# Patient Record
Sex: Male | Born: 1935
Health system: Southern US, Community
[De-identification: ages and names within clinical notes are randomized; demographics above are authoritative.]

## PROBLEM LIST (undated history)

## (undated) DIAGNOSIS — S065X9A Traumatic subdural hemorrhage with loss of consciousness of unspecified duration, initial encounter: Secondary | ICD-10-CM

## (undated) DIAGNOSIS — W19XXXA Unspecified fall, initial encounter: Secondary | ICD-10-CM

## (undated) DIAGNOSIS — K921 Melena: Secondary | ICD-10-CM

## (undated) DIAGNOSIS — C801 Malignant (primary) neoplasm, unspecified: Secondary | ICD-10-CM

## (undated) DIAGNOSIS — E039 Hypothyroidism, unspecified: Secondary | ICD-10-CM

## (undated) DIAGNOSIS — Z8546 Personal history of malignant neoplasm of prostate: Secondary | ICD-10-CM

## (undated) DIAGNOSIS — E785 Hyperlipidemia, unspecified: Secondary | ICD-10-CM

## (undated) DIAGNOSIS — H9190 Unspecified hearing loss, unspecified ear: Secondary | ICD-10-CM

## (undated) DIAGNOSIS — I10 Essential (primary) hypertension: Secondary | ICD-10-CM

## (undated) DIAGNOSIS — Z87442 Personal history of urinary calculi: Secondary | ICD-10-CM

## (undated) DIAGNOSIS — I2510 Atherosclerotic heart disease of native coronary artery without angina pectoris: Secondary | ICD-10-CM

## (undated) DIAGNOSIS — Z85828 Personal history of other malignant neoplasm of skin: Secondary | ICD-10-CM

## (undated) DIAGNOSIS — Z8551 Personal history of malignant neoplasm of bladder: Secondary | ICD-10-CM

## (undated) DIAGNOSIS — R0600 Dyspnea, unspecified: Secondary | ICD-10-CM

## (undated) DIAGNOSIS — D649 Anemia, unspecified: Secondary | ICD-10-CM

## (undated) DIAGNOSIS — K250 Acute gastric ulcer with hemorrhage: Secondary | ICD-10-CM

## (undated) HISTORY — DX: Anemia, unspecified: D64.9

## (undated) HISTORY — PX: MOHS SURGERY: SHX181

## (undated) HISTORY — DX: Personal history of malignant neoplasm of prostate: Z85.46

## (undated) HISTORY — DX: Hyperlipidemia, unspecified: E78.5

## (undated) HISTORY — PX: PROSTATE SURGERY: SHX751

## (undated) HISTORY — DX: Acute gastric ulcer with hemorrhage: K25.0

## (undated) HISTORY — DX: Personal history of other malignant neoplasm of skin: Z85.828

## (undated) HISTORY — DX: Essential (primary) hypertension: I10

## (undated) HISTORY — DX: Personal history of malignant neoplasm of bladder: Z85.51

## (undated) HISTORY — PX: HERNIA REPAIR: SHX51

## (undated) HISTORY — PX: CATARACT EXTRACTION: SUR2

## (undated) HISTORY — DX: Atherosclerotic heart disease of native coronary artery without angina pectoris: I25.10

## (undated) HISTORY — DX: Hypothyroidism, unspecified: E03.9

## (undated) HISTORY — DX: Melena: K92.1

## (undated) HISTORY — PX: CORONARY ARTERY BYPASS GRAFT: SHX141

---
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
## 2000-05-27 ENCOUNTER — Encounter (INDEPENDENT_AMBULATORY_CARE_PROVIDER_SITE_OTHER): Payer: Self-pay | Admitting: Specialist

## 2000-05-27 ENCOUNTER — Ambulatory Visit (HOSPITAL_BASED_OUTPATIENT_CLINIC_OR_DEPARTMENT_OTHER): Admission: RE | Admit: 2000-05-27 | Discharge: 2000-05-27 | Payer: Self-pay | Admitting: *Deleted

## 2000-10-27 ENCOUNTER — Ambulatory Visit (HOSPITAL_BASED_OUTPATIENT_CLINIC_OR_DEPARTMENT_OTHER): Admission: RE | Admit: 2000-10-27 | Discharge: 2000-10-27 | Payer: Self-pay | Admitting: *Deleted

## 2000-10-27 ENCOUNTER — Encounter (INDEPENDENT_AMBULATORY_CARE_PROVIDER_SITE_OTHER): Payer: Self-pay | Admitting: Specialist

## 2001-02-11 ENCOUNTER — Ambulatory Visit (HOSPITAL_BASED_OUTPATIENT_CLINIC_OR_DEPARTMENT_OTHER): Admission: RE | Admit: 2001-02-11 | Discharge: 2001-02-11 | Payer: Self-pay | Admitting: *Deleted

## 2001-02-11 ENCOUNTER — Encounter (INDEPENDENT_AMBULATORY_CARE_PROVIDER_SITE_OTHER): Payer: Self-pay | Admitting: Specialist

## 2004-09-30 ENCOUNTER — Ambulatory Visit: Payer: Self-pay | Admitting: Internal Medicine

## 2005-04-30 ENCOUNTER — Ambulatory Visit: Payer: Self-pay | Admitting: Cardiology

## 2005-06-24 ENCOUNTER — Ambulatory Visit: Payer: Self-pay | Admitting: Internal Medicine

## 2005-10-01 ENCOUNTER — Ambulatory Visit: Payer: Self-pay | Admitting: Internal Medicine

## 2005-10-06 ENCOUNTER — Ambulatory Visit: Payer: Self-pay | Admitting: Internal Medicine

## 2006-03-30 ENCOUNTER — Ambulatory Visit: Payer: Self-pay | Admitting: Internal Medicine

## 2006-04-15 ENCOUNTER — Ambulatory Visit: Payer: Self-pay | Admitting: Internal Medicine

## 2006-05-11 ENCOUNTER — Ambulatory Visit: Payer: Self-pay | Admitting: Cardiology

## 2006-05-20 ENCOUNTER — Ambulatory Visit: Payer: Self-pay

## 2006-06-09 ENCOUNTER — Ambulatory Visit: Payer: Self-pay | Admitting: Internal Medicine

## 2006-10-06 ENCOUNTER — Ambulatory Visit: Payer: Self-pay | Admitting: Internal Medicine

## 2006-10-06 LAB — CONVERTED CEMR LAB
ALT: 24 units/L (ref 0–40)
AST: 33 units/L (ref 0–37)
Albumin: 3.7 g/dL (ref 3.5–5.2)
Alkaline Phosphatase: 71 units/L (ref 39–117)
BUN: 15 mg/dL (ref 6–23)
Basophils Relative: 0.5 % (ref 0.0–1.0)
Chloride: 107 meq/L (ref 96–112)
Cholesterol: 121 mg/dL (ref 0–200)
Creatinine, Ser: 0.9 mg/dL (ref 0.4–1.5)
Eosinophils Absolute: 0.1 10*3/uL (ref 0.0–0.6)
Glucose, Bld: 106 mg/dL — ABNORMAL HIGH (ref 70–99)
HDL: 46 mg/dL (ref >39.0)
LDL Cholesterol: 58 mg/dL (ref 0–99)
MCHC: 34.3 g/dL (ref 30.0–36.0)
Platelets: 131 10*3/uL — ABNORMAL LOW (ref 150–400)
Potassium: 4.6 meq/L (ref 3.5–5.1)
RDW: 13 % (ref 11.5–14.6)
TSH: 6.46 microintl units/mL — ABNORMAL HIGH (ref 0.35–5.50)
Triglycerides: 87 mg/dL (ref 0–149)
VLDL: 17 mg/dL (ref 0–40)
WBC: 5.7 10*3/uL (ref 4.5–10.5)

## 2006-10-12 ENCOUNTER — Ambulatory Visit: Payer: Self-pay | Admitting: Internal Medicine

## 2007-01-26 ENCOUNTER — Encounter: Payer: Self-pay | Admitting: Internal Medicine

## 2007-01-26 DIAGNOSIS — E785 Hyperlipidemia, unspecified: Secondary | ICD-10-CM | POA: Insufficient documentation

## 2007-01-26 DIAGNOSIS — Z85828 Personal history of other malignant neoplasm of skin: Secondary | ICD-10-CM

## 2007-01-26 DIAGNOSIS — Z8546 Personal history of malignant neoplasm of prostate: Secondary | ICD-10-CM

## 2007-01-26 DIAGNOSIS — I2510 Atherosclerotic heart disease of native coronary artery without angina pectoris: Secondary | ICD-10-CM

## 2007-01-26 DIAGNOSIS — I10 Essential (primary) hypertension: Secondary | ICD-10-CM

## 2007-01-26 DIAGNOSIS — Z951 Presence of aortocoronary bypass graft: Secondary | ICD-10-CM | POA: Insufficient documentation

## 2007-01-26 HISTORY — DX: Personal history of malignant neoplasm of prostate: Z85.46

## 2007-01-26 HISTORY — DX: Essential (primary) hypertension: I10

## 2007-01-26 HISTORY — DX: Personal history of other malignant neoplasm of skin: Z85.828

## 2007-01-26 HISTORY — DX: Hyperlipidemia, unspecified: E78.5

## 2007-01-26 HISTORY — DX: Atherosclerotic heart disease of native coronary artery without angina pectoris: I25.10

## 2007-05-24 ENCOUNTER — Ambulatory Visit: Payer: Self-pay | Admitting: Cardiology

## 2007-05-27 ENCOUNTER — Ambulatory Visit: Payer: Self-pay | Admitting: Internal Medicine

## 2007-10-12 ENCOUNTER — Ambulatory Visit: Payer: Self-pay | Admitting: Internal Medicine

## 2007-10-12 DIAGNOSIS — N390 Urinary tract infection, site not specified: Secondary | ICD-10-CM | POA: Insufficient documentation

## 2007-10-12 LAB — CONVERTED CEMR LAB
ALT: 22 units/L (ref 0–53)
Alkaline Phosphatase: 68 units/L (ref 39–117)
BUN: 16 mg/dL (ref 6–23)
Basophils Absolute: 0 10*3/uL (ref 0.0–0.1)
Bilirubin Urine: NEGATIVE
CO2: 31 meq/L (ref 19–32)
Chloride: 109 meq/L (ref 96–112)
Cholesterol: 132 mg/dL (ref 0–200)
Eosinophils Relative: 1.3 % (ref 0.0–5.0)
GFR calc Af Amer: 92 mL/min
Glucose, Bld: 92 mg/dL (ref 70–99)
Glucose, Urine, Semiquant: NEGATIVE
Lymphocytes Relative: 16 % (ref 12.0–46.0)
Monocytes Relative: 9.4 % (ref 3.0–11.0)
Neutro Abs: 4.8 10*3/uL (ref 1.4–7.7)
Nitrite: NEGATIVE
PSA: 5.77 ng/mL — ABNORMAL HIGH (ref 0.10–4.00)
Platelets: 162 10*3/uL (ref 150–400)
RDW: 13.1 % (ref 11.5–14.6)
Sodium: 144 meq/L (ref 135–145)
Triglycerides: 84 mg/dL (ref 0–149)
VLDL: 17 mg/dL (ref 0–40)
WBC Urine, dipstick: NEGATIVE
WBC: 6.6 10*3/uL (ref 4.5–10.5)
pH: 5.5

## 2007-10-18 ENCOUNTER — Ambulatory Visit: Payer: Self-pay | Admitting: Internal Medicine

## 2007-10-18 DIAGNOSIS — E039 Hypothyroidism, unspecified: Secondary | ICD-10-CM

## 2007-10-18 HISTORY — DX: Hypothyroidism, unspecified: E03.9

## 2007-12-09 ENCOUNTER — Encounter: Payer: Self-pay | Admitting: Internal Medicine

## 2007-12-16 ENCOUNTER — Encounter (HOSPITAL_COMMUNITY): Admission: RE | Admit: 2007-12-16 | Discharge: 2008-03-15 | Payer: Self-pay | Admitting: Urology

## 2007-12-29 ENCOUNTER — Encounter: Payer: Self-pay | Admitting: Internal Medicine

## 2008-01-17 ENCOUNTER — Ambulatory Visit: Payer: Self-pay | Admitting: Internal Medicine

## 2008-01-20 ENCOUNTER — Telehealth: Payer: Self-pay | Admitting: Internal Medicine

## 2008-05-25 ENCOUNTER — Ambulatory Visit: Payer: Self-pay | Admitting: Internal Medicine

## 2008-05-29 ENCOUNTER — Ambulatory Visit: Payer: Self-pay | Admitting: Cardiology

## 2008-07-03 ENCOUNTER — Ambulatory Visit (HOSPITAL_COMMUNITY): Admission: RE | Admit: 2008-07-03 | Discharge: 2008-07-03 | Payer: Self-pay | Admitting: Urology

## 2008-07-10 ENCOUNTER — Encounter: Payer: Self-pay | Admitting: Internal Medicine

## 2008-08-28 ENCOUNTER — Encounter: Payer: Self-pay | Admitting: Internal Medicine

## 2008-09-24 ENCOUNTER — Encounter: Payer: Self-pay | Admitting: Internal Medicine

## 2008-10-18 ENCOUNTER — Ambulatory Visit: Payer: Self-pay | Admitting: Internal Medicine

## 2008-10-18 LAB — CONVERTED CEMR LAB
Alkaline Phosphatase: 89 units/L (ref 39–117)
Basophils Absolute: 0 10*3/uL (ref 0.0–0.1)
Basophils Relative: 0.2 % (ref 0.0–3.0)
Calcium: 9.3 mg/dL (ref 8.4–10.5)
Chloride: 109 meq/L (ref 96–112)
Cholesterol: 123 mg/dL (ref 0–200)
Eosinophils Absolute: 0.1 10*3/uL (ref 0.0–0.7)
Glucose, Bld: 101 mg/dL — ABNORMAL HIGH (ref 70–99)
Glucose, Urine, Semiquant: NEGATIVE
HCT: 45.3 % (ref 39.0–52.0)
MCHC: 33.3 g/dL (ref 30.0–36.0)
MCV: 92.9 fL (ref 78.0–100.0)
Monocytes Absolute: 0.7 10*3/uL (ref 0.1–1.0)
Neutrophils Relative %: 68.6 % (ref 43.0–77.0)
Nitrite: NEGATIVE
PSA: 7.7 ng/mL — ABNORMAL HIGH (ref 0.10–4.00)
RBC: 4.88 M/uL (ref 4.22–5.81)
RDW: 13.2 % (ref 11.5–14.6)
Total Protein: 6.2 g/dL (ref 6.0–8.3)
VLDL: 15 mg/dL (ref 0–40)
pH: 5

## 2008-10-23 ENCOUNTER — Ambulatory Visit: Payer: Self-pay | Admitting: Internal Medicine

## 2008-11-13 ENCOUNTER — Encounter: Payer: Self-pay | Admitting: Internal Medicine

## 2008-11-19 ENCOUNTER — Encounter: Payer: Self-pay | Admitting: Internal Medicine

## 2008-12-19 ENCOUNTER — Encounter: Payer: Self-pay | Admitting: Internal Medicine

## 2009-05-21 ENCOUNTER — Ambulatory Visit: Payer: Self-pay | Admitting: Internal Medicine

## 2009-06-06 ENCOUNTER — Ambulatory Visit: Payer: Self-pay | Admitting: Cardiology

## 2009-06-10 ENCOUNTER — Telehealth (INDEPENDENT_AMBULATORY_CARE_PROVIDER_SITE_OTHER): Payer: Self-pay

## 2009-06-11 ENCOUNTER — Encounter (HOSPITAL_COMMUNITY): Admission: RE | Admit: 2009-06-11 | Discharge: 2009-08-23 | Payer: Self-pay | Admitting: Internal Medicine

## 2009-06-11 ENCOUNTER — Ambulatory Visit: Payer: Self-pay | Admitting: Internal Medicine

## 2009-06-11 ENCOUNTER — Ambulatory Visit: Payer: Self-pay

## 2009-08-28 ENCOUNTER — Ambulatory Visit: Payer: Self-pay | Admitting: Internal Medicine

## 2009-08-28 ENCOUNTER — Inpatient Hospital Stay (HOSPITAL_COMMUNITY): Admission: EM | Admit: 2009-08-28 | Discharge: 2009-08-30 | Payer: Self-pay | Admitting: Emergency Medicine

## 2009-08-28 ENCOUNTER — Ambulatory Visit: Payer: Self-pay | Admitting: Gastroenterology

## 2009-08-28 DIAGNOSIS — K921 Melena: Secondary | ICD-10-CM | POA: Insufficient documentation

## 2009-08-29 ENCOUNTER — Encounter (INDEPENDENT_AMBULATORY_CARE_PROVIDER_SITE_OTHER): Payer: Self-pay | Admitting: Internal Medicine

## 2009-08-29 ENCOUNTER — Encounter: Payer: Self-pay | Admitting: Internal Medicine

## 2009-09-03 ENCOUNTER — Encounter: Payer: Self-pay | Admitting: Internal Medicine

## 2009-09-10 ENCOUNTER — Ambulatory Visit: Payer: Self-pay | Admitting: Internal Medicine

## 2009-09-10 DIAGNOSIS — K250 Acute gastric ulcer with hemorrhage: Secondary | ICD-10-CM

## 2009-09-10 HISTORY — DX: Acute gastric ulcer with hemorrhage: K25.0

## 2009-09-10 LAB — CONVERTED CEMR LAB
Basophils Relative: 0.2 % (ref 0.0–3.0)
HCT: 36.2 % — ABNORMAL LOW (ref 39.0–52.0)
Hemoglobin: 11.6 g/dL — ABNORMAL LOW (ref 13.0–17.0)
MCHC: 32.2 g/dL (ref 30.0–36.0)
MCV: 96.9 fL (ref 78.0–100.0)
Monocytes Relative: 8.3 % (ref 3.0–12.0)
Neutro Abs: 5.2 10*3/uL (ref 1.4–7.7)
WBC: 7 10*3/uL (ref 4.5–10.5)

## 2009-10-08 ENCOUNTER — Ambulatory Visit: Payer: Self-pay | Admitting: Internal Medicine

## 2009-10-08 DIAGNOSIS — D649 Anemia, unspecified: Secondary | ICD-10-CM | POA: Insufficient documentation

## 2009-10-08 HISTORY — DX: Anemia, unspecified: D64.9

## 2009-10-08 LAB — CONVERTED CEMR LAB
Basophils Absolute: 0 10*3/uL (ref 0.0–0.1)
Eosinophils Absolute: 0 10*3/uL (ref 0.0–0.7)
Hemoglobin: 13.3 g/dL (ref 13.0–17.0)
Lymphocytes Relative: 18.4 % (ref 12.0–46.0)
MCV: 95.7 fL (ref 78.0–100.0)
Monocytes Absolute: 0.6 10*3/uL (ref 0.1–1.0)
Monocytes Relative: 8.9 % (ref 3.0–12.0)
Neutro Abs: 4.8 10*3/uL (ref 1.4–7.7)
Neutrophils Relative %: 71.4 % (ref 43.0–77.0)
RDW: 13 % (ref 11.5–14.6)
WBC: 6.6 10*3/uL (ref 4.5–10.5)

## 2009-10-22 ENCOUNTER — Ambulatory Visit: Payer: Self-pay | Admitting: Internal Medicine

## 2009-10-22 LAB — CONVERTED CEMR LAB
ALT: 19 units/L (ref 0–53)
AST: 27 units/L (ref 0–37)
Albumin: 3.9 g/dL (ref 3.5–5.2)
Alkaline Phosphatase: 75 units/L (ref 39–117)
Bilirubin Urine: NEGATIVE
Calcium: 9.5 mg/dL (ref 8.4–10.5)
Creatinine, Ser: 1 mg/dL (ref 0.4–1.5)
GFR calc non Af Amer: 75.82 mL/min (ref >60)
Glucose, Bld: 95 mg/dL (ref 70–99)
Hemoglobin: 13.7 g/dL (ref 13.0–17.0)
Neutro Abs: 3.9 10*3/uL (ref 1.4–7.7)
Neutrophils Relative %: 68 % (ref 43.0–77.0)
RDW: 13 % (ref 11.5–14.6)
Sodium: 145 meq/L (ref 135–145)
Specific Gravity, Urine: 1.025
Triglycerides: 101 mg/dL (ref 0.0–149.0)
VLDL: 20.2 mg/dL (ref 0.0–40.0)
WBC Urine, dipstick: NEGATIVE
WBC: 5.7 10*3/uL (ref 4.5–10.5)
pH: 5

## 2009-10-29 ENCOUNTER — Ambulatory Visit: Payer: Self-pay | Admitting: Internal Medicine

## 2010-04-10 ENCOUNTER — Ambulatory Visit: Payer: Self-pay | Admitting: Internal Medicine

## 2010-05-27 ENCOUNTER — Encounter: Payer: Self-pay | Admitting: Internal Medicine

## 2010-06-10 ENCOUNTER — Encounter: Payer: Self-pay | Admitting: Cardiology

## 2010-06-12 ENCOUNTER — Ambulatory Visit: Payer: Self-pay | Admitting: Cardiology

## 2010-06-12 ENCOUNTER — Encounter: Payer: Self-pay | Admitting: Cardiology

## 2010-07-08 ENCOUNTER — Ambulatory Visit: Payer: Self-pay | Admitting: Internal Medicine

## 2010-09-23 NOTE — Procedures (Signed)
Summary: Upper Endoscopy  Patient: [name] Note: All result statuses are Final unless otherwise noted.  Tests: (1) Upper Endoscopy (EGD)   EGD Upper Endoscopy       DONE     [facility]     [address]     [place], [place]  [address]           ENDOSCOPY PROCEDURE REPORT           PATIENT:  Raymond Swanson, Raymond Swanson  MR#:  [id number]     BIRTHDATE:  07-12-1925, 84 yrs. old  GENDER:  male           ENDOSCOPIST:  Iva Boop, MD, [employer]     Referred by:           Hospitalist           PROCEDURE DATE:  08/29/2009     PROCEDURE:  EGD with biopsy     ASA CLASS:  Class III     INDICATIONS:  melena/GI bleed Hgb 9           MEDICATIONS:   Fentanyl 25 mcg IV, Versed 2 mg IV     TOPICAL ANESTHETIC:  Cetacaine Spray           DESCRIPTION OF PROCEDURE:   After the risks benefits and     alternatives of the procedure were thoroughly explained, informed     consent was obtained.  The EG-[id number] ([id number]) endoscope was     introduced through the mouth and advanced to the second portion of     the duodenum, without limitations.  The instrument was slowly     withdrawn as the mucosa was fully examined.     <<PROCEDUREIMAGES>>           An ulcer was found in the antrum. It was clean based. It was 1 cm     in size. On the incisura. Multiple biopsies were obtained and sent     to pathology.  Multiple erosions were found in the antrum.     Otherwise the examination was normal.    Retroflexed views revealed     no abnormalities.    The scope was then withdrawn from the patient     and the procedure completed.           COMPLICATIONS:  None           ENDOSCOPIC IMPRESSION:     1) 1 cm ulcer in the antrum - biopsied     2) Erosions, multiple in the antrum     3) Otherwise normal examination           Overall picture consistent with ulcer from ASA (taking 325 mg     daily)           RECOMMENDATIONS:     1) await biopsy results     2) hold aspirin for at least 2( defer to  PCP for 2 vs. 4 weeks)     then restart at 81 mg daily     3) PPI daily forever would prescribe 40 mg omeprazole daily at     dc as most likely to be covered by insurance     4) ferrous sulfate 325 mg bid     5) PCP follow-up Amador Cunas) 1-2 weeks after discharge (dc     tomorrow unless other problems), will need Hgb follwed until     normal     6) I will follow-up  biopsies and coordinate any further GI care,     if needed           REPEAT EXAM:  as needed - not definitely indicated, will review     biopsies           Iva Boop, MD, Clementeen Graham           CC:  Claudette Head, MD     Gordy Savers, MD     The Patient           n.     eSIGNED:   Iva Boop at 08/29/2009 04:22 PM           Rudi Rummage, [id number]  Note: An exclamation mark (!) indicates a result that was not dispersed into the flowsheet. Document Creation Date: 08/29/2009 4:22 PM _______________________________________________________________________  (1) Order result status: Final Collection or observation date-time: 08/29/2009 16:05 Requested date-time:  Receipt date-time:  Reported date-time:  Referring Physician:   Ordering Physician: Stan Head [id number]) Specimen Source:  Source: Launa Grill Order Number: [id number] Lab site:

## 2010-09-23 NOTE — Assessment & Plan Note (Signed)
Summary: CPX/CJR   Vital Signs:  Patient profile:   77 year old male Height:      66.5 inches Weight:      147 pounds BMI:     23.46 Temp:     97.4 degrees F oral BP sitting:   118 / 70  (right arm) Cuff size:   regular  Vitals Entered By: Duard Brady LPN (October 29, 1608 9:30 AM) CC: cpx - doing well - has cardiologist Is Patient Diabetic? No   Primary Care Provider:  Gordy Savers  MD  CC:  cpx - doing well - has cardiologist.  History of Present Illness: 77 year old patient who is seen today for follow-up.  he was hospitalized two months ago for upper GI bleeding secondary to a gastric ulcer.  Since his discharge he has done well.  Recent lab follow-up revealed resolution of his anemia.  His aspirin  therapy has been held for the past two months.  he is followed by cardiology and has had a fairly recent Cardiolite stress test.  He has hypertension, dyslipidemia, and hypothyroidism.  He has a history of prostate cancer, status post prostatectomy in 1990 and an increasing PSA.  He is scheduled for urology follow-up in one month  Preventive Screening-Counseling & Management  Alcohol-Tobacco     Smoking Status: never  Allergies (verified): No Known Drug Allergies  Past History:  Past Medical History: Reviewed history from 09/10/2009 and no changes required. Prostate cancer, hx of- history of   increasing PSAs Skin cancer, hx of Mohs L ear Coronary artery disease status post CABG (LIMA to    the LAD, SVG to the right coronary artery, and SVG to obtuse marginal in    1991) Hyperlipidemia Hypertension Hypothyroidism History of diplopia 2009 history of gastric ulcer, secondary to aspirin, January 2011  Past Surgical History: Coronary artery bypass graft 91 Inguinal herniorrhaphy 94 Prostatectomy 1990 MOHS  left ear 8/07, left cheek [facility] March 2010 Cataract extraction Sigmoidoscopy 1999 Cardiolite  stress test September 2007, October 2010 upper  panendoscopy January 2011  Family History: Reviewed history from 10/18/2007 and no changes required. father died age 44, pancreatic cancer mother died age 16  One brother died age 42 lung cancer 4 sisters  Social History: Reviewed history from 10/23/2008 and no changes required. Retired Married remote tobacco useSmoking Status:  never  Review of Systems  The patient denies anorexia, fever, weight loss, weight gain, vision loss, decreased hearing, hoarseness, chest pain, syncope, dyspnea on exertion, peripheral edema, prolonged cough, headaches, hemoptysis, abdominal pain, melena, hematochezia, severe indigestion/heartburn, hematuria, incontinence, genital sores, muscle weakness, suspicious skin lesions, transient blindness, difficulty walking, depression, unusual weight change, abnormal bleeding, enlarged lymph nodes, angioedema, breast masses, and testicular masses.    Physical Exam  General:  thin alert, no distress.  Blood pressure 126/76 Head:  Normocephalic and atraumatic without obvious abnormalities. No apparent alopecia or balding. Eyes:  No corneal or conjunctival inflammation noted. EOMI. Perrla. Funduscopic exam benign, without hemorrhages, exudates or papilledema. Vision grossly normal. Ears:  hearing aids in place bilaterally Nose:  External nasal examination shows no deformity or inflammation. Nasal mucosa are pink and moist without lesions or exudates. Mouth:  Oral mucosa and oropharynx without lesions or exudates.  Teeth in good repair. Neck:  No deformities, masses, or tenderness noted. slightly diminished left carotid upstroke Chest Wall:  No deformities, masses, tenderness or gynecomastia noted. sternotomy scar Breasts:  No masses or gynecomastia noted Lungs:  Normal respiratory effort, chest expands  symmetrically. Lungs are clear to auscultation, no crackles or wheezes. Heart:  Normal rate and regular rhythm. S1 and S2 normal without gallop, murmur, click, rub or  other extra sounds. Abdomen:  Bowel sounds positive,abdomen soft and non-tender without masses, organomegaly or hernias noted. lower midline surgical scar Genitalia:  Testes bilaterally descended without nodularity, tenderness or masses. No scrotal masses or lesions. No penis lesions or urethral discharge. Msk:  No deformity or scoliosis noted of thoracic or lumbar spine.   Pulses:  pedal pulses intact Extremities:  No clubbing, cyanosis, edema, or deformity noted with normal full range of motion of all joints.   Neurologic:  alert & oriented X3, cranial nerves II-XII intact, strength normal in all extremities, sensation intact to pinprick, gait normal, and DTRs symmetrical and normal.  alert & oriented X3, cranial nerves II-XII intact, strength normal in all extremities, sensation intact to pinprick, gait normal, and DTRs symmetrical and normal.   Skin:  Intact without suspicious lesions or rashes Cervical Nodes:  No lymphadenopathy noted Axillary Nodes:  No palpable lymphadenopathy Inguinal Nodes:  No significant adenopathy Psych:  Cognition and judgment appear intact. Alert and cooperative with normal attention span and concentration. No apparent delusions, illusions, hallucinations   Impression & Recommendations:  Problem # 1:  HYPOTHYROIDISM (ICD-244.9)  His updated medication list for this problem includes:    Levothroid 50 Mcg Tabs (Levothyroxine sodium) ..... One daily  His updated medication list for this problem includes:    Levothroid 50 Mcg Tabs (Levothyroxine sodium) ..... One daily  Problem # 2:  HYPERTENSION (ICD-401.9)  Problem # 3:  CORONARY ARTERY DISEASE (ICD-414.00)  The following medications were removed from the medication list:    Bayer Aspirin 325 Mg Tabs (Aspirin) .Marland Kitchen... Take 1 tablet by mouth once a day His updated medication list for this problem includes:    Nitrostat 0.4 Mg Subl (Nitroglycerin) .Marland Kitchen... 1 for chest pain prn    Aspir-low 81 Mg Tbec (Aspirin)  ..... One daily    The following medications were removed from the medication list:    Bayer Aspirin 325 Mg Tabs (Aspirin) .Marland Kitchen... Take 1 tablet by mouth once a day His updated medication list for this problem includes:    Nitrostat 0.4 Mg Subl (Nitroglycerin) .Marland Kitchen... 1 for chest pain prn    Aspir-low 81 Mg Tbec (Aspirin) ..... One daily  Orders: Prescription Created Electronically [id number])  Problem # 4:  PROSTATE CANCER, HX OF (ICD-V10.46) followed urology next month  Complete Medication List: 1)  Vytorin 10-20 Mg Tabs (Ezetimibe-simvastatin) .... Take 1 tablet by mouth once a day 2)  Levothroid 50 Mcg Tabs (Levothyroxine sodium) .... One daily 3)  Nitrostat 0.4 Mg Subl (Nitroglycerin) .Marland Kitchen.. 1 for chest pain prn 4)  Preservision  .... Daily 5)  Multivitamins Tabs (Multiple vitamin) .... Daily 6)  Calcium 500 Mg Tabs (Calcium carbonate) .... Daily 7)  Tylenol Pm Extra Strength 500-25 Mg Tabs (Diphenhydramine-apap (sleep)) .... As needed 8)  Prilosec 40 Mg Cpdr (Omeprazole) .Marland Kitchen.. 1 once daily 9)  Ferrous Sulfate 325 (65 Fe) Mg Tabs (Ferrous sulfate) .Marland Kitchen.. 1 two times a day 10)  Lunesta 2 Mg Tabs (Eszopiclone) .... One at bedtime as needed for sleep 11)  Aspir-low 81 Mg Tbec (Aspirin) .... One daily  Patient Instructions: 1)  Please schedule a follow-up appointment in 6 months. 2)  Limit your Sodium (Salt). 3)  It is important that you exercise regularly at least 20 minutes 5 times a week. If you develop chest pain,  have severe difficulty breathing, or feel very tired , stop exercising immediately and seek medical attention. Prescriptions: LEVOTHROID 50 MCG  TABS (LEVOTHYROXINE SODIUM) one daily  #90 x 6   Entered and Authorized by:   Gordy Savers  MD   Signed by:   Gordy Savers  MD on 10/29/2009   Method used:   Electronically to        [facility]  [address] [id number]* (retail)       [address]       [place], [place]  [address]       Ph:  [phone] or [phone]       Fax: [phone]   RxID:   [id number] VYTORIN 10-20 MG TABS (EZETIMIBE-SIMVASTATIN) Take 1 tablet by mouth once a day  #90 x 6   Entered and Authorized by:   Gordy Savers  MD   Signed by:   Gordy Savers  MD on 10/29/2009   Method used:   Electronically to        [facility]  [address] [id number]* (retail)       [address]       [place], [place]  [address]       Ph: [phone] or [phone]       Fax: [phone]   RxID:   [id number]

## 2010-09-23 NOTE — Assessment & Plan Note (Signed)
Summary: 6 month rov/njr PT RSC/NJR   Vital Signs:  Patient profile:   77 year old male Weight:      147 pounds Temp:     97.7 degrees F oral BP sitting:   160 / 62  (right arm) Cuff size:   regular  Vitals Entered By: Kathrynn Speed CMA (April 10, 2010 10:30 AM) CC: 6 month rov, Insurance Company wants to change his meds, lower back ache in morning then goes away,src   Primary Care Provider:  Gordy Savers  MD  CC:  6 month rov, Insurance Company wants to change his meds, lower back ache in morning then goes away, and src.  History of Present Illness: an 77 year old patient who is seen today for follow-up.  He has a history of coronary artery disease, prostate cancer, and dyslipidemia.  He is followed closely by urology.  He denies any cardiac symptoms.  His last cholesterol was 125.  His last PSA here was 10.  He is scheduled to see urology later this month.  Current Medications (verified): 1)  Vytorin 10-20 Mg Tabs (Ezetimibe-Simvastatin) .... Take 1 Tablet By Mouth Once A Day 2)  Levothroid 50 Mcg  Tabs (Levothyroxine Sodium) .... One Daily 3)  Nitrostat 0.4 Mg  Subl (Nitroglycerin) .Marland Kitchen.. 1 For Chest Pain Prn 4)  Preservision .... Daily 5)  Multivitamins   Tabs (Multiple Vitamin) .... Daily 6)  Calcium 500 Mg Tabs (Calcium Carbonate) .... Daily 7)  Tylenol Pm Extra Strength 500-25 Mg Tabs (Diphenhydramine-Apap (Sleep)) .... As Needed 8)  Prilosec 40 Mg Cpdr (Omeprazole) .Marland Kitchen.. 1 Once Daily 9)  Ferrous Sulfate 325 (65 Fe) Mg Tabs (Ferrous Sulfate) .Marland Kitchen.. 1 Two Times A Day 10)  Lunesta 2 Mg Tabs (Eszopiclone) .... One At Bedtime As Needed For Sleep 11)  Aspir-Low 81 Mg Tbec (Aspirin) .... One Daily  Allergies (verified): No Known Drug Allergies  Past History:  Past Medical History: Reviewed history from 09/10/2009 and no changes required. Prostate cancer, hx of- history of   increasing PSAs Skin cancer, hx of Mohs L ear Coronary artery disease status post CABG (LIMA to     the LAD, SVG to the right coronary artery, and SVG to obtuse marginal in    1991) Hyperlipidemia Hypertension Hypothyroidism History of diplopia 2009 history of gastric ulcer, secondary to aspirin, January 2011  Past Surgical History: Reviewed history from 10/29/2009 and no changes required. Coronary artery bypass graft 91 Inguinal herniorrhaphy 94 Prostatectomy 1990 MOHS  left ear 8/07, left cheek [facility] March 2010 Cataract extraction Sigmoidoscopy 1999 Cardiolite  stress test September 2007, October 2010 upper panendoscopy January 2011  Review of Systems  The patient denies anorexia, fever, weight loss, weight gain, vision loss, decreased hearing, hoarseness, chest pain, syncope, dyspnea on exertion, peripheral edema, prolonged cough, headaches, hemoptysis, abdominal pain, melena, hematochezia, severe indigestion/heartburn, hematuria, incontinence, genital sores, muscle weakness, suspicious skin lesions, transient blindness, difficulty walking, depression, unusual weight change, abnormal bleeding, enlarged lymph nodes, angioedema, breast masses, and testicular masses.    Physical Exam  General:  Well-developed,well-nourished,in no acute distress; alert,appropriate and cooperative throughout examination; 150/70 Head:  Normocephalic and atraumatic without obvious abnormalities. No apparent alopecia or balding. Eyes:  No corneal or conjunctival inflammation noted. EOMI. Perrla. Funduscopic exam benign, without hemorrhages, exudates or papilledema. Vision grossly normal. Ears:  External ear exam shows no significant lesions or deformities.  Otoscopic examination reveals clear canals, tympanic membranes are intact bilaterally without bulging, retraction, inflammation or discharge. Hearing is grossly normal bilaterally.  Mouth:  Oral mucosa and oropharynx without lesions or exudates.  Teeth in good repair. Neck:  No deformities, masses, or tenderness noted. Lungs:  Normal respiratory  effort, chest expands symmetrically. Lungs are clear to auscultation, no crackles or wheezes. Heart:  Normal rate and regular rhythm. S1 and S2 normal without gallop, murmur, click, rub or other extra sounds. Abdomen:  Bowel sounds positive,abdomen soft and non-tender without masses, organomegaly or hernias noted. Msk:  No deformity or scoliosis noted of thoracic or lumbar spine.   Pulses:  R and L carotid,radial,femoral,dorsalis pedis and posterior tibial pulses are full and equal bilaterally Skin:  Intact without suspicious lesions or rashes Cervical Nodes:  No lymphadenopathy noted   Impression & Recommendations:  Problem # 1:  HYPERTENSION (ICD-401.9)  Problem # 2:  HYPERLIPIDEMIA (ICD-272.4)  The following medications were removed from the medication list:    Vytorin 10-20 Mg Tabs (Ezetimibe-simvastatin) .Marland Kitchen... Take 1 tablet by mouth once a day His updated medication list for this problem includes:    Simvastatin 20 Mg Tabs (Simvastatin) ..... One daily  The following medications were removed from the medication list:    Vytorin 10-20 Mg Tabs (Ezetimibe-simvastatin) .Marland Kitchen... Take 1 tablet by mouth once a day His updated medication list for this problem includes:    Simvastatin 20 Mg Tabs (Simvastatin) ..... One daily  Problem # 3:  CORONARY ARTERY DISEASE (ICD-414.00)  His updated medication list for this problem includes:    Nitrostat 0.4 Mg Subl (Nitroglycerin) .Marland Kitchen... 1 for chest pain prn    Aspir-low 81 Mg Tbec (Aspirin) ..... One daily  His updated medication list for this problem includes:    Nitrostat 0.4 Mg Subl (Nitroglycerin) .Marland Kitchen... 1 for chest pain prn    Aspir-low 81 Mg Tbec (Aspirin) ..... One daily  Problem # 4:  PROSTATE CANCER, HX OF (ICD-V10.46) urology follow-up.  This month  Complete Medication List: 1)  Levothroid 50 Mcg Tabs (Levothyroxine sodium) .... One daily 2)  Nitrostat 0.4 Mg Subl (Nitroglycerin) .Marland Kitchen.. 1 for chest pain prn 3)  Preservision  ....  Daily 4)  Multivitamins Tabs (Multiple vitamin) .... Daily 5)  Calcium 500 Mg Tabs (Calcium carbonate) .... Daily 6)  Tylenol Pm Extra Strength 500-25 Mg Tabs (Diphenhydramine-apap (sleep)) .... As needed 7)  Prilosec 40 Mg Cpdr (Omeprazole) .Marland Kitchen.. 1 once daily 8)  Ferrous Sulfate 325 (65 Fe) Mg Tabs (Ferrous sulfate) .Marland Kitchen.. 1 two times a day 9)  Lunesta 2 Mg Tabs (Eszopiclone) .... One at bedtime as needed for sleep 10)  Aspir-low 81 Mg Tbec (Aspirin) .... One daily 11)  Simvastatin 20 Mg Tabs (Simvastatin) .... One daily  Patient Instructions: 1)  Please schedule a follow-up appointment in 3 months. 2)  Limit your Sodium (Salt) to less than 2 grams a day(slightly less than 1/2 a teaspoon) to prevent fluid retention, swelling, or worsening of symptoms. 3)  urology follow-up as scheduled Prescriptions: SIMVASTATIN 20 MG TABS (SIMVASTATIN) one daily  #90 x 4   Entered and Authorized by:   Gordy Savers  MD   Signed by:   Gordy Savers  MD on 04/10/2010   Method used:   Electronically to        [facility]  [address] [id number]* (retail)       [address]       [place], [place]  [address]       Ph: [phone] or [phone]  Fax: [phone]   RxID:   [id number] LEVOTHROID 50 MCG  TABS (LEVOTHYROXINE SODIUM) one daily  #90 x 6   Entered and Authorized by:   Gordy Savers  MD   Signed by:   Gordy Savers  MD on 04/10/2010   Method used:   Electronically to        [facility]  [address] [id number]* (retail)       [address]       [place], [place]  [address]       Ph: [phone] or [phone]       Fax: [phone]   RxID:   [id number]

## 2010-09-23 NOTE — Assessment & Plan Note (Signed)
Summary: 3 month rov/njr   Vital Signs:  Patient profile:   77 year old male Weight:      147 pounds Temp:     97.8 degrees F oral BP sitting:   100 / 70  (right arm) Cuff size:   regular  Vitals Entered By: Duard Brady LPN (July 08, 2010 11:39 AM) CC: 3 mos rov - doing well Is Patient Diabetic? No   Primary Care Provider:  Gordy Savers  MD  CC:  3 mos rov - doing well.  History of Present Illness: 77 -year-old.  He is followed by cardiology with coronary artery disease.  This has been stable.  He denies any exertional chest pain.  Denies any dyspnea on exertion or any symptoms of heart failure.  He has treated hypertension and dyslipidemia.  Remains on simvastatin 20 mg daily, which he continues to tolerate well.  His weight has been stable at 147.  He has treated hypothyroidism  Allergies (verified): No Known Drug Allergies  Past History:  Past Medical History: Reviewed history from 09/10/2009 and no changes required. Prostate cancer, hx of- history of   increasing PSAs Skin cancer, hx of Mohs L ear Coronary artery disease status post CABG (LIMA to    the LAD, SVG to the right coronary artery, and SVG to obtuse marginal in    1991) Hyperlipidemia Hypertension Hypothyroidism History of diplopia 2009 history of gastric ulcer, secondary to aspirin, January 2011  Past Surgical History: Reviewed history from 06/12/2010 and no changes required. Coronary artery bypass graft 91 Inguinal herniorrhaphy 94 Prostatectomy 1990 MOHS  left ear 8/07, left cheek [facility] March 2010 Cataract extraction Sigmoidoscopy 1999 Cardiolite  stress test September 2007, October 2010 Upper panendoscopy/GI bleed January 2011  Review of Systems  The patient denies anorexia, fever, weight loss, weight gain, vision loss, decreased hearing, hoarseness, chest pain, syncope, dyspnea on exertion, peripheral edema, prolonged cough, headaches, hemoptysis, abdominal pain, melena,  hematochezia, severe indigestion/heartburn, hematuria, incontinence, genital sores, muscle weakness, suspicious skin lesions, transient blindness, difficulty walking, depression, unusual weight change, abnormal bleeding, enlarged lymph nodes, angioedema, breast masses, and testicular masses.    Physical Exam  General:  Well-developed,well-nourished,in no acute distress; alert,appropriate and cooperative throughout examination;   110/70 Head:  Normocephalic and atraumatic without obvious abnormalities. No apparent alopecia or balding. Eyes:  No corneal or conjunctival inflammation noted. EOMI. Perrla. Funduscopic exam benign, without hemorrhages, exudates or papilledema. Vision grossly normal. Mouth:  Oral mucosa and oropharynx without lesions or exudates.  Teeth in good repair. Neck:  No deformities, masses, or tenderness noted. Lungs:  Normal respiratory effort, chest expands symmetrically. Lungs are clear to auscultation, no crackles or wheezes. Heart:  frequent ectopics Abdomen:  Bowel sounds positive,abdomen soft and non-tender without masses, organomegaly or hernias noted. Msk:  No deformity or scoliosis noted of thoracic or lumbar spine.   Extremities:  No clubbing, cyanosis, edema, or deformity noted with normal full range of motion of all joints.   Skin:  Intact without suspicious lesions or rashes Cervical Nodes:  No lymphadenopathy noted   Impression & Recommendations:  Problem # 1:  HYPERTENSION (ICD-401.9)  Problem # 2:  HYPERLIPIDEMIA (ICD-272.4)  The following medications were removed from the medication list:    Vytorin 10-20 Mg Tabs (Ezetimibe-simvastatin) His updated medication list for this problem includes:    Simvastatin 20 Mg Tabs (Simvastatin) ..... One daily  Problem # 3:  CORONARY ARTERY DISEASE (ICD-414.00)  His updated medication list for this problem includes:  Nitrostat 0.4 Mg Subl (Nitroglycerin) .Marland Kitchen... 1 for chest pain prn    Aspir-low 81 Mg Tbec  (Aspirin) ..... One daily  Complete Medication List: 1)  Levothroid 50 Mcg Tabs (Levothyroxine sodium) .... One daily 2)  Nitrostat 0.4 Mg Subl (Nitroglycerin) .Marland Kitchen.. 1 for chest pain prn 3)  Preservision  .... Daily 4)  Multivitamins Tabs (Multiple vitamin) .... Daily 5)  Calcium 500 Mg Tabs (Calcium carbonate) .... Daily 6)  Tylenol Pm Extra Strength 500-25 Mg Tabs (Diphenhydramine-apap (sleep)) .... As needed 7)  Lunesta 2 Mg Tabs (Eszopiclone) .... One at bedtime as needed for sleep 8)  Aspir-low 81 Mg Tbec (Aspirin) .... One daily 9)  Simvastatin 20 Mg Tabs (Simvastatin) .... One daily  Patient Instructions: 1)  Please schedule a follow-up appointment in 6 months for CPX  2)  Advised not to eat any food or drink any liquids after 10 PM the night before your procedure. 3)  Limit your Sodium (Salt). 4)  It is important that you exercise regularly at least 20 minutes 5 times a week. If you develop chest pain, have severe difficulty breathing, or feel very tired , stop exercising immediately and seek medical attention. Prescriptions: SIMVASTATIN 20 MG TABS (SIMVASTATIN) one daily  #90 x 6   Entered and Authorized by:   Gordy Savers  MD   Signed by:   Gordy Savers  MD on 07/08/2010   Method used:   Print then Give to Patient   RxID:   [id number] LUNESTA 2 MG TABS (ESZOPICLONE) one at bedtime as needed for sleep  #30 x 4   Entered and Authorized by:   Gordy Savers  MD   Signed by:   Gordy Savers  MD on 07/08/2010   Method used:   Print then Give to Patient   RxID:   [id number] LEVOTHROID 50 MCG  TABS (LEVOTHYROXINE SODIUM) one daily  #90 x 6   Entered and Authorized by:   Gordy Savers  MD   Signed by:   Gordy Savers  MD on 07/08/2010   Method used:   Print then Give to Patient   RxID:   [id number]    Orders Added: 1)  Est. Patient Level IV [01027]   Immunization History:  Influenza Immunization History:    Influenza:   Historical (06/24/2010)  Pneumovax Immunization History:    Pneumovax:  Pneumovax (10/23/2008)   Immunization History:  Influenza Immunization History:    Influenza:  Historical (06/24/2010) done at [facility]

## 2010-09-23 NOTE — Assessment & Plan Note (Signed)
Summary: 1 yr F/U  Medications Added VYTORIN 10-20 MG TABS (EZETIMIBE-SIMVASTATIN)       Allergies Added: NKDA  Visit Type:  Follow-up Primary Provider:  Gordy Savers  MD  CC:  CAD.  History of Present Illness: The patient presents for one year followup. Since I last saw him he has been doing well. He continues to be active. He slowing down slightly with age but still does is walking and exercising in his home. He had a stress test last October which demonstrated a preserved ejection fraction with a probable old inferior infarct. He has had no new chest pressure, neck or arm discomfort. He does not notice any palpitations though he continues to have PVCs on EKGs and physical. He has had no presyncope or syncope. He denies any PND or orthopnea. He did have GI bleeding and was hospitalized briefly in January. His dose of aspirin was reduced. Of note his insurance company wants to stop Vytorin. It has been suggested that he start simvastatin 20 mg. He plans to do this when he runs out of his Vytorin.  Current Medications (verified): 1)  Levothroid 50 Mcg  Tabs (Levothyroxine Sodium) .... One Daily 2)  Nitrostat 0.4 Mg  Subl (Nitroglycerin) .Marland Kitchen.. 1 For Chest Pain Prn 3)  Preservision .... Daily 4)  Multivitamins   Tabs (Multiple Vitamin) .... Daily 5)  Calcium 500 Mg Tabs (Calcium Carbonate) .... Daily 6)  Tylenol Pm Extra Strength 500-25 Mg Tabs (Diphenhydramine-Apap (Sleep)) .... As Needed 7)  Lunesta 2 Mg Tabs (Eszopiclone) .... One At Bedtime As Needed For Sleep 8)  Aspir-Low 81 Mg Tbec (Aspirin) .... One Daily 9)  Vytorin 10-20 Mg Tabs (Ezetimibe-Simvastatin)  Allergies (verified): No Known Drug Allergies  Past History:  Past Medical History: Reviewed history from 09/10/2009 and no changes required. Prostate cancer, hx of- history of   increasing PSAs Skin cancer, hx of Mohs L ear Coronary artery disease status post CABG (LIMA to    the LAD, SVG to the right coronary  artery, and SVG to obtuse marginal in    1991) Hyperlipidemia Hypertension Hypothyroidism History of diplopia 2009 history of gastric ulcer, secondary to aspirin, January 2011  Past Surgical History: Coronary artery bypass graft 91 Inguinal herniorrhaphy 94 Prostatectomy 1990 MOHS  left ear 8/07, left cheek [facility] March 2010 Cataract extraction Sigmoidoscopy 1999 Cardiolite  stress test September 2007, October 2010 Upper panendoscopy/GI bleed January 2011  Review of Systems       As stated in the HPI and negative for all other systems.   Vital Signs:  Patient profile:   77 year old male Height:      66.5 inches Weight:      147 pounds BMI:     23.46 Pulse rate:   61 / minute Resp:     16 per minute BP sitting:   142 / 82  (right arm)  Vitals Entered By: Marrion Coy, CNA (June 12, 2010 9:46 AM)  Physical Exam  General:  Well developed, well nourished, in no acute distress. Head:  normocephalic and atraumatic Neck:  Neck supple, no JVD. No masses, thyromegaly or abnormal cervical nodes. Chest Wall:  Well-healed sternotomy scar Lungs:  Clear bilaterally to auscultation and percussion. Abdomen:  Bowel sounds positive,abdomen soft and non-tender without masses, organomegaly or hernias noted. Msk:  abnormal muscle wasting for age Extremities:  No clubbing or cyanosis. Neurologic:  Alert and oriented x 3. Skin:  Intact without lesions or rashes. Cervical Nodes:  no significant  adenopathy Psych:  Normal affect.   Detailed Cardiovascular Exam  Neck    Carotids: Carotids full and equal bilaterally without bruits.      Neck Veins: Normal, no JVD.    Heart    Inspection: no deformities or lifts noted.      Palpation: normal PMI with no thrills palpable.      Auscultation: regular rate and rhythm, S1, S2 without murmurs, rubs, gallops, or clicks.    Vascular    Abdominal Aorta: no palpable masses, pulsations, or audible bruits.      Femoral Pulses: normal femoral  pulses bilaterally.      Pedal Pulses: normal pedal pulses bilaterally.      Radial Pulses: normal radial pulses bilaterally.      Peripheral Circulation: no clubbing, cyanosis, or edema noted with normal capillary refill.     Impression & Recommendations:  Problem # 1:  CORONARY ARTERY DISEASE (ICD-414.00) He has no new symptoms. He is participating in risk reduction. His stress test was low risk last year. No change in therapy is indicated. Orders: EKG w/ Interpretation (93000)  Problem # 2:  HYPERTENSION (ICD-401.9) His blood pressure is usually well controlled. I will make no change in his regimen.  Problem # 3:  HYPERLIPIDEMIA (ICD-272.4) He will stop Vytorin when he runs out and start simvastatin 20 mg daily. He is instructed to get a lipid profile and liver enzymes 8 weeks after changing meds. The goal should be an LDL less than 100 and HDL greater than 40.  Patient Instructions: 1)  Your physician recommends that you schedule a follow-up appointment in: 12 months  2)  Your physician recommends that you continue on your current medications as directed. Please refer to the Current Medication list given to you today. 3)  Change to Simvastatin 20 mg a day once you have finished your Vytorin.  You will need fasting lipid and liver profiles 8 weeks after starting Simvastatin

## 2010-09-23 NOTE — Assessment & Plan Note (Signed)
Summary: 1 month rov/njr   Vital Signs:  Patient profile:   77 year old male Weight:      148 pounds Temp:     97.8 degrees F oral BP sitting:   120 / 70  (right arm) Cuff size:   regular  Vitals Entered By: Duard Brady LPN (October 08, 2009 1:03 PM) CC: rov - c/o dizziness and chills  , request for Lunesta   Primary Care Provider:  Gordy Savers  MD  CC:  rov - c/o dizziness and chills   and request for Lunesta.  History of Present Illness:  77 year old patient this recent history of upper GI bleeding and secondary anemia.  Over the weekend.  He had a brief episode of dizziness also associated  with some chills that has not recurred.  There has been no recurrent melena.  He has discontinued iron therapy and aspirin therapy is still on hold.  He does have a history of coronary artery disease.  He remains on Prilosec  Allergies: No Known Drug Allergies  Past History:  Past Medical History: Reviewed history from 09/10/2009 and no changes required. Prostate cancer, hx of- history of   increasing PSAs Skin cancer, hx of Mohs L ear Coronary artery disease status post CABG (LIMA to    the LAD, SVG to the right coronary artery, and SVG to obtuse marginal in    1991) Hyperlipidemia Hypertension Hypothyroidism History of diplopia 2009 history of gastric ulcer, secondary to aspirin, January 2011  Review of Systems       The patient complains of decreased hearing.  The patient denies anorexia, fever, weight loss, weight gain, vision loss, hoarseness, chest pain, syncope, dyspnea on exertion, peripheral edema, prolonged cough, headaches, hemoptysis, abdominal pain, melena, hematochezia, severe indigestion/heartburn, hematuria, incontinence, genital sores, muscle weakness, suspicious skin lesions, transient blindness, difficulty walking, depression, unusual weight change, abnormal bleeding, enlarged lymph nodes, angioedema, breast masses, and testicular masses.     Physical Exam  General:  Well-developed,well-nourished,in no acute distress; alert,appropriate and cooperative throughout examination Head:  Normocephalic and atraumatic without obvious abnormalities. No apparent alopecia or balding. Eyes:  No corneal or conjunctival inflammation noted. EOMI. Perrla. Funduscopic exam benign, without hemorrhages, exudates or papilledema. Vision grossly normal. Mouth:  Oral mucosa and oropharynx without lesions or exudates.  Teeth in good repair. Neck:  No deformities, masses, or tenderness noted. Lungs:  Normal respiratory effort, chest expands symmetrically. Lungs are clear to auscultation, no crackles or wheezes. Heart:  Normal rate and regular rhythm. S1 and S2 normal without gallop, murmur, click, rub or other extra sounds. Abdomen:  Bowel sounds positive,abdomen soft and non-tender without masses, organomegaly or hernias noted.   Impression & Recommendations:  Problem # 1:  ACUT GASTR ULCER W/HEMORR W/O MENTION OBST (ICD-531.00)  His updated medication list for this problem includes:    Prilosec 40 Mg Cpdr (Omeprazole) .Marland Kitchen... 1 once daily    His updated medication list for this problem includes:    Prilosec 40 Mg Cpdr (Omeprazole) .Marland Kitchen... 1 once daily  Orders: Venipuncture (62831) TLB-CBC Platelet - w/Differential (85025-CBCD)  Problem # 2:  ANEMIA (ICD-285.9)  His updated medication list for this problem includes:    Ferrous Sulfate 325 (65 Fe) Mg Tabs (Ferrous sulfate) .Marland Kitchen... 1 two times a day    His updated medication list for this problem includes:    Ferrous Sulfate 325 (65 Fe) Mg Tabs (Ferrous sulfate) .Marland Kitchen... 1 two times a day  Orders: Venipuncture (51761) TLB-CBC Platelet - w/Differential (  85025-CBCD)  Complete Medication List: 1)  Bayer Aspirin 325 Mg Tabs (Aspirin) .... Take 1 tablet by mouth once a day 2)  Vytorin 10-20 Mg Tabs (Ezetimibe-simvastatin) .... Take 1 tablet by mouth once a day 3)  Levothroid 50 Mcg Tabs  (Levothyroxine sodium) .... One daily 4)  Nitrostat 0.4 Mg Subl (Nitroglycerin) .Marland Kitchen.. 1 for chest pain prn 5)  Preservision  .... Daily 6)  Multivitamins Tabs (Multiple vitamin) .... Daily 7)  Calcium 500 Mg Tabs (Calcium carbonate) .... Daily 8)  Tylenol Pm Extra Strength 500-25 Mg Tabs (Diphenhydramine-apap (sleep)) .... As needed 9)  Prilosec 40 Mg Cpdr (Omeprazole) .Marland Kitchen.. 1 once daily 10)  Ferrous Sulfate 325 (65 Fe) Mg Tabs (Ferrous sulfate) .Marland Kitchen.. 1 two times a day 11)  Lunesta 2 Mg Tabs (Eszopiclone) .... One at bedtime as needed for sleep  Patient Instructions: 1)  return as scheduled for routine follow-up Prescriptions: LUNESTA 2 MG TABS (ESZOPICLONE) one at bedtime as needed for sleep  #30 x 4   Entered and Authorized by:   Gordy Savers  MD   Signed by:   Gordy Savers  MD on 10/08/2009   Method used:   Print then Give to Patient   RxID:   [id number]

## 2010-09-23 NOTE — Letter (Signed)
Summary: Patient [facility] Biopsy Results  [facility]  [address] [place], [place] [address]   Phone: [phone]  Fax: [phone]        September 03, 2009 MRN: [id number]    Raymond Swanson [address] [place], [place]  [address]    Dear Raymond Swanson,  I am pleased to inform you that the biopsies taken during your recent endoscopic examination did not show any evidence of cancer upon pathologic examination.  Additional information/recommendations:  No further action is needed at this time.  Please follow-up with      your primary care physician for your other healthcare needs.  Continue with the treatment plan as outlined on the day of your      exam.  Please call us if you are having persistent problems or have questions about your condition that have not been fully answered at this time.  Sincerely,  Iva Boop MD, [facility]  This letter has been electronically signed by your physician.  Appended Document: Patient Notice-Endo Biopsy Results letter mailed to patient's home

## 2010-09-23 NOTE — Miscellaneous (Signed)
  Clinical Lists Changes  Observations: Added new observation of NUCLEAR NOS: Exercise Capacity: Fair exercise capacity. BP Response: Normal blood pressure response. Clinical Symptoms: No chest pain ECG Impression: Insignificant upsloping ST segment depression. Overall Impression: Low risk stress nuclear study. Overall Impression Comments: Mild fixed defect in inferior wall consistent with previous infarct. No ischemia. (06/11/2009 15:09)      Nuclear Study  Procedure date:  06/11/2009  Findings:      Exercise Capacity: Fair exercise capacity. BP Response: Normal blood pressure response. Clinical Symptoms: No chest pain ECG Impression: Insignificant upsloping ST segment depression. Overall Impression: Low risk stress nuclear study. Overall Impression Comments: Mild fixed defect in inferior wall consistent with previous infarct. No ischemia.

## 2010-09-23 NOTE — Letter (Signed)
Summary: [facility]  [facility]   Imported By: Maryln Gottron 06/03/2010 10:19:38  _____________________________________________________________________  External Attachment:    Type:   Image     Comment:   External Document

## 2010-09-23 NOTE — Assessment & Plan Note (Signed)
Summary: post hos fup/njr   Vital Signs:  Patient profile:   77 year old male Weight:      148 pounds Pulse rate:   64 / minute Pulse rhythm:   regular BP sitting:   118 / 58  (left arm) Cuff size:   regular  Vitals Entered By: Raechel Ache, RN (September 10, 2009 1:17 PM) CC: Hospital f/u, feels better.   Primary Care Provider:  Gordy Savers  MD  CC:  Hospital f/u and feels better.Marland Kitchen  History of Present Illness: 77 year old patient who is seen today phone that hospital discharge.  He was admitted with acute upper GI bleeding secondary to a gastric antral ulcer.  Since his discharge 11 days ago.  He has done well.  He remains slightly weak, but has had no further dizziness or any other orthostatic symptoms.  His hematocrit dropped to a level of approximately 28%, but it did not require blood transfusions.  He was discharged on iron , omeprazole and  with discontinuation of aspirin therapy hospital records reviewed.  Allergies: No Known Drug Allergies  Past History:  Past Medical History: Prostate cancer, hx of- history of   increasing PSAs Skin cancer, hx of Mohs L ear Coronary artery disease status post CABG (LIMA to    the LAD, SVG to the right coronary artery, and SVG to obtuse marginal in    1991) Hyperlipidemia Hypertension Hypothyroidism History of diplopia 2009 history of gastric ulcer, secondary to aspirin, January 2011  Past Surgical History: Coronary artery bypass graft 91 Inguinal herniorrhaphy 94 Prostatectomy 1990 MOHS  left ear 8/07 Cataract extraction Sigmoidoscopy 1999 Cardiolite  stress test September 2007, October 2010 upper panendoscopy January 2011  Review of Systems  The patient denies anorexia, fever, weight loss, weight gain, vision loss, decreased hearing, hoarseness, chest pain, syncope, dyspnea on exertion, peripheral edema, prolonged cough, headaches, hemoptysis, abdominal pain, melena, hematochezia, severe indigestion/heartburn,  hematuria, incontinence, genital sores, muscle weakness, suspicious skin lesions, transient blindness, difficulty walking, depression, unusual weight change, abnormal bleeding, enlarged lymph nodes, angioedema, breast masses, and testicular masses.    Physical Exam  General:  Well-developed,well-nourished,in no acute distress; alert,appropriate and cooperative throughout examination; 120/60 Head:  Normocephalic and atraumatic without obvious abnormalities. No apparent alopecia or balding. Eyes:  No corneal or conjunctival inflammation noted. EOMI. Perrla. Funduscopic exam benign, without hemorrhages, exudates or papilledema. Vision grossly normal. Mouth:  Oral mucosa and oropharynx without lesions or exudates.  Teeth in good repair. Neck:  No deformities, masses, or tenderness noted. Lungs:  Normal respiratory effort, chest expands symmetrically. Lungs are clear to auscultation, no crackles or wheezes. Heart:  Normal rate and regular rhythm. S1 and S2 normal without gallop, murmur, click, rub or other extra sounds. Abdomen:  Bowel sounds positive,abdomen soft and non-tender without masses, organomegaly or hernias noted. Msk:  No deformity or scoliosis noted of thoracic or lumbar spine.   Extremities:  No clubbing, cyanosis, edema, or deformity noted with normal full range of motion of all joints.   Skin:  Intact without suspicious lesions or rashes   Impression & Recommendations:  Problem # 1:  MELENA (ICD-578.1)  Problem # 2:  HYPERTENSION (ICD-401.9)  Problem # 3:  CORONARY ARTERY DISEASE (ICD-414.00)  His updated medication list for this problem includes:    Bayer Aspirin 325 Mg Tabs (Aspirin) .Marland Kitchen... Take 1 tablet by mouth once a day    Nitrostat 0.4 Mg Subl (Nitroglycerin) .Marland Kitchen... 1 for chest pain prn  His updated medication list  for this problem includes:    Bayer Aspirin 325 Mg Tabs (Aspirin) .Marland Kitchen... Take 1 tablet by mouth once a day    Nitrostat 0.4 Mg Subl (Nitroglycerin) .Marland Kitchen... 1  for chest pain prn  Complete Medication List: 1)  Bayer Aspirin 325 Mg Tabs (Aspirin) .... Take 1 tablet by mouth once a day 2)  Vytorin 10-20 Mg Tabs (Ezetimibe-simvastatin) .... Take 1 tablet by mouth once a day 3)  Levothroid 50 Mcg Tabs (Levothyroxine sodium) .... One daily 4)  Nitrostat 0.4 Mg Subl (Nitroglycerin) .Marland Kitchen.. 1 for chest pain prn 5)  Preservision  .... Daily 6)  Multivitamins Tabs (Multiple vitamin) .... Daily 7)  Calcium 500 Mg Tabs (Calcium carbonate) .... Daily 8)  Tylenol Pm Extra Strength 500-25 Mg Tabs (Diphenhydramine-apap (sleep)) .... As needed 9)  Prilosec 40 Mg Cpdr (Omeprazole) .Marland Kitchen.. 1 once daily 10)  Ferrous Sulfate 325 (65 Fe) Mg Tabs (Ferrous sulfate) .Marland Kitchen.. 1 two times a day  Other Orders: Venipuncture (16109) TLB-CBC Platelet - w/Differential (85025-CBCD)  Patient Instructions: 1)  Please schedule a follow-up appointment in 1 month.

## 2010-09-23 NOTE — Assessment & Plan Note (Signed)
Summary: DIZZINESS/BLACK STOOL/NJR   Vital Signs:  Patient profile:   77 year old male Weight:      147 pounds Temp:     97.4 degrees F oral Pulse rate:   96 / minute Pulse rhythm:   irregular BP sitting:   112 / 40  (left arm) Cuff size:   regular  Vitals Entered By: Raechel Ache, RN (August 28, 2009 12:33 PM) CC: C/o black stools x 2, clammy, nearly passing out and weak.   Primary Care Provider:  Gordy Savers  MD  CC:  C/o black stools x 2, clammy, and nearly passing out and weak.Marland Kitchen  History of Present Illness:  77 year old patient who was stable until yesterday morning when he had an episode of clamminess dizziness and near syncope.  He he stated that he was forced to lay flat on the floor to prevent a blackout spell.  This occurred after his early morning bowel movement, which was described as a melanotic stool.  Throughout  the day yesterday, he felt quite weak.  Last night he became so weak that he was forced to crawl back to bed after use of  his bathroom facilities.  This morning.  He had another melanotic stool associated with diaphoresis and profound weakness.  He complains of generalized fatigue and lack of energy. He has had remote colonoscopy and a screening sigmoidoscopy in 1999.  He states that he has had a recent negative nuclear medicine stress test.  He takes aspirin 325 mg daily, but no anti-inflammatory medication.  No history of peptic ulcer disease or GI bleeding.  Allergies: No Known Drug Allergies  Past History:  Past Medical History: Prostate cancer, hx of- history of   increasing PSAs Skin cancer, hx of Mohs L ear Coronary artery disease status post CABG (LIMA to    the LAD, SVG to the right coronary artery, and SVG to obtuse marginal in    1991) Hyperlipidemia Hypertension Hypothyroidism History of diplopia 2009  Past Surgical History: Coronary artery bypass graft 91 Inguinal herniorrhaphy 94 Prostatectomy 1990 MOHS  left ear  8/07 Cataract extraction Sigmoidoscopy 1999 Cardiolite  stress test September 2007, October 2010  Family History: Reviewed history from 10/18/2007 and no changes required. father died age 61, pancreatic cancer mother died age 25  One brother died age 53 lung cancer 4 sisters  Social History: Reviewed history from 10/23/2008 and no changes required. Retired Married remote tobacco use  Review of Systems       The patient complains of anorexia, weight loss, melena, muscle weakness, and difficulty walking.  The patient denies fever, weight gain, vision loss, decreased hearing, hoarseness, chest pain, syncope, dyspnea on exertion, peripheral edema, prolonged cough, headaches, hemoptysis, abdominal pain, hematochezia, severe indigestion/heartburn, hematuria, incontinence, genital sores, suspicious skin lesions, transient blindness, depression, unusual weight change, abnormal bleeding, enlarged lymph nodes, angioedema, breast masses, and testicular masses.    Physical Exam  General:  elderly alert, no distress; blood pressure 100/60, sitting resting pulse rate 100 Head:  Normocephalic and atraumatic without obvious abnormalities. No apparent alopecia or balding. Eyes:  mild arcus senilis Ears:  hearing aids in place.  Bilateral Nose:  External nasal examination shows no deformity or inflammation. Nasal mucosa are pink and moist without lesions or exudates. Mouth:  Oral mucosa and oropharynx without lesions or exudates.  Teeth in good repair. Neck:  No deformities, masses, or tenderness noted. Chest Wall:  No deformities, masses, tenderness or gynecomastia noted. status post sternotomy Breasts:  No masses or gynecomastia noted Lungs:  Normal respiratory effort, chest expands symmetrically. Lungs are clear to auscultation, no crackles or wheezes. Heart:  Normal rate and regular rhythm. S1 and S2 normal without gallop, murmur, click, rub or other extra sounds. Abdomen:  Bowel sounds  positive,abdomen soft and non-tender without masses, organomegaly or hernias noted. Rectal:  No external abnormalities noted. Normal sphincter tone. No rectal masses or tenderness. melanotic hematest positive stool Genitalia:  Testes bilaterally descended without nodularity, tenderness or masses. No scrotal masses or lesions. No penis lesions or urethral discharge. Msk:  No deformity or scoliosis noted of thoracic or lumbar spine.   Pulses:  bilateral femoral bruits noted pedal pulses intact Extremities:  No clubbing, cyanosis, edema, or deformity noted with normal full range of motion of all joints.   Neurologic:  alert & oriented X3, cranial nerves II-XII intact, strength normal in all extremities, and gait normal.   Skin:  Intact without suspicious lesions or rashes Cervical Nodes:  No lymphadenopathy noted Axillary Nodes:  No palpable lymphadenopathy Inguinal Nodes:  No significant adenopathy Psych:  Cognition and judgment appear intact. Alert and cooperative with normal attention span and concentration. No apparent delusions, illusions, hallucinations   Impression & Recommendations:  Problem # 1:  MELENA (ICD-578.1)  Problem # 2:  HYPOTHYROIDISM (ICD-244.9)  His updated medication list for this problem includes:    Levothroid 50 Mcg Tabs (Levothyroxine sodium) ..... One daily  Problem # 3:  HYPERTENSION (ICD-401.9)  Problem # 4:  CORONARY ARTERY DISEASE (ICD-414.00)  His updated medication list for this problem includes:    Bayer Aspirin 325 Mg Tabs (Aspirin) .Marland Kitchen... Take 1 tablet by mouth once a day    Nitrostat 0.4 Mg Subl (Nitroglycerin) .Marland Kitchen... 1 for chest pain prn  Problem # 5:  PROSTATE CANCER, HX OF (ICD-V10.46)  Complete Medication List: 1)  Bayer Aspirin 325 Mg Tabs (Aspirin) .... Take 1 tablet by mouth once a day 2)  Vytorin 10-20 Mg Tabs (Ezetimibe-simvastatin) .... Take 1 tablet by mouth once a day 3)  Levothroid 50 Mcg Tabs (Levothyroxine sodium) .... One daily 4)   Nitrostat 0.4 Mg Subl (Nitroglycerin) .Marland Kitchen.. 1 for chest pain prn 5)  Preservision  .... Daily 6)  Multivitamins Tabs (Multiple vitamin) .... Daily 7)  Calcium 500 Mg Tabs (Calcium carbonate) .... Daily 8)  Tylenol Pm Extra Strength 500-25 Mg Tabs (Diphenhydramine-apap (sleep)) .... As needed  Patient Instructions: 1)  will admit to the hospital for further evaluation and treatment of his GI bleeding.  Will hold all antihypertensive medications admit to a telemetry setting and support with IV fluids.  Will make n.p.o. and GI consultation will be obtained. 2)  Discussed with the hospitalist who will evaluated in the ED setting

## 2010-09-25 ENCOUNTER — Other Ambulatory Visit (HOSPITAL_COMMUNITY): Payer: Self-pay | Admitting: Urology

## 2010-09-25 DIAGNOSIS — C61 Malignant neoplasm of prostate: Secondary | ICD-10-CM

## 2010-10-07 ENCOUNTER — Ambulatory Visit (HOSPITAL_COMMUNITY)
Admission: RE | Admit: 2010-10-07 | Discharge: 2010-10-07 | Disposition: A | Discharge status: Home / Self Care | Payer: Medicare Other | Source: Ambulatory Visit | Attending: Urology | Admitting: Urology

## 2010-10-07 ENCOUNTER — Encounter (HOSPITAL_COMMUNITY): Payer: Self-pay

## 2010-10-07 ENCOUNTER — Encounter (HOSPITAL_COMMUNITY)
Admission: RE | Admit: 2010-10-07 | Discharge: 2010-10-07 | Disposition: A | Discharge status: Home / Self Care | Payer: Medicare Other | Source: Ambulatory Visit | Attending: Urology | Admitting: Urology

## 2010-10-07 DIAGNOSIS — R972 Elevated prostate specific antigen [PSA]: Secondary | ICD-10-CM | POA: Insufficient documentation

## 2010-10-07 DIAGNOSIS — C61 Malignant neoplasm of prostate: Secondary | ICD-10-CM | POA: Insufficient documentation

## 2010-10-07 HISTORY — DX: Malignant (primary) neoplasm, unspecified: C80.1

## 2010-10-07 MED ORDER — TECHNETIUM TC 99M MEDRONATE IV KIT
23.6000 | PACK | Freq: Once | INTRAVENOUS | Status: AC | PRN
  Administered 2010-10-07: 24 via INTRAVENOUS

## 2010-10-28 ENCOUNTER — Other Ambulatory Visit (INDEPENDENT_AMBULATORY_CARE_PROVIDER_SITE_OTHER): Payer: Medicare Other | Admitting: Internal Medicine

## 2010-10-28 DIAGNOSIS — Z0000 Encounter for general adult medical examination without abnormal findings: Secondary | ICD-10-CM

## 2010-10-28 DIAGNOSIS — Z125 Encounter for screening for malignant neoplasm of prostate: Secondary | ICD-10-CM

## 2010-10-28 DIAGNOSIS — E785 Hyperlipidemia, unspecified: Secondary | ICD-10-CM

## 2010-10-28 DIAGNOSIS — E039 Hypothyroidism, unspecified: Secondary | ICD-10-CM

## 2010-10-28 DIAGNOSIS — I10 Essential (primary) hypertension: Secondary | ICD-10-CM

## 2010-10-28 DIAGNOSIS — I2510 Atherosclerotic heart disease of native coronary artery without angina pectoris: Secondary | ICD-10-CM

## 2010-10-28 LAB — BASIC METABOLIC PANEL
GFR: 70.72 mL/min (ref >60.00)
Glucose, Bld: 99 mg/dL (ref 70–99)
Potassium: 3.9 mEq/L (ref 3.5–5.1)

## 2010-10-28 LAB — HEPATIC FUNCTION PANEL
ALT: 18 U/L (ref 0–53)
Alkaline Phosphatase: 70 U/L (ref 39–117)
Total Bilirubin: 0.8 mg/dL (ref 0.3–1.2)

## 2010-10-28 LAB — POCT URINALYSIS DIPSTICK
Nitrite, UA: NEGATIVE
Protein, UA: NEGATIVE
Spec Grav, UA: 1.02
pH, UA: 6

## 2010-10-28 LAB — TSH: TSH: 3.57 u[IU]/mL (ref 0.35–5.50)

## 2010-10-28 LAB — CBC WITH DIFFERENTIAL/PLATELET
Eosinophils Relative: 1.8 % (ref 0.0–5.0)
Hemoglobin: 14.8 g/dL (ref 13.0–17.0)
Monocytes Absolute: 0.6 10*3/uL (ref 0.1–1.0)
Monocytes Relative: 9.4 % (ref 3.0–12.0)
RBC: 4.69 Mil/uL (ref 4.22–5.81)
RDW: 14.6 % (ref 11.5–14.6)

## 2010-10-28 LAB — LIPID PANEL
HDL: 43.4 mg/dL (ref >39.00)
Total CHOL/HDL Ratio: 3
Triglycerides: 98 mg/dL (ref 0.0–149.0)
VLDL: 19.6 mg/dL (ref 0.0–40.0)

## 2010-10-28 LAB — PSA: PSA: 16.42 ng/mL — ABNORMAL HIGH (ref 0.10–4.00)

## 2010-10-29 ENCOUNTER — Encounter: Payer: Self-pay | Admitting: Family Medicine

## 2010-10-29 ENCOUNTER — Ambulatory Visit (INDEPENDENT_AMBULATORY_CARE_PROVIDER_SITE_OTHER): Payer: Medicare Other | Admitting: Family Medicine

## 2010-10-29 VITALS — BP 130/60 | Temp 97.7°F | Ht 67.0 in | Wt 147.0 lb

## 2010-10-29 DIAGNOSIS — R42 Dizziness and giddiness: Secondary | ICD-10-CM

## 2010-10-29 DIAGNOSIS — I2510 Atherosclerotic heart disease of native coronary artery without angina pectoris: Secondary | ICD-10-CM

## 2010-10-29 NOTE — Patient Instructions (Signed)
Drink more fluids. Follow up promptly for any recurrent dizziness or any new symptoms.

## 2010-10-29 NOTE — Progress Notes (Signed)
  Subjective:    Patient ID: Raymond Swanson, male    DOB: 06-29-1925, 77 y.o.   MRN: [id number]  HPI  Patient seen as an acute work in with dizziness this morning. Onset after getting out of bed. Sensation of lightheadedness. No syncope. No vertigo. Past medical history of gastric ulcer also history of CAD. Just had labs yesterday for upcoming physical hemoglobin 14.8. He denies any nausea, vomiting , diarrhea, melena, chest pain, or dizziness. No focal weakness. Does not take any blood pressure medications. Generally does not drink a lot of water according to wife.    no dizziness at this time. Symptoms are transient and lasts about 15 minutes. Had one leg cramp right calf last night but none today. States quite active with yard work.   Review of Systems  Constitutional: Negative for fever, chills, activity change, appetite change and unexpected weight change.  HENT: Negative for facial swelling.   Eyes: Negative for photophobia and visual disturbance.  Respiratory: Negative for chest tightness and shortness of breath.   Cardiovascular: Negative for chest pain and palpitations.  Gastrointestinal: Negative for abdominal pain.  Musculoskeletal: Negative for gait problem.  Skin: Negative for rash.  Neurological: Negative for syncope, facial asymmetry, speech difficulty, weakness and headaches.  Hematological: Negative for adenopathy.  Psychiatric/Behavioral: Negative for confusion.       Objective:   Physical Exam  alert operative bed we gentleman in no distress blood pressure standing  130/60 and sitting 140/68  Oropharynx is moist and clear Neck supple no mass Chest good auscultation Heart regular rhythm and rate Extremities no edema   abdomen soft nontender Neuro cranial nerves II through XII are normal. No focal strength deficits. Gait normal. No ataxia.       Assessment & Plan:   #1 dizziness. Possible mild orthostatic change. Nonfocal exam this time. Drink more fluids. No  evidence for anemia. No evidence for arrhythmia. #2 history of CAD  #3 past history of gastric ulcer

## 2010-11-03 ENCOUNTER — Encounter: Payer: Self-pay | Admitting: Internal Medicine

## 2010-11-04 ENCOUNTER — Encounter: Payer: Self-pay | Admitting: Internal Medicine

## 2010-11-04 ENCOUNTER — Ambulatory Visit (INDEPENDENT_AMBULATORY_CARE_PROVIDER_SITE_OTHER): Payer: Medicare Other | Admitting: Internal Medicine

## 2010-11-04 DIAGNOSIS — E039 Hypothyroidism, unspecified: Secondary | ICD-10-CM

## 2010-11-04 DIAGNOSIS — I10 Essential (primary) hypertension: Secondary | ICD-10-CM

## 2010-11-04 DIAGNOSIS — I2510 Atherosclerotic heart disease of native coronary artery without angina pectoris: Secondary | ICD-10-CM

## 2010-11-04 DIAGNOSIS — E785 Hyperlipidemia, unspecified: Secondary | ICD-10-CM

## 2010-11-04 DIAGNOSIS — Z0000 Encounter for general adult medical examination without abnormal findings: Secondary | ICD-10-CM

## 2010-11-04 MED ORDER — LEVOTHYROXINE SODIUM 50 MCG PO TABS: 50.0000 ug | ORAL_TABLET | Freq: Every day | ORAL | Status: DC

## 2010-11-04 MED ORDER — NITROGLYCERIN 0.4 MG SL SUBL: 0.4000 mg | SUBLINGUAL_TABLET | SUBLINGUAL | Status: DC | PRN

## 2010-11-04 MED ORDER — SIMVASTATIN 20 MG PO TABS: 20.0000 mg | ORAL_TABLET | Freq: Every day | ORAL | Status: DC

## 2010-11-04 MED ORDER — ESZOPICLONE 2 MG PO TABS: 2.0000 mg | ORAL_TABLET | Freq: Every evening | ORAL | Status: DC | PRN

## 2010-11-04 MED ORDER — FLUOROURACIL 5 % EX CREA: TOPICAL_CREAM | Freq: Two times a day (BID) | CUTANEOUS | Status: DC

## 2010-11-04 NOTE — Patient Instructions (Signed)
It is important that you exercise regularly, at least 20 minutes 3 to 4 times per week.  If you develop chest pain or shortness of breath seek  medical attention.  Limit your sodium (Salt) intake   followup with cardiology and urology  Return in 6 months for follow-up  Take a calcium supplement, plus [phone] units of vitamin D

## 2010-11-04 NOTE — Progress Notes (Signed)
Subjective:    Patient ID: Raymond Swanson, male    DOB: 1925/08/01, 77 y.o.   MRN: [id number]  HPI   77 year old patient who is seen today for an annual exam. He was seen recently due to dizziness which has not reoccurred. He was fearful that this might have represented anemia of related to recurrent bleeding gastric ulcer. He has seen Dr. Patsi Sears recently  For followup of  prostate cancer. He is followed annually by cardiology for his coronary artery disease.  1. Risk factors, based on past  M,S,F history- patient has known coronary artery disease he has treated hypertension and dyslipidemia 2.  Physical activities: fairly active without exercise limitations. Denies any exertional chest pain or shortness of breath  3.  Depression/mood: no history of depression or mood disorder  4.  Hearing: uses bilateral hearing aids  5.  ADL's: independent in all aspects of daily living  6.  Fall risk: low  7.  Home safety: no problems identified  8.  Height weight, and visual acuity; height and weight stable no change in visual acuity  9.  Counseling: followup urology and cardiology. We'll continue a heart healthy diet. Calcium and vitamin D supplements recommended  10. Lab orders based on risk factors: laboratory studies have been reviewed  11. Referral -   followup cardiology and urology as scheduled 12. Care plan: heart healthy diet regular exercise all encouraged he will continue on 81 mg of aspirin daily  13. Cognitive assessment:  Alert and oriented with normal affect no cognitive dysfunction        Review of Systems  Constitutional: Negative for fever, chills, activity change, appetite change and fatigue.  HENT: Positive for hearing loss. Negative for ear pain, congestion, rhinorrhea, sneezing, mouth sores, trouble swallowing, neck pain, neck stiffness, dental problem, voice change, sinus pressure and tinnitus.   Eyes: Negative for photophobia, pain, redness and visual  disturbance.  Respiratory: Negative for apnea, cough, choking, chest tightness, shortness of breath and wheezing.   Cardiovascular: Negative for chest pain, palpitations and leg swelling.  Gastrointestinal: Negative for nausea, vomiting, abdominal pain, diarrhea, constipation, blood in stool, abdominal distention, anal bleeding and rectal pain.  Genitourinary: Negative for dysuria, urgency, frequency, hematuria, flank pain, decreased urine volume, discharge, penile swelling, scrotal swelling, difficulty urinating, genital sores and testicular pain.  Musculoskeletal: Negative for myalgias, back pain, joint swelling, arthralgias and gait problem.  Skin: Negative for color change, rash and wound.  Neurological: Negative for dizziness, tremors, seizures, syncope, facial asymmetry, speech difficulty, weakness, light-headedness, numbness and headaches.  Hematological: Negative for adenopathy. Does not bruise/bleed easily.  Psychiatric/Behavioral: Negative for suicidal ideas, hallucinations, behavioral problems, confusion, sleep disturbance, self-injury, dysphoric mood, decreased concentration and agitation. The patient is not nervous/anxious.        Objective:   Physical Exam  Constitutional: He appears well-developed and well-nourished.  HENT:  Head: Normocephalic and atraumatic.  Right Ear: External ear normal.  Left Ear: External ear normal.  Nose: Nose normal.  Mouth/Throat: Oropharynx is clear and moist.        Hearing aids and placed bilaterally. Modest amount of wax in the right canal  Eyes: Conjunctivae and EOM are normal. Pupils are equal, round, and reactive to light. No scleral icterus.  Neck: Normal range of motion. Neck supple. No JVD present. No thyromegaly present.  Cardiovascular: Regular rhythm, normal heart sounds and intact distal pulses.  Exam reveals no gallop and no friction rub.   No murmur heard.  Bilateral femoral bruits   sternotomy scar  Pulmonary/Chest: Effort  normal and breath sounds normal. He exhibits no tenderness.  Abdominal: Soft. Bowel sounds are normal. He exhibits no distension and no mass. There is no tenderness.  Genitourinary: Prostate normal and penis normal.  Musculoskeletal: Normal range of motion. He exhibits no edema and no tenderness.  Lymphadenopathy:    He has no cervical adenopathy.  Neurological: He is alert. He has normal reflexes. No cranial nerve deficit. Coordination normal.  Skin: Skin is warm and dry. No rash noted.        Multiple actinic keratoses over the upper back and arms multiple solar keratoses. Evidence of scar involving this vessel left upper back from prior skin cancer resection  Psychiatric: He has a normal mood and affect. His behavior is normal.          Assessment & Plan:   annual health assessment  Coronary artery disease stable  Dyslipidemia stable lipid profile well controlled  Hypertension stable  Prostate cancer   He will follow up with cardiology and urology as planned. Return here in 6 months or as needed.

## 2010-11-04 NOTE — Progress Notes (Signed)
  Subjective:    Patient ID: Raymond Swanson, male    DOB: 12-Jul-1925, 77 y.o.   MRN: [id number]  HPI      Review of Systems     Objective:   Physical Exam        Assessment & Plan:

## 2010-11-09 LAB — CBC
HCT: 28.4 % — ABNORMAL LOW (ref 39.0–52.0)
HCT: 28.5 % — ABNORMAL LOW (ref 39.0–52.0)
HCT: 29.3 % — ABNORMAL LOW (ref 39.0–52.0)
HCT: 29.6 % — ABNORMAL LOW (ref 39.0–52.0)
HCT: 33.5 % — ABNORMAL LOW (ref 39.0–52.0)
HCT: 34.3 % — ABNORMAL LOW (ref 39.0–52.0)
Hemoglobin: 10.2 g/dL — ABNORMAL LOW (ref 13.0–17.0)
Hemoglobin: 10.2 g/dL — ABNORMAL LOW (ref 13.0–17.0)
Hemoglobin: 12 g/dL — ABNORMAL LOW (ref 13.0–17.0)
Hemoglobin: 9.6 g/dL — ABNORMAL LOW (ref 13.0–17.0)
Hemoglobin: 9.7 g/dL — ABNORMAL LOW (ref 13.0–17.0)
MCHC: 34 g/dL (ref 30.0–36.0)
MCHC: 34 g/dL (ref 30.0–36.0)
MCHC: 34.1 g/dL (ref 30.0–36.0)
MCHC: 34.2 g/dL (ref 30.0–36.0)
MCHC: 34.5 g/dL (ref 30.0–36.0)
MCHC: 34.7 g/dL (ref 30.0–36.0)
MCHC: 34.9 g/dL (ref 30.0–36.0)
MCHC: 35 g/dL (ref 30.0–36.0)
MCV: 92.5 fL (ref 78.0–100.0)
MCV: 93.7 fL (ref 78.0–100.0)
MCV: 93.8 fL (ref 78.0–100.0)
MCV: 93.9 fL (ref 78.0–100.0)
MCV: 93.9 fL (ref 78.0–100.0)
MCV: 94 fL (ref 78.0–100.0)
MCV: 94.1 fL (ref 78.0–100.0)
Platelets: 136 10*3/uL — ABNORMAL LOW (ref 150–400)
Platelets: 138 10*3/uL — ABNORMAL LOW (ref 150–400)
Platelets: 141 10*3/uL — ABNORMAL LOW (ref 150–400)
Platelets: 150 10*3/uL (ref 150–400)
Platelets: 153 10*3/uL (ref 150–400)
Platelets: 156 10*3/uL (ref 150–400)
Platelets: 180 10*3/uL (ref 150–400)
RBC: 3.02 MIL/uL — ABNORMAL LOW (ref 4.22–5.81)
RBC: 3.02 MIL/uL — ABNORMAL LOW (ref 4.22–5.81)
RBC: 3.03 MIL/uL — ABNORMAL LOW (ref 4.22–5.81)
RBC: 3.12 MIL/uL — ABNORMAL LOW (ref 4.22–5.81)
RBC: 3.16 MIL/uL — ABNORMAL LOW (ref 4.22–5.81)
RBC: 3.7 MIL/uL — ABNORMAL LOW (ref 4.22–5.81)
RDW: 13.8 % (ref 11.5–15.5)
RDW: 13.9 % (ref 11.5–15.5)
RDW: 14.2 % (ref 11.5–15.5)
RDW: 14.3 % (ref 11.5–15.5)
RDW: 14.3 % (ref 11.5–15.5)
RDW: 14.5 % (ref 11.5–15.5)
RDW: 14.8 % (ref 11.5–15.5)
WBC: 10.2 10*3/uL (ref 4.0–10.5)
WBC: 10.4 10*3/uL (ref 4.0–10.5)
WBC: 6.8 10*3/uL (ref 4.0–10.5)
WBC: 7.4 10*3/uL (ref 4.0–10.5)
WBC: 7.7 10*3/uL (ref 4.0–10.5)
WBC: 7.7 10*3/uL (ref 4.0–10.5)
WBC: 8.2 10*3/uL (ref 4.0–10.5)

## 2010-11-09 LAB — DIFFERENTIAL
Basophils Absolute: 0 10*3/uL (ref 0.0–0.1)
Basophils Relative: 0 % (ref 0–1)
Monocytes Relative: 4 % (ref 3–12)
Neutro Abs: 9.2 10*3/uL — ABNORMAL HIGH (ref 1.7–7.7)
Neutrophils Relative %: 88 % — ABNORMAL HIGH (ref 43–77)

## 2010-11-09 LAB — PROTIME-INR
INR: 1.05 (ref 0.00–1.49)
Prothrombin Time: 13.6 seconds (ref 11.6–15.2)

## 2010-11-09 LAB — URINALYSIS, ROUTINE W REFLEX MICROSCOPIC
Glucose, UA: NEGATIVE mg/dL
Hgb urine dipstick: NEGATIVE
Protein, ur: NEGATIVE mg/dL
pH: 5 (ref 5.0–8.0)

## 2010-11-09 LAB — ABO/RH: ABO/RH(D): O POS

## 2010-11-09 LAB — COMPREHENSIVE METABOLIC PANEL
Alkaline Phosphatase: 63 U/L (ref 39–117)
BUN: 44 mg/dL — ABNORMAL HIGH (ref 6–23)
GFR calc non Af Amer: >60 mL/min (ref >60)
Glucose, Bld: 191 mg/dL — ABNORMAL HIGH (ref 70–99)
Potassium: 3.9 mEq/L (ref 3.5–5.1)
Total Protein: 6.3 g/dL (ref 6.0–8.3)

## 2010-11-09 LAB — POCT CARDIAC MARKERS
CKMB, poc: 2.5 ng/mL (ref 1.0–8.0)
Myoglobin, poc: 129 ng/mL (ref 12–200)
Troponin i, poc: <0.05 ng/mL (ref 0.00–0.09)

## 2010-11-09 LAB — APTT: aPTT: 26 seconds (ref 24–37)

## 2010-11-09 LAB — TSH: TSH: 2.218 u[IU]/mL (ref 0.350–4.500)

## 2010-11-09 LAB — BASIC METABOLIC PANEL WITH GFR
BUN: 16 mg/dL (ref 6–23)
CO2: 26 meq/L (ref 19–32)
Calcium: 8.5 mg/dL (ref 8.4–10.5)
Chloride: 112 meq/L (ref 96–112)
Creatinine, Ser: 0.76 mg/dL (ref 0.4–1.5)
GFR calc non Af Amer: >60 mL/min
Glucose, Bld: 90 mg/dL (ref 70–99)
Potassium: 3.8 meq/L (ref 3.5–5.1)
Sodium: 141 meq/L (ref 135–145)

## 2010-11-09 LAB — PHOSPHORUS: Phosphorus: 2.7 mg/dL (ref 2.3–4.6)

## 2010-11-09 LAB — CROSSMATCH: ABO/RH(D): O POS

## 2011-01-06 NOTE — Assessment & Plan Note (Signed)
[facility]                            CARDIOLOGY OFFICE NOTE   NAME:Raymond Swanson, Raymond Swanson                       MRN:          [id number]  DATE:05/29/2008                            DOB:          [birth year]    PRIMARY CARE PHYSICIAN:  Gordy Savers, MD   REASON FOR PRESENTATION:  Evaluate the patient with coronary artery  disease.   HISTORY OF PRESENT ILLNESS:  The patient is now 77 years old.  He has  done well since I last saw him.  He returns for yearly followup.  He is  actually not having any new symptoms.  Last year, he was more fatigued  and battling shingles.  This year, he does have an elevated PSA and has  been for scans to evaluate this.  He says he has something in his  head.  He has not been having any new cardiovascular symptoms.  He  denies any shortness of breath, PND, or orthopnea.  He has had no  palpitation, presyncope, or syncope.  He denies any chest discomfort.   PAST MEDICAL HISTORY:  Coronary artery disease status post CABG (LIMA to  the LAD, SVG to the right coronary artery, and SVG to obtuse marginal in  1991), dyslipidemia, prostate cancer status post radical prostatectomy,  right inguinal hernia repair, skin cancer resected, and herpes zoster.   ALLERGIES:  None.   MEDICATIONS:  1. Aspirin 325 mg daily.  2. Ocuvite.  3. Multivitamin.  4. Calcium.  5. Fresh tears.  6. Vytorin 10/20 daily.  7. Levoxyl 50 mcg daily.   REVIEW OF SYSTEMS:  As stated in the HPI and negative for other systems.   PHYSICAL EXAMINATION:  GENERAL:  The patient is pleasant and in no  distress.  VITAL SIGNS:  Blood pressure 140/70, heart rate 81 and regular, weight  148 pounds, and body mass index is 22.  HEENT:  Eyes are unremarkable.  Pupils are equal, round, and reactive to  light, fundi not visualized, oral mucosa unremarkable.  NECK:  No jugular venous distention at 45 degrees, carotid upstroke  brisk and symmetrical, no bruits, no  thyromegaly.  LYMPHATICS:  No cervical, axillary, or inguinal adenopathy.  LUNGS:  Clear to auscultation bilaterally.  BACK:  No costovertebral angle tenderness.  CHEST:  Well-healed sternotomy scar.  HEART:  PMI not displaced or sustained, S1 and S2 within normal limits,  no S3, no S4, no clicks, no rubs, no murmurs.  ABDOMEN:  Flat, positive  bowel sounds.  Normal in frequency and pitch, no bruits, no rebound, no  guarding, no midline pulsatile mass.  No organomegaly.  SKIN:  No rashes, no nodules.  EXTREMITIES:  Pulses are 2+, no edema.   EKG; sinus rhythm, rate 82, axis within normal limits, intervals within  normal limits, premature ventricular contraction, no acute ST-wave  changes.   ASSESSMENT AND PLAN:  1. Coronary artery disease.  The patient is having no new symptoms.      No further cardiovascular testing is suggested.  It has only been 2      years since his  last stress test, so I will probably wait one more      year before repeating this.  He does now have 75 year old bypass      grafts.  2. Dyslipidemia.  I reviewed his lipid profile.  It is excellent.  He      remain on the meds as listed.  3. Hypertension.  Blood pressures at the upper limits are normal      today.  Reviewing previous readings, it is fine.  I will not make      any changes.  4. Followup.  I will see back in 1 year or sooner if needed.     Rollene Rotunda, MD, [facility]  Electronically Signed    JH/MedQ  DD: 05/29/2008  DT: 05/30/2008  Job #: [id number]   cc:   Gordy Savers, MD

## 2011-01-06 NOTE — Assessment & Plan Note (Signed)
[facility]                            CARDIOLOGY OFFICE NOTE   NAME:Raymond Swanson, Raymond Swanson                       MRN:          [id number]  DATE:05/24/2007                            DOB:          1924-10-28    PRIMARY CARE PHYSICIAN:  Gordy Savers. M.D.   REASON FOR PRESENTATION:  Evaluate patient with coronary disease.   HISTORY OF PRESENT ILLNESS:  The patient is now 77 years old.  He  returns for yearly followup.  He is doing well since I last saw him.  He  continues to have progressive fatigue.  Particularly he has been  fatigued since battling a severe case of shingles last year.  He has had  no new cardiac complaints, however.  He is not describing any chest  discomfort, neck or arm discomfort.  He has not been having any  palpitations, pre-syncope or syncope.  He denies any PND or orthopnea.  He still walks daily and exercises on exercise machines.   PAST MEDICAL/SURGICAL HISTORY:  1. Coronary artery disease, status post coronary artery bypass graft      surgery with the LIMA to the LAD, SVG to the right coronary artery      and SVG to the obtuse marginal in 1991.  2. Dyslipidemia.  3. Prostate cancer, status post radical prostatectomy.  4. Right inguinal hernia repair.  5. Skin cancer resected.  6. Herpes zoster.   ALLERGIES:  No known drug allergies.   MEDICAL DECISION MAKING:  1. Aspirin 325 mg d  2. Vytorin 10/20 mg daily.  3. Ocuvite.  4. Multivitamin.  5. Calcium.  6. Fresh Tears.   REVIEW OF SYSTEMS:  As stated in the HPI and otherwise negative for  other systems.   PHYSICAL EXAMINATION:  GENERAL:  The patient is in no distress.  VITAL SIGNS:  Blood pressure 130/74, heart rate 70 and regular, weight  149 pounds, body mass index 22.  HEENT:  Eyes unremarkable.  Pupils equal, round, reactive to light.  Fundi not visualized.  Oral mucosa unremarkable.  NECK:  No jugular venous distention at 45 degrees.  Carotid upstroke  brisk and symmetrical.  No bruits, no thyromegaly.  LYMPHATICS:  No cervical, axillary or inguinal adenopathy.  LUNGS:  Clear to auscultation bilaterally.  BACK:  No costovertebral angle tenderness.  CHEST:  Unremarkable.  HEART:  PMI not displaced or sustained.  S1 and S2 within normal limits.  No S3, no S4, no clicks, rubs or murmurs.  ABDOMEN:  Positive bowel sounds.  Normal frequency and pitch.  No  bruits, no rebound, no guarding, no midline pulsatile mass, no  organomegaly.  SKIN:  No rashes, no nodules.  EXTREMITIES:  With 2+ pulses.  No edema.   Electrocardiogram:  Sinus rhythm, rate 70, axis within normal limits,  intervals within normal limits.  No acute ST-T wave changes.   ASSESSMENT/PLAN:  1. Coronary artery disease:  The patient is having no new symptoms.      He had a stress perfusion study which was negative for any high      risk findings.  There was possible mild inferior infarction with      mild peri-infarction ischemia.  His ejection fraction was well-      preserved.  This was last year.  He has had no new symptoms.  Based      on this, he will continue with secondary risk reduction.  2. Dyslipidemia:  He is having this followed by Dr. Gordy Savers.  The goal will be an LDL less than 100 and an HDL in      the 40s.  3. Hypertension:  The blood pressure is well-controlled.  He will      obtain the medications as listed.   FOLLOWUP:  I will see the patient back in one year or sooner if needed.     Rollene Rotunda, MD, [facility]  Electronically Signed    JH/MedQ  DD: 05/24/2007  DT: 05/25/2007  Job #: [id number]   cc:   Gordy Savers, MD

## 2011-01-09 NOTE — Assessment & Plan Note (Signed)
[place] [facility]                            [place] OFFICE NOTE   NAME:Raymond Swanson, Raymond Swanson                       MRN:          [id number]  DATE:10/12/2006                            DOB:          Jan 16, 1925    An 77 year old gentleman seen today for a wellness exam, he has history  of coronary artery disease status post CABG in 1991. He has had a recent  stress Myoview with negative results. In 1990 he underwent prostatectomy  by Dr. Logan Bores.  He had a hernia repair in 1994. Last year he required  Mohs surgery for a basal cell cancer involving the left ear and also  bilateral cataract extraction surgery. He is doing well, does complain  of some increasing fatigue but is recovering from some shingles.   FAMILY HISTORY:  Reviewed, basically unchanged.   Exam revealed an elderly gentleman in no acute distress, blood pressure  was low normal, weight stable at 144.  HEAD AND NECK: Revealed normal fundi.  Ear, nose, and throat clear.  NECK: No bruits.  CHEST: Clear.  CARDIOVASCULAR EXAM: Normal heart sounds, no murmurs.  ABDOMEN: Surgical scars noted. No organomegaly.  EXTERNAL GENITALIA: Unremarkable.  RECTAL EXAM: Revealed heme-negative stool, prostate surgically absent.  EXTREMITIES: Revealed full peripheral pulses.  NEURO: Negative.   IMPRESSION:  Coronary artery disease, dyslipidemia, hypertension, remote  prostate cancer.   DISPOSITION:  Medical regimen unchanged. Laboratory studies were  reviewed. Prescriptions dispensed.  Will recheck in 1 year.     Gordy Savers, MD  Electronically Signed    PFK/MedQ  DD: 10/12/2006  DT: 10/12/2006  Job #: [id number]

## 2011-01-09 NOTE — Op Note (Signed)
[place]. [facility]  Patient:    Raymond Swanson, Raymond Swanson                       MRN: [id number] Proc. Date: 05/27/00 Adm. Date:  [id number] Attending:  Melody Comas.                           Operative Report  PREOPERATIVE DIAGNOSES 1. Basal cell carcinoma of right forehead, 14 mm. 2. Basal cell carcinoma of left shoulder, 18 mm.  POSTOPERATIVE DIAGNOSES 1. Basal cell carcinoma of right forehead, 14 mm. 2. Basal cell carcinoma of left shoulder, 18 mm.  PROCEDURE PERFORMED 1. Excisional biopsy of 14-mm basal cell carcinoma of the right forehead. 2. Excisional biopsy of 18-mm basal cell carcinoma of the left shoulder.  SURGEON:  Janet Berlin. Dan Humphreys, M.D.  FIRST ASSISTANT:  None.  ANESTHESIA:  Local.  INDICATIONS:  Raymond Swanson is a 77 year old man that has undergone biopsy of three lesions by Dr. Theodora Blow. Danella Deis.  On examination today, prior to performing his procedures, I was unable to clearly see the involved area on the left ear.  The area that looks a little scaly to me is different than an area that he points out where the biopsy was taken, which looks very normal. Accordingly, we will proceed with excisional biopsies of lesions of the forehead and left shoulder which are clearly demarcated.  DESCRIPTION OF OPERATION:  The patient was brought back to the minor surgery operating room in the [facility].  He was placed on the table in a supine position with the head turned to the left.  I sterilely prepped and draped the right forehead.  I designed an elliptical-shaped excision with grossly clear tissue margins.  After injecting 1% Xylocaine with epinephrine for local anesthetic effect, the lesion was excised full-thickness.  I tagged the right lateral end of the specimen with a suture to orient it for the pathologist and passed the specimen off the operative field.  The resulting defect was closed in layers.  I used interrupted,  buried dermal sutures of 4-0 Vicryl, followed by a running monofilament of 5-0 nylon suture for final epidermal leveling.  Attention was then directed to the point of the left shoulder.  This site was similarly prepped and draped as previously described.  Again, I designed an elliptical-shaped excision in the long axis of the upper extremity and shoulder.  Local anesthetic was infiltrated as before.  This lesion was also excised full-thickness.  The left lateral end of the specimen was tagged with a suture, again for pathologic orientation; this specimen was also passed off the operative field.  I closed this defect in layers as well and in a fashion identical to that previously described.  Both wound sites were sterilely dressed.  The patient was given wound care instructions and will return to my office next week for wound check, suture removal and review of his final pathology report. DD:  05/27/00 TD:  05/28/00 Job: [id number]

## 2011-01-09 NOTE — Op Note (Signed)
[place]  Patient:    Raymond Swanson, Raymond Swanson                       MRN: [id number] Proc. Date: 02/11/01 Adm. Date:  [id number] Attending:  Melody Comas.                           Operative Report  PREOPERATIVE DIAGNOSES: 1. 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 2. Skin lesion, left posterior shoulder, suspicious for squamous cell    carcinoma, 1.5 cm. 3. 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 4. Skin lesion of right lower helical rim, 1 cm, suspicious for Bowens    disease.  POSTOPERATIVE DIAGNOSES:  Pending final pathology: 1. 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 2. Skin lesion, left posterior shoulder, suspicious for squamous cell    carcinoma, 1.5 cm. 3. 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 4. Skin lesion of right lower helical rim, 1 cm, suspicious for Bowens    disease.  PROCEDURES: 1. Excisional biopsy of 1.5 cm squamous cell carcinoma of the left posterior    shoulder. 2. Excisional biopsy of 1.5 cm skin lesion of the left posterior shoulder. 3. Excisional biopsy of 1 cm area of Bowens disease, right helical rim. 4. Excisional biopsy of 1 cm skin lesion, lower right helical rim.  SURGEON:  Janet Berlin. Dan Humphreys, M.D.  ANESTHESIA:  Local.  INDICATIONS:  Mr. Raymond Swanson is a 77 year old gentleman who has had two skin lesions recently biopsied by [facility]. Danella Deis, M.D.  The one of the back was interpreted as showing squamous cell carcinoma and of the upper helical rim of the right ear showed Bowens disease.  He has a separate lesion in the immediate vicinity of the ones that have already been biopsied, whose appearance suggests precisely the same process.  He will undergo excisional biopsies of these areas as well.  DESCRIPTION OF PROCEDURE:  The patient was brought to the minor surgery operating room in the [facility] and placed on the table in a  supine position with the head turned to the left.  The right ear was sterilely prepped and draped.  I used a sterile surgical marking pen to outline the 1 cm area of Bowens disease along the upper portion of the right helical rim as well as the second area, located more inferiorly.  After infiltrating with local anesthetic, each of these areas was excised full-thickness, the specimens marked as to the point of origin, and forwarded off the operative field for permanent sections.  The resulting defects were closed with running sutures of 5-0 nylon.  Attention was then directed to the left posterior shoulder.  In order to facilitate exposure, the patient was placed in the right lateral decubitus position.  I sterilely prepped and draped the left posterior shoulder.  A sterile surgical marking pen was used to elliptically outline the excisional biopsies with grossly clear tissue margins.  Each of these lesions was approximately 1.5 cm in maximal diameter.  After infiltrating local anesthetic, each of the areas was excised full-thickness, marked as to point of origin, and forwarded for permanent sections.  The resulting defect was closed in layers.  Interrupted, buried dermal sutures of Vicryl were placed, followed by a running 5-0 or 4-0 nylon suture for final epidermal leveling. Suture of 5-0 was used in  the more lateral of the two lesions and 4-0 in the more medial of the two lesions.  Sterile dressings were applied and immobilized into position with tape.  The patient was given wound care instructions, and we will see him in the office late next week for wound check, suture removal, and review of his final pathology report. DD:  02/11/01 TD:  02/12/01 Job: [id number]

## 2011-01-09 NOTE — Op Note (Signed)
[place]. [facility]  Patient:    Raymond Swanson, Raymond Swanson                       MRN: [id number] Proc. Date: 10/27/00 Adm. Date:  [id number] Attending:  Melody Comas.                           Operative Report  PREOPERATIVE DIAGNOSIS:  Bowens disease of the left antihelical rim (two sites).  POSTOPERATIVE DIAGNOSIS:  Bowens disease of the left antihelical rim (two sites).  OPERATION PERFORMED: 1. Excisional biopsy performed of Bowens disease of left antihelical rim    (two confluent sites). 2. Reconstruction of open left ear defect with full thickness skin graft    harvested from the left preauricular area.  SURGEON:  Janet Berlin. Dan Humphreys, M.D.  ASSISTANT:  None.  ANESTHESIA:  Local.  INDICATIONS FOR PROCEDURE:  Mr. Maxson Oddo is a 77 year old gentleman with biopsy proven Bowens disease of the left antihelical rim.  He saw Dr. Danella Deis earlier who definitively marked the spot for me.  She also marked a spot anterior to the area for which he had been referred which she felt was very suspicious for Bowens disease.  As these areas are located side by side, my plan is to excise these in one specimen.  DESCRIPTION OF PROCEDURE:  The patient was brought to the operating room and placed on the table in the supine position with the head turned to the right. The left ear and preauricular area were sterilely prepped and draped.  I infiltrated the site of Bowens disease as well as the skin graft donor site in the immediate preauricular area with 1% Xylocaine containing epinephrine. The specimen was then excised with grossly clear tissue margins.  The size of the specimen was approximately 1.7 cm.  I oriented the specimen by placing a suture at its anteriormost point for review by the pathologist.  Attention was then directed to the left preauricular area immediately in front of the tragus.  I excised an elliptically shaped piece of skin in a full thickness fashion.  The  resulting defect was closed with both simple, interrupted as well as running 5-0 Monofilament sutures.  The full thickness skin graft was then defatted.  The graft was then sutured into position with interrupted 5-0 nylon sutures.  I cut the graft to precisely fit the recipient defects.  This sewn in under very mild tension so as not to require a tie-over bolster dressing.  The skin graft site was then dressed with a light coating of bacitracin ointment.  The preauricular sutured wound was left open.  The patient was given wound care instructions and will see me in the office in one week for a wound check, suture removal and review of his final pathology report. DD:  10/27/00 TD:  10/27/00 Job: [id number]

## 2011-01-09 NOTE — Assessment & Plan Note (Signed)
[facility]                              CARDIOLOGY OFFICE NOTE   NAME:PRUEAamari, Strawderman                       MRN:          [id number]  DATE:05/11/2006                            DOB:          1924-09-29    PRIMARY:  Gordy Savers, M.D.   REASON FOR PRESENTATION:  Evaluate patient with coronary disease.   HISTORY OF PRESENT ILLNESS:  The patient is now 77 years old.  He has a  history of coronary disease status post CABG.  He presents with slowly  progressive dyspnea on exertion.  He has noticed this over the past year.  He gets dyspneic doing moderate exertion though he is still able to be  active in his yard.  He does walk a mile, though it is now a little bit more  difficult for him and he does it more slowly.  He gets short of breath  particularly at the beginning but feels a little bit better as he goes on.  He has had more fatigue.  He denies any chest discomfort, neck discomfort,  arm discomfort, activity-induced nausea or vomiting, excessive diaphoresis.  He has not noticed any palpitations, PND, or orthopnea.   PAST MEDICAL HISTORY:  1. Coronary artery disease (status post CABG with a LIMA to the LAD, SVG      to right coronary artery and SVG to obtuse marginal in 1991).  2. Hyperlipidemia.  3. Prostate cancer status post radical prostatectomy.  4. Right inguinal hernia repair.  5. Skin cancer resected.  6. Dyslipidemia.   ALLERGIES:  None.   CURRENT MEDICATIONS:  1. Aspirin 325 mg daily.  2. Vytorin 20/10 daily.  3. Ocuvite.  4. Multivitamin.  5. Calcium.  6. Fresh Tears.   REVIEW OF SYSTEMS:  As stated in the HPI and otherwise negative for other  systems.   PHYSICAL EXAMINATION:  GENERAL:  The patient is in no distress.  VITAL SIGNS:  Blood pressure 140/72, heart rate 67 and regular, weight 146  pounds, body mass index 22.  HEENT:  Eyelids unremarkable.  Pupils equal, round, and reactive to light.  Fundi not  visualized.  Oral mucosa unremarkable.  NECK:  No jugular venous distention at 45 degrees.  Carotid upstroke brisk  and symmetric.  No bruits, no thyromegaly.  LYMPHATICS:  No cervical, axillary or inguinal adenopathy.  LUNGS:  Clear to auscultation bilaterally.  BACK:  No costovertebral angle tenderness.  CHEST:  Well-healed sternotomy scar.  HEART:  PMI not displaced or sustained.  S1 and S2 within normal limits.  No  S3, no S4, no murmurs.  ABDOMEN:  Flat, positive bowel sounds normal in frequency and pitch.  No  bruits, rebound, guarding.  No midline pulsatile mass, hepatomegaly,  splenomegaly.  SKIN:  No rashes, no nodules.  EXTREMITIES:  Show 2+ pulses throughout.  No edema, cyanosis, clubbing.  NEUROLOGIC:  Oriented to person, place and time.  Cranial nerves II-XII  grossly intact.  Motor grossly intact.   EKG:  Sinus rhythm, rate 67, axis within normal limits, intervals within  normal limits, no acute ST-T  wave changes.   ASSESSMENT AND PLAN:  1. Dyspnea.  The patient has dyspnea, known coronary disease, and bypass      grafts that are now 75 years old.  Given all of this, he has a high      pretest probability for recurrent obstructive coronary disease or graft      failure.  I am going to go ahead and get a stress perfusion study to      further evaluate this.  He will continue with risk reduction.  2. Dyslipidemia.  An excellent lipid profile and he has this followed by      Dr. Amador Cunas.  He will continue on the medications as listed.  3. Hypertension.  Blood pressure is borderline but has been well      controlled otherwise outside of this appointment.  He will continue      medications as listed.  4. Followup.  I will see him back in 1 year if his stress perfusion study      is all right.  Will see him sooner if there are any further problems or      any abnormality on his stress test.                               Rollene Rotunda, MD, [facility]    JH/MedQ  DD:   05/11/2006  DT:  05/12/2006  Job #:  [id number]   cc:   Gordy Savers, MD

## 2011-05-05 ENCOUNTER — Encounter: Payer: Self-pay | Admitting: Internal Medicine

## 2011-05-05 ENCOUNTER — Ambulatory Visit (INDEPENDENT_AMBULATORY_CARE_PROVIDER_SITE_OTHER): Payer: Medicare Other | Admitting: Internal Medicine

## 2011-05-05 VITALS — BP 130/80 | Temp 97.7°F | Wt 150.0 lb

## 2011-05-05 DIAGNOSIS — Z8546 Personal history of malignant neoplasm of prostate: Secondary | ICD-10-CM

## 2011-05-05 DIAGNOSIS — E039 Hypothyroidism, unspecified: Secondary | ICD-10-CM

## 2011-05-05 DIAGNOSIS — E785 Hyperlipidemia, unspecified: Secondary | ICD-10-CM

## 2011-05-05 DIAGNOSIS — I10 Essential (primary) hypertension: Secondary | ICD-10-CM

## 2011-05-05 DIAGNOSIS — Z23 Encounter for immunization: Secondary | ICD-10-CM

## 2011-05-05 DIAGNOSIS — I2510 Atherosclerotic heart disease of native coronary artery without angina pectoris: Secondary | ICD-10-CM

## 2011-05-05 DIAGNOSIS — Z0000 Encounter for general adult medical examination without abnormal findings: Secondary | ICD-10-CM

## 2011-05-05 NOTE — Progress Notes (Signed)
  Subjective:    Patient ID: Raymond Swanson, male    DOB: 1925/01/25, 77 y.o.   MRN: [id number]  HPI  77 year old patient who is in today for followup he is followed closely by urology due to prostate cancer. He states his PSA recently has increased to 16. He feels well. A recent bone scan unchanged and normal. He has a history of hypertension and coronary artery disease. He denies any exertional chest pain He has hypothyroidism as well as dyslipidemia. He remains on simvastatin 20 mg daily which he tolerates well. There is remote history of gastric ulcer disease and mild. Denies any change in his bowel habits    Review of Systems  Constitutional: Negative for fever, chills, appetite change and fatigue.  HENT: Negative for hearing loss, ear pain, congestion, sore throat, trouble swallowing, neck stiffness, dental problem, voice change and tinnitus.   Eyes: Negative for pain, discharge and visual disturbance.  Respiratory: Negative for cough, chest tightness, wheezing and stridor.   Cardiovascular: Negative for chest pain, palpitations and leg swelling.  Gastrointestinal: Negative for nausea, vomiting, abdominal pain, diarrhea, constipation, blood in stool and abdominal distention.  Genitourinary: Negative for urgency, hematuria, flank pain, discharge, difficulty urinating and genital sores.  Musculoskeletal: Negative for myalgias, back pain, joint swelling, arthralgias and gait problem.  Skin: Negative for rash.  Neurological: Negative for dizziness, syncope, speech difficulty, weakness, numbness and headaches.  Hematological: Negative for adenopathy. Does not bruise/bleed easily.  Psychiatric/Behavioral: Negative for behavioral problems and dysphoric mood. The patient is not nervous/anxious.        Objective:   Physical Exam  Constitutional: He is oriented to person, place, and time. He appears well-developed.  HENT:  Head: Normocephalic.  Right Ear: External ear normal.  Left Ear:  External ear normal.  Eyes: Conjunctivae and EOM are normal.  Neck: Normal range of motion.  Cardiovascular: Normal rate and normal heart sounds.   Pulmonary/Chest: Breath sounds normal.  Abdominal: Bowel sounds are normal.  Musculoskeletal: Normal range of motion. He exhibits no edema and no tenderness.  Neurological: He is alert and oriented to person, place, and time.  Psychiatric: He has a normal mood and affect. His behavior is normal.          Assessment & Plan:   Hypertension. Well controlled Coronary artery disease stable Prostate cancer. Followup urology Dyslipidemia Hypothyroidism  Medical regimen unchanged. Medicines refilled we'll see in 6 months for his annual exam

## 2011-05-05 NOTE — Patient Instructions (Signed)
It is important that you exercise regularly, at least 20 minutes 3 to 4 times per week.  If you develop chest pain or shortness of breath seek  medical attention.  Limit your sodium (Salt) intake  Return in 6 months for follow-up  

## 2011-06-16 ENCOUNTER — Encounter: Payer: Self-pay | Admitting: Cardiology

## 2011-06-16 ENCOUNTER — Ambulatory Visit (INDEPENDENT_AMBULATORY_CARE_PROVIDER_SITE_OTHER): Payer: Medicare Other | Admitting: Cardiology

## 2011-06-16 DIAGNOSIS — I2510 Atherosclerotic heart disease of native coronary artery without angina pectoris: Secondary | ICD-10-CM

## 2011-06-16 DIAGNOSIS — E785 Hyperlipidemia, unspecified: Secondary | ICD-10-CM

## 2011-06-16 DIAGNOSIS — I10 Essential (primary) hypertension: Secondary | ICD-10-CM

## 2011-06-16 NOTE — Assessment & Plan Note (Signed)
The patient has no new sypmtoms.  No further cardiovascular testing is indicated.  We will continue with aggressive risk reduction and meds as listed. His last stress test was in 2010.  I will consider follow up stress testing next year as his grafts are greater than 75 years old.

## 2011-06-16 NOTE — Assessment & Plan Note (Signed)
His BP at home is 125 systolic.  No change in therapy is indicated.

## 2011-06-16 NOTE — Patient Instructions (Signed)
The current medical regimen is effective;  continue present plan and medications.  Follow up in 1 year with Dr Hochrein.  You will receive a letter in the mail 2 months before you are due.  Please call us when you receive this letter to schedule your follow up appointment.  

## 2011-06-16 NOTE — Assessment & Plan Note (Signed)
His LDL was 74 and HDL was 43.4.  He will remain on the meds as listed.

## 2011-06-16 NOTE — Progress Notes (Signed)
HPI The patient presents for follow up of CAD.  Since I last saw him he has done well.  The patient denies any new symptoms such as chest discomfort, neck or arm discomfort. There has been no new shortness of breath, PND or orthopnea. There have been no reported palpitations, presyncope or syncope.  He does have some fatigue and weakness but this is not new.  He still walks daily.  No Known Allergies  Current Outpatient Prescriptions  Medication Sig Dispense Refill  . aspirin 81 MG tablet Take 81 mg by mouth daily.        . calcium gluconate 500 MG tablet Take 500 mg by mouth daily.        . cholecalciferol (VITAMIN D) 1000 UNITS tablet Take 1,000 Units by mouth daily.        . diphenhydramine-acetaminophen (TYLENOL PM) 25-500 MG TABS Take 1 tablet by mouth at bedtime as needed.        . eszopiclone (LUNESTA) 2 MG TABS Take 1 tablet (2 mg total) by mouth at bedtime as needed. Take immediately before bedtime  30 tablet  2  . fluorouracil (EFUDEX) 5 % cream Apply topically 2 (two) times daily.  40 g  0  . levothyroxine (SYNTHROID, LEVOTHROID) 50 MCG tablet Take 1 tablet (50 mcg total) by mouth daily.  90 tablet  6  . Multiple Vitamin (MULTIVITAMIN) tablet Take 1 tablet by mouth daily.        . Multiple Vitamins-Minerals (PRESERVISION/LUTEIN PO) Take by mouth. Daily         . nitroGLYCERIN (NITROSTAT) 0.4 MG SL tablet Place 1 tablet (0.4 mg total) under the tongue every 5 (five) minutes as needed.  90 tablet  6  . simvastatin (ZOCOR) 20 MG tablet Take 1 tablet (20 mg total) by mouth daily.  90 tablet  6    Past Medical History  Diagnosis Date  . Cancer   . ACUT GASTR ULCER W/HEMORR W/O MENTION OBST 09/10/2009  . ANEMIA 10/08/2009  . CORONARY ARTERY DISEASE 01/26/2007  . HYPERLIPIDEMIA 01/26/2007  . HYPERTENSION 01/26/2007  . HYPOTHYROIDISM 10/18/2007  . MELENA 08/28/2009  . PROSTATE CANCER, HX OF 01/26/2007  . SKIN CANCER, HX OF 01/26/2007    Past Surgical History  Procedure Date  . Coronary  artery bypass graft   . Hernia repair     ingunial  . Prostate surgery     prostatectomy  . Mohs surgery   . Cataract extraction     ROS:  As stated in the HPI and negative for all other systems.  PHYSICAL EXAM BP 157/79  Pulse 72  Resp 18  Ht 5\' 8"  (1.727 m)  Wt 148 lb 1.9 oz (67.187 kg)  BMI 22.52 kg/m2 GENERAL:  Well appearing HEENT:  Pupils equal round and reactive, fundi not visualized, oral mucosa unremarkable NECK:  No jugular venous distention, waveform within normal limits, carotid upstroke brisk and symmetric, no bruits, no thyromegaly LYMPHATICS:  No cervical, inguinal adenopathy LUNGS:  Clear to auscultation bilaterally BACK:  No CVA tenderness CHEST:  Well healed sternotomy scar. HEART:  PMI not displaced or sustained,S1 and S2 within normal limits, no S3, no S4, no clicks, no rubs, no murmurs ABD:  Flat, positive bowel sounds normal in frequency in pitch, no bruits, no rebound, no guarding, no midline pulsatile mass, no hepatomegaly, no splenomegaly EXT:  2 plus pulses throughout, no edema, no cyanosis no clubbing SKIN:  No rashes no nodules NEURO:  Cranial nerves II through  XII grossly intact, motor grossly intact throughout PSYCH:  Cognitively intact, oriented to person place and time  EKG:  Sinus rhythm, rate 65, axis within normal limits, intervals within normal limits, no acute ST-T wave changes. 06/16/2011   ASSESSMENT AND PLAN

## 2011-11-03 ENCOUNTER — Other Ambulatory Visit (HOSPITAL_COMMUNITY): Payer: Self-pay | Admitting: Urology

## 2011-11-03 ENCOUNTER — Other Ambulatory Visit (INDEPENDENT_AMBULATORY_CARE_PROVIDER_SITE_OTHER): Payer: Medicare Other

## 2011-11-03 DIAGNOSIS — I10 Essential (primary) hypertension: Secondary | ICD-10-CM

## 2011-11-03 DIAGNOSIS — Z8546 Personal history of malignant neoplasm of prostate: Secondary | ICD-10-CM

## 2011-11-03 DIAGNOSIS — Z0000 Encounter for general adult medical examination without abnormal findings: Secondary | ICD-10-CM

## 2011-11-03 DIAGNOSIS — C61 Malignant neoplasm of prostate: Secondary | ICD-10-CM

## 2011-11-03 DIAGNOSIS — I2510 Atherosclerotic heart disease of native coronary artery without angina pectoris: Secondary | ICD-10-CM

## 2011-11-03 LAB — HEPATIC FUNCTION PANEL
AST: 26 U/L (ref 0–37)
Albumin: 3.8 g/dL (ref 3.5–5.2)
Alkaline Phosphatase: 69 U/L (ref 39–117)
Bilirubin, Direct: 0.2 mg/dL (ref 0.0–0.3)
Total Protein: 5.8 g/dL — ABNORMAL LOW (ref 6.0–8.3)

## 2011-11-03 LAB — POCT URINALYSIS DIPSTICK
Blood, UA: NEGATIVE
Glucose, UA: NEGATIVE
Leukocytes, UA: NEGATIVE
Nitrite, UA: NEGATIVE
Urobilinogen, UA: 0.2
pH, UA: 6.5

## 2011-11-03 LAB — BASIC METABOLIC PANEL
CO2: 31 mEq/L (ref 19–32)
Calcium: 9.4 mg/dL (ref 8.4–10.5)
Creatinine, Ser: 1.1 mg/dL (ref 0.4–1.5)
GFR: 65.53 mL/min (ref >60.00)
Sodium: 140 mEq/L (ref 135–145)

## 2011-11-03 LAB — LIPID PANEL
HDL: 51.9 mg/dL (ref >39.00)
LDL Cholesterol: 85 mg/dL (ref 0–99)
Total CHOL/HDL Ratio: 3
Triglycerides: 85 mg/dL (ref 0.0–149.0)

## 2011-11-03 LAB — CBC WITH DIFFERENTIAL/PLATELET
Basophils Relative: 0.4 % (ref 0.0–3.0)
Eosinophils Absolute: 0.1 10*3/uL (ref 0.0–0.7)
Hemoglobin: 15 g/dL (ref 13.0–17.0)
Lymphocytes Relative: 22.2 % (ref 12.0–46.0)
MCHC: 33.3 g/dL (ref 30.0–36.0)
Monocytes Relative: 11.7 % (ref 3.0–12.0)
Neutro Abs: 3.4 10*3/uL (ref 1.4–7.7)
Neutrophils Relative %: 63.3 % (ref 43.0–77.0)
RBC: 4.8 Mil/uL (ref 4.22–5.81)
WBC: 5.4 10*3/uL (ref 4.5–10.5)

## 2011-11-10 ENCOUNTER — Encounter: Payer: Self-pay | Admitting: Internal Medicine

## 2011-11-10 ENCOUNTER — Ambulatory Visit (INDEPENDENT_AMBULATORY_CARE_PROVIDER_SITE_OTHER): Payer: Medicare Other | Admitting: Internal Medicine

## 2011-11-10 VITALS — BP 120/70 | Temp 98.0°F | Ht 67.5 in | Wt 150.0 lb

## 2011-11-10 DIAGNOSIS — I10 Essential (primary) hypertension: Secondary | ICD-10-CM

## 2011-11-10 DIAGNOSIS — Z0000 Encounter for general adult medical examination without abnormal findings: Secondary | ICD-10-CM

## 2011-11-10 DIAGNOSIS — Z8546 Personal history of malignant neoplasm of prostate: Secondary | ICD-10-CM

## 2011-11-10 DIAGNOSIS — I2510 Atherosclerotic heart disease of native coronary artery without angina pectoris: Secondary | ICD-10-CM

## 2011-11-10 DIAGNOSIS — E039 Hypothyroidism, unspecified: Secondary | ICD-10-CM

## 2011-11-10 MED ORDER — FLUOROURACIL 5 % EX CREA: TOPICAL_CREAM | Freq: Two times a day (BID) | CUTANEOUS | Status: AC

## 2011-11-10 MED ORDER — SIMVASTATIN 20 MG PO TABS: 20.0000 mg | ORAL_TABLET | Freq: Every day | ORAL | Status: DC

## 2011-11-10 MED ORDER — NITROGLYCERIN 0.4 MG SL SUBL: 0.4000 mg | SUBLINGUAL_TABLET | SUBLINGUAL | Status: DC | PRN

## 2011-11-10 MED ORDER — LEVOTHYROXINE SODIUM 50 MCG PO TABS: 50.0000 ug | ORAL_TABLET | Freq: Every day | ORAL | Status: DC

## 2011-11-10 NOTE — Patient Instructions (Signed)
It is important that you exercise regularly, at least 20 minutes 3 to 4 times per week.  If you develop chest pain or shortness of breath seek  medical attention.  Followup urology and cardiology as scheduled  Limit your sodium (Salt) intake

## 2011-11-10 NOTE — Progress Notes (Signed)
Subjective:    Patient ID: Raymond Swanson, male    DOB: Jul 23, 1925, 78 y.o.   MRN: [id number]  HPI  78 year old patient who is seen today for a preventive health examination. He has a history of coronary artery disease status post CABG in 1991. He is followed by annually by cardiology. He continues to do well and walks 1 mile most days. He denies any exertional chest pain he does describe periods of some weakness and poor exercise capacity. In general he does quite well. He is being considered for a followup stress test in the fall. He is status post prostatectomy and is followed by urology with a history of increasing PSAs. He is scheduled for urology followup soon and a bone scan is planned. He denies any bone pain  Alcohol-Tobacco  Smoking Status: never   Allergies (verified):  No Known Drug Allergies   Past History:  Past Medical History:  Reviewed history from 09/10/2009 and no changes required.  Prostate cancer, hx of- history of increasing PSAs  Skin cancer, hx of Mohs L ear  Coronary artery disease status post CABG (LIMA to  the LAD, SVG to the right coronary artery, and SVG to obtuse marginal in  1991)  Hyperlipidemia  Hypertension  Hypothyroidism  History of diplopia 2009  history of gastric ulcer, secondary to aspirin, January 2011   Past Surgical History:  Coronary artery bypass graft 91  Inguinal herniorrhaphy 94  Prostatectomy 1990  MOHS left ear 8/07, left cheek [facility] March 2010  Cataract extraction  Sigmoidoscopy 1999  Cardiolite stress test September 2007, October 2010  upper panendoscopy January 2011   Family History:  Reviewed history from 10/18/2007 and no changes required.  father died age 50, pancreatic cancer  mother died age 82  One brother died age 82 lung cancer  4 sisters   Social History:  Reviewed history from 10/23/2008 and no changes required.  Retired  Married  remote tobacco useSmoking Status: never   1. Risk factors, based on past   M,S,F history-  patient has known coronary artery disease. Cardiovascular risk factors include hypertension dyslipidemia remote tobacco use  2.  Physical activities: He remains quite active for 78 years of age. He does walk 1 most days of the week. He performs calisthenics and also does some balance-type exercises.  3.  Depression/mood: No history of depression or mood disorder  4.  Hearing: Wears hearing aids bilaterally 5.  ADL's: Independent in all aspects of daily living  6.  Fall risk: Mild to moderate do to  age  39.  Home safety: No problems identified  8.  Height weight, and visual acuity; height and weight stable no change in visual acuity. Wears glasses  9.  Counseling: Heart healthy diet regular exercise encouraged  10. Lab orders based on risk factors: Laboratory profile reviewed unremarkable except for a PSA of 20  11. Referral : Followup urology and cardiology  12. Care plan: Consider follow stress test in the fall. Bone scan scheduled for the near future  13. Cognitive assessment: Alert and oriented normal affect. No cognitive dysfunction  Past Medical History  Diagnosis Date  . Cancer   . ACUT GASTR ULCER W/HEMORR W/O MENTION OBST 09/10/2009  . ANEMIA 10/08/2009  . CORONARY ARTERY DISEASE 01/26/2007  . HYPERLIPIDEMIA 01/26/2007  . HYPERTENSION 01/26/2007  . HYPOTHYROIDISM 10/18/2007  . MELENA 08/28/2009  . PROSTATE CANCER, HX OF 01/26/2007  . SKIN CANCER, HX OF 01/26/2007    History   Social History  .  Marital Status: Married    Spouse Name: N/A    Number of Children: N/A  . Years of Education: N/A   Occupational History  . Not on file.   Social History Main Topics  . Smoking status: Former Smoker -- 1.0 packs/day for 20 years    Types: Cigarettes    Quit date: 10/28/1948  . Smokeless tobacco: Never Used  . Alcohol Use: Yes  . Drug Use: No  . Sexually Active: Not on file   Other Topics Concern  . Not on file   Social History Narrative  . No narrative on  file    Past Surgical History  Procedure Date  . Coronary artery bypass graft   . Hernia repair     ingunial  . Prostate surgery     prostatectomy  . Mohs surgery   . Cataract extraction     Family History  Problem Relation Age of Onset  . Lung cancer Brother 82  . GI problems Sister     No Known Allergies  Current Outpatient Prescriptions on File Prior to Visit  Medication Sig Dispense Refill  . aspirin 81 MG tablet Take 81 mg by mouth daily.        . calcium gluconate 500 MG tablet Take 500 mg by mouth daily.        . cholecalciferol (VITAMIN D) 1000 UNITS tablet Take 1,000 Units by mouth daily.        . diphenhydramine-acetaminophen (TYLENOL PM) 25-500 MG TABS Take 1 tablet by mouth at bedtime as needed.        . eszopiclone (LUNESTA) 2 MG TABS Take 1 tablet (2 mg total) by mouth at bedtime as needed. Take immediately before bedtime  30 tablet  2  . levothyroxine (SYNTHROID, LEVOTHROID) 50 MCG tablet Take 1 tablet (50 mcg total) by mouth daily.  90 tablet  6  . Multiple Vitamin (MULTIVITAMIN) tablet Take 1 tablet by mouth daily.        . Multiple Vitamins-Minerals (PRESERVISION/LUTEIN PO) Take by mouth. Daily         . nitroGLYCERIN (NITROSTAT) 0.4 MG SL tablet Place 1 tablet (0.4 mg total) under the tongue every 5 (five) minutes as needed.  90 tablet  6  . simvastatin (ZOCOR) 20 MG tablet Take 1 tablet (20 mg total) by mouth daily.  90 tablet  6    BP 120/70  Temp(Src) 98 F (36.7 C) (Oral)  Ht 5' 7.5" (1.715 m)  Wt 150 lb (68.04 kg)  BMI 23.15 kg/m2  SpO2 95%        Review of Systems  Constitutional: Negative for fever, chills, activity change, appetite change and fatigue.  HENT: Negative for hearing loss, ear pain, congestion, rhinorrhea, sneezing, mouth sores, trouble swallowing, neck pain, neck stiffness, dental problem, voice change, sinus pressure and tinnitus.   Eyes: Negative for photophobia, pain, redness and visual disturbance.  Respiratory:  Negative for apnea, cough, choking, chest tightness, shortness of breath and wheezing.   Cardiovascular: Negative for chest pain, palpitations and leg swelling.  Gastrointestinal: Negative for nausea, vomiting, abdominal pain, diarrhea, constipation, blood in stool, abdominal distention, anal bleeding and rectal pain.  Genitourinary: Negative for dysuria, urgency, frequency, hematuria, flank pain, decreased urine volume, discharge, penile swelling, scrotal swelling, difficulty urinating, genital sores and testicular pain.  Musculoskeletal: Negative for myalgias, back pain, joint swelling, arthralgias and gait problem.  Skin: Negative for color change, rash and wound.  Neurological: Positive for weakness. Negative for dizziness, tremors, seizures,  syncope, facial asymmetry, speech difficulty, light-headedness, numbness and headaches.  Hematological: Negative for adenopathy. Does not bruise/bleed easily.  Psychiatric/Behavioral: Negative for suicidal ideas, hallucinations, behavioral problems, confusion, sleep disturbance, self-injury, dysphoric mood, decreased concentration and agitation. The patient is not nervous/anxious.        Objective:   Physical Exam  Constitutional: He appears well-developed and well-nourished.  HENT:  Head: Normocephalic and atraumatic.  Right Ear: External ear normal.  Left Ear: External ear normal.  Nose: Nose normal.  Mouth/Throat: Oropharynx is clear and moist.       Hearing aids bilaterally  Eyes: Conjunctivae and EOM are normal. Pupils are equal, round, and reactive to light. No scleral icterus.  Neck: Normal range of motion. Neck supple. No JVD present. No thyromegaly present.       Decreased left carotid upstroke but no bruit   Cardiovascular: Normal rate, normal heart sounds and intact distal pulses.  Exam reveals no gallop and no friction rub.   No murmur heard.      Frequent ectopics  Pulmonary/Chest: Effort normal and breath sounds normal. He exhibits  no tenderness.  Abdominal: Soft. Bowel sounds are normal. He exhibits no distension and no mass. There is no tenderness.  Genitourinary: Penis normal.  Musculoskeletal: Normal range of motion. He exhibits no edema and no tenderness.  Lymphadenopathy:    He has no cervical adenopathy.  Neurological: He is alert. He has normal reflexes. No cranial nerve deficit. Coordination normal.  Skin: Skin is warm and dry. No rash noted.       Onychomycotic nail changes of the toes  Psychiatric: He has a normal mood and affect. His behavior is normal.          Assessment & Plan:   Preventive health examination Hypothyroidism. TSH normal Coronary artery disease status post CABG Hypertension well controlled Dyslipidemia. Continue statin therapy   Cardiology and urology followup. Recheck here one year. Flu vaccine in the fall

## 2011-11-11 ENCOUNTER — Telehealth: Payer: Self-pay

## 2011-11-11 NOTE — Telephone Encounter (Signed)
Fax from [facility] [address] informing levoxyl on backorder from manufacturer - please change rx to synthroid or levothyrioxine Last seen 11/10/11 - CPX  Please advise

## 2011-11-12 NOTE — Telephone Encounter (Signed)
This was done 3/19

## 2011-11-12 NOTE — Telephone Encounter (Signed)
Ok  same dose    #90  RF 4

## 2011-11-18 ENCOUNTER — Ambulatory Visit (HOSPITAL_COMMUNITY)
Admission: RE | Admit: 2011-11-18 | Discharge: 2011-11-18 | Disposition: A | Discharge status: Home / Self Care | Payer: Medicare Other | Source: Ambulatory Visit | Attending: Urology | Admitting: Urology

## 2011-11-18 ENCOUNTER — Encounter (HOSPITAL_COMMUNITY): Payer: Self-pay

## 2011-11-18 ENCOUNTER — Encounter (HOSPITAL_COMMUNITY)
Admission: RE | Admit: 2011-11-18 | Discharge: 2011-11-18 | Disposition: A | Discharge status: Home / Self Care | Payer: Medicare Other | Source: Ambulatory Visit | Attending: Urology | Admitting: Urology

## 2011-11-18 DIAGNOSIS — C61 Malignant neoplasm of prostate: Secondary | ICD-10-CM

## 2011-11-18 MED ORDER — TECHNETIUM TC 99M MEDRONATE IV KIT
25.0000 | PACK | Freq: Once | INTRAVENOUS | Status: AC | PRN
  Administered 2011-11-18: 25 via INTRAVENOUS

## 2012-01-11 ENCOUNTER — Other Ambulatory Visit: Payer: Self-pay | Admitting: Internal Medicine

## 2012-05-13 ENCOUNTER — Ambulatory Visit (INDEPENDENT_AMBULATORY_CARE_PROVIDER_SITE_OTHER): Payer: Medicare Other

## 2012-05-13 DIAGNOSIS — Z23 Encounter for immunization: Secondary | ICD-10-CM

## 2012-06-13 ENCOUNTER — Ambulatory Visit: Payer: Medicare Other | Admitting: Cardiology

## 2012-06-16 ENCOUNTER — Ambulatory Visit (INDEPENDENT_AMBULATORY_CARE_PROVIDER_SITE_OTHER): Payer: Medicare Other | Admitting: Cardiology

## 2012-06-16 ENCOUNTER — Encounter: Payer: Self-pay | Admitting: Cardiology

## 2012-06-16 VITALS — BP 158/89 | HR 65 | Ht 69.0 in | Wt 147.4 lb

## 2012-06-16 DIAGNOSIS — I2510 Atherosclerotic heart disease of native coronary artery without angina pectoris: Secondary | ICD-10-CM

## 2012-06-16 DIAGNOSIS — E785 Hyperlipidemia, unspecified: Secondary | ICD-10-CM

## 2012-06-16 DIAGNOSIS — I10 Essential (primary) hypertension: Secondary | ICD-10-CM

## 2012-06-16 NOTE — Progress Notes (Signed)
HPI The patient presents for follow up of CAD.  Since I last saw him he has done well.  The patient denies any new symptoms such as chest discomfort, neck or arm discomfort. There has been no new shortness of breath, PND or orthopnea. There have been no reported palpitations, presyncope or syncope.  He does have some fatigue and weakness but this is not new.  He still walks daily.  He exercises everyday walking a mile and doing one half hour of aerobic activity. No Known Allergies  Current Outpatient Prescriptions  Medication Sig Dispense Refill  . aspirin 81 MG tablet Take 81 mg by mouth daily.        . calcium gluconate 500 MG tablet Take 500 mg by mouth daily.        . diphenhydramine-acetaminophen (TYLENOL PM) 25-500 MG TABS Take 1 tablet by mouth at bedtime as needed.        . eszopiclone (LUNESTA) 2 MG TABS Take 1 tablet (2 mg total) by mouth at bedtime as needed. Take immediately before bedtime  30 tablet  2  . fluorouracil (EFUDEX) 5 % cream Apply topically 2 (two) times daily.  40 g  0  . LEVOXYL 50 MCG tablet take 1 tablet by mouth once daily  90 tablet  3  . Multiple Vitamin (MULTIVITAMIN) tablet Take 1 tablet by mouth daily.        . Multiple Vitamins-Minerals (PRESERVISION/LUTEIN PO) Take by mouth. Daily         . nitroGLYCERIN (NITROSTAT) 0.4 MG SL tablet Place 1 tablet (0.4 mg total) under the tongue every 5 (five) minutes as needed.  90 tablet  6  . Omega-3 Fatty Acids (OMEGA-3 FISH OIL PO) Take by mouth daily.      . simvastatin (ZOCOR) 20 MG tablet Take 1 tablet (20 mg total) by mouth daily.  90 tablet  6  . cholecalciferol (VITAMIN D) 1000 UNITS tablet Take 1,000 Units by mouth daily.          Past Medical History  Diagnosis Date  . Cancer   . ACUT GASTR ULCER W/HEMORR W/O MENTION OBST 09/10/2009  . ANEMIA 10/08/2009  . CORONARY ARTERY DISEASE 01/26/2007  . HYPERLIPIDEMIA 01/26/2007  . HYPERTENSION 01/26/2007  . HYPOTHYROIDISM 10/18/2007  . MELENA 08/28/2009  . PROSTATE  CANCER, HX OF 01/26/2007  . SKIN CANCER, HX OF 01/26/2007    Past Surgical History  Procedure Date  . Coronary artery bypass graft     1991  . Hernia repair     ingunial  . Prostate surgery     prostatectomy  . Mohs surgery   . Cataract extraction     ROS:  Hard of hearing.  Otherwise as here in stated in the HPI and negative for all other systems.  PHYSICAL EXAM BP 158/89  Pulse 65  Ht 5\' 9"  (1.753 m)  Wt 147 lb 6.4 oz (66.86 kg)  BMI 21.77 kg/m2 GENERAL:  Well appearing HEENT:  Pupils equal round and reactive, fundi not visualized, oral mucosa unremarkable NECK:  No jugular venous distention, waveform within normal limits, carotid upstroke brisk and symmetric, no bruits, no thyromegaly LUNGS:  Clear to auscultation bilaterally BACK:  No CVA tenderness CHEST:  Well healed sternotomy scar. HEART:  PMI not displaced or sustained,S1 and S2 within normal limits, no S3, no S4, no clicks, no rubs, no murmurs ABD:  Flat, positive bowel sounds normal in frequency in pitch, no bruits, no rebound, no guarding, no midline  pulsatile mass, no hepatomegaly, no splenomegaly EXT:  2 plus pulses throughout, no edema, no cyanosis no clubbing   EKG:  Sinus rhythm, rate 65, axis within normal limits, intervals within normal limits, no acute ST-T wave changes. 06/16/2012   ASSESSMENT AND PLAN  CORONARY ARTERY DISEASE -  He has had no new symptoms. We discussed a reasonable interval for screening stress tests particularly given the fact that he has never had symptoms in his bypass grafts were placed in 1991. We have agreed to probable screening stress perfusion imaging next year but sooner if he has any symptoms.  HYPERTENSION -  His blood pressure is slightly elevated today but I reviewed previous readings and is usually well controlled.  HYPERLIPIDEMIA -  His LDL is 85 with an HDL of 51.9 in March. He will continue the meds as listed.

## 2012-06-16 NOTE — Patient Instructions (Addendum)
We will plan to do a routine stress test next year (2014).  Continue current medications.  Your physician wants you to follow-up in: 1 year with Dr. Antoine Poche.  You will receive a reminder letter in the mail two months in advance. If you don't receive a letter, please call our office to schedule the follow-up appointment.

## 2012-10-13 ENCOUNTER — Encounter (INDEPENDENT_AMBULATORY_CARE_PROVIDER_SITE_OTHER): Payer: Medicare Other | Admitting: Ophthalmology

## 2012-10-13 DIAGNOSIS — H43819 Vitreous degeneration, unspecified eye: Secondary | ICD-10-CM

## 2012-10-13 DIAGNOSIS — H35039 Hypertensive retinopathy, unspecified eye: Secondary | ICD-10-CM

## 2012-10-13 DIAGNOSIS — H26499 Other secondary cataract, unspecified eye: Secondary | ICD-10-CM

## 2012-10-13 DIAGNOSIS — I10 Essential (primary) hypertension: Secondary | ICD-10-CM

## 2012-10-13 DIAGNOSIS — H3530 Unspecified macular degeneration: Secondary | ICD-10-CM

## 2012-10-13 DIAGNOSIS — H353290 Exudative age-related macular degeneration, unspecified eye, stage unspecified: Secondary | ICD-10-CM

## 2012-10-14 ENCOUNTER — Encounter (INDEPENDENT_AMBULATORY_CARE_PROVIDER_SITE_OTHER): Payer: Medicare Other | Admitting: Ophthalmology

## 2012-11-08 ENCOUNTER — Other Ambulatory Visit: Payer: Medicare Other

## 2012-11-09 ENCOUNTER — Other Ambulatory Visit (INDEPENDENT_AMBULATORY_CARE_PROVIDER_SITE_OTHER): Payer: Medicare Other

## 2012-11-09 DIAGNOSIS — E785 Hyperlipidemia, unspecified: Secondary | ICD-10-CM

## 2012-11-09 DIAGNOSIS — I10 Essential (primary) hypertension: Secondary | ICD-10-CM

## 2012-11-09 DIAGNOSIS — E039 Hypothyroidism, unspecified: Secondary | ICD-10-CM

## 2012-11-09 DIAGNOSIS — Z0000 Encounter for general adult medical examination without abnormal findings: Secondary | ICD-10-CM

## 2012-11-09 DIAGNOSIS — Z8546 Personal history of malignant neoplasm of prostate: Secondary | ICD-10-CM

## 2012-11-09 DIAGNOSIS — I2510 Atherosclerotic heart disease of native coronary artery without angina pectoris: Secondary | ICD-10-CM

## 2012-11-09 LAB — HEPATIC FUNCTION PANEL
Albumin: 3.6 g/dL (ref 3.5–5.2)
Total Protein: 6 g/dL (ref 6.0–8.3)

## 2012-11-09 LAB — TSH: TSH: 3.17 u[IU]/mL (ref 0.35–5.50)

## 2012-11-09 LAB — POCT URINALYSIS DIPSTICK
Glucose, UA: NEGATIVE
Nitrite, UA: NEGATIVE
Urobilinogen, UA: 0.2

## 2012-11-09 LAB — CBC WITH DIFFERENTIAL/PLATELET
Basophils Relative: 0.5 % (ref 0.0–3.0)
Eosinophils Relative: 2.3 % (ref 0.0–5.0)
Lymphocytes Relative: 16 % (ref 12.0–46.0)
Monocytes Relative: 11.3 % (ref 3.0–12.0)
Neutrophils Relative %: 69.9 % (ref 43.0–77.0)
Platelets: 190 10*3/uL (ref 150.0–400.0)
RBC: 4.7 Mil/uL (ref 4.22–5.81)
WBC: 5.1 10*3/uL (ref 4.5–10.5)

## 2012-11-09 LAB — LIPID PANEL
Cholesterol: 145 mg/dL (ref 0–200)
HDL: 46.8 mg/dL (ref >39.00)
Triglycerides: 92 mg/dL (ref 0.0–149.0)
VLDL: 18.4 mg/dL (ref 0.0–40.0)

## 2012-11-09 LAB — BASIC METABOLIC PANEL
Calcium: 9.1 mg/dL (ref 8.4–10.5)
GFR: 68.14 mL/min (ref >60.00)
Potassium: 4.2 mEq/L (ref 3.5–5.1)
Sodium: 140 mEq/L (ref 135–145)

## 2012-11-09 LAB — PSA: PSA: 22.65 ng/mL — ABNORMAL HIGH (ref 0.10–4.00)

## 2012-11-10 ENCOUNTER — Encounter (INDEPENDENT_AMBULATORY_CARE_PROVIDER_SITE_OTHER): Payer: Medicare Other | Admitting: Ophthalmology

## 2012-11-10 DIAGNOSIS — H353290 Exudative age-related macular degeneration, unspecified eye, stage unspecified: Secondary | ICD-10-CM

## 2012-11-10 DIAGNOSIS — H35039 Hypertensive retinopathy, unspecified eye: Secondary | ICD-10-CM

## 2012-11-10 DIAGNOSIS — H3530 Unspecified macular degeneration: Secondary | ICD-10-CM

## 2012-11-10 DIAGNOSIS — I10 Essential (primary) hypertension: Secondary | ICD-10-CM

## 2012-11-15 ENCOUNTER — Ambulatory Visit (INDEPENDENT_AMBULATORY_CARE_PROVIDER_SITE_OTHER): Payer: Medicare Other | Admitting: Internal Medicine

## 2012-11-15 ENCOUNTER — Encounter: Payer: Self-pay | Admitting: Internal Medicine

## 2012-11-15 VITALS — BP 140/70 | HR 68 | Temp 97.8°F | Resp 18 | Ht 66.5 in | Wt 148.0 lb

## 2012-11-15 DIAGNOSIS — I2510 Atherosclerotic heart disease of native coronary artery without angina pectoris: Secondary | ICD-10-CM

## 2012-11-15 DIAGNOSIS — I10 Essential (primary) hypertension: Secondary | ICD-10-CM

## 2012-11-15 DIAGNOSIS — D649 Anemia, unspecified: Secondary | ICD-10-CM

## 2012-11-15 DIAGNOSIS — E785 Hyperlipidemia, unspecified: Secondary | ICD-10-CM

## 2012-11-15 DIAGNOSIS — Z8546 Personal history of malignant neoplasm of prostate: Secondary | ICD-10-CM

## 2012-11-15 DIAGNOSIS — Z0000 Encounter for general adult medical examination without abnormal findings: Secondary | ICD-10-CM

## 2012-11-15 DIAGNOSIS — E039 Hypothyroidism, unspecified: Secondary | ICD-10-CM

## 2012-11-15 MED ORDER — LEVOTHYROXINE SODIUM 50 MCG PO TABS: 50.0000 ug | ORAL_TABLET | Freq: Every day | ORAL | Status: DC

## 2012-11-15 MED ORDER — SIMVASTATIN 20 MG PO TABS: 20.0000 mg | ORAL_TABLET | Freq: Every day | ORAL | Status: DC

## 2012-11-15 MED ORDER — ESZOPICLONE 2 MG PO TABS: 2.0000 mg | ORAL_TABLET | Freq: Every evening | ORAL | Status: DC | PRN

## 2012-11-15 MED ORDER — NITROGLYCERIN 0.4 MG SL SUBL: 0.4000 mg | SUBLINGUAL_TABLET | SUBLINGUAL | Status: DC | PRN

## 2012-11-15 MED ORDER — FLUOROURACIL 5 % EX CREA: TOPICAL_CREAM | Freq: Two times a day (BID) | CUTANEOUS | Status: DC

## 2012-11-15 NOTE — Patient Instructions (Signed)
It is important that you exercise regularly, at least 20 minutes 3 to 4 times per week.  If you develop chest pain or shortness of breath seek  medical attention.  Limit your sodium (Salt) intake  Flu vaccine in the fall  Return in one year for follow-up

## 2012-11-15 NOTE — Progress Notes (Signed)
Patient ID: Raymond Swanson, male   DOB: 04-06-1925, 79 y.o.   MRN: [id number]  Subjective:    Patient ID: Raymond Swanson, male    DOB: [birth year], 79 y.o.   MRN: [id number]  HPI  79 year-old patient who is seen today for a preventive health examination. He has a history of coronary artery disease status post CABG in 1991. He is followed by annually by cardiology. He continues to do well and walks 1 mile most days. He denies any exertional chest pain;  he does describe periods of some weakness and poor exercise capacity. In general he does quite well. He is being considered for a followup stress test in the fall. He is status post prostatectomy and is followed by urology with a history of increasing PSAs. He is scheduled for urology followup soon and a bone scan is planned. He denies any bone pain More recently he has been followed by ophthalmology do to macular degeneration. He has had a nice response with treatment involving his left  Alcohol-Tobacco  Smoking Status: never   Allergies (verified):  No Known Drug Allergies   Past History:  Past Medical History:  Reviewed history from 09/10/2009 and no changes required.  Prostate cancer, hx of- history of increasing PSAs  Skin cancer, hx of Mohs L ear  Coronary artery disease status post CABG (LIMA to  the LAD, SVG to the right coronary artery, and SVG to obtuse marginal in  1991)  Hyperlipidemia  Hypertension  Hypothyroidism  History of diplopia 2009  history of gastric ulcer, secondary to aspirin, January 2011  Macular degeneration  Past Surgical History:  Coronary artery bypass graft 91  Inguinal herniorrhaphy 94  Prostatectomy 1990  MOHS left ear 8/07, left cheek [facility] March 2010  Cataract extraction  Sigmoidoscopy 1999  Cardiolite stress test September 2007, October 2010  upper panendoscopy January 2011   Family History:  Reviewed history from 10/18/2007 and no changes required.  father died age 55, pancreatic cancer  mother  died age 21  One brother died age 48 lung cancer  4 sisters   Social History:  Reviewed history from 10/23/2008 and no changes required.  Retired  Married  remote tobacco useSmoking Status: never   1. Risk factors, based on past  M,S,F history-  patient has known coronary artery disease. Cardiovascular risk factors include hypertension dyslipidemia remote tobacco use  2.  Physical activities: He remains quite active for 79 years of age. He does walk 1 mile most days of the week. He performs calisthenics and also does some balance-type exercises.  3.  Depression/mood: No history of depression or mood disorder  4.  Hearing: Wears hearing aids bilaterally 5.  ADL's: Independent in all aspects of daily living  6.  Fall risk: Mild to moderate do to  age  79.  Home safety: No problems identified  8.  Height weight, and visual acuity; height and weight stable no change in visual acuity. Wears glasses  9.  Counseling: Heart healthy diet regular exercise encouraged  10. Lab orders based on risk factors: Laboratory profile reviewed unremarkable except for a PSA of 20  11. Referral : Followup urology and cardiology  12. Care plan: Consider follow stress test in the fall. Bone scan scheduled for the near future  13. Cognitive assessment: Alert and oriented normal affect. No cognitive dysfunction  Past Medical History  Diagnosis Date  . Cancer   . ACUT GASTR ULCER W/HEMORR W/O MENTION OBST 09/10/2009  . ANEMIA  10/08/2009  . CORONARY ARTERY DISEASE 01/26/2007  . HYPERLIPIDEMIA 01/26/2007  . HYPERTENSION 01/26/2007  . HYPOTHYROIDISM 10/18/2007  . MELENA 08/28/2009  . PROSTATE CANCER, HX OF 01/26/2007  . SKIN CANCER, HX OF 01/26/2007    History   Social History  . Marital Status: Married    Spouse Name: N/A    Number of Children: N/A  . Years of Education: N/A   Occupational History  . Not on file.   Social History Main Topics  . Smoking status: Former Smoker -- 1.00 packs/day for 20  years    Types: Cigarettes    Quit date: 10/28/1948  . Smokeless tobacco: Never Used  . Alcohol Use: Yes  . Drug Use: No  . Sexually Active: Not on file   Other Topics Concern  . Not on file   Social History Narrative  . No narrative on file    Past Surgical History  Procedure Laterality Date  . Coronary artery bypass graft      1991  . Hernia repair      ingunial  . Prostate surgery      prostatectomy  . Mohs surgery    . Cataract extraction      Family History  Problem Relation Age of Onset  . Lung cancer Brother 106  . GI problems Sister     No Known Allergies  Current Outpatient Prescriptions on File Prior to Visit  Medication Sig Dispense Refill  . aspirin 81 MG tablet Take 81 mg by mouth daily.        . calcium gluconate 500 MG tablet Take 500 mg by mouth daily.        . cholecalciferol (VITAMIN D) 1000 UNITS tablet Take 1,000 Units by mouth daily.        . diphenhydramine-acetaminophen (TYLENOL PM) 25-500 MG TABS Take 1 tablet by mouth at bedtime as needed.        . Multiple Vitamin (MULTIVITAMIN) tablet Take 1 tablet by mouth daily.        . Multiple Vitamins-Minerals (PRESERVISION/LUTEIN PO) Take by mouth. Daily         . Omega-3 Fatty Acids (OMEGA-3 FISH OIL PO) Take by mouth daily.       No current facility-administered medications on file prior to visit.    BP 140/70  Pulse 68  Temp(Src) 97.8 F (36.6 C) (Oral)  Resp 18  Ht 5' 6.5" (1.689 m)  Wt 148 lb (67.132 kg)  BMI 23.53 kg/m2  SpO2 95%        Review of Systems  Constitutional: Negative for fever, chills, activity change, appetite change and fatigue.  HENT: Negative for hearing loss, ear pain, congestion, rhinorrhea, sneezing, mouth sores, trouble swallowing, neck pain, neck stiffness, dental problem, voice change, sinus pressure and tinnitus.   Eyes: Negative for photophobia, pain, redness and visual disturbance.  Respiratory: Negative for apnea, cough, choking, chest tightness,  shortness of breath and wheezing.   Cardiovascular: Negative for chest pain, palpitations and leg swelling.  Gastrointestinal: Negative for nausea, vomiting, abdominal pain, diarrhea, constipation, blood in stool, abdominal distention, anal bleeding and rectal pain.  Genitourinary: Negative for dysuria, urgency, frequency, hematuria, flank pain, decreased urine volume, discharge, penile swelling, scrotal swelling, difficulty urinating, genital sores and testicular pain.  Musculoskeletal: Negative for myalgias, back pain, joint swelling, arthralgias and gait problem.  Skin: Negative for color change, rash and wound.  Neurological: Positive for weakness. Negative for dizziness, tremors, seizures, syncope, facial asymmetry, speech difficulty, light-headedness, numbness and  headaches.  Hematological: Negative for adenopathy. Does not bruise/bleed easily.  Psychiatric/Behavioral: Negative for suicidal ideas, hallucinations, behavioral problems, confusion, sleep disturbance, self-injury, dysphoric mood, decreased concentration and agitation. The patient is not nervous/anxious.        Objective:   Physical Exam  Constitutional: He appears well-developed and well-nourished.  HENT:  Head: Normocephalic and atraumatic.  Right Ear: External ear normal.  Left Ear: External ear normal.  Nose: Nose normal.  Mouth/Throat: Oropharynx is clear and moist.  Hearing aids bilaterally  Eyes: Conjunctivae and EOM are normal. Pupils are equal, round, and reactive to light. No scleral icterus.  Neck: Normal range of motion. Neck supple. No JVD present. No thyromegaly present.  Decreased left carotid upstroke but no bruit   Cardiovascular: Normal rate, normal heart sounds and intact distal pulses.  Exam reveals no gallop and no friction rub.   No murmur heard. Frequent ectopics  Pulmonary/Chest: Effort normal and breath sounds normal. He exhibits no tenderness.  Abdominal: Soft. Bowel sounds are normal. He  exhibits no distension and no mass. There is no tenderness.  Genitourinary: Penis normal.  Musculoskeletal: Normal range of motion. He exhibits no edema and no tenderness.  Lymphadenopathy:    He has no cervical adenopathy.  Neurological: He is alert. He has normal reflexes. No cranial nerve deficit. Coordination normal.  Skin: Skin is warm and dry. No rash noted.  Onychomycotic nail changes of the toes  Psychiatric: He has a normal mood and affect. His behavior is normal.          Assessment & Plan:   Preventive health examination Hypothyroidism. TSH normal Coronary artery disease status post CABG Hypertension well controlled Dyslipidemia. Continue statin therapy Prostate cancer. Followup urology   Cardiology and urology followup. Recheck here one year. Flu vaccine in the fall

## 2012-12-22 ENCOUNTER — Encounter (INDEPENDENT_AMBULATORY_CARE_PROVIDER_SITE_OTHER): Payer: Medicare Other | Admitting: Ophthalmology

## 2012-12-22 DIAGNOSIS — I10 Essential (primary) hypertension: Secondary | ICD-10-CM

## 2012-12-22 DIAGNOSIS — H353290 Exudative age-related macular degeneration, unspecified eye, stage unspecified: Secondary | ICD-10-CM

## 2012-12-22 DIAGNOSIS — H26499 Other secondary cataract, unspecified eye: Secondary | ICD-10-CM

## 2012-12-22 DIAGNOSIS — H3530 Unspecified macular degeneration: Secondary | ICD-10-CM

## 2012-12-22 DIAGNOSIS — H43819 Vitreous degeneration, unspecified eye: Secondary | ICD-10-CM

## 2012-12-22 DIAGNOSIS — H35039 Hypertensive retinopathy, unspecified eye: Secondary | ICD-10-CM

## 2013-02-01 ENCOUNTER — Encounter (INDEPENDENT_AMBULATORY_CARE_PROVIDER_SITE_OTHER): Payer: Medicare Other | Admitting: Ophthalmology

## 2013-02-01 DIAGNOSIS — I10 Essential (primary) hypertension: Secondary | ICD-10-CM

## 2013-02-01 DIAGNOSIS — H35039 Hypertensive retinopathy, unspecified eye: Secondary | ICD-10-CM

## 2013-02-01 DIAGNOSIS — H43819 Vitreous degeneration, unspecified eye: Secondary | ICD-10-CM

## 2013-02-01 DIAGNOSIS — H3530 Unspecified macular degeneration: Secondary | ICD-10-CM

## 2013-02-01 DIAGNOSIS — H353290 Exudative age-related macular degeneration, unspecified eye, stage unspecified: Secondary | ICD-10-CM

## 2013-03-15 ENCOUNTER — Encounter (INDEPENDENT_AMBULATORY_CARE_PROVIDER_SITE_OTHER): Payer: Medicare Other | Admitting: Ophthalmology

## 2013-03-15 DIAGNOSIS — H35039 Hypertensive retinopathy, unspecified eye: Secondary | ICD-10-CM

## 2013-03-15 DIAGNOSIS — H353290 Exudative age-related macular degeneration, unspecified eye, stage unspecified: Secondary | ICD-10-CM

## 2013-03-15 DIAGNOSIS — H3530 Unspecified macular degeneration: Secondary | ICD-10-CM

## 2013-03-15 DIAGNOSIS — I10 Essential (primary) hypertension: Secondary | ICD-10-CM

## 2013-04-26 ENCOUNTER — Encounter (INDEPENDENT_AMBULATORY_CARE_PROVIDER_SITE_OTHER): Payer: Medicare Other | Admitting: Ophthalmology

## 2013-04-26 DIAGNOSIS — I10 Essential (primary) hypertension: Secondary | ICD-10-CM

## 2013-04-26 DIAGNOSIS — H43819 Vitreous degeneration, unspecified eye: Secondary | ICD-10-CM

## 2013-04-26 DIAGNOSIS — H35039 Hypertensive retinopathy, unspecified eye: Secondary | ICD-10-CM

## 2013-04-26 DIAGNOSIS — H3530 Unspecified macular degeneration: Secondary | ICD-10-CM

## 2013-04-26 DIAGNOSIS — H353290 Exudative age-related macular degeneration, unspecified eye, stage unspecified: Secondary | ICD-10-CM

## 2013-05-09 ENCOUNTER — Ambulatory Visit (INDEPENDENT_AMBULATORY_CARE_PROVIDER_SITE_OTHER): Payer: Medicare Other

## 2013-05-09 DIAGNOSIS — Z23 Encounter for immunization: Secondary | ICD-10-CM

## 2013-06-21 ENCOUNTER — Encounter (INDEPENDENT_AMBULATORY_CARE_PROVIDER_SITE_OTHER): Payer: Medicare Other | Admitting: Ophthalmology

## 2013-06-21 DIAGNOSIS — H353290 Exudative age-related macular degeneration, unspecified eye, stage unspecified: Secondary | ICD-10-CM

## 2013-06-21 DIAGNOSIS — H35039 Hypertensive retinopathy, unspecified eye: Secondary | ICD-10-CM

## 2013-06-21 DIAGNOSIS — H43819 Vitreous degeneration, unspecified eye: Secondary | ICD-10-CM

## 2013-06-21 DIAGNOSIS — I10 Essential (primary) hypertension: Secondary | ICD-10-CM

## 2013-06-21 DIAGNOSIS — H3530 Unspecified macular degeneration: Secondary | ICD-10-CM

## 2013-06-27 ENCOUNTER — Encounter: Payer: Self-pay | Admitting: Cardiology

## 2013-06-27 ENCOUNTER — Ambulatory Visit (INDEPENDENT_AMBULATORY_CARE_PROVIDER_SITE_OTHER): Payer: Medicare Other | Admitting: Cardiology

## 2013-06-27 VITALS — BP 154/75 | HR 65 | Ht 68.0 in | Wt 145.0 lb

## 2013-06-27 DIAGNOSIS — I2510 Atherosclerotic heart disease of native coronary artery without angina pectoris: Secondary | ICD-10-CM

## 2013-06-27 NOTE — Progress Notes (Signed)
HPI The patient presents for follow up of CAD.  Since I last saw him he has done well.  The patient denies any new symptoms such as chest discomfort, neck or arm discomfort. There has been no new shortness of breath, PND or orthopnea. There have been no reported palpitations, presyncope or syncope.  H He still walks daily.  He exercises everyday walking a mile and doing one half hour of aerobic activity.  He also is active working in his yard.  No Known Allergies  Current Outpatient Prescriptions  Medication Sig Dispense Refill  . aspirin 81 MG tablet Take 81 mg by mouth daily.        . calcium gluconate 500 MG tablet Take 500 mg by mouth daily.        . cholecalciferol (VITAMIN D) 1000 UNITS tablet Take 1,000 Units by mouth daily.        . diphenhydramine-acetaminophen (TYLENOL PM) 25-500 MG TABS Take 1 tablet by mouth at bedtime as needed.        . eszopiclone (LUNESTA) 2 MG TABS Take 1 tablet (2 mg total) by mouth at bedtime as needed. Take immediately before bedtime  30 tablet  2  . fluorouracil (EFUDEX) 5 % cream Apply topically 2 (two) times daily.  40 g  2  . levothyroxine (LEVOXYL) 50 MCG tablet Take 1 tablet (50 mcg total) by mouth daily.  90 tablet  3  . Multiple Vitamin (MULTIVITAMIN) tablet Take 1 tablet by mouth daily.        . Multiple Vitamins-Minerals (PRESERVISION/LUTEIN PO) Take by mouth. Daily         . nitroGLYCERIN (NITROSTAT) 0.4 MG SL tablet Place 1 tablet (0.4 mg total) under the tongue every 5 (five) minutes as needed.  90 tablet  3  . Omega-3 Fatty Acids (OMEGA-3 FISH OIL PO) Take by mouth daily.      . simvastatin (ZOCOR) 20 MG tablet Take 1 tablet (20 mg total) by mouth daily.  90 tablet  3   No current facility-administered medications for this visit.    Past Medical History  Diagnosis Date  . Cancer   . ACUT GASTR ULCER W/HEMORR W/O MENTION OBST 09/10/2009  . ANEMIA 10/08/2009  . CORONARY ARTERY DISEASE 01/26/2007  . HYPERLIPIDEMIA 01/26/2007  . HYPERTENSION  01/26/2007  . HYPOTHYROIDISM 10/18/2007  . MELENA 08/28/2009  . PROSTATE CANCER, HX OF 01/26/2007  . SKIN CANCER, HX OF 01/26/2007    Past Surgical History  Procedure Laterality Date  . Coronary artery bypass graft      1991  . Hernia repair      ingunial  . Prostate surgery      prostatectomy  . Mohs surgery    . Cataract extraction      ROS:  Hard of hearing.  Otherwise as here in stated in the HPI and negative for all other systems.  PHYSICAL EXAM BP 154/75  Pulse 65  Ht 5\' 8"  (1.727 m)  Wt 145 lb (65.772 kg)  BMI 22.05 kg/m2 GENERAL:  Well appearing HEENT:  Pupils equal round and reactive, fundi not visualized, oral mucosa unremarkable NECK:  No jugular venous distention, waveform within normal limits, carotid upstroke brisk and symmetric, no bruits, no thyromegaly LUNGS:  Clear to auscultation bilaterally BACK:  No CVA tenderness CHEST:  Well healed sternotomy scar. HEART:  PMI not displaced or sustained,S1 and S2 within normal limits, no S3, no S4, no clicks, no rubs, no murmurs ABD:  Flat, positive bowel sounds  normal in frequency in pitch, no bruits, no rebound, no guarding, no midline pulsatile mass, no hepatomegaly, no splenomegaly EXT:  2 plus pulses throughout, no edema, no cyanosis no clubbing   EKG:  Sinus rhythm, rate 65, axis within normal limits, intervals within normal limits, no acute ST-T wave changes. 06/27/2013   ASSESSMENT AND PLAN  CORONARY ARTERY DISEASE -  He has had no new symptoms. However, since he never had chest pain prior to his surgery and I have talked about screening exercise treadmill testing. I will bring him back for this.  HYPERTENSION -  His blood pressure is slightly elevated today but I reviewed previous readings and is usually well controlled. He has been controlled recently at other doctor's offices and at home or in  HYPERLIPIDEMIA -  His LDL is 80 with an HDL of 46.8 earlier this year. He will continue the meds as listed.

## 2013-06-27 NOTE — Patient Instructions (Signed)
The current medical regimen is effective;  continue present plan and medications.  Your physician has requested that you have an exercise tolerance test. For further information please visit [email or web address]. Please also follow instruction sheet, as given.   

## 2013-08-03 ENCOUNTER — Encounter: Payer: Self-pay | Admitting: Family

## 2013-08-03 ENCOUNTER — Ambulatory Visit (INDEPENDENT_AMBULATORY_CARE_PROVIDER_SITE_OTHER): Payer: Medicare Other | Admitting: Family

## 2013-08-03 VITALS — BP 138/78 | HR 88 | Wt 148.0 lb

## 2013-08-03 DIAGNOSIS — M479 Spondylosis, unspecified: Secondary | ICD-10-CM

## 2013-08-03 DIAGNOSIS — M47815 Spondylosis without myelopathy or radiculopathy, thoracolumbar region: Secondary | ICD-10-CM

## 2013-08-03 DIAGNOSIS — M545 Low back pain, unspecified: Secondary | ICD-10-CM

## 2013-08-03 LAB — POCT URINALYSIS DIPSTICK
Bilirubin, UA: NEGATIVE
Leukocytes, UA: NEGATIVE
Nitrite, UA: NEGATIVE
Urobilinogen, UA: 0.2
pH, UA: 5

## 2013-08-03 MED ORDER — METHYLPREDNISOLONE 4 MG PO KIT: PACK | ORAL | Status: AC

## 2013-08-03 NOTE — Patient Instructions (Signed)
Back Exercises Back exercises help treat and prevent back injuries. The goal of back exercises is to increase the strength of your abdominal and back muscles and the flexibility of your back. These exercises should be started when you no longer have back pain. Back exercises include:  Pelvic Tilt. Lie on your back with your knees bent. Tilt your pelvis until the lower part of your back is against the floor. Hold this position 5 to 10 sec and repeat 5 to 10 times.  Knee to Chest. Pull first 1 knee up against your chest and hold for 20 to 30 seconds, repeat this with the other knee, and then both knees. This may be done with the other leg straight or bent, whichever feels better.  Sit-Ups or Curl-Ups. Bend your knees 90 degrees. Start with tilting your pelvis, and do a partial, slow sit-up, lifting your trunk only 30 to 45 degrees off the floor. Take at least 2 to 3 seconds for each sit-up. Do not do sit-ups with your knees out straight. If partial sit-ups are difficult, simply do the above but with only tightening your abdominal muscles and holding it as directed.  Hip-Lift. Lie on your back with your knees flexed 90 degrees. Push down with your feet and shoulders as you raise your hips a couple inches off the floor; hold for 10 seconds, repeat 5 to 10 times.  Back arches. Lie on your stomach, propping yourself up on bent elbows. Slowly press on your hands, causing an arch in your low back. Repeat 3 to 5 times. Any initial stiffness and discomfort should lessen with repetition over time.  Shoulder-Lifts. Lie face down with arms beside your body. Keep hips and torso pressed to floor as you slowly lift your head and shoulders off the floor. Do not overdo your exercises, especially in the beginning. Exercises may cause you some mild back discomfort which lasts for a few minutes; however, if the pain is more severe, or lasts for more than 15 minutes, do not continue exercises until you see your caregiver.  Improvement with exercise therapy for back problems is slow.  See your caregivers for assistance with developing a proper back exercise program. Document Released: 09/17/2004 Document Revised: 11/02/2011 Document Reviewed: 06/11/2011 [employer] Patient Information 2014 [employer].  

## 2013-08-03 NOTE — Progress Notes (Signed)
Subjective:    Patient ID: Raymond Swanson, male    DOB: [birth year], 79 y.o.   MRN: [id number]  HPI 79 year old white male, nonsmoker patient of Dr. Kirtland Bouchard. with a history of prostate malignancy and osteoarthritis is in today with complaints of low back pain x10 days. Patient reports his symptoms began after picking up a cinder block. He rates the pain 8/10, typically worse at night and with movement. He's been taking Tylenol that helps his pain. Patient has not had up-to-date x-ray but reports a bone scan. Pain does not radiate.   Review of Systems  Constitutional: Negative.   HENT: Negative.   Respiratory: Negative.   Cardiovascular: Negative.   Gastrointestinal: Negative.   Musculoskeletal: Positive for back pain. Negative for neck pain and neck stiffness.  Skin: Negative.   Allergic/Immunologic: Negative.   Hematological: Negative.   Psychiatric/Behavioral: Negative.    Past Medical History  Diagnosis Date  . Cancer   . ACUT GASTR ULCER W/HEMORR W/O MENTION OBST 09/10/2009  . ANEMIA 10/08/2009  . CORONARY ARTERY DISEASE 01/26/2007  . HYPERLIPIDEMIA 01/26/2007  . HYPERTENSION 01/26/2007  . HYPOTHYROIDISM 10/18/2007  . MELENA 08/28/2009  . PROSTATE CANCER, HX OF 01/26/2007  . SKIN CANCER, HX OF 01/26/2007    History   Social History  . Marital Status: Married    Spouse Name: N/A    Number of Children: N/A  . Years of Education: N/A   Occupational History  . Not on file.   Social History Main Topics  . Smoking status: Former Smoker -- 1.00 packs/day for 20 years    Types: Cigarettes    Quit date: 10/28/1948  . Smokeless tobacco: Never Used  . Alcohol Use: Yes  . Drug Use: No  . Sexual Activity: Not on file   Other Topics Concern  . Not on file   Social History Narrative  . No narrative on file    Past Surgical History  Procedure Laterality Date  . Coronary artery bypass graft      1991  . Hernia repair      ingunial  . Prostate surgery      prostatectomy  . Mohs  surgery    . Cataract extraction      Family History  Problem Relation Age of Onset  . Lung cancer Brother 43  . GI problems Sister     No Known Allergies  Current Outpatient Prescriptions on File Prior to Visit  Medication Sig Dispense Refill  . aspirin 81 MG tablet Take 81 mg by mouth daily.        . calcium gluconate 500 MG tablet Take 500 mg by mouth daily.        . cholecalciferol (VITAMIN D) 1000 UNITS tablet Take 1,000 Units by mouth daily.        . diphenhydramine-acetaminophen (TYLENOL PM) 25-500 MG TABS Take 1 tablet by mouth at bedtime as needed.        . eszopiclone (LUNESTA) 2 MG TABS Take 1 tablet (2 mg total) by mouth at bedtime as needed. Take immediately before bedtime  30 tablet  2  . fluorouracil (EFUDEX) 5 % cream Apply topically 2 (two) times daily.  40 g  2  . levothyroxine (LEVOXYL) 50 MCG tablet Take 1 tablet (50 mcg total) by mouth daily.  90 tablet  3  . Multiple Vitamin (MULTIVITAMIN) tablet Take 1 tablet by mouth daily.        . Multiple Vitamins-Minerals (PRESERVISION/LUTEIN PO) Take by mouth. Daily         .  nitroGLYCERIN (NITROSTAT) 0.4 MG SL tablet Place 1 tablet (0.4 mg total) under the tongue every 5 (five) minutes as needed.  90 tablet  3  . Omega-3 Fatty Acids (OMEGA-3 FISH OIL PO) Take by mouth daily.      . simvastatin (ZOCOR) 20 MG tablet Take 1 tablet (20 mg total) by mouth daily.  90 tablet  3   No current facility-administered medications on file prior to visit.    BP 138/78  Pulse 88  Wt 148 lb (67.132 kg)chart    Objective:   Physical Exam  Constitutional: He is oriented to person, place, and time. He appears well-developed and well-nourished.  Neck: Normal range of motion. Neck supple.  Cardiovascular: Normal rate, regular rhythm and normal heart sounds.   Pulmonary/Chest: Effort normal and breath sounds normal.  Musculoskeletal: He exhibits tenderness.  Tenderness to palpation of the lumbar spine. Pain with rotation at the hip  and with flexion at the hip. Negative straight leg raise. No swelling. Pedal pulses 2 out of 2.  Neurological: He is alert and oriented to person, place, and time.  Skin: Skin is warm and dry.  Psychiatric: He has a normal mood and affect.          Assessment & Plan:  Assessment: 1. Low back pain 2. History of prostate cancer 3. Osteoarthritis Lumbar spine  Plan: Medrol Dosepak as directed. If symptoms persist will consider a scan of the lower back. Patient advised to let us know if pain persists. Recheck as scheduled and as needed sooner.

## 2013-08-08 ENCOUNTER — Encounter: Payer: Medicare Other | Admitting: Nurse Practitioner

## 2013-08-30 ENCOUNTER — Encounter (INDEPENDENT_AMBULATORY_CARE_PROVIDER_SITE_OTHER): Payer: Medicare Other | Admitting: Ophthalmology

## 2013-08-30 DIAGNOSIS — H43819 Vitreous degeneration, unspecified eye: Secondary | ICD-10-CM

## 2013-08-30 DIAGNOSIS — H35039 Hypertensive retinopathy, unspecified eye: Secondary | ICD-10-CM

## 2013-08-30 DIAGNOSIS — H3530 Unspecified macular degeneration: Secondary | ICD-10-CM

## 2013-08-30 DIAGNOSIS — H353290 Exudative age-related macular degeneration, unspecified eye, stage unspecified: Secondary | ICD-10-CM

## 2013-08-30 DIAGNOSIS — I10 Essential (primary) hypertension: Secondary | ICD-10-CM

## 2013-08-31 ENCOUNTER — Ambulatory Visit (INDEPENDENT_AMBULATORY_CARE_PROVIDER_SITE_OTHER): Payer: Medicare Other | Admitting: Internal Medicine

## 2013-08-31 ENCOUNTER — Encounter: Payer: Self-pay | Admitting: Internal Medicine

## 2013-08-31 VITALS — BP 150/70 | HR 89 | Temp 97.6°F | Resp 20 | Wt 148.0 lb

## 2013-08-31 DIAGNOSIS — I10 Essential (primary) hypertension: Secondary | ICD-10-CM

## 2013-08-31 DIAGNOSIS — M549 Dorsalgia, unspecified: Secondary | ICD-10-CM

## 2013-08-31 MED ORDER — TRAMADOL HCL 50 MG PO TABS: 50.0000 mg | ORAL_TABLET | Freq: Three times a day (TID) | ORAL | Status: DC | PRN

## 2013-08-31 NOTE — Progress Notes (Signed)
Pre-visit discussion using our clinic review tool. No additional management support is needed unless otherwise documented below in the visit note.  

## 2013-08-31 NOTE — Patient Instructions (Signed)
Most patients with low back pain will improve with time over the next two to 6 weeks.  Keep active but avoid any activities that cause pain.  Apply moist heat to the low back area several times daily.Back Injury Prevention Back injuries can be extremely painful and difficult to heal. After having one back injury, you are much more likely to experience another later on. It is important to learn how to avoid injuring or re-injuring your back. The following tips can help you to prevent a back injury. PHYSICAL FITNESS  Exercise regularly and try to develop good tone in your abdominal muscles. Your abdominal muscles provide a lot of the support needed by your back.  Do aerobic exercises (walking, jogging, biking, swimming) regularly.  Do exercises that increase balance and strength (tai chi, yoga) regularly. This can decrease your risk of falling and injuring your back.  Stretch before and after exercising.  Maintain a healthy weight. The more you weigh, the more stress is placed on your back. For every pound of weight, 10 times that amount of pressure is placed on the back. DIET  Talk to your caregiver about how much calcium and vitamin D you need per day. These nutrients help to prevent weakening of the bones (osteoporosis). Osteoporosis can cause broken (fractured) bones that lead to back pain.  Include good sources of calcium in your diet, such as dairy products, green, leafy vegetables, and products with calcium added (fortified).  Include good sources of vitamin D in your diet, such as milk and foods that are fortified with vitamin D.  Consider taking a nutritional supplement or a multivitamin if needed.  Stop smoking if you smoke. POSTURE  Sit and stand up straight. Avoid leaning forward when you sit or hunching over when you stand.  Choose chairs with good low back (lumbar) support.  If you work at a desk, sit close to your work so you do not need to lean over. Keep your chin tucked  in. Keep your neck drawn back and elbows bent at a right angle. Your arms should look like the letter "L."  Sit high and close to the steering wheel when you drive. Add a lumbar support to your car seat if needed.  Avoid sitting or standing in one position for too long. Take breaks to get up, stretch, and walk around at least once every hour. Take breaks if you are driving for long periods of time.  Sleep on your side with your knees slightly bent, or sleep on your back with a pillow under your knees. Do not sleep on your stomach. LIFTING, TWISTING, AND REACHING  Avoid heavy lifting, especially repetitive lifting. If you must do heavy lifting:  Stretch before lifting.  Work slowly.  Rest between lifts.  Use carts and dollies to move objects when possible.  Make several small trips instead of carrying 1 heavy load.  Ask for help when you need it.  Ask for help when moving big, awkward objects.  Follow these steps when lifting:  Stand with your feet shoulder-width apart.  Get as close to the object as you can. Do not try to pick up heavy objects that are far from your body.  Use handles or lifting straps if they are available.  Bend at your knees. Squat down, but keep your heels off the floor.  Keep your shoulders pulled back, your chin tucked in, and your back straight.  Lift the object slowly, tightening the muscles in your legs, abdomen, and  buttocks. Keep the object as close to the center of your body as possible.  When you put a load down, use these same guidelines in reverse.  Do not:  Lift the object above your waist.  Twist at the waist while lifting or carrying a load. Move your feet if you need to turn, not your waist.  Bend over without bending at your knees.  Avoid reaching over your head, across a table, or for an object on a high surface. OTHER TIPS  Avoid wet floors and keep sidewalks clear of ice to prevent falls.  Do not sleep on a mattress that is  too soft or too hard.  Keep items that are used frequently within easy reach.  Put heavier objects on shelves at waist level and lighter objects on lower or higher shelves.  Find ways to decrease your stress, such as exercise, massage, or relaxation techniques. Stress can build up in your muscles. Tense muscles are more vulnerable to injury.  Seek treatment for depression or anxiety if needed. These conditions can increase your risk of developing back pain. SEEK MEDICAL CARE IF:  You injure your back.  You have questions about diet, exercise, or other ways to prevent back injuries. MAKE SURE YOU:  Understand these instructions.  Will watch your condition.  Will get help right away if you are not doing well or get worse. Document Released: 09/17/2004 Document Revised: 11/02/2011 Document Reviewed: 09/21/2011 [facility] Patient Information 2014 [place], [place]. Back Injury Prevention Back injuries can be extremely painful and difficult to heal. After having one back injury, you are much more likely to experience another later on. It is important to learn how to avoid injuring or re-injuring your back. The following tips can help you to prevent a back injury. PHYSICAL FITNESS  Exercise regularly and try to develop good tone in your abdominal muscles. Your abdominal muscles provide a lot of the support needed by your back.  Do aerobic exercises (walking, jogging, biking, swimming) regularly.  Do exercises that increase balance and strength (tai chi, yoga) regularly. This can decrease your risk of falling and injuring your back.  Stretch before and after exercising.  Maintain a healthy weight. The more you weigh, the more stress is placed on your back. For every pound of weight, 10 times that amount of pressure is placed on the back. DIET  Talk to your caregiver about how much calcium and vitamin D you need per day. These nutrients help to prevent weakening of the bones (osteoporosis).  Osteoporosis can cause broken (fractured) bones that lead to back pain.  Include good sources of calcium in your diet, such as dairy products, green, leafy vegetables, and products with calcium added (fortified).  Include good sources of vitamin D in your diet, such as milk and foods that are fortified with vitamin D.  Consider taking a nutritional supplement or a multivitamin if needed.  Stop smoking if you smoke. POSTURE  Sit and stand up straight. Avoid leaning forward when you sit or hunching over when you stand.  Choose chairs with good low back (lumbar) support.  If you work at a desk, sit close to your work so you do not need to lean over. Keep your chin tucked in. Keep your neck drawn back and elbows bent at a right angle. Your arms should look like the letter "L."  Sit high and close to the steering wheel when you drive. Add a lumbar support to your car seat if needed.  Avoid  sitting or standing in one position for too long. Take breaks to get up, stretch, and walk around at least once every hour. Take breaks if you are driving for long periods of time.  Sleep on your side with your knees slightly bent, or sleep on your back with a pillow under your knees. Do not sleep on your stomach. LIFTING, TWISTING, AND REACHING  Avoid heavy lifting, especially repetitive lifting. If you must do heavy lifting:  Stretch before lifting.  Work slowly.  Rest between lifts.  Use carts and dollies to move objects when possible.  Make several small trips instead of carrying 1 heavy load.  Ask for help when you need it.  Ask for help when moving big, awkward objects.  Follow these steps when lifting:  Stand with your feet shoulder-width apart.  Get as close to the object as you can. Do not try to pick up heavy objects that are far from your body.  Use handles or lifting straps if they are available.  Bend at your knees. Squat down, but keep your heels off the floor.  Keep your  shoulders pulled back, your chin tucked in, and your back straight.  Lift the object slowly, tightening the muscles in your legs, abdomen, and buttocks. Keep the object as close to the center of your body as possible.  When you put a load down, use these same guidelines in reverse.  Do not:  Lift the object above your waist.  Twist at the waist while lifting or carrying a load. Move your feet if you need to turn, not your waist.  Bend over without bending at your knees.  Avoid reaching over your head, across a table, or for an object on a high surface. OTHER TIPS  Avoid wet floors and keep sidewalks clear of ice to prevent falls.  Do not sleep on a mattress that is too soft or too hard.  Keep items that are used frequently within easy reach.  Put heavier objects on shelves at waist level and lighter objects on lower or higher shelves.  Find ways to decrease your stress, such as exercise, massage, or relaxation techniques. Stress can build up in your muscles. Tense muscles are more vulnerable to injury.  Seek treatment for depression or anxiety if needed. These conditions can increase your risk of developing back pain. SEEK MEDICAL CARE IF:  You injure your back.  You have questions about diet, exercise, or other ways to prevent back injuries. MAKE SURE YOU:  Understand these instructions.  Will watch your condition.  Will get help right away if you are not doing well or get worse. Document Released: 09/17/2004 Document Revised: 11/02/2011 Document Reviewed: 09/21/2011 [facility] Patient Information 2014 [place], [place].

## 2013-08-31 NOTE — Progress Notes (Signed)
Subjective:    Patient ID: Raymond Swanson, male    DOB: Nov 19, 1924, 80 y.o.   MRN: [id number]  HPI  80 year old patient who is seen today for followup of low back pain. He was seen earlier after lifting a heavy cinderblock with acute low back pain. He has been using rest warm compresses and has done quite well. He has very little pain through the night and remains fairly active. He continues to walk his dog daily without any difficulties. Pain is well-controlled with Tylenol only. He feels that he is getting better daily.  Past Medical History  Diagnosis Date  . Cancer   . ACUT GASTR ULCER W/HEMORR W/O MENTION OBST 09/10/2009  . ANEMIA 10/08/2009  . CORONARY ARTERY DISEASE 01/26/2007  . HYPERLIPIDEMIA 01/26/2007  . HYPERTENSION 01/26/2007  . HYPOTHYROIDISM 10/18/2007  . MELENA 08/28/2009  . PROSTATE CANCER, HX OF 01/26/2007  . SKIN CANCER, HX OF 01/26/2007    History   Social History  . Marital Status: Married    Spouse Name: N/A    Number of Children: N/A  . Years of Education: N/A   Occupational History  . Not on file.   Social History Main Topics  . Smoking status: Former Smoker -- 1.00 packs/day for 20 years    Types: Cigarettes    Quit date: 10/28/1948  . Smokeless tobacco: Never Used  . Alcohol Use: Yes  . Drug Use: No  . Sexual Activity: Not on file   Other Topics Concern  . Not on file   Social History Narrative  . No narrative on file    Past Surgical History  Procedure Laterality Date  . Coronary artery bypass graft      1991  . Hernia repair      ingunial  . Prostate surgery      prostatectomy  . Mohs surgery    . Cataract extraction      Family History  Problem Relation Age of Onset  . Lung cancer Brother 28  . GI problems Sister     No Known Allergies  Current Outpatient Prescriptions on File Prior to Visit  Medication Sig Dispense Refill  . aspirin 81 MG tablet Take 81 mg by mouth daily.        . calcium gluconate 500 MG tablet Take 500 mg by  mouth daily.        . cholecalciferol (VITAMIN D) 1000 UNITS tablet Take 1,000 Units by mouth daily.        . diphenhydramine-acetaminophen (TYLENOL PM) 25-500 MG TABS Take 1 tablet by mouth at bedtime as needed.        . eszopiclone (LUNESTA) 2 MG TABS Take 1 tablet (2 mg total) by mouth at bedtime as needed. Take immediately before bedtime  30 tablet  2  . fluorouracil (EFUDEX) 5 % cream Apply topically 2 (two) times daily.  40 g  2  . levothyroxine (LEVOXYL) 50 MCG tablet Take 1 tablet (50 mcg total) by mouth daily.  90 tablet  3  . Multiple Vitamin (MULTIVITAMIN) tablet Take 1 tablet by mouth daily.        . Multiple Vitamins-Minerals (PRESERVISION/LUTEIN PO) Take by mouth. Daily         . nitroGLYCERIN (NITROSTAT) 0.4 MG SL tablet Place 1 tablet (0.4 mg total) under the tongue every 5 (five) minutes as needed.  90 tablet  3  . Omega-3 Fatty Acids (OMEGA-3 FISH OIL PO) Take by mouth daily.      Marland Kitchen  simvastatin (ZOCOR) 20 MG tablet Take 1 tablet (20 mg total) by mouth daily.  90 tablet  3   No current facility-administered medications on file prior to visit.    BP 150/70  Pulse 89  Temp(Src) 97.6 F (36.4 C) (Oral)  Resp 20  Wt 148 lb (67.132 kg)  SpO2 97%       Review of Systems  Constitutional: Negative for fever, chills, appetite change and fatigue.  HENT: Negative for congestion, dental problem, ear pain, hearing loss, sore throat, tinnitus, trouble swallowing and voice change.   Eyes: Negative for pain, discharge and visual disturbance.  Respiratory: Negative for cough, chest tightness, wheezing and stridor.   Cardiovascular: Negative for chest pain, palpitations and leg swelling.  Gastrointestinal: Negative for nausea, vomiting, abdominal pain, diarrhea, constipation, blood in stool and abdominal distention.  Genitourinary: Negative for urgency, hematuria, flank pain, discharge, difficulty urinating and genital sores.  Musculoskeletal: Positive for back pain. Negative for  arthralgias, gait problem, joint swelling, myalgias and neck stiffness.  Skin: Negative for rash.  Neurological: Negative for dizziness, syncope, speech difficulty, weakness, numbness and headaches.  Hematological: Negative for adenopathy. Does not bruise/bleed easily.  Psychiatric/Behavioral: Negative for behavioral problems and dysphoric mood. The patient is not nervous/anxious.        Objective:   Physical Exam  Constitutional: He appears well-developed and well-nourished. No distress.  Musculoskeletal:  Negative straight leg test Normal hip flexion Normal ankle flexion and extension          Assessment & Plan:   Low back strain. Improving. We'll continue Tylenol moderate physical activity and heat therapy Return if not improved

## 2013-09-01 ENCOUNTER — Telehealth: Payer: Self-pay | Admitting: Internal Medicine

## 2013-09-01 NOTE — Telephone Encounter (Signed)
Relevant patient education assigned to patient using Emmi. ° °

## 2013-11-08 ENCOUNTER — Encounter (INDEPENDENT_AMBULATORY_CARE_PROVIDER_SITE_OTHER): Payer: Medicare Other | Admitting: Ophthalmology

## 2013-11-08 DIAGNOSIS — I10 Essential (primary) hypertension: Secondary | ICD-10-CM

## 2013-11-08 DIAGNOSIS — H43819 Vitreous degeneration, unspecified eye: Secondary | ICD-10-CM

## 2013-11-08 DIAGNOSIS — H353290 Exudative age-related macular degeneration, unspecified eye, stage unspecified: Secondary | ICD-10-CM

## 2013-11-08 DIAGNOSIS — H35039 Hypertensive retinopathy, unspecified eye: Secondary | ICD-10-CM

## 2013-11-08 DIAGNOSIS — H3530 Unspecified macular degeneration: Secondary | ICD-10-CM

## 2013-11-09 ENCOUNTER — Ambulatory Visit (INDEPENDENT_AMBULATORY_CARE_PROVIDER_SITE_OTHER): Payer: Medicare Other | Admitting: Cardiology

## 2013-11-09 DIAGNOSIS — I2510 Atherosclerotic heart disease of native coronary artery without angina pectoris: Secondary | ICD-10-CM

## 2013-11-09 NOTE — Progress Notes (Signed)
Exercise Treadmill Test  Pre-Exercise Testing Evaluation Rhythm: normal sinus  Rate: 73 bpm     Test  Exercise Tolerance Test Ordering MD: Marijo File, MD  Interpreting MD: Marijo File, MD  Unique Test No: 1  Treadmill:  1  Indication for ETT: known ASHD  Contraindication to ETT: No   Stress Modality: exercise - treadmill  Cardiac Imaging Performed: non   Protocol: standard Bruce - maximal  Max BP:  189/95  Max MPHR (bpm):  133 85% MPR (bpm):  113  MPHR obtained (bpm):  137 % MPHR obtained:  102  Reached 85% MPHR (min:sec):  1:05 Total Exercise Time (min-sec):  3:30  Workload in METS:  5.2 Borg Scale: 15  Reason ETT Terminated:  desired heart rate attained    ST Segment Analysis At Rest: normal ST segments - no evidence of significant ST depression With Exercise: no evidence of significant ST depression  Other Information Arrhythmia:  No Angina during ETT:  absent (0) Quality of ETT:  diagnostic  ETT Interpretation:  normal - no evidence of ischemia by ST analysis  Comments: The patient had an good exercise tolerance for his age.  There was no chest pain.  There was an appropriate level of dyspnea.  There were no arrhythmias other than one couplet, a normal heart rate response and an accelerated BP response.  There were no ischemic ST T wave changes.   He did not have a normal heart rate recovery.  Recommendations: Negative adequate ETT.  No further testing is indicated.  Based on the above I gave the patient a prescription for exercise.

## 2013-11-28 ENCOUNTER — Other Ambulatory Visit (INDEPENDENT_AMBULATORY_CARE_PROVIDER_SITE_OTHER): Payer: Medicare Other

## 2013-11-28 DIAGNOSIS — E039 Hypothyroidism, unspecified: Secondary | ICD-10-CM

## 2013-11-28 DIAGNOSIS — Z0000 Encounter for general adult medical examination without abnormal findings: Secondary | ICD-10-CM

## 2013-11-28 LAB — POCT URINALYSIS DIPSTICK
Bilirubin, UA: NEGATIVE
Blood, UA: NEGATIVE
Glucose, UA: NEGATIVE
Ketones, UA: NEGATIVE
Leukocytes, UA: NEGATIVE
NITRITE UA: NEGATIVE
SPEC GRAV UA: 1.02
UROBILINOGEN UA: 0.2
pH, UA: 7

## 2013-11-28 LAB — CBC WITH DIFFERENTIAL/PLATELET
BASOS ABS: 0 10*3/uL (ref 0.0–0.1)
BASOS PCT: 0.4 % (ref 0.0–3.0)
Eosinophils Absolute: 0.1 10*3/uL (ref 0.0–0.7)
Eosinophils Relative: 2.3 % (ref 0.0–5.0)
HEMATOCRIT: 44.5 % (ref 39.0–52.0)
HEMOGLOBIN: 14.9 g/dL (ref 13.0–17.0)
LYMPHS ABS: 1.3 10*3/uL (ref 0.7–4.0)
Lymphocytes Relative: 20.4 % (ref 12.0–46.0)
MCHC: 33.5 g/dL (ref 30.0–36.0)
MCV: 92.5 fl (ref 78.0–100.0)
MONOS PCT: 10.4 % (ref 3.0–12.0)
Monocytes Absolute: 0.6 10*3/uL (ref 0.1–1.0)
NEUTROS ABS: 4.2 10*3/uL (ref 1.4–7.7)
Neutrophils Relative %: 66.5 % (ref 43.0–77.0)
Platelets: 178 10*3/uL (ref 150.0–400.0)
RBC: 4.81 Mil/uL (ref 4.22–5.81)
RDW: 13.9 % (ref 11.5–14.6)
WBC: 6.3 10*3/uL (ref 4.5–10.5)

## 2013-11-28 LAB — PSA: PSA: 31.86 ng/mL — ABNORMAL HIGH (ref 0.10–4.00)

## 2013-11-28 LAB — HEPATIC FUNCTION PANEL
ALK PHOS: 73 U/L (ref 39–117)
ALT: 18 U/L (ref 0–53)
AST: 26 U/L (ref 0–37)
Albumin: 3.7 g/dL (ref 3.5–5.2)
BILIRUBIN TOTAL: 1 mg/dL (ref 0.3–1.2)
Bilirubin, Direct: 0.2 mg/dL (ref 0.0–0.3)
Total Protein: 6.1 g/dL (ref 6.0–8.3)

## 2013-11-28 LAB — BASIC METABOLIC PANEL
BUN: 20 mg/dL (ref 6–23)
CO2: 31 meq/L (ref 19–32)
Calcium: 9.5 mg/dL (ref 8.4–10.5)
Chloride: 108 mEq/L (ref 96–112)
Creatinine, Ser: 1 mg/dL (ref 0.4–1.5)
GFR: 73.39 mL/min (ref >60.00)
GLUCOSE: 95 mg/dL (ref 70–99)
POTASSIUM: 4.4 meq/L (ref 3.5–5.1)
SODIUM: 144 meq/L (ref 135–145)

## 2013-11-28 LAB — TSH: TSH: 3.57 u[IU]/mL (ref 0.35–5.50)

## 2013-11-28 LAB — LIPID PANEL
CHOLESTEROL: 156 mg/dL (ref 0–200)
HDL: 51.7 mg/dL (ref >39.00)
LDL CALC: 89 mg/dL (ref 0–99)
Total CHOL/HDL Ratio: 3
Triglycerides: 75 mg/dL (ref 0.0–149.0)
VLDL: 15 mg/dL (ref 0.0–40.0)

## 2013-12-05 ENCOUNTER — Ambulatory Visit (INDEPENDENT_AMBULATORY_CARE_PROVIDER_SITE_OTHER): Payer: Medicare Other | Admitting: Internal Medicine

## 2013-12-05 ENCOUNTER — Encounter: Payer: Self-pay | Admitting: Internal Medicine

## 2013-12-05 VITALS — BP 130/70 | HR 76 | Temp 97.7°F | Resp 20 | Ht 66.5 in | Wt 147.0 lb

## 2013-12-05 DIAGNOSIS — Z0000 Encounter for general adult medical examination without abnormal findings: Secondary | ICD-10-CM

## 2013-12-05 DIAGNOSIS — I10 Essential (primary) hypertension: Secondary | ICD-10-CM

## 2013-12-05 DIAGNOSIS — E039 Hypothyroidism, unspecified: Secondary | ICD-10-CM

## 2013-12-05 DIAGNOSIS — Z23 Encounter for immunization: Secondary | ICD-10-CM

## 2013-12-05 DIAGNOSIS — E785 Hyperlipidemia, unspecified: Secondary | ICD-10-CM

## 2013-12-05 DIAGNOSIS — I2510 Atherosclerotic heart disease of native coronary artery without angina pectoris: Secondary | ICD-10-CM

## 2013-12-05 MED ORDER — TRAMADOL HCL 50 MG PO TABS: 50.0000 mg | ORAL_TABLET | Freq: Three times a day (TID) | ORAL | Status: DC | PRN

## 2013-12-05 MED ORDER — ESZOPICLONE 2 MG PO TABS: 2.0000 mg | ORAL_TABLET | Freq: Every evening | ORAL | Status: DC | PRN

## 2013-12-05 NOTE — Progress Notes (Signed)
Patient ID: Raymond Swanson, male   DOB: April 15, 1925, 80 y.o.   MRN: [id number]  Subjective:    Patient ID: Raymond Swanson, male    DOB: 13-Oct-1924, 80 y.o.   MRN: [id number]  HPI 80 year-old patient who is seen today for a preventive health examination.  He has a history of coronary artery disease status post CABG in 1991. He is followed by annually by cardiology. He continues to do well and walks 1 mile most days. He denies any exertional chest pain;  he does describe periods of some weakness and poor exercise capacity. In general he does quite well.  He has had a recent stress test by cardiology (2015). He is status post prostatectomy and is followed by urology with a history of increasing PSAs. He is scheduled for urology followup soon and a bone scan is planned. He denies any bone pain More recently he has been followed by ophthalmology due  to macular degeneration.  His only complaint is fatigue.  He states this has been present for about one year.  He does sleep poorly, with frequent naps through the day.  Alcohol-Tobacco  Smoking Status: never   Allergies (verified):  No Known Drug Allergies   Past History:  Past Medical History:   Prostate cancer, hx of- history of increasing PSAs  Skin cancer, hx of Mohs L ear  Coronary artery disease status post CABG (LIMA to  the LAD, SVG to the right coronary artery, and SVG to obtuse marginal in  1991)  Hyperlipidemia  Hypertension  Hypothyroidism  History of diplopia 2009  history of gastric ulcer, secondary to aspirin, January 2011  Macular degeneration  Past Surgical History:  Coronary artery bypass graft 91  Inguinal herniorrhaphy 94  Prostatectomy 1990  MOHS left ear 8/07, left cheek [facility] March 2010  Cataract extraction  Sigmoidoscopy 1999  Cardiolite stress test September 2007, October 2010  2015 upper panendoscopy January 2011   Family History:   father died age 87, pancreatic cancer  mother died age 24  One brother died  age 70 lung cancer  4 sisters   Social History:   Retired  Married  remote tobacco use  MWE  1. Risk factors, based on past  M,S,F history-  patient has known coronary artery disease. Cardiovascular risk factors include hypertension dyslipidemia remote tobacco use  2.  Physical activities: He remains quite active for 80 years of age. He does walk 1 mile most days of the week. He performs calisthenics and also does some balance-type exercises.  3.  Depression/mood: No history of depression or mood disorder  4.  Hearing: Wears hearing aids bilaterally 5.  ADL's: Independent in all aspects of daily living  6.  Fall risk: Mild to moderate do to  age  10.  Home safety: No problems identified  8.  Height weight, and visual acuity; height and weight stable no change in visual acuity. Wears glasses  9.  Counseling: Heart healthy diet regular exercise encouraged  10. Lab orders based on risk factors: Laboratory profile reviewed unremarkable except for a PSA of 20  11. Referral : Followup urology and cardiology  12. Care plan: Consider follow stress test in the fall. Bone scan scheduled for the near future  13. Cognitive assessment: Alert and oriented normal affect. No cognitive dysfunction  Past Medical History  Diagnosis Date  . Cancer   . ACUT GASTR ULCER W/HEMORR W/O MENTION OBST 09/10/2009  . ANEMIA 10/08/2009  . CORONARY ARTERY DISEASE 01/26/2007  .  HYPERLIPIDEMIA 01/26/2007  . HYPERTENSION 01/26/2007  . HYPOTHYROIDISM 10/18/2007  . MELENA 08/28/2009  . PROSTATE CANCER, HX OF 01/26/2007  . SKIN CANCER, HX OF 01/26/2007    History   Social History  . Marital Status: Married    Spouse Name: N/A    Number of Children: N/A  . Years of Education: N/A   Occupational History  . Not on file.   Social History Main Topics  . Smoking status: Former Smoker -- 1.00 packs/day for 20 years    Types: Cigarettes    Quit date: 10/28/1948  . Smokeless tobacco: Never Used  . Alcohol Use:  Yes  . Drug Use: No  . Sexual Activity: Not on file   Other Topics Concern  . Not on file   Social History Narrative  . No narrative on file    Past Surgical History  Procedure Laterality Date  . Coronary artery bypass graft      1991  . Hernia repair      ingunial  . Prostate surgery      prostatectomy  . Mohs surgery    . Cataract extraction      Family History  Problem Relation Age of Onset  . Lung cancer Brother 55  . GI problems Sister     No Known Allergies  Current Outpatient Prescriptions on File Prior to Visit  Medication Sig Dispense Refill  . aspirin 81 MG tablet Take 81 mg by mouth daily.        . calcium gluconate 500 MG tablet Take 500 mg by mouth daily.        . cholecalciferol (VITAMIN D) 1000 UNITS tablet Take 1,000 Units by mouth daily.        . diphenhydramine-acetaminophen (TYLENOL PM) 25-500 MG TABS Take 1 tablet by mouth at bedtime as needed.        . fluorouracil (EFUDEX) 5 % cream Apply topically 2 (two) times daily.  40 g  2  . levothyroxine (LEVOXYL) 50 MCG tablet Take 1 tablet (50 mcg total) by mouth daily.  90 tablet  3  . Multiple Vitamin (MULTIVITAMIN) tablet Take 1 tablet by mouth daily.        . Multiple Vitamins-Minerals (PRESERVISION/LUTEIN PO) Take by mouth. Daily         . nitroGLYCERIN (NITROSTAT) 0.4 MG SL tablet Place 1 tablet (0.4 mg total) under the tongue every 5 (five) minutes as needed.  90 tablet  3  . Omega-3 Fatty Acids (OMEGA-3 FISH OIL PO) Take by mouth daily.      . simvastatin (ZOCOR) 20 MG tablet Take 1 tablet (20 mg total) by mouth daily.  90 tablet  3   No current facility-administered medications on file prior to visit.    BP 130/70  Pulse 76  Temp(Src) 97.7 F (36.5 C) (Oral)  Resp 20  Ht 5' 6.5" (1.689 m)  Wt 147 lb (66.679 kg)  BMI 23.37 kg/m2  SpO2 98%        Review of Systems  Constitutional: Negative for fever, chills, activity change, appetite change and fatigue.  HENT: Negative for  congestion, dental problem, ear pain, hearing loss, mouth sores, rhinorrhea, sinus pressure, sneezing, tinnitus, trouble swallowing and voice change.   Eyes: Negative for photophobia, pain, redness and visual disturbance.  Respiratory: Negative for apnea, cough, choking, chest tightness, shortness of breath and wheezing.   Cardiovascular: Negative for chest pain, palpitations and leg swelling.  Gastrointestinal: Negative for nausea, vomiting, abdominal pain, diarrhea, constipation,  blood in stool, abdominal distention, anal bleeding and rectal pain.  Genitourinary: Negative for dysuria, urgency, frequency, hematuria, flank pain, decreased urine volume, discharge, penile swelling, scrotal swelling, difficulty urinating, genital sores and testicular pain.  Musculoskeletal: Negative for arthralgias, back pain, gait problem, joint swelling, myalgias, neck pain and neck stiffness.  Skin: Negative for color change, rash and wound.  Neurological: Positive for weakness. Negative for dizziness, tremors, seizures, syncope, facial asymmetry, speech difficulty, light-headedness, numbness and headaches.  Hematological: Negative for adenopathy. Does not bruise/bleed easily.  Psychiatric/Behavioral: Negative for suicidal ideas, hallucinations, behavioral problems, confusion, sleep disturbance, self-injury, dysphoric mood, decreased concentration and agitation. The patient is not nervous/anxious.        Objective:   Physical Exam  Constitutional: He appears well-developed and well-nourished.  HENT:  Head: Normocephalic and atraumatic.  Right Ear: External ear normal.  Left Ear: External ear normal.  Nose: Nose normal.  Mouth/Throat: Oropharynx is clear and moist.  Hearing aids bilaterally  Eyes: Conjunctivae and EOM are normal. Pupils are equal, round, and reactive to light. No scleral icterus.  Neck: Normal range of motion. Neck supple. No JVD present. No thyromegaly present.  Decreased left carotid  upstroke but no bruit  Right supraclavicular bruit  Cardiovascular: Normal rate, normal heart sounds and intact distal pulses.  Exam reveals no gallop and no friction rub.   No murmur heard. Pedal pulses intact  Left femoral bruit  Pulmonary/Chest: Effort normal and breath sounds normal. He exhibits no tenderness.  Abdominal: Soft. Bowel sounds are normal. He exhibits no distension and no mass. There is no tenderness.  Genitourinary: Penis normal.  Musculoskeletal: Normal range of motion. He exhibits no edema and no tenderness.  Lymphadenopathy:    He has no cervical adenopathy.  Neurological: He is alert. He has normal reflexes. No cranial nerve deficit. Coordination normal.  Skin: Skin is warm and dry. No rash noted.  Onychomycotic nail changes of the toes  Psychiatric: He has a normal mood and affect. His behavior is normal.          Assessment & Plan:   Preventive health examination Hypothyroidism. TSH normal Coronary artery disease status post CABG Hypertension well controlled Dyslipidemia. Continue statin therapy Prostate cancer. Followup urology   Cardiology and urology followup. Recheck here one year. Flu vaccine in the fall Laboratory profile reviewed and unremarkable except for increasing PSA

## 2013-12-05 NOTE — Patient Instructions (Signed)
Limit your sodium (Salt) intake    It is important that you exercise regularly, at least 20 minutes 3 to 4 times per week.  If you develop chest pain or shortness of breath seek  medical attention.  Followup with urology and ophthalmology

## 2013-12-05 NOTE — Progress Notes (Signed)
Pre-visit discussion using our clinic review tool. No additional management support is needed unless otherwise documented below in the visit note.  

## 2013-12-28 ENCOUNTER — Other Ambulatory Visit: Payer: Self-pay | Admitting: Internal Medicine

## 2014-01-31 ENCOUNTER — Encounter (INDEPENDENT_AMBULATORY_CARE_PROVIDER_SITE_OTHER): Payer: Medicare Other | Admitting: Ophthalmology

## 2014-01-31 DIAGNOSIS — H353290 Exudative age-related macular degeneration, unspecified eye, stage unspecified: Secondary | ICD-10-CM

## 2014-01-31 DIAGNOSIS — I10 Essential (primary) hypertension: Secondary | ICD-10-CM

## 2014-01-31 DIAGNOSIS — H3530 Unspecified macular degeneration: Secondary | ICD-10-CM

## 2014-01-31 DIAGNOSIS — H35039 Hypertensive retinopathy, unspecified eye: Secondary | ICD-10-CM

## 2014-01-31 DIAGNOSIS — H43819 Vitreous degeneration, unspecified eye: Secondary | ICD-10-CM

## 2014-02-26 ENCOUNTER — Other Ambulatory Visit: Payer: Self-pay | Admitting: Internal Medicine

## 2014-03-09 ENCOUNTER — Other Ambulatory Visit (HOSPITAL_COMMUNITY): Payer: Self-pay | Admitting: Urology

## 2014-03-09 DIAGNOSIS — C61 Malignant neoplasm of prostate: Secondary | ICD-10-CM

## 2014-03-20 ENCOUNTER — Ambulatory Visit (HOSPITAL_COMMUNITY)
Admission: RE | Admit: 2014-03-20 | Discharge: 2014-03-20 | Disposition: A | Discharge status: Home / Self Care | Payer: Medicare Other | Source: Ambulatory Visit | Attending: Urology | Admitting: Urology

## 2014-03-20 ENCOUNTER — Encounter (HOSPITAL_COMMUNITY): Payer: Self-pay

## 2014-03-20 DIAGNOSIS — J438 Other emphysema: Secondary | ICD-10-CM | POA: Diagnosis not present

## 2014-03-20 DIAGNOSIS — M47817 Spondylosis without myelopathy or radiculopathy, lumbosacral region: Secondary | ICD-10-CM | POA: Insufficient documentation

## 2014-03-20 DIAGNOSIS — M899 Disorder of bone, unspecified: Secondary | ICD-10-CM | POA: Insufficient documentation

## 2014-03-20 DIAGNOSIS — I700 Atherosclerosis of aorta: Secondary | ICD-10-CM | POA: Insufficient documentation

## 2014-03-20 DIAGNOSIS — M949 Disorder of cartilage, unspecified: Secondary | ICD-10-CM

## 2014-03-20 DIAGNOSIS — C61 Malignant neoplasm of prostate: Secondary | ICD-10-CM | POA: Diagnosis present

## 2014-03-20 DIAGNOSIS — M8448XA Pathological fracture, other site, initial encounter for fracture: Secondary | ICD-10-CM | POA: Insufficient documentation

## 2014-03-20 DIAGNOSIS — Z9079 Acquired absence of other genital organ(s): Secondary | ICD-10-CM | POA: Insufficient documentation

## 2014-03-20 DIAGNOSIS — Z951 Presence of aortocoronary bypass graft: Secondary | ICD-10-CM | POA: Insufficient documentation

## 2014-03-20 MED ORDER — FLUDEOXYGLUCOSE F - 18 (FDG) INJECTION: 10.4000 | Freq: Once | INTRAVENOUS | Status: AC | PRN

## 2014-04-25 ENCOUNTER — Encounter (INDEPENDENT_AMBULATORY_CARE_PROVIDER_SITE_OTHER): Payer: Medicare Other | Admitting: Ophthalmology

## 2014-04-25 DIAGNOSIS — H35039 Hypertensive retinopathy, unspecified eye: Secondary | ICD-10-CM

## 2014-04-25 DIAGNOSIS — I10 Essential (primary) hypertension: Secondary | ICD-10-CM

## 2014-04-25 DIAGNOSIS — H3530 Unspecified macular degeneration: Secondary | ICD-10-CM

## 2014-04-25 DIAGNOSIS — H353290 Exudative age-related macular degeneration, unspecified eye, stage unspecified: Secondary | ICD-10-CM

## 2014-04-25 DIAGNOSIS — H43819 Vitreous degeneration, unspecified eye: Secondary | ICD-10-CM

## 2014-05-08 ENCOUNTER — Ambulatory Visit (INDEPENDENT_AMBULATORY_CARE_PROVIDER_SITE_OTHER): Payer: Medicare Other | Admitting: *Deleted

## 2014-05-08 DIAGNOSIS — Z23 Encounter for immunization: Secondary | ICD-10-CM

## 2014-07-18 ENCOUNTER — Encounter (INDEPENDENT_AMBULATORY_CARE_PROVIDER_SITE_OTHER): Payer: Medicare Other | Admitting: Ophthalmology

## 2014-07-18 DIAGNOSIS — H43813 Vitreous degeneration, bilateral: Secondary | ICD-10-CM

## 2014-07-18 DIAGNOSIS — H3532 Exudative age-related macular degeneration: Secondary | ICD-10-CM

## 2014-07-18 DIAGNOSIS — I10 Essential (primary) hypertension: Secondary | ICD-10-CM

## 2014-07-18 DIAGNOSIS — H3531 Nonexudative age-related macular degeneration: Secondary | ICD-10-CM

## 2014-07-18 DIAGNOSIS — H35033 Hypertensive retinopathy, bilateral: Secondary | ICD-10-CM

## 2014-08-24 DIAGNOSIS — S065XAA Traumatic subdural hemorrhage with loss of consciousness status unknown, initial encounter: Secondary | ICD-10-CM

## 2014-08-24 DIAGNOSIS — S065X9A Traumatic subdural hemorrhage with loss of consciousness of unspecified duration, initial encounter: Secondary | ICD-10-CM

## 2014-08-24 HISTORY — DX: Traumatic subdural hemorrhage with loss of consciousness of unspecified duration, initial encounter: S06.5X9A

## 2014-08-24 HISTORY — DX: Traumatic subdural hemorrhage with loss of consciousness status unknown, initial encounter: S06.5XAA

## 2014-10-24 ENCOUNTER — Encounter (INDEPENDENT_AMBULATORY_CARE_PROVIDER_SITE_OTHER): Payer: Medicare Other | Admitting: Ophthalmology

## 2014-10-24 DIAGNOSIS — H3532 Exudative age-related macular degeneration: Secondary | ICD-10-CM

## 2014-10-24 DIAGNOSIS — H3531 Nonexudative age-related macular degeneration: Secondary | ICD-10-CM | POA: Diagnosis not present

## 2014-10-24 DIAGNOSIS — H35033 Hypertensive retinopathy, bilateral: Secondary | ICD-10-CM

## 2014-10-24 DIAGNOSIS — I10 Essential (primary) hypertension: Secondary | ICD-10-CM

## 2014-10-24 DIAGNOSIS — H43813 Vitreous degeneration, bilateral: Secondary | ICD-10-CM | POA: Diagnosis not present

## 2014-11-19 ENCOUNTER — Encounter: Payer: Self-pay | Admitting: Cardiology

## 2014-11-19 ENCOUNTER — Ambulatory Visit (INDEPENDENT_AMBULATORY_CARE_PROVIDER_SITE_OTHER): Payer: Medicare Other | Admitting: Cardiology

## 2014-11-19 VITALS — BP 140/60 | HR 65 | Ht 68.0 in | Wt 143.6 lb

## 2014-11-19 DIAGNOSIS — I2510 Atherosclerotic heart disease of native coronary artery without angina pectoris: Secondary | ICD-10-CM | POA: Diagnosis not present

## 2014-11-19 NOTE — Patient Instructions (Signed)
Your physician wants you to follow-up in: [place] will receive a reminder letter in the mail two months in advance. If you don't receive a letter, please call our office to schedule the follow-up appointment.

## 2014-11-19 NOTE — Progress Notes (Signed)
HPI The patient presents for follow up of CAD.  Since I last saw him he has done well.  The patient denies any new symptoms such as chest discomfort, neck or arm discomfort. There has been no new shortness of breath, PND or orthopnea. There have been no reported palpitations, presyncope or syncope.   He still walks daily.  He walks his dog and does other activity.   No Known Allergies  Current Outpatient Prescriptions  Medication Sig Dispense Refill  . aspirin 81 MG tablet Take 81 mg by mouth daily.      . calcium gluconate 500 MG tablet Take 500 mg by mouth daily.      . cholecalciferol (VITAMIN D) 1000 UNITS tablet Take 1,000 Units by mouth daily.      . diphenhydramine-acetaminophen (TYLENOL PM) 25-500 MG TABS Take 1 tablet by mouth at bedtime as needed.      . eszopiclone (LUNESTA) 2 MG TABS tablet Take 1 tablet (2 mg total) by mouth at bedtime as needed. Take immediately before bedtime 30 tablet 2  . fluorouracil (EFUDEX) 5 % cream Apply topically 2 (two) times daily. 40 g 2  . levothyroxine (SYNTHROID, LEVOTHROID) 50 MCG tablet take 1 tablet by mouth once daily 90 tablet 3  . Melatonin 5 MG TABS Take 2.5 mg by mouth at bedtime.    . Multiple Vitamin (MULTIVITAMIN) tablet Take 1 tablet by mouth daily.      . Multiple Vitamins-Minerals (PRESERVISION/LUTEIN PO) Take by mouth. Daily       . nitroGLYCERIN (NITROSTAT) 0.4 MG SL tablet Place 1 tablet (0.4 mg total) under the tongue every 5 (five) minutes as needed. 90 tablet 3  . Omega-3 Fatty Acids (OMEGA-3 FISH OIL PO) Take by mouth daily.    . simvastatin (ZOCOR) 20 MG tablet take 1 tablet by mouth once daily 90 tablet 3  . traMADol (ULTRAM) 50 MG tablet Take 1 tablet (50 mg total) by mouth every 8 (eight) hours as needed. 30 tablet 5   No current facility-administered medications for this visit.    Past Medical History  Diagnosis Date  . Cancer   . ACUT GASTR ULCER W/HEMORR W/O MENTION OBST 09/10/2009  . ANEMIA 10/08/2009  .  CORONARY ARTERY DISEASE 01/26/2007  . HYPERLIPIDEMIA 01/26/2007  . HYPERTENSION 01/26/2007  . HYPOTHYROIDISM 10/18/2007  . MELENA 08/28/2009  . PROSTATE CANCER, HX OF 01/26/2007  . SKIN CANCER, HX OF 01/26/2007    Past Surgical History  Procedure Laterality Date  . Coronary artery bypass graft      1991  . Hernia repair      ingunial  . Prostate surgery      prostatectomy  . Mohs surgery    . Cataract extraction      ROS:  Hard of hearing.  Otherwise as here in stated in the HPI and negative for all other systems.  PHYSICAL EXAM BP 140/60 mmHg  Pulse 65  Ht 5\' 8"  (1.727 m)  Wt 143 lb 9.6 oz (65.137 kg)  BMI 21.84 kg/m2 GENERAL:  Well appearing HEENT:  Pupils equal round and reactive, fundi not visualized, oral mucosa unremarkable NECK:  No jugular venous distention, waveform within normal limits, carotid upstroke brisk and symmetric, no bruits, no thyromegaly LUNGS:  Clear to auscultation bilaterally BACK:  No CVA tenderness CHEST:  Well healed sternotomy scar. HEART:  PMI not displaced or sustained,S1 and S2 within normal limits, no S3, no S4, no clicks, no rubs, no murmurs ABD:  Flat, positive  bowel sounds normal in frequency in pitch, no bruits, no rebound, no guarding, no midline pulsatile mass, no hepatomegaly, no splenomegaly EXT:  2 plus pulses throughout, no edema, no cyanosis no clubbing   EKG:  Sinus rhythm, rate 65, axis within normal limits, intervals within normal limits, no acute ST-T wave changes.  PVCs. 11/19/2014   ASSESSMENT AND PLAN  CORONARY ARTERY DISEASE -  He has had no new symptoms. He had a negative POET (Plain Old Exercise Treadmill) last year.  No change in therapy is planned.   HYPERTENSION -  The blood pressure is at target. No change in medications is indicated. We will continue with therapeutic lifestyle changes (TLC).  HYPERLIPIDEMIA -  He is due to have lipids drawn in April.  I will defer to Nyoka Cowden, MD.  His lipids last year  were at goal.

## 2014-11-23 ENCOUNTER — Telehealth: Payer: Self-pay

## 2014-11-23 MED ORDER — NITROGLYCERIN 0.4 MG SL SUBL: 0.4000 mg | SUBLINGUAL_TABLET | SUBLINGUAL | Status: DC | PRN

## 2014-11-23 NOTE — Telephone Encounter (Signed)
Rx sent to pharmacy   

## 2014-11-23 NOTE — Telephone Encounter (Signed)
[facility] refill request for NITROSTAT 0.4MG 

## 2014-12-04 ENCOUNTER — Other Ambulatory Visit: Payer: Self-pay

## 2014-12-11 ENCOUNTER — Other Ambulatory Visit (INDEPENDENT_AMBULATORY_CARE_PROVIDER_SITE_OTHER): Payer: Medicare Other

## 2014-12-11 ENCOUNTER — Encounter: Payer: Self-pay | Admitting: Internal Medicine

## 2014-12-11 DIAGNOSIS — Z0000 Encounter for general adult medical examination without abnormal findings: Secondary | ICD-10-CM

## 2014-12-11 LAB — CBC WITH DIFFERENTIAL/PLATELET
Basophils Absolute: 0 10*3/uL (ref 0.0–0.1)
Basophils Relative: 0.4 % (ref 0.0–3.0)
EOS PCT: 1.1 % (ref 0.0–5.0)
Eosinophils Absolute: 0.1 10*3/uL (ref 0.0–0.7)
HEMATOCRIT: 45 % (ref 39.0–52.0)
Hemoglobin: 15 g/dL (ref 13.0–17.0)
LYMPHS ABS: 1 10*3/uL (ref 0.7–4.0)
Lymphocytes Relative: 16.2 % (ref 12.0–46.0)
MCHC: 33.4 g/dL (ref 30.0–36.0)
MCV: 92.2 fl (ref 78.0–100.0)
MONO ABS: 0.5 10*3/uL (ref 0.1–1.0)
Monocytes Relative: 8.6 % (ref 3.0–12.0)
Neutro Abs: 4.3 10*3/uL (ref 1.4–7.7)
Neutrophils Relative %: 73.7 % (ref 43.0–77.0)
Platelets: 187 10*3/uL (ref 150.0–400.0)
RBC: 4.89 Mil/uL (ref 4.22–5.81)
RDW: 14.6 % (ref 11.5–15.5)
WBC: 5.9 10*3/uL (ref 4.0–10.5)

## 2014-12-11 LAB — HEPATIC FUNCTION PANEL
ALBUMIN: 3.9 g/dL (ref 3.5–5.2)
ALT: 17 U/L (ref 0–53)
AST: 23 U/L (ref 0–37)
Alkaline Phosphatase: 78 U/L (ref 39–117)
Bilirubin, Direct: 0.2 mg/dL (ref 0.0–0.3)
TOTAL PROTEIN: 6.1 g/dL (ref 6.0–8.3)
Total Bilirubin: 1.1 mg/dL (ref 0.2–1.2)

## 2014-12-11 LAB — LIPID PANEL
CHOLESTEROL: 146 mg/dL (ref 0–200)
HDL: 54 mg/dL (ref >39.00)
LDL Cholesterol: 76 mg/dL (ref 0–99)
NonHDL: 92
Total CHOL/HDL Ratio: 3
Triglycerides: 79 mg/dL (ref 0.0–149.0)
VLDL: 15.8 mg/dL (ref 0.0–40.0)

## 2014-12-11 LAB — BASIC METABOLIC PANEL
BUN: 21 mg/dL (ref 6–23)
CHLORIDE: 107 meq/L (ref 96–112)
CO2: 31 meq/L (ref 19–32)
CREATININE: 1.02 mg/dL (ref 0.40–1.50)
Calcium: 9.4 mg/dL (ref 8.4–10.5)
GFR: 73.21 mL/min (ref >60.00)
Glucose, Bld: 108 mg/dL — ABNORMAL HIGH (ref 70–99)
Potassium: 4.2 mEq/L (ref 3.5–5.1)
SODIUM: 142 meq/L (ref 135–145)

## 2014-12-11 LAB — POCT URINALYSIS DIPSTICK
BILIRUBIN UA: NEGATIVE
Blood, UA: NEGATIVE
Glucose, UA: NEGATIVE
LEUKOCYTES UA: NEGATIVE
NITRITE UA: NEGATIVE
Protein, UA: NEGATIVE
Spec Grav, UA: 1.025
UROBILINOGEN UA: 0.2
pH, UA: 5

## 2014-12-11 LAB — TSH: TSH: 3.37 u[IU]/mL (ref 0.35–4.50)

## 2014-12-11 LAB — PSA: PSA: 43.55 ng/mL — ABNORMAL HIGH (ref 0.10–4.00)

## 2014-12-18 ENCOUNTER — Encounter: Payer: Self-pay | Admitting: Internal Medicine

## 2014-12-25 ENCOUNTER — Ambulatory Visit (INDEPENDENT_AMBULATORY_CARE_PROVIDER_SITE_OTHER): Payer: Medicare Other | Admitting: Internal Medicine

## 2014-12-25 ENCOUNTER — Encounter: Payer: Self-pay | Admitting: Internal Medicine

## 2014-12-25 VITALS — BP 110/70 | HR 70 | Temp 97.8°F | Resp 18 | Ht 66.5 in | Wt 144.0 lb

## 2014-12-25 DIAGNOSIS — E785 Hyperlipidemia, unspecified: Secondary | ICD-10-CM

## 2014-12-25 DIAGNOSIS — Z0000 Encounter for general adult medical examination without abnormal findings: Secondary | ICD-10-CM | POA: Diagnosis not present

## 2014-12-25 DIAGNOSIS — I2510 Atherosclerotic heart disease of native coronary artery without angina pectoris: Secondary | ICD-10-CM

## 2014-12-25 DIAGNOSIS — I10 Essential (primary) hypertension: Secondary | ICD-10-CM

## 2014-12-25 DIAGNOSIS — E034 Atrophy of thyroid (acquired): Secondary | ICD-10-CM

## 2014-12-25 NOTE — Patient Instructions (Signed)
Limit your sodium (Salt) intake    It is important that you exercise regularly, at least 20 minutes 3 to 4 times per week.  If you develop chest pain or shortness of breath seek  medical attention.  Return in one year for follow-up  Health Maintenance A healthy lifestyle and preventative care can promote health and wellness.  Maintain regular health, dental, and eye exams.  Eat a healthy diet. Foods like vegetables, fruits, whole grains, low-fat dairy products, and lean protein foods contain the nutrients you need and are low in calories. Decrease your intake of foods high in solid fats, added sugars, and salt. Get information about a proper diet from your health care provider, if necessary.  Regular physical exercise is one of the most important things you can do for your health. Most adults should get at least 150 minutes of moderate-intensity exercise (any activity that increases your heart rate and causes you to sweat) each week. In addition, most adults need muscle-strengthening exercises on 2 or more days a week.   Maintain a healthy weight. The body mass index (BMI) is a screening tool to identify possible weight problems. It provides an estimate of body fat based on height and weight. Your health care provider can find your BMI and can help you achieve or maintain a healthy weight. For males 20 years and older:  A BMI below 18.5 is considered underweight.  A BMI of 18.5 to 24.9 is normal.  A BMI of 25 to 29.9 is considered overweight.  A BMI of 30 and above is considered obese.  Maintain normal blood lipids and cholesterol by exercising and minimizing your intake of saturated fat. Eat a balanced diet with plenty of fruits and vegetables. Blood tests for lipids and cholesterol should begin at age 19 and be repeated every 5 years. If your lipid or cholesterol levels are high, you are over age 11, or you are at high risk for heart disease, you may need your cholesterol levels checked  more frequently.Ongoing high lipid and cholesterol levels should be treated with medicines if diet and exercise are not working.  If you smoke, find out from your health care provider how to quit. If you do not use tobacco, do not start.  Lung cancer screening is recommended for adults aged 56-80 years who are at high risk for developing lung cancer because of a history of smoking. A yearly low-dose CT scan of the lungs is recommended for people who have at least a 30-pack-year history of smoking and are current smokers or have quit within the past 15 years. A pack year of smoking is smoking an average of 1 pack of cigarettes a day for 1 year (for example, a 30-pack-year history of smoking could mean smoking 1 pack a day for 30 years or 2 packs a day for 15 years). Yearly screening should continue until the smoker has stopped smoking for at least 15 years. Yearly screening should be stopped for people who develop a health problem that would prevent them from having lung cancer treatment.  If you choose to drink alcohol, do not have more than 2 drinks per day. One drink is considered to be 12 oz (360 mL) of beer, 5 oz (150 mL) of wine, or 1.5 oz (45 mL) of liquor.  Avoid the use of street drugs. Do not share needles with anyone. Ask for help if you need support or instructions about stopping the use of drugs.  High blood pressure causes heart  disease and increases the risk of stroke. Blood pressure should be checked at least every 1-2 years. Ongoing high blood pressure should be treated with medicines if weight loss and exercise are not effective.  If you are 53-34 years old, ask your health care provider if you should take aspirin to prevent heart disease.  Diabetes screening involves taking a blood sample to check your fasting blood sugar level. This should be done once every 3 years after age 36 if you are at a normal weight and without risk factors for diabetes. Testing should be considered at a  younger age or be carried out more frequently if you are overweight and have at least 1 risk factor for diabetes.  Colorectal cancer can be detected and often prevented. Most routine colorectal cancer screening begins at the age of 92 and continues through age 46. However, your health care provider may recommend screening at an earlier age if you have risk factors for colon cancer. On a yearly basis, your health care provider may provide home test kits to check for hidden blood in the stool. A small camera at the end of a tube may be used to directly examine the colon (sigmoidoscopy or colonoscopy) to detect the earliest forms of colorectal cancer. Talk to your health care provider about this at age 64 when routine screening begins. A direct exam of the colon should be repeated every 5-10 years through age 66, unless early forms of precancerous polyps or small growths are found.  People who are at an increased risk for hepatitis B should be screened for this virus. You are considered at high risk for hepatitis B if:  You were born in a country where hepatitis B occurs often. Talk with your health care provider about which countries are considered high risk.  Your parents were born in a high-risk country and you have not received a shot to protect against hepatitis B (hepatitis B vaccine).  You have HIV or AIDS.  You use needles to inject street drugs.  You live with, or have sex with, someone who has hepatitis B.  You are a man who has sex with other men (MSM).  You get hemodialysis treatment.  You take certain medicines for conditions like cancer, organ transplantation, and autoimmune conditions.  Hepatitis C blood testing is recommended for all people born from [birth year] through 1965 and any individual with known risk factors for hepatitis C.  Healthy men should no longer receive prostate-specific antigen (PSA) blood tests as part of routine cancer screening. Talk to your health care provider  about prostate cancer screening.  Testicular cancer screening is not recommended for adolescents or adult males who have no symptoms. Screening includes self-exam, a health care provider exam, and other screening tests. Consult with your health care provider about any symptoms you have or any concerns you have about testicular cancer.  Practice safe sex. Use condoms and avoid high-risk sexual practices to reduce the spread of sexually transmitted infections (STIs).  You should be screened for STIs, including gonorrhea and chlamydia if:  You are sexually active and are younger than 24 years.  You are older than 24 years, and your health care provider tells you that you are at risk for this type of infection.  Your sexual activity has changed since you were last screened, and you are at an increased risk for chlamydia or gonorrhea. Ask your health care provider if you are at risk.  If you are at risk of being infected  with HIV, it is recommended that you take a prescription medicine daily to prevent HIV infection. This is called pre-exposure prophylaxis (PrEP). You are considered at risk if:  You are a man who has sex with other men (MSM).  You are a heterosexual man who is sexually active with multiple partners.  You take drugs by injection.  You are sexually active with a partner who has HIV.  Talk with your health care provider about whether you are at high risk of being infected with HIV. If you choose to begin PrEP, you should first be tested for HIV. You should then be tested every 3 months for as long as you are taking PrEP.  Use sunscreen. Apply sunscreen liberally and repeatedly throughout the day. You should seek shade when your shadow is shorter than you. Protect yourself by wearing long sleeves, pants, a wide-brimmed hat, and sunglasses year round whenever you are outdoors.  Tell your health care provider of new moles or changes in moles, especially if there is a change in shape  or color. Also, tell your health care provider if a mole is larger than the size of a pencil eraser.  A one-time screening for abdominal aortic aneurysm (AAA) and surgical repair of large AAAs by ultrasound is recommended for men aged 12-75 years who are current or former smokers.  Stay current with your vaccines (immunizations). Document Released: 02/06/2008 Document Revised: 08/15/2013 Document Reviewed: 01/05/2011 [facility] Patient Information 2015 [place], [place]. This information is not intended to replace advice given to you by your health care provider. Make sure you discuss any questions you have with your health care provider.

## 2014-12-25 NOTE — Progress Notes (Signed)
Patient ID: [name], male   DOB: 12-13-1924, 81 y.o.   MRN: [id number]  Subjective:    Patient ID: Raymond Swanson, male    DOB: Jan 21, 1925, 81 y.o.   MRN: [id number]  HPI 81  year-old patient who is seen today for a preventive health examination.  He has a history of coronary artery disease status post CABG in 1991. He is followed by annually by cardiology. He continues to do well and walks 1 mile most days. He denies any exertional chest pain;  he does describe periods of some weakness and poor exercise capacity. In general he does quite well.  He has had a recent stress test by cardiology (2015). He is status post prostatectomy and is followed by urology with a history of increasing PSAs. He is scheduled for urology followup soon and a bone scan is planned.  He did have a PET scan performed in July 2015 that was unremarkable for metastatic disease. He denies any bone pain More recently he has been followed by ophthalmology due  to macular degeneration.  His only complaint is fatigue.  He states this has been present for about one year.  He does sleep poorly, with frequent naps through the day.  He has been given a trial off statin therapy without improvement of his fatigue.  He has resumed medication   Alcohol-Tobacco  Smoking Status: never   Allergies (verified):  No Known Drug Allergies   Past History:  Past Medical History:   Prostate cancer, hx of- history of increasing PSAs  Skin cancer, hx of Mohs L ear  Coronary artery disease status post CABG (LIMA to  the LAD, SVG to the right coronary artery, and SVG to obtuse marginal in  1991)  Hyperlipidemia  Hypertension  Hypothyroidism  History of diplopia 2009  history of gastric ulcer, secondary to aspirin, January 2011  Macular degeneration  Past Surgical History:  Coronary artery bypass graft 91  Inguinal herniorrhaphy 94  Prostatectomy 1990  MOHS left ear 8/07, left cheek [facility] March 2010  Cataract extraction   Sigmoidoscopy 1999  Cardiolite stress test September 2007, October 2010  2015 upper panendoscopy January 2011   Family History:   father died age 83, pancreatic cancer  mother died age 51  One brother died age 17 lung cancer  4 sisters   Social History:   Retired  Married  remote tobacco use  MWE  1. Risk factors, based on past  M,S,F history-  patient has known coronary artery disease. Cardiovascular risk factors include hypertension dyslipidemia remote tobacco use  2.  Physical activities: He remains quite active for 81 years of age. He does walk 1 mile most days of the week. He performs calisthenics and also does some balance-type exercises.  3.  Depression/mood: No history of depression or mood disorder  4.  Hearing: Wears hearing aids bilaterally 5.  ADL's: Independent in all aspects of daily living  6.  Fall risk: Mild to moderate do to  age  74.  Home safety: No problems identified  8.  Height weight, and visual acuity; height and weight stable no change in visual acuity. Wears glasses  9.  Counseling: Heart healthy diet regular exercise encouraged  10. Lab orders based on risk factors: Laboratory profile reviewed unremarkable except for a PSA of 20  11. Referral : Followup urology and cardiology  12. Care plan: Consider follow stress test in the fall. Bone scan scheduled for the near future  13. Cognitive assessment:  Alert and oriented normal affect. No cognitive dysfunction  14.  Preventive services will include an annual health examination.  He'll be seen by cardiology annually and urology.  Twice a year.  He will be monitored for prostate cancer.  Disease progression with appropriate imaging studies.  He will be followed closely by ophthalmology  15.  Provider list includes cardiology, primary care urology podiatry and ophthalmology  Past Medical History  Diagnosis Date  . Cancer   . ACUT GASTR ULCER W/HEMORR W/O MENTION OBST 09/10/2009  . ANEMIA  10/08/2009  . CORONARY ARTERY DISEASE 01/26/2007  . HYPERLIPIDEMIA 01/26/2007  . HYPERTENSION 01/26/2007  . HYPOTHYROIDISM 10/18/2007  . MELENA 08/28/2009  . PROSTATE CANCER, HX OF 01/26/2007  . SKIN CANCER, HX OF 01/26/2007    History   Social History  . Marital Status: Married    Spouse Name: N/A  . Number of Children: N/A  . Years of Education: N/A   Occupational History  . Not on file.   Social History Main Topics  . Smoking status: Former Smoker -- 1.00 packs/day for 20 years    Types: Cigarettes    Quit date: 10/28/1948  . Smokeless tobacco: Never Used  . Alcohol Use: Yes  . Drug Use: No  . Sexual Activity: Not on file   Other Topics Concern  . Not on file   Social History Narrative    Past Surgical History  Procedure Laterality Date  . Coronary artery bypass graft      1991  . Hernia repair      ingunial  . Prostate surgery      prostatectomy  . Mohs surgery    . Cataract extraction      Family History  Problem Relation Age of Onset  . Lung cancer Brother 59  . GI problems Sister     No Known Allergies  Current Outpatient Prescriptions on File Prior to Visit  Medication Sig Dispense Refill  . aspirin 81 MG tablet Take 81 mg by mouth daily.      . calcium gluconate 500 MG tablet Take 500 mg by mouth daily.      . cholecalciferol (VITAMIN D) 1000 UNITS tablet Take 1,000 Units by mouth daily.      . diphenhydramine-acetaminophen (TYLENOL PM) 25-500 MG TABS Take 1 tablet by mouth at bedtime as needed.      . fluorouracil (EFUDEX) 5 % cream Apply topically 2 (two) times daily. 40 g 2  . levothyroxine (SYNTHROID, LEVOTHROID) 50 MCG tablet take 1 tablet by mouth once daily 90 tablet 3  . Melatonin 5 MG TABS Take 2.5 mg by mouth at bedtime.    . Multiple Vitamin (MULTIVITAMIN) tablet Take 1 tablet by mouth daily.      . Multiple Vitamins-Minerals (PRESERVISION/LUTEIN PO) Take by mouth. Daily       . nitroGLYCERIN (NITROSTAT) 0.4 MG SL tablet Place 1 tablet (0.4  mg total) under the tongue every 5 (five) minutes as needed. 90 tablet 3  . Omega-3 Fatty Acids (OMEGA-3 FISH OIL PO) Take by mouth daily.    . simvastatin (ZOCOR) 20 MG tablet take 1 tablet by mouth once daily 90 tablet 3   No current facility-administered medications on file prior to visit.    BP 110/70 mmHg  Pulse 70  Temp(Src) 97.8 F (36.6 C) (Oral)  Resp 18  Ht 5' 6.5" (1.689 m)  Wt 144 lb (65.318 kg)  BMI 22.90 kg/m2  SpO2 97%  Review of Systems  Constitutional: Negative for fever, chills, activity change, appetite change and fatigue.  HENT: Negative for congestion, dental problem, ear pain, hearing loss, mouth sores, rhinorrhea, sinus pressure, sneezing, tinnitus, trouble swallowing and voice change.   Eyes: Negative for photophobia, pain, redness and visual disturbance.  Respiratory: Negative for apnea, cough, choking, chest tightness, shortness of breath and wheezing.   Cardiovascular: Negative for chest pain, palpitations and leg swelling.  Gastrointestinal: Negative for nausea, vomiting, abdominal pain, diarrhea, constipation, blood in stool, abdominal distention, anal bleeding and rectal pain.  Genitourinary: Negative for dysuria, urgency, frequency, hematuria, flank pain, decreased urine volume, discharge, penile swelling, scrotal swelling, difficulty urinating, genital sores and testicular pain.  Musculoskeletal: Negative for myalgias, back pain, joint swelling, arthralgias, gait problem, neck pain and neck stiffness.  Skin: Negative for color change, rash and wound.  Neurological: Positive for weakness. Negative for dizziness, tremors, seizures, syncope, facial asymmetry, speech difficulty, light-headedness, numbness and headaches.  Hematological: Negative for adenopathy. Does not bruise/bleed easily.  Psychiatric/Behavioral: Negative for suicidal ideas, hallucinations, behavioral problems, confusion, sleep disturbance, self-injury, dysphoric mood, decreased  concentration and agitation. The patient is not nervous/anxious.        Objective:   Physical Exam  Constitutional: He appears well-developed and well-nourished.  HENT:  Head: Normocephalic and atraumatic.  Right Ear: External ear normal.  Left Ear: External ear normal.  Nose: Nose normal.  Mouth/Throat: Oropharynx is clear and moist.  Hearing aids bilaterally Nonocclusive cerumen in both canals  Eyes: Conjunctivae and EOM are normal. Pupils are equal, round, and reactive to light. No scleral icterus.  Neck: Normal range of motion. Neck supple. No JVD present. No thyromegaly present.  Decreased left carotid upstroke but no bruit  Right supraclavicular bruit  Cardiovascular: Normal rate, normal heart sounds and intact distal pulses.  Exam reveals no gallop and no friction rub.   No murmur heard. Dorsalis pedis pulses intact.  Posterior tibial pulses difficult to palpate Decreased left carotid upstroke  Left femoral bruit  Pulmonary/Chest: Effort normal and breath sounds normal. He exhibits no tenderness.  Status post sternotomy  Abdominal: Soft. Bowel sounds are normal. He exhibits no distension and no mass. There is no tenderness.  Lower midline surgical scar  Genitourinary: Penis normal.  Musculoskeletal: Normal range of motion. He exhibits no edema or tenderness.  Lymphadenopathy:    He has no cervical adenopathy.  Neurological: He is alert. He has normal reflexes. No cranial nerve deficit. Coordination normal.  Decreased vibratory sensation lower extremities  Skin: Skin is warm and dry. No rash noted.  Onychomycotic nail changes of the toes  Psychiatric: He has a normal mood and affect. His behavior is normal.          Assessment & Plan:   Preventive health examination Hypothyroidism. TSH normal Coronary artery disease status post CABG Hypertension well controlled Dyslipidemia. Continue statin therapy Prostate cancer. Followup urology   Cardiology and urology  followup. Recheck here one year. Flu vaccine in the fall Laboratory profile reviewed and unremarkable except for increasing PSA

## 2014-12-25 NOTE — Progress Notes (Signed)
Pre visit review using our clinic review tool, if applicable. No additional management support is needed unless otherwise documented below in the visit note. 

## 2014-12-30 ENCOUNTER — Other Ambulatory Visit: Payer: Self-pay | Admitting: Internal Medicine

## 2015-02-13 ENCOUNTER — Other Ambulatory Visit: Payer: Self-pay | Admitting: Internal Medicine

## 2015-02-13 ENCOUNTER — Encounter (INDEPENDENT_AMBULATORY_CARE_PROVIDER_SITE_OTHER): Payer: Medicare Other | Admitting: Ophthalmology

## 2015-02-13 DIAGNOSIS — H3532 Exudative age-related macular degeneration: Secondary | ICD-10-CM | POA: Diagnosis not present

## 2015-02-13 DIAGNOSIS — H3531 Nonexudative age-related macular degeneration: Secondary | ICD-10-CM

## 2015-02-13 DIAGNOSIS — I10 Essential (primary) hypertension: Secondary | ICD-10-CM | POA: Diagnosis not present

## 2015-02-13 DIAGNOSIS — H35033 Hypertensive retinopathy, bilateral: Secondary | ICD-10-CM | POA: Diagnosis not present

## 2015-02-13 DIAGNOSIS — H43813 Vitreous degeneration, bilateral: Secondary | ICD-10-CM

## 2015-05-14 ENCOUNTER — Ambulatory Visit (INDEPENDENT_AMBULATORY_CARE_PROVIDER_SITE_OTHER): Payer: Medicare Other | Admitting: *Deleted

## 2015-05-14 DIAGNOSIS — Z23 Encounter for immunization: Secondary | ICD-10-CM

## 2015-06-26 ENCOUNTER — Encounter (INDEPENDENT_AMBULATORY_CARE_PROVIDER_SITE_OTHER): Payer: Medicare Other | Admitting: Ophthalmology

## 2015-06-26 DIAGNOSIS — H35033 Hypertensive retinopathy, bilateral: Secondary | ICD-10-CM

## 2015-06-26 DIAGNOSIS — H353221 Exudative age-related macular degeneration, left eye, with active choroidal neovascularization: Secondary | ICD-10-CM | POA: Diagnosis not present

## 2015-06-26 DIAGNOSIS — I10 Essential (primary) hypertension: Secondary | ICD-10-CM | POA: Diagnosis not present

## 2015-06-26 DIAGNOSIS — H353112 Nonexudative age-related macular degeneration, right eye, intermediate dry stage: Secondary | ICD-10-CM

## 2015-06-26 DIAGNOSIS — H43813 Vitreous degeneration, bilateral: Secondary | ICD-10-CM | POA: Diagnosis not present

## 2015-09-05 ENCOUNTER — Encounter (INDEPENDENT_AMBULATORY_CARE_PROVIDER_SITE_OTHER): Payer: Medicare Other | Admitting: Ophthalmology

## 2015-09-05 DIAGNOSIS — H353112 Nonexudative age-related macular degeneration, right eye, intermediate dry stage: Secondary | ICD-10-CM | POA: Diagnosis not present

## 2015-09-05 DIAGNOSIS — H353221 Exudative age-related macular degeneration, left eye, with active choroidal neovascularization: Secondary | ICD-10-CM | POA: Diagnosis not present

## 2015-09-05 DIAGNOSIS — I10 Essential (primary) hypertension: Secondary | ICD-10-CM | POA: Diagnosis not present

## 2015-09-05 DIAGNOSIS — H35033 Hypertensive retinopathy, bilateral: Secondary | ICD-10-CM

## 2015-09-05 DIAGNOSIS — H43813 Vitreous degeneration, bilateral: Secondary | ICD-10-CM

## 2015-10-02 DIAGNOSIS — H26491 Other secondary cataract, right eye: Secondary | ICD-10-CM | POA: Diagnosis not present

## 2015-10-03 ENCOUNTER — Encounter: Payer: Self-pay | Admitting: Internal Medicine

## 2015-10-03 ENCOUNTER — Ambulatory Visit (INDEPENDENT_AMBULATORY_CARE_PROVIDER_SITE_OTHER): Payer: Medicare Other | Admitting: Internal Medicine

## 2015-10-03 VITALS — BP 128/70 | HR 90 | Temp 97.9°F | Resp 20 | Ht 66.5 in | Wt 142.0 lb

## 2015-10-03 DIAGNOSIS — B9789 Other viral agents as the cause of diseases classified elsewhere: Secondary | ICD-10-CM

## 2015-10-03 DIAGNOSIS — J069 Acute upper respiratory infection, unspecified: Secondary | ICD-10-CM

## 2015-10-03 DIAGNOSIS — I10 Essential (primary) hypertension: Secondary | ICD-10-CM | POA: Diagnosis not present

## 2015-10-03 NOTE — Progress Notes (Signed)
Pre visit review using our clinic review tool, if applicable. No additional management support is needed unless otherwise documented below in the visit note. 

## 2015-10-03 NOTE — Progress Notes (Signed)
Subjective:    Patient ID: Raymond Swanson, male    DOB: 1925-04-05, 82 y.o.   MRN: [id number]  HPI  82 year old patient who presents with a one-week history of nonproductive cough associated with rhinorrhea.  He has been using Delsym, which she states presses cough well, but it still persists.  No fever, sputum production, shortness of breath, chest pain. He does have essential hypertension as well as dyslipidemia.  He has CAD, which has been stable.  Past Medical History  Diagnosis Date  . Cancer ([place])   . ACUT GASTR ULCER W/HEMORR W/O MENTION OBST 09/10/2009  . ANEMIA 10/08/2009  . CORONARY ARTERY DISEASE 01/26/2007  . HYPERLIPIDEMIA 01/26/2007  . HYPERTENSION 01/26/2007  . HYPOTHYROIDISM 10/18/2007  . MELENA 08/28/2009  . PROSTATE CANCER, HX OF 01/26/2007  . SKIN CANCER, HX OF 01/26/2007    Social History   Social History  . Marital Status: Married    Spouse Name: N/A  . Number of Children: N/A  . Years of Education: N/A   Occupational History  . Not on file.   Social History Main Topics  . Smoking status: Former Smoker -- 1.00 packs/day for 20 years    Types: Cigarettes    Quit date: 10/28/1948  . Smokeless tobacco: Never Used  . Alcohol Use: Yes  . Drug Use: No  . Sexual Activity: Not on file   Other Topics Concern  . Not on file   Social History Narrative    Past Surgical History  Procedure Laterality Date  . Coronary artery bypass graft      1991  . Hernia repair      ingunial  . Prostate surgery      prostatectomy  . Mohs surgery    . Cataract extraction      Family History  Problem Relation Age of Onset  . Lung cancer Brother 38  . GI problems Sister     No Known Allergies  Current Outpatient Prescriptions on File Prior to Visit  Medication Sig Dispense Refill  . aspirin 81 MG tablet Take 81 mg by mouth daily.      . calcium gluconate 500 MG tablet Take 500 mg by mouth daily.      . cholecalciferol (VITAMIN D) 1000 UNITS tablet Take 1,000 Units by  mouth daily.      . diphenhydramine-acetaminophen (TYLENOL PM) 25-500 MG TABS Take 1 tablet by mouth at bedtime as needed.      . fluorouracil (EFUDEX) 5 % cream Apply topically 2 (two) times daily. 40 g 2  . levothyroxine (SYNTHROID, LEVOTHROID) 50 MCG tablet take 1 tablet by mouth once daily 90 tablet 3  . Melatonin 5 MG TABS Take 2.5 mg by mouth at bedtime.    . Multiple Vitamin (MULTIVITAMIN) tablet Take 1 tablet by mouth daily.      . Multiple Vitamins-Minerals (PRESERVISION/LUTEIN PO) Take by mouth. Daily       . nitroGLYCERIN (NITROSTAT) 0.4 MG SL tablet Place 1 tablet (0.4 mg total) under the tongue every 5 (five) minutes as needed. 90 tablet 3  . Omega-3 Fatty Acids (OMEGA-3 FISH OIL PO) Take by mouth daily.    . simvastatin (ZOCOR) 20 MG tablet take 1 tablet by mouth once daily 90 tablet 3   No current facility-administered medications on file prior to visit.    BP 128/70 mmHg  Pulse 90  Temp(Src) 97.9 F (36.6 C) (Oral)  Resp 20  Ht 5' 6.5" (1.689 m)  Wt 142 lb (  64.411 kg)  BMI 22.58 kg/m2  SpO2 95%     Review of Systems  Constitutional: Negative for fever, chills, appetite change and fatigue.  HENT: Positive for postnasal drip. Negative for congestion, dental problem, ear pain, hearing loss, sore throat, tinnitus, trouble swallowing and voice change.   Eyes: Negative for pain, discharge and visual disturbance.  Respiratory: Positive for cough. Negative for chest tightness, wheezing and stridor.   Cardiovascular: Negative for chest pain, palpitations and leg swelling.  Gastrointestinal: Negative for nausea, vomiting, abdominal pain, diarrhea, constipation, blood in stool and abdominal distention.  Genitourinary: Negative for urgency, hematuria, flank pain, discharge, difficulty urinating and genital sores.  Musculoskeletal: Negative for myalgias, back pain, joint swelling, arthralgias, gait problem and neck stiffness.  Skin: Negative for rash.  Neurological: Negative  for dizziness, syncope, speech difficulty, weakness, numbness and headaches.  Hematological: Negative for adenopathy. Does not bruise/bleed easily.  Psychiatric/Behavioral: Negative for behavioral problems and dysphoric mood. The patient is not nervous/anxious.        Objective:   Physical Exam  Constitutional: He is oriented to person, place, and time. He appears well-developed.  HENT:  Head: Normocephalic.  Right Ear: External ear normal.  Left Ear: External ear normal.  Eyes: Conjunctivae and EOM are normal.  Neck: Normal range of motion.  Cardiovascular: Normal rate and normal heart sounds.   Occasional ectopics  Pulmonary/Chest: Breath sounds normal. No respiratory distress. He has no wheezes. He has no rales.  Abdominal: Bowel sounds are normal.  Musculoskeletal: Normal range of motion. He exhibits no edema or tenderness.  Neurological: He is alert and oriented to person, place, and time.  Psychiatric: He has a normal mood and affect. His behavior is normal.          Assessment & Plan:   Viral URI with cough.  Will treat symptomatically Hypertension, well-controlled

## 2015-10-03 NOTE — Patient Instructions (Signed)
Acute bronchitis symptoms for less than 10 days are generally not helped by antibiotics.  Take over-the-counter expectorants and cough medications such as  Mucinex DM.  Call if there is no improvement in 5 to 7 days or if  you develop worsening cough, fever, or new symptoms, such as shortness of breath or chest pain.  HOME CARE INSTRUCTIONS  Drink plenty of water. Water helps thin the mucus so your sinuses can drain more easily.  Use a humidifier.  Inhale steam 3-4 times a day (for example, sit in the bathroom with the shower running).  Apply a warm, moist washcloth to your face 3-4 times a day, or as directed by your health care provider.  Use saline nasal sprays to help moisten and clean your sinuses.  Take medicines only as directed by your health care provider.

## 2015-11-05 DIAGNOSIS — C61 Malignant neoplasm of prostate: Secondary | ICD-10-CM | POA: Diagnosis not present

## 2015-11-15 DIAGNOSIS — Z0000 Encounter for general adult medical examination without abnormal findings: Secondary | ICD-10-CM | POA: Diagnosis not present

## 2015-11-15 DIAGNOSIS — R972 Elevated prostate specific antigen [PSA]: Secondary | ICD-10-CM | POA: Diagnosis not present

## 2015-11-15 DIAGNOSIS — M889 Osteitis deformans of unspecified bone: Secondary | ICD-10-CM | POA: Diagnosis not present

## 2015-11-15 DIAGNOSIS — M545 Low back pain: Secondary | ICD-10-CM | POA: Diagnosis not present

## 2015-11-15 DIAGNOSIS — C61 Malignant neoplasm of prostate: Secondary | ICD-10-CM | POA: Diagnosis not present

## 2015-11-18 NOTE — Progress Notes (Signed)
HPI The patient presents for follow up of CAD.  Since I last saw him he has done well.  The patient denies any new symptoms such as chest discomfort, neck or arm discomfort. There has been no new  PND or orthopnea. There have been no reported palpitations, presyncope or syncope.   He still walks daily.  He walks his dog and does other activity.   He is slightly more SOB than he used to be but this is mild   No Known Allergies  Current Outpatient Prescriptions  Medication Sig Dispense Refill  . aspirin 81 MG tablet Take 81 mg by mouth daily.      . calcium gluconate 500 MG tablet Take 500 mg by mouth daily.      . cholecalciferol (VITAMIN D) 1000 UNITS tablet Take 1,000 Units by mouth daily.      . diphenhydramine-acetaminophen (TYLENOL PM) 25-500 MG TABS Take 1 tablet by mouth at bedtime as needed.      . fluorouracil (EFUDEX) 5 % cream Apply topically 2 (two) times daily. 40 g 2  . levothyroxine (SYNTHROID, LEVOTHROID) 50 MCG tablet take 1 tablet by mouth once daily 90 tablet 3  . Melatonin 5 MG TABS Take 2.5 mg by mouth at bedtime.    . Multiple Vitamin (MULTIVITAMIN) tablet Take 1 tablet by mouth daily.      . Multiple Vitamins-Minerals (PRESERVISION/LUTEIN PO) Take by mouth. Daily       . nitroGLYCERIN (NITROSTAT) 0.4 MG SL tablet Place 1 tablet (0.4 mg total) under the tongue every 5 (five) minutes as needed. 90 tablet 3  . Omega-3 Fatty Acids (OMEGA-3 FISH OIL PO) Take by mouth daily.    . simvastatin (ZOCOR) 20 MG tablet take 1 tablet by mouth once daily 90 tablet 3   No current facility-administered medications for this visit.    Past Medical History  Diagnosis Date  . Cancer ([place])   . ACUT GASTR ULCER W/HEMORR W/O MENTION OBST 09/10/2009  . ANEMIA 10/08/2009  . CORONARY ARTERY DISEASE 01/26/2007  . HYPERLIPIDEMIA 01/26/2007  . HYPERTENSION 01/26/2007  . HYPOTHYROIDISM 10/18/2007  . MELENA 08/28/2009  . PROSTATE CANCER, HX OF 01/26/2007  . SKIN CANCER, HX OF 01/26/2007    Past  Surgical History  Procedure Laterality Date  . Coronary artery bypass graft      1991  . Hernia repair      ingunial  . Prostate surgery      prostatectomy  . Mohs surgery    . Cataract extraction      ROS:  Hard of hearing.  Otherwise as here in stated in the HPI and negative for all other systems.  PHYSICAL EXAM BP 132/64 mmHg  Pulse 82  Ht 5\' 8"  (1.727 m)  Wt 137 lb (62.143 kg)  BMI 20.84 kg/m2 GENERAL:  Well appearing HEENT:  Pupils equal round and reactive, fundi not visualized, oral mucosa unremarkable NECK:  No jugular venous distention, waveform within normal limits, carotid upstroke brisk and symmetric, no bruits, no thyromegaly LUNGS:  Clear to auscultation bilaterally BACK:  No CVA tenderness CHEST:  Well healed sternotomy scar. HEART:  PMI not displaced or sustained,S1 and S2 within normal limits, no S3, no S4, no clicks, no rubs, no murmurs ABD:  Flat, positive bowel sounds normal in frequency in pitch, no bruits, no rebound, no guarding, no midline pulsatile mass, no hepatomegaly, no splenomegaly EXT:  2 plus pulses throughout, no edema, no cyanosis no clubbing   EKG:  Sinus  rhythm, rate 82, axis within normal limits, intervals within normal limits, no acute ST-T wave changes.  PVCs. 11/19/2015   ASSESSMENT AND PLAN  CORONARY ARTERY DISEASE -  He has had no new symptoms. He had a negative POET ([place] Exercise Treadmill) in 2015.  No change in therapy is planned.   HYPERTENSION -  The blood pressure is at target. No change in medications is indicated. We will continue with therapeutic lifestyle changes (TLC).  HYPERLIPIDEMIA -  He is due to have lipids drawn in April.  I will defer to Nyoka Cowden, MD.  His lipids last year were at goal.

## 2015-11-19 ENCOUNTER — Ambulatory Visit (INDEPENDENT_AMBULATORY_CARE_PROVIDER_SITE_OTHER): Payer: Medicare Other | Admitting: Cardiology

## 2015-11-19 ENCOUNTER — Encounter: Payer: Self-pay | Admitting: Cardiology

## 2015-11-19 VITALS — BP 132/64 | HR 82 | Ht 68.0 in | Wt 137.0 lb

## 2015-11-19 DIAGNOSIS — I2510 Atherosclerotic heart disease of native coronary artery without angina pectoris: Secondary | ICD-10-CM

## 2015-11-19 NOTE — Patient Instructions (Signed)
Your physician wants you to follow-up in: 1 Year. You will receive a reminder letter in the mail two months in advance. If you don't receive a letter, please call our office to schedule the follow-up appointment.  

## 2015-11-28 ENCOUNTER — Encounter (INDEPENDENT_AMBULATORY_CARE_PROVIDER_SITE_OTHER): Payer: Medicare Other | Admitting: Ophthalmology

## 2015-11-28 DIAGNOSIS — I10 Essential (primary) hypertension: Secondary | ICD-10-CM | POA: Diagnosis not present

## 2015-11-28 DIAGNOSIS — H353114 Nonexudative age-related macular degeneration, right eye, advanced atrophic with subfoveal involvement: Secondary | ICD-10-CM | POA: Diagnosis not present

## 2015-11-28 DIAGNOSIS — H43813 Vitreous degeneration, bilateral: Secondary | ICD-10-CM | POA: Diagnosis not present

## 2015-11-28 DIAGNOSIS — H35033 Hypertensive retinopathy, bilateral: Secondary | ICD-10-CM | POA: Diagnosis not present

## 2015-11-28 DIAGNOSIS — H353121 Nonexudative age-related macular degeneration, left eye, early dry stage: Secondary | ICD-10-CM | POA: Diagnosis not present

## 2015-12-24 ENCOUNTER — Other Ambulatory Visit (INDEPENDENT_AMBULATORY_CARE_PROVIDER_SITE_OTHER): Payer: Medicare Other

## 2015-12-24 DIAGNOSIS — Z0000 Encounter for general adult medical examination without abnormal findings: Secondary | ICD-10-CM | POA: Diagnosis not present

## 2015-12-24 LAB — LIPID PANEL
Cholesterol: 143 mg/dL (ref 0–200)
HDL: 54.8 mg/dL (ref >39.00)
LDL Cholesterol: 71 mg/dL (ref 0–99)
NONHDL: 87.73
TRIGLYCERIDES: 85 mg/dL (ref 0.0–149.0)
Total CHOL/HDL Ratio: 3
VLDL: 17 mg/dL (ref 0.0–40.0)

## 2015-12-24 LAB — BASIC METABOLIC PANEL
BUN: 21 mg/dL (ref 6–23)
CHLORIDE: 106 meq/L (ref 96–112)
CO2: 31 mEq/L (ref 19–32)
Calcium: 9.4 mg/dL (ref 8.4–10.5)
Creatinine, Ser: 1.03 mg/dL (ref 0.40–1.50)
GFR: 72.22 mL/min (ref >60.00)
Glucose, Bld: 99 mg/dL (ref 70–99)
Potassium: 4.5 mEq/L (ref 3.5–5.1)
SODIUM: 142 meq/L (ref 135–145)

## 2015-12-24 LAB — HEPATIC FUNCTION PANEL
ALBUMIN: 3.7 g/dL (ref 3.5–5.2)
ALT: 15 U/L (ref 0–53)
AST: 21 U/L (ref 0–37)
Alkaline Phosphatase: 70 U/L (ref 39–117)
Bilirubin, Direct: 0.2 mg/dL (ref 0.0–0.3)
TOTAL PROTEIN: 5.9 g/dL — AB (ref 6.0–8.3)
Total Bilirubin: 1.1 mg/dL (ref 0.2–1.2)

## 2015-12-24 LAB — POC URINALSYSI DIPSTICK (AUTOMATED)
Bilirubin, UA: NEGATIVE
GLUCOSE UA: NEGATIVE
Ketones, UA: NEGATIVE
Leukocytes, UA: NEGATIVE
NITRITE UA: NEGATIVE
PROTEIN UA: NEGATIVE
Spec Grav, UA: 1.02
UROBILINOGEN UA: 0.2
pH, UA: 5.5

## 2015-12-24 LAB — CBC WITH DIFFERENTIAL/PLATELET
BASOS PCT: 0.6 % (ref 0.0–3.0)
Basophils Absolute: 0 10*3/uL (ref 0.0–0.1)
EOS PCT: 2.5 % (ref 0.0–5.0)
Eosinophils Absolute: 0.1 10*3/uL (ref 0.0–0.7)
HEMATOCRIT: 42.5 % (ref 39.0–52.0)
Hemoglobin: 14.1 g/dL (ref 13.0–17.0)
LYMPHS PCT: 17.2 % (ref 12.0–46.0)
Lymphs Abs: 1 10*3/uL (ref 0.7–4.0)
MCHC: 33.3 g/dL (ref 30.0–36.0)
MCV: 91.3 fl (ref 78.0–100.0)
MONOS PCT: 10.8 % (ref 3.0–12.0)
Monocytes Absolute: 0.6 10*3/uL (ref 0.1–1.0)
NEUTROS ABS: 3.9 10*3/uL (ref 1.4–7.7)
Neutrophils Relative %: 68.9 % (ref 43.0–77.0)
PLATELETS: 188 10*3/uL (ref 150.0–400.0)
RBC: 4.65 Mil/uL (ref 4.22–5.81)
RDW: 15.5 % (ref 11.5–15.5)
WBC: 5.6 10*3/uL (ref 4.0–10.5)

## 2015-12-24 LAB — TSH: TSH: 4.64 u[IU]/mL — ABNORMAL HIGH (ref 0.35–4.50)

## 2015-12-31 ENCOUNTER — Ambulatory Visit (INDEPENDENT_AMBULATORY_CARE_PROVIDER_SITE_OTHER): Payer: Medicare Other | Admitting: Internal Medicine

## 2015-12-31 ENCOUNTER — Encounter: Payer: Self-pay | Admitting: Internal Medicine

## 2015-12-31 VITALS — BP 110/60 | HR 79 | Temp 97.5°F | Resp 18 | Ht 68.0 in | Wt 140.0 lb

## 2015-12-31 DIAGNOSIS — E785 Hyperlipidemia, unspecified: Secondary | ICD-10-CM | POA: Diagnosis not present

## 2015-12-31 DIAGNOSIS — D649 Anemia, unspecified: Secondary | ICD-10-CM

## 2015-12-31 DIAGNOSIS — E039 Hypothyroidism, unspecified: Secondary | ICD-10-CM

## 2015-12-31 DIAGNOSIS — Z8546 Personal history of malignant neoplasm of prostate: Secondary | ICD-10-CM

## 2015-12-31 DIAGNOSIS — Z0000 Encounter for general adult medical examination without abnormal findings: Secondary | ICD-10-CM | POA: Diagnosis not present

## 2015-12-31 DIAGNOSIS — I2510 Atherosclerotic heart disease of native coronary artery without angina pectoris: Secondary | ICD-10-CM

## 2015-12-31 DIAGNOSIS — I10 Essential (primary) hypertension: Secondary | ICD-10-CM

## 2015-12-31 MED ORDER — NITROGLYCERIN 0.4 MG SL SUBL: 0.4000 mg | SUBLINGUAL_TABLET | SUBLINGUAL | Status: DC | PRN

## 2015-12-31 MED ORDER — LEVOTHYROXINE SODIUM 50 MCG PO TABS: 50.0000 ug | ORAL_TABLET | Freq: Every day | ORAL | Status: DC

## 2015-12-31 MED ORDER — SIMVASTATIN 20 MG PO TABS: 20.0000 mg | ORAL_TABLET | Freq: Every day | ORAL | Status: DC

## 2015-12-31 NOTE — Progress Notes (Signed)
Subjective:    Patient ID: Raymond Swanson, male    DOB: 02-05-1925, 82 y.o.   MRN: [id number]  HPI  Patient ID: Raymond Swanson, male   DOB: 1925-06-07, 82 y.o.   MRN: [id number]  Subjective:    Patient ID: Raymond Swanson, male    DOB: 01/09/1925, 82 y.o.   MRN: [id number]  HPI 82   year-old patient who is seen today for a preventive health examination.  He has a history of coronary artery disease status post CABG in 1991. He is followed by annually by cardiology. He continues to do well and walks most days but has decreased his duration from 1 mile to 1 half mile daily. He denies any exertional chest pain;  he does describe periods of some weakness and poor exercise capacity. In general he does quite well.  He has had a recent stress test by cardiology (2015). He is status post prostatectomy and is followed by urology twice annually with a history of increasing PSAs.  He did have a PET scan performed in July 2015 that was unremarkable for metastatic disease. He denies any bone pain More recently he has been followed by ophthalmology due  to macular degeneration. He is followed by general ophthalmology as well as by a retinal specialist frequently   Alcohol-Tobacco  Smoking Status: never   Allergies (verified):  No Known Drug Allergies   Past History:  Past Medical History:   Prostate cancer, hx of- history of increasing PSAs  Skin cancer, hx of Mohs L ear  Coronary artery disease status post CABG (LIMA to  the LAD, SVG to the right coronary artery, and SVG to obtuse marginal in  1991)  Hyperlipidemia  Hypertension  Hypothyroidism  History of diplopia 2009  history of gastric ulcer, secondary to aspirin, January 2011  Macular degeneration  Past Surgical History:  Coronary artery bypass graft 91  Inguinal herniorrhaphy 94  Prostatectomy 1990  MOHS left ear 8/07, left cheek [facility] March 2010  Cataract extraction  Sigmoidoscopy 1999  Cardiolite stress test September 2007,  October 2010  2015 upper panendoscopy January 2011   Family History:   father died age 50, pancreatic cancer  mother died age 72  One brother died age 22 lung cancer  4 sisters   Social History:   Retired  Married  remote tobacco use  MWE  1. Risk factors, based on past  M,S,F history-  patient has known coronary artery disease. Cardiovascular risk factors include hypertension dyslipidemia remote tobacco use  2.  Physical activities: He remains quite active for 82 years of age. He does walk 1/2  mile most days of the week. He performs calisthenics and also does some balance-type exercises.  3.  Depression/mood: No history of depression or mood disorder  4.  Hearing: Wears hearing aids bilaterally 5.  ADL's: Independent in all aspects of daily living  6.  Fall risk: Mild to moderate do to  age  82.  Home safety: No problems identified  8.  Height weight, and visual acuity; height and weight stable no change in visual acuity. Wears glasses  9.  Counseling: Heart healthy diet regular exercise encouraged  10. Lab orders based on risk factors: Laboratory profile reviewed unremarkable except for a PSA of 20  11. Referral : Followup urology and cardiology  12. Care plan: Consider follow stress test in the fall. Bone scan scheduled for the near future  13. Cognitive assessment: Alert and oriented normal affect. No cognitive  dysfunction  14.  Preventive services will include an annual health examination.  He'll be seen by cardiology annually and urology.  Twice a year.  He will be monitored for prostate cancer.  Disease progression with appropriate imaging studies.  He will be followed closely by ophthalmology  15.  Provider list includes cardiology, primary care urology podiatry and ophthalmology  Past Medical History  Diagnosis Date  . Cancer ([place])   . ACUT GASTR ULCER W/HEMORR W/O MENTION OBST 09/10/2009  . ANEMIA 10/08/2009  . CORONARY ARTERY DISEASE 01/26/2007  .  HYPERLIPIDEMIA 01/26/2007  . HYPERTENSION 01/26/2007  . HYPOTHYROIDISM 10/18/2007  . MELENA 08/28/2009  . PROSTATE CANCER, HX OF 01/26/2007  . SKIN CANCER, HX OF 01/26/2007    Social History   Social History  . Marital Status: Married    Spouse Name: N/A  . Number of Children: N/A  . Years of Education: N/A   Occupational History  . Not on file.   Social History Main Topics  . Smoking status: Former Smoker -- 1.00 packs/day for 20 years    Types: Cigarettes    Quit date: 10/28/1948  . Smokeless tobacco: Never Used  . Alcohol Use: Yes  . Drug Use: No  . Sexual Activity: Not on file   Other Topics Concern  . Not on file   Social History Narrative    Past Surgical History  Procedure Laterality Date  . Coronary artery bypass graft      1991  . Hernia repair      ingunial  . Prostate surgery      prostatectomy  . Mohs surgery    . Cataract extraction      Family History  Problem Relation Age of Onset  . Lung cancer Brother 3  . GI problems Sister     No Known Allergies  Current Outpatient Prescriptions on File Prior to Visit  Medication Sig Dispense Refill  . aspirin 81 MG tablet Take 81 mg by mouth daily.      . calcium gluconate 500 MG tablet Take 500 mg by mouth daily.      . cholecalciferol (VITAMIN D) 1000 UNITS tablet Take 1,000 Units by mouth daily.      . diphenhydramine-acetaminophen (TYLENOL PM) 25-500 MG TABS Take 1 tablet by mouth at bedtime as needed.      . fluorouracil (EFUDEX) 5 % cream Apply topically 2 (two) times daily. 40 g 2  . Melatonin 5 MG TABS Take 2.5 mg by mouth at bedtime.    . Multiple Vitamin (MULTIVITAMIN) tablet Take 1 tablet by mouth daily.      . Multiple Vitamins-Minerals (PRESERVISION/LUTEIN PO) Take by mouth. Daily       . Omega-3 Fatty Acids (OMEGA-3 FISH OIL PO) Take by mouth daily.     No current facility-administered medications on file prior to visit.    BP 110/60 mmHg  Pulse 79  Temp(Src) 97.5 F (36.4 C) (Oral)   Resp 18  Ht 5\' 8"  (1.727 m)  Wt 140 lb (63.504 kg)  BMI 21.29 kg/m2  SpO2 98%        Review of Systems  Constitutional: Negative for fever, chills, activity change, appetite change and fatigue.  HENT: Negative for congestion, dental problem, ear pain, hearing loss, mouth sores, rhinorrhea, sinus pressure, sneezing, tinnitus, trouble swallowing and voice change.   Eyes: Negative for photophobia, pain, redness and visual disturbance.  Respiratory: Negative for apnea, cough, choking, chest tightness, shortness of breath and wheezing.  Cardiovascular: Negative for chest pain, palpitations and leg swelling.  Gastrointestinal: Negative for nausea, vomiting, abdominal pain, diarrhea, constipation, blood in stool, abdominal distention, anal bleeding and rectal pain.  Genitourinary: Negative for dysuria, urgency, frequency, hematuria, flank pain, decreased urine volume, discharge, penile swelling, scrotal swelling, difficulty urinating, genital sores and testicular pain.  Musculoskeletal: Negative for myalgias, back pain, joint swelling, arthralgias, gait problem, neck pain and neck stiffness.  Skin: Negative for color change, rash and wound.  Neurological: Positive for weakness. Negative for dizziness, tremors, seizures, syncope, facial asymmetry, speech difficulty, light-headedness, numbness and headaches.  Hematological: Negative for adenopathy. Does not bruise/bleed easily.  Psychiatric/Behavioral: Negative for suicidal ideas, hallucinations, behavioral problems, confusion, sleep disturbance, self-injury, dysphoric mood, decreased concentration and agitation. The patient is not nervous/anxious.        Objective:   Physical Exam  Constitutional: He appears well-developed and well-nourished.  HENT:  Head: Normocephalic and atraumatic.  Right Ear: External ear normal.  Left Ear: External ear normal.  Nose: Nose normal.  Mouth/Throat: Oropharynx is clear and moist.  Hearing aids  bilaterally Nonocclusive cerumen in both canals  Eyes: Conjunctivae and EOM are normal. Pupils are equal, round, and reactive to light. No scleral icterus.  Neck: Normal range of motion. Neck supple. No JVD present. No thyromegaly present.  Decreased left carotid upstroke but no bruit  Right supraclavicular bruit  Cardiovascular: Normal rate, normal heart sounds and intact distal pulses.  Exam reveals no gallop and no friction rub.  Occasional irregularity No murmur heard. Dorsalis pedis pulses intact.  Posterior tibial pulses difficult to palpate Decreased left carotid upstroke  Bilateral femoral bruits Pulmonary/Chest: Effort normal and breath sounds normal. He exhibits no tenderness.  Status post sternotomy surgical scar right breast Abdominal: Soft. Bowel sounds are normal. He exhibits no distension and no mass. There is no tenderness.  Lower midline surgical scar  Genitourinary: Penis normal.  Musculoskeletal: Normal range of motion. He exhibits no edema or tenderness.  Lymphadenopathy:    He has no cervical adenopathy.  Neurological: He is alert. He has normal reflexes. No cranial nerve deficit. Coordination normal.  Decreased vibratory sensation lower extremities  Skin: Skin is warm and dry. No rash noted.  Onychomycotic nail changes of the toes  Psychiatric: He has a normal mood and affect. His behavior is normal.          Assessment & Plan:   Preventive health examination Hypothyroidism. TSH normal Coronary artery disease status post CABG Hypertension well controlled Dyslipidemia. Continue statin therapy Prostate cancer. Followup urology   Cardiology and urology followup. Recheck here one year. Flu vaccine in the fall Laboratory profile reviewed and unremarkable except for increasing PSA  Review of Systems As above    Objective:   Physical Exam  As above      Assessment & Plan:   Preventive health Coronary artery disease, stable Essential  hypertension, well-controlled Dyslipidemia.  Continue statin therapy History of anemia Prostate cancer.  Follow-up urology History of macular degeneration.  Follow-up ophthalmology  Return here one year or as needed

## 2015-12-31 NOTE — Progress Notes (Signed)
Pre visit review using our clinic review tool, if applicable. No additional management support is needed unless otherwise documented below in the visit note. 

## 2015-12-31 NOTE — Patient Instructions (Signed)
Limit your sodium (Salt) intake    It is important that you exercise regularly, at least 20 minutes 3 to 4 times per week.  If you develop chest pain or shortness of breath seek  medical attention.  Take a calcium supplement, plus [phone] units of vitamin D  Cardiology, ophthalmology and urology follow-up as scheduled  Return here in one year or as needed

## 2016-01-22 ENCOUNTER — Emergency Department (HOSPITAL_COMMUNITY): Payer: Medicare Other

## 2016-01-22 ENCOUNTER — Inpatient Hospital Stay (HOSPITAL_COMMUNITY)
Admission: EM | Admit: 2016-01-22 | Discharge: 2016-01-28 | DRG: 083 | Disposition: A | Discharge status: Rehabilitation Facility | Payer: Medicare Other | Attending: General Surgery | Admitting: General Surgery

## 2016-01-22 DIAGNOSIS — R402411 Glasgow coma scale score 13-15, in the field [EMT or ambulance]: Secondary | ICD-10-CM | POA: Diagnosis not present

## 2016-01-22 DIAGNOSIS — S2232XA Fracture of one rib, left side, initial encounter for closed fracture: Secondary | ICD-10-CM

## 2016-01-22 DIAGNOSIS — S2232XS Fracture of one rib, left side, sequela: Secondary | ICD-10-CM | POA: Diagnosis not present

## 2016-01-22 DIAGNOSIS — Z9079 Acquired absence of other genital organ(s): Secondary | ICD-10-CM

## 2016-01-22 DIAGNOSIS — I2510 Atherosclerotic heart disease of native coronary artery without angina pectoris: Secondary | ICD-10-CM | POA: Diagnosis present

## 2016-01-22 DIAGNOSIS — C61 Malignant neoplasm of prostate: Secondary | ICD-10-CM | POA: Diagnosis not present

## 2016-01-22 DIAGNOSIS — Z951 Presence of aortocoronary bypass graft: Secondary | ICD-10-CM | POA: Diagnosis not present

## 2016-01-22 DIAGNOSIS — K5900 Constipation, unspecified: Secondary | ICD-10-CM | POA: Diagnosis not present

## 2016-01-22 DIAGNOSIS — M4856XA Collapsed vertebra, not elsewhere classified, lumbar region, initial encounter for fracture: Secondary | ICD-10-CM | POA: Diagnosis not present

## 2016-01-22 DIAGNOSIS — D649 Anemia, unspecified: Secondary | ICD-10-CM | POA: Diagnosis not present

## 2016-01-22 DIAGNOSIS — M4854XA Collapsed vertebra, not elsewhere classified, thoracic region, initial encounter for fracture: Secondary | ICD-10-CM | POA: Diagnosis not present

## 2016-01-22 DIAGNOSIS — Z7982 Long term (current) use of aspirin: Secondary | ICD-10-CM

## 2016-01-22 DIAGNOSIS — S066X9A Traumatic subarachnoid hemorrhage with loss of consciousness of unspecified duration, initial encounter: Secondary | ICD-10-CM | POA: Diagnosis not present

## 2016-01-22 DIAGNOSIS — Z9849 Cataract extraction status, unspecified eye: Secondary | ICD-10-CM | POA: Diagnosis not present

## 2016-01-22 DIAGNOSIS — S32018S Other fracture of first lumbar vertebra, sequela: Secondary | ICD-10-CM | POA: Diagnosis not present

## 2016-01-22 DIAGNOSIS — S066X0A Traumatic subarachnoid hemorrhage without loss of consciousness, initial encounter: Secondary | ICD-10-CM | POA: Diagnosis not present

## 2016-01-22 DIAGNOSIS — Z79899 Other long term (current) drug therapy: Secondary | ICD-10-CM

## 2016-01-22 DIAGNOSIS — S065X0A Traumatic subdural hemorrhage without loss of consciousness, initial encounter: Secondary | ICD-10-CM | POA: Diagnosis not present

## 2016-01-22 DIAGNOSIS — S2249XA Multiple fractures of ribs, unspecified side, initial encounter for closed fracture: Secondary | ICD-10-CM

## 2016-01-22 DIAGNOSIS — S32009A Unspecified fracture of unspecified lumbar vertebra, initial encounter for closed fracture: Secondary | ICD-10-CM | POA: Diagnosis present

## 2016-01-22 DIAGNOSIS — E039 Hypothyroidism, unspecified: Secondary | ICD-10-CM | POA: Diagnosis present

## 2016-01-22 DIAGNOSIS — S2242XS Multiple fractures of ribs, left side, sequela: Secondary | ICD-10-CM | POA: Diagnosis not present

## 2016-01-22 DIAGNOSIS — S32018A Other fracture of first lumbar vertebra, initial encounter for closed fracture: Secondary | ICD-10-CM | POA: Diagnosis not present

## 2016-01-22 DIAGNOSIS — S22069D Unspecified fracture of T7-T8 vertebra, subsequent encounter for fracture with routine healing: Secondary | ICD-10-CM | POA: Diagnosis not present

## 2016-01-22 DIAGNOSIS — S199XXA Unspecified injury of neck, initial encounter: Secondary | ICD-10-CM | POA: Diagnosis not present

## 2016-01-22 DIAGNOSIS — S2242XA Multiple fractures of ribs, left side, initial encounter for closed fracture: Secondary | ICD-10-CM | POA: Diagnosis not present

## 2016-01-22 DIAGNOSIS — I609 Nontraumatic subarachnoid hemorrhage, unspecified: Secondary | ICD-10-CM | POA: Diagnosis present

## 2016-01-22 DIAGNOSIS — Z974 Presence of external hearing-aid: Secondary | ICD-10-CM | POA: Diagnosis not present

## 2016-01-22 DIAGNOSIS — E785 Hyperlipidemia, unspecified: Secondary | ICD-10-CM | POA: Diagnosis not present

## 2016-01-22 DIAGNOSIS — E860 Dehydration: Secondary | ICD-10-CM | POA: Diagnosis not present

## 2016-01-22 DIAGNOSIS — Z87891 Personal history of nicotine dependence: Secondary | ICD-10-CM

## 2016-01-22 DIAGNOSIS — Z85828 Personal history of other malignant neoplasm of skin: Secondary | ICD-10-CM

## 2016-01-22 DIAGNOSIS — L8990 Pressure ulcer of unspecified site, unspecified stage: Secondary | ICD-10-CM | POA: Diagnosis not present

## 2016-01-22 DIAGNOSIS — H9193 Unspecified hearing loss, bilateral: Secondary | ICD-10-CM | POA: Diagnosis present

## 2016-01-22 DIAGNOSIS — W11XXXA Fall on and from ladder, initial encounter: Secondary | ICD-10-CM | POA: Diagnosis present

## 2016-01-22 DIAGNOSIS — S32019A Unspecified fracture of first lumbar vertebra, initial encounter for closed fracture: Secondary | ICD-10-CM | POA: Diagnosis present

## 2016-01-22 DIAGNOSIS — S065X9A Traumatic subdural hemorrhage with loss of consciousness of unspecified duration, initial encounter: Secondary | ICD-10-CM | POA: Diagnosis present

## 2016-01-22 DIAGNOSIS — W19XXXA Unspecified fall, initial encounter: Secondary | ICD-10-CM | POA: Diagnosis present

## 2016-01-22 DIAGNOSIS — D72829 Elevated white blood cell count, unspecified: Secondary | ICD-10-CM | POA: Diagnosis not present

## 2016-01-22 DIAGNOSIS — S020XXD Fracture of vault of skull, subsequent encounter for fracture with routine healing: Secondary | ICD-10-CM | POA: Diagnosis not present

## 2016-01-22 DIAGNOSIS — S0219XA Other fracture of base of skull, initial encounter for closed fracture: Secondary | ICD-10-CM | POA: Diagnosis not present

## 2016-01-22 DIAGNOSIS — S32019S Unspecified fracture of first lumbar vertebra, sequela: Secondary | ICD-10-CM | POA: Diagnosis not present

## 2016-01-22 DIAGNOSIS — I10 Essential (primary) hypertension: Secondary | ICD-10-CM | POA: Diagnosis not present

## 2016-01-22 DIAGNOSIS — S065X9S Traumatic subdural hemorrhage with loss of consciousness of unspecified duration, sequela: Secondary | ICD-10-CM | POA: Diagnosis not present

## 2016-01-22 DIAGNOSIS — S065X1A Traumatic subdural hemorrhage with loss of consciousness of 30 minutes or less, initial encounter: Secondary | ICD-10-CM | POA: Diagnosis present

## 2016-01-22 DIAGNOSIS — Z8546 Personal history of malignant neoplasm of prostate: Secondary | ICD-10-CM | POA: Diagnosis not present

## 2016-01-22 DIAGNOSIS — S3991XA Unspecified injury of abdomen, initial encounter: Secondary | ICD-10-CM | POA: Diagnosis not present

## 2016-01-22 DIAGNOSIS — S065X1D Traumatic subdural hemorrhage with loss of consciousness of 30 minutes or less, subsequent encounter: Secondary | ICD-10-CM | POA: Diagnosis not present

## 2016-01-22 DIAGNOSIS — S065X1S Traumatic subdural hemorrhage with loss of consciousness of 30 minutes or less, sequela: Secondary | ICD-10-CM | POA: Diagnosis not present

## 2016-01-22 DIAGNOSIS — S0003XA Contusion of scalp, initial encounter: Secondary | ICD-10-CM | POA: Diagnosis not present

## 2016-01-22 DIAGNOSIS — S066X9D Traumatic subarachnoid hemorrhage with loss of consciousness of unspecified duration, subsequent encounter: Secondary | ICD-10-CM | POA: Diagnosis not present

## 2016-01-22 DIAGNOSIS — S22069A Unspecified fracture of T7-T8 vertebra, initial encounter for closed fracture: Secondary | ICD-10-CM | POA: Diagnosis present

## 2016-01-22 LAB — I-STAT TROPONIN, ED: TROPONIN I, POC: 0.01 ng/mL (ref 0.00–0.08)

## 2016-01-22 LAB — COMPREHENSIVE METABOLIC PANEL
ALBUMIN: 3.5 g/dL (ref 3.5–5.0)
ALT: 24 U/L (ref 17–63)
ANION GAP: 6 (ref 5–15)
AST: 31 U/L (ref 15–41)
Alkaline Phosphatase: 70 U/L (ref 38–126)
BILIRUBIN TOTAL: 0.6 mg/dL (ref 0.3–1.2)
BUN: 20 mg/dL (ref 6–20)
CALCIUM: 9.4 mg/dL (ref 8.9–10.3)
CO2: 28 mmol/L (ref 22–32)
Chloride: 107 mmol/L (ref 101–111)
Creatinine, Ser: 1.19 mg/dL (ref 0.61–1.24)
GFR, EST NON AFRICAN AMERICAN: 52 mL/min — AB (ref >60)
Glucose, Bld: 149 mg/dL — ABNORMAL HIGH (ref 65–99)
POTASSIUM: 4.3 mmol/L (ref 3.5–5.1)
Sodium: 141 mmol/L (ref 135–145)
TOTAL PROTEIN: 5.9 g/dL — AB (ref 6.5–8.1)

## 2016-01-22 LAB — CBC WITH DIFFERENTIAL/PLATELET
BASOS ABS: 0 10*3/uL (ref 0.0–0.1)
BASOS PCT: 0 %
Eosinophils Absolute: 0 10*3/uL (ref 0.0–0.7)
Eosinophils Relative: 0 %
HEMATOCRIT: 41.3 % (ref 39.0–52.0)
Hemoglobin: 13.1 g/dL (ref 13.0–17.0)
LYMPHS PCT: 4 %
Lymphs Abs: 0.6 10*3/uL — ABNORMAL LOW (ref 0.7–4.0)
MCH: 29.4 pg (ref 26.0–34.0)
MCHC: 31.7 g/dL (ref 30.0–36.0)
MCV: 92.8 fL (ref 78.0–100.0)
Monocytes Absolute: 1.2 10*3/uL — ABNORMAL HIGH (ref 0.1–1.0)
Monocytes Relative: 7 %
NEUTROS ABS: 14.9 10*3/uL — AB (ref 1.7–7.7)
Neutrophils Relative %: 89 %
PLATELETS: 153 10*3/uL (ref 150–400)
RBC: 4.45 MIL/uL (ref 4.22–5.81)
RDW: 14 % (ref 11.5–15.5)
WBC: 16.7 10*3/uL — AB (ref 4.0–10.5)

## 2016-01-22 LAB — TYPE AND SCREEN
ABO/RH(D): O POS
Antibody Screen: NEGATIVE

## 2016-01-22 LAB — I-STAT CHEM 8, ED
BUN: 24 mg/dL — ABNORMAL HIGH (ref 6–20)
CHLORIDE: 103 mmol/L (ref 101–111)
CREATININE: 1.1 mg/dL (ref 0.61–1.24)
Calcium, Ion: 1.18 mmol/L (ref 1.13–1.30)
GLUCOSE: 144 mg/dL — AB (ref 65–99)
HEMATOCRIT: 42 % (ref 39.0–52.0)
HEMOGLOBIN: 14.3 g/dL (ref 13.0–17.0)
POTASSIUM: 4.4 mmol/L (ref 3.5–5.1)
Sodium: 143 mmol/L (ref 135–145)
TCO2: 25 mmol/L (ref 0–100)

## 2016-01-22 LAB — ABO/RH: ABO/RH(D): O POS

## 2016-01-22 LAB — PROTIME-INR
INR: 1.07 (ref 0.00–1.49)
PROTHROMBIN TIME: 14.1 s (ref 11.6–15.2)

## 2016-01-22 MED ORDER — DOCUSATE SODIUM 100 MG PO CAPS
100.0000 mg | ORAL_CAPSULE | Freq: Two times a day (BID) | ORAL | Status: DC
  Administered 2016-01-23 – 2016-01-28 (×11): 100 mg via ORAL
  Filled 2016-01-22 (×11): qty 1

## 2016-01-22 MED ORDER — MORPHINE SULFATE (PF) 2 MG/ML IV SOLN
2.0000 mg | INTRAVENOUS | Status: DC | PRN
  Administered 2016-01-23: 2 mg via INTRAVENOUS
  Filled 2016-01-22: qty 1

## 2016-01-22 MED ORDER — NITROGLYCERIN 0.4 MG SL SUBL: 0.4000 mg | SUBLINGUAL_TABLET | SUBLINGUAL | Status: DC | PRN

## 2016-01-22 MED ORDER — ACETAMINOPHEN 325 MG PO TABS: 650.0000 mg | ORAL_TABLET | ORAL | Status: DC | PRN

## 2016-01-22 MED ORDER — LEVOTHYROXINE SODIUM 50 MCG PO TABS
50.0000 ug | ORAL_TABLET | Freq: Every day | ORAL | Status: DC
  Administered 2016-01-24 – 2016-01-28 (×5): 50 ug via ORAL
  Filled 2016-01-22 (×5): qty 1

## 2016-01-22 MED ORDER — IOPAMIDOL (ISOVUE-300) INJECTION 61%
INTRAVENOUS | Status: AC
  Administered 2016-01-22: 100 mL
  Filled 2016-01-22: qty 100

## 2016-01-22 MED ORDER — KCL IN DEXTROSE-NACL 20-5-0.45 MEQ/L-%-% IV SOLN
INTRAVENOUS | Status: DC
  Administered 2016-01-23 – 2016-01-25 (×3): via INTRAVENOUS
  Filled 2016-01-22 (×8): qty 1000

## 2016-01-22 MED ORDER — ONDANSETRON HCL 4 MG PO TABS: 4.0000 mg | ORAL_TABLET | Freq: Four times a day (QID) | ORAL | Status: DC | PRN

## 2016-01-22 MED ORDER — ONDANSETRON HCL 4 MG/2ML IJ SOLN: 4.0000 mg | Freq: Four times a day (QID) | INTRAMUSCULAR | Status: DC | PRN

## 2016-01-22 NOTE — ED Notes (Signed)
Surgeon at bedside.  

## 2016-01-22 NOTE — H&P (Signed)
History   Raymond Swanson is an 82 y.o. male.   Chief Complaint:  Chief Complaint  Patient presents with  . Head Injury    Head Injury Associated symptoms: hearing loss and loss of consciousness   Associated symptoms: no blurred vision, no double vision, no nausea, no neck pain and no tinnitus   Trauma Mechanism of injury: fall Injury location: head/neck and torso Injury location detail: head and L flank Incident location: home Time since incident: 6 hours Arrived directly from scene: no   Fall:      Fall occurred: from a ladder      Height of fall: 3 ft      Impact surface: dirt      Entrapped after fall: no      Suspicion of alcohol use: no      Suspicion of drug use: no  EMS/PTA data:      Ambulatory at scene: no      Responsiveness: alert      Loss of consciousness: yes      Amnesic to event: yes      Airway interventions: none      Breathing interventions: none      IV access: none      IO access: none      Cardiac interventions: none      Medications administered: none      Immobilization: C-collar      Airway condition since incident: stable      Breathing condition since incident: stable      Circulation condition since incident: stable      Mental status condition since incident: stable      Disability condition since incident: stable  Current symptoms:      Pain scale: 4/10      Pain quality: stabbing      Pain timing: constant      Associated symptoms:            Reports chest pain, hearing loss and loss of consciousness.            Denies abdominal pain, back pain, nausea and neck pain.   Relevant PMH:      Medical risk factors:            CABG.       Pharmacological risk factors:            Antiplatelet therapy.    Past Medical History  Diagnosis Date  . Cancer ([place])   . ACUT GASTR ULCER W/HEMORR W/O MENTION OBST 09/10/2009  . ANEMIA 10/08/2009  . CORONARY ARTERY DISEASE 01/26/2007  . HYPERLIPIDEMIA 01/26/2007  . HYPERTENSION 01/26/2007  .  HYPOTHYROIDISM 10/18/2007  . MELENA 08/28/2009  . PROSTATE CANCER, HX OF 01/26/2007  . SKIN CANCER, HX OF 01/26/2007    Past Surgical History  Procedure Laterality Date  . Coronary artery bypass graft      1991  . Hernia repair      ingunial  . Prostate surgery      prostatectomy  . Mohs surgery    . Cataract extraction      Family History  Problem Relation Age of Onset  . Lung cancer Brother 7  . GI problems Sister    Social History:  reports that he quit smoking about 67 years ago. His smoking use included Cigarettes. He has a 20 pack-year smoking history. He has never used smokeless tobacco. He reports that he drinks alcohol. He reports that he does not use illicit  drugs.  Allergies  No Known Allergies  Home Medications   (Not in a hospital admission)  Trauma Course   Results for orders placed or performed during the hospital encounter of 01/22/16 (from the past 48 hour(s))  Comprehensive metabolic panel     Status: Abnormal   Collection Time: 01/22/16  6:11 PM  Result Value Ref Range   Sodium 141 135 - 145 mmol/L   Potassium 4.3 3.5 - 5.1 mmol/L   Chloride 107 101 - 111 mmol/L   CO2 28 22 - 32 mmol/L   Glucose, Bld 149 (H) 65 - 99 mg/dL   BUN 20 6 - 20 mg/dL   Creatinine, Ser 1.19 0.61 - 1.24 mg/dL   Calcium 9.4 8.9 - 10.3 mg/dL   Total Protein 5.9 (L) 6.5 - 8.1 g/dL   Albumin 3.5 3.5 - 5.0 g/dL   AST 31 15 - 41 U/L   ALT 24 17 - 63 U/L   Alkaline Phosphatase 70 38 - 126 U/L   Total Bilirubin 0.6 0.3 - 1.2 mg/dL   GFR calc non Af Amer 52 (L) >60 mL/min   GFR calc Af Amer >60 >60 mL/min    Comment: (NOTE) The eGFR has been calculated using the CKD EPI equation. This calculation has not been validated in all clinical situations. eGFR's persistently <60 mL/min signify possible Chronic Kidney Disease.    Anion gap 6 5 - 15  CBC with Differential     Status: Abnormal   Collection Time: 01/22/16  6:11 PM  Result Value Ref Range   WBC 16.7 (H) 4.0 - 10.5 K/uL    RBC 4.45 4.22 - 5.81 MIL/uL   Hemoglobin 13.1 13.0 - 17.0 g/dL   HCT 41.3 39.0 - 52.0 %   MCV 92.8 78.0 - 100.0 fL   MCH 29.4 26.0 - 34.0 pg   MCHC 31.7 30.0 - 36.0 g/dL   RDW 14.0 11.5 - 15.5 %   Platelets 153 150 - 400 K/uL   Neutrophils Relative % 89 %   Neutro Abs 14.9 (H) 1.7 - 7.7 K/uL   Lymphocytes Relative 4 %   Lymphs Abs 0.6 (L) 0.7 - 4.0 K/uL   Monocytes Relative 7 %   Monocytes Absolute 1.2 (H) 0.1 - 1.0 K/uL   Eosinophils Relative 0 %   Eosinophils Absolute 0.0 0.0 - 0.7 K/uL   Basophils Relative 0 %   Basophils Absolute 0.0 0.0 - 0.1 K/uL  Protime-INR     Status: None   Collection Time: 01/22/16  6:11 PM  Result Value Ref Range   Prothrombin Time 14.1 11.6 - 15.2 seconds   INR 1.07 0.00 - 1.49  I-stat troponin, ED     Status: None   Collection Time: 01/22/16  6:22 PM  Result Value Ref Range   Troponin i, poc 0.01 0.00 - 0.08 ng/mL   Comment 3            Comment: Due to the release kinetics of cTnI, a negative result within the first hours of the onset of symptoms does not rule out myocardial infarction with certainty. If myocardial infarction is still suspected, repeat the test at appropriate intervals.   I-stat Chem 8, ED     Status: Abnormal   Collection Time: 01/22/16  6:24 PM  Result Value Ref Range   Sodium 143 135 - 145 mmol/L   Potassium 4.4 3.5 - 5.1 mmol/L   Chloride 103 101 - 111 mmol/L   BUN 24 (H) 6 -  20 mg/dL   Creatinine, Ser 1.10 0.61 - 1.24 mg/dL   Glucose, Bld 144 (H) 65 - 99 mg/dL   Calcium, Ion 1.18 1.13 - 1.30 mmol/L   TCO2 25 0 - 100 mmol/L   Hemoglobin 14.3 13.0 - 17.0 g/dL   HCT 42.0 39.0 - 52.0 %  Type and screen [place]     Status: None   Collection Time: 01/22/16  7:40 PM  Result Value Ref Range   ABO/RH(D) O POS    Antibody Screen NEG    Sample Expiration 01/25/2016    Ct Head Wo Contrast  01/22/2016  CLINICAL DATA:  Fall from ladder with head injury. Right forehead and posterior head laceration.  Prostate cancer. EXAM: CT HEAD WITHOUT CONTRAST CT CERVICAL SPINE WITHOUT CONTRAST TECHNIQUE: Multidetector CT imaging of the head and cervical spine was performed following the standard protocol without intravenous contrast. Multiplanar CT image reconstructions of the cervical spine were also generated. COMPARISON:  03/12/2014 PET-CT. FINDINGS: CT HEAD FINDINGS Moderate left parietal scalp contusion. Acute right frontotemporal convexity subdural hematoma measuring up to 4 mm in thickness along the right anterior falx. Acute subarachnoid hemorrhage throughout the right cerebral sulci, predominantly right inferior frontal and temporoparietal. Hemorrhagic cortical contusions/subarachnoid hemorrhage in the bilateral anterior frontal lobes. Probable small amount of acute subarachnoid hemorrhage in the interpeduncular cistern. No mass lesion. No midline shift. Basilar cisterns remain patent. Intracranial atherosclerosis. Nonspecific mild subcortical and periventricular white matter hypodensity, most in keeping with chronic small vessel ischemic change. Cerebral volume is age appropriate. No ventriculomegaly. The visualized paranasal sinuses are essentially clear. The mastoid air cells are unopacified. No evidence of calvarial fracture. CT CERVICAL SPINE FINDINGS No fracture is detected in the cervical spine. No prevertebral soft tissue swelling. There is mild straightening of the cervical spine, usually due to positioning and/or muscle spasm. Dens is well positioned between the lateral masses of C1. The lateral masses appear well-aligned. Moderate degenerative disc disease in the mid to lower cervical spine, most prominent at C5-6. Mild-to-moderate bilateral facet arthropathy. No significant cervical foraminal stenosis. No cervical spine subluxation. Visualized mastoid air cells appear clear. No evidence of intra-axial hemorrhage in the visualized brain. No gross cervical canal hematoma. Centrilobular emphysema at the  lung apices. No significant pulmonary nodules at the visualized lung apices. No cervical adenopathy or other significant neck soft tissue abnormality. IMPRESSION: 1. Moderate left parietal scalp contusion. 2. Acute right frontotemporal convexity subdural hematoma. 3. Acute subarachnoid hemorrhage throughout the right cerebral sulci. 4. Acute hemorrhagic cortical contusions/subarachnoid hemorrhage in the bilateral anterior frontal lobes. 5. No midline shift. Basilar cisterns remain patent. No ventriculomegaly. 6. Mild chronic small vessel ischemia. 7. No cervical spine fracture or subluxation. 8. Moderate cervical spine degenerative changes. Critical Value/emergent results were called by telephone at the time of interpretation on 01/22/2016 at 7:52 pm to Dr. Quintella Reichert , who verbally acknowledged these results. Electronically Signed   By: Ilona Sorrel M.D.   On: 01/22/2016 19:53   Ct Chest W Contrast  01/22/2016  CLINICAL DATA:  Fall with head hematoma. EXAM: CT CHEST, ABDOMEN, AND PELVIS WITH CONTRAST TECHNIQUE: Multidetector CT imaging of the chest, abdomen and pelvis was performed following the standard protocol during bolus administration of intravenous contrast. CONTRAST:  100 mL Isovue-300 COMPARISON:  03/20/2014 FINDINGS: CT CHEST FINDINGS Mediastinum/Lymph Nodes: The thoracic aorta is heavily calcified without aneurysm. Flow in the great vessels. No evidence for a mediastinal hematoma. Coronary artery calcifications. Small hiatal hernia. Lungs/Pleura: Trachea and  mainstem bronchi are patent. Motion artifact. Lucency of the upper lungs and cannot exclude emphysema. Small air-filled collections along the medial left upper lobe adjacent to the descending thoracic aorta. These are new from the prior CT. Question a tiny nodular structure in the left upper lobe on sequence 5, image 30 which is probably stable. There is some atelectasis at both lung bases. Stable punctate nodule along the left major fissure on  sequence 5, image 32. One or two small nodular densities in the superior segment of left lower lobe are new on sequence 5, image 31. Largest nodular density roughly measures 5 mm. Musculoskeletal: Fractures of left sixth, seventh, eighth, ninth and tenth ribs. No definite pneumothorax. Both shoulders are located. Visualized clavicles are intact. Prior median sternotomy. No large pneumothorax. There is a compression fracture involving the T8 vertebral body. There is an anterior wedge deformity of the T8 vertebral body with approximately 60% loss of height along the anterior aspect. CT ABDOMEN PELVIS FINDINGS Hepatobiliary: Motion artifact on this exam limits evaluation. Small low-density structures in the liver could represent small cysts. There is high-density material in the gallbladder suggestive for stones. No significant biliary dilatation. Portal venous system appears to be patent. Pancreas: No gross abnormality to the pancreas. Spleen: No gross abnormality to the spleen. No evidence for perisplenic fluid. Adrenals/Urinary Tract: Mild fullness of left adrenal gland is probably stable. No gross abnormality to the right adrenal gland. Exophytic left renal cyst. Normal appearance of the urinary bladder. Mild fullness of the renal collecting systems bilaterally. Stomach/Bowel: Small hiatal hernia. There is no significant dilatation of the bowel structures and no evidence for a bowel obstruction. There is mesenteric edema in the left lower abdomen on sequence 4, image 87. This is a difficult area to evaluate due to motion artifact throughout the study. There may also be a small amount of mesenteric edema in the right lower abdomen. Vascular/Lymphatic: Vascular structures are heavily calcified. The infrarenal abdominal aorta measures up to 2.6 cm. No suspicious abdominal or pelvic lymphadenopathy. Reproductive: No gross abnormality to the prostate. Other: No evidence for free air. There is edema or a small amount of  fluid in the left abdominal mesentery. Musculoskeletal: Again noted is heterogeneity of the in the left ilium related to Paget's disease and this is similar to the prior examination. There is a fracture of the left L4 transverse process. Fracture of the left eleventh rib. There is also a compression deformity involving the L1 vertebral body which could be old. Marked disc space loss at L5-S1. Bilateral pars defects at L5. Minimal anterolisthesis at L5-S1. IMPRESSION: Multiple left posterior rib fractures (6 through 11) without a pneumothorax. New small air-filled collections along the medial left upper lobe could represent traumatic pneumatoceles. Few new parenchymal nodular densities in left lower lobe are nonspecific but could be also related to recent trauma. Evaluation of the intra-abdominal structures is limited due to motion artifact but there is evidence for mesenteric edema and fluid, particularly in the left abdomen and left lower quadrant. Findings raise concern for a mesenteric injury. There is no evidence for free air. Fracture of the L4 left transverse process. Compression fractures involving T8 and L1. Suspect that the T8 fracture is acute but these are age-indeterminate. These results were called by telephone at the time of interpretation on 01/22/2016 at 8:01 pm to Dr. Quintella Reichert , who verbally acknowledged these results. Electronically Signed   By: Markus Daft M.D.   On: 01/22/2016 20:05   Ct  Cervical Spine Wo Contrast  01/22/2016  CLINICAL DATA:  Fall from ladder with head injury. Right forehead and posterior head laceration. Prostate cancer. EXAM: CT HEAD WITHOUT CONTRAST CT CERVICAL SPINE WITHOUT CONTRAST TECHNIQUE: Multidetector CT imaging of the head and cervical spine was performed following the standard protocol without intravenous contrast. Multiplanar CT image reconstructions of the cervical spine were also generated. COMPARISON:  03/12/2014 PET-CT. FINDINGS: CT HEAD FINDINGS Moderate  left parietal scalp contusion. Acute right frontotemporal convexity subdural hematoma measuring up to 4 mm in thickness along the right anterior falx. Acute subarachnoid hemorrhage throughout the right cerebral sulci, predominantly right inferior frontal and temporoparietal. Hemorrhagic cortical contusions/subarachnoid hemorrhage in the bilateral anterior frontal lobes. Probable small amount of acute subarachnoid hemorrhage in the interpeduncular cistern. No mass lesion. No midline shift. Basilar cisterns remain patent. Intracranial atherosclerosis. Nonspecific mild subcortical and periventricular white matter hypodensity, most in keeping with chronic small vessel ischemic change. Cerebral volume is age appropriate. No ventriculomegaly. The visualized paranasal sinuses are essentially clear. The mastoid air cells are unopacified. No evidence of calvarial fracture. CT CERVICAL SPINE FINDINGS No fracture is detected in the cervical spine. No prevertebral soft tissue swelling. There is mild straightening of the cervical spine, usually due to positioning and/or muscle spasm. Dens is well positioned between the lateral masses of C1. The lateral masses appear well-aligned. Moderate degenerative disc disease in the mid to lower cervical spine, most prominent at C5-6. Mild-to-moderate bilateral facet arthropathy. No significant cervical foraminal stenosis. No cervical spine subluxation. Visualized mastoid air cells appear clear. No evidence of intra-axial hemorrhage in the visualized brain. No gross cervical canal hematoma. Centrilobular emphysema at the lung apices. No significant pulmonary nodules at the visualized lung apices. No cervical adenopathy or other significant neck soft tissue abnormality. IMPRESSION: 1. Moderate left parietal scalp contusion. 2. Acute right frontotemporal convexity subdural hematoma. 3. Acute subarachnoid hemorrhage throughout the right cerebral sulci. 4. Acute hemorrhagic cortical  contusions/subarachnoid hemorrhage in the bilateral anterior frontal lobes. 5. No midline shift. Basilar cisterns remain patent. No ventriculomegaly. 6. Mild chronic small vessel ischemia. 7. No cervical spine fracture or subluxation. 8. Moderate cervical spine degenerative changes. Critical Value/emergent results were called by telephone at the time of interpretation on 01/22/2016 at 7:52 pm to Dr. Quintella Reichert , who verbally acknowledged these results. Electronically Signed   By: Ilona Sorrel M.D.   On: 01/22/2016 19:53   Ct Abdomen Pelvis W Contrast  01/22/2016  CLINICAL DATA:  Fall with head hematoma. EXAM: CT CHEST, ABDOMEN, AND PELVIS WITH CONTRAST TECHNIQUE: Multidetector CT imaging of the chest, abdomen and pelvis was performed following the standard protocol during bolus administration of intravenous contrast. CONTRAST:  100 mL Isovue-300 COMPARISON:  03/20/2014 FINDINGS: CT CHEST FINDINGS Mediastinum/Lymph Nodes: The thoracic aorta is heavily calcified without aneurysm. Flow in the great vessels. No evidence for a mediastinal hematoma. Coronary artery calcifications. Small hiatal hernia. Lungs/Pleura: Trachea and mainstem bronchi are patent. Motion artifact. Lucency of the upper lungs and cannot exclude emphysema. Small air-filled collections along the medial left upper lobe adjacent to the descending thoracic aorta. These are new from the prior CT. Question a tiny nodular structure in the left upper lobe on sequence 5, image 30 which is probably stable. There is some atelectasis at both lung bases. Stable punctate nodule along the left major fissure on sequence 5, image 32. One or two small nodular densities in the superior segment of left lower lobe are new on sequence 5, image 31. Largest nodular  density roughly measures 5 mm. Musculoskeletal: Fractures of left sixth, seventh, eighth, ninth and tenth ribs. No definite pneumothorax. Both shoulders are located. Visualized clavicles are intact. Prior  median sternotomy. No large pneumothorax. There is a compression fracture involving the T8 vertebral body. There is an anterior wedge deformity of the T8 vertebral body with approximately 60% loss of height along the anterior aspect. CT ABDOMEN PELVIS FINDINGS Hepatobiliary: Motion artifact on this exam limits evaluation. Small low-density structures in the liver could represent small cysts. There is high-density material in the gallbladder suggestive for stones. No significant biliary dilatation. Portal venous system appears to be patent. Pancreas: No gross abnormality to the pancreas. Spleen: No gross abnormality to the spleen. No evidence for perisplenic fluid. Adrenals/Urinary Tract: Mild fullness of left adrenal gland is probably stable. No gross abnormality to the right adrenal gland. Exophytic left renal cyst. Normal appearance of the urinary bladder. Mild fullness of the renal collecting systems bilaterally. Stomach/Bowel: Small hiatal hernia. There is no significant dilatation of the bowel structures and no evidence for a bowel obstruction. There is mesenteric edema in the left lower abdomen on sequence 4, image 87. This is a difficult area to evaluate due to motion artifact throughout the study. There may also be a small amount of mesenteric edema in the right lower abdomen. Vascular/Lymphatic: Vascular structures are heavily calcified. The infrarenal abdominal aorta measures up to 2.6 cm. No suspicious abdominal or pelvic lymphadenopathy. Reproductive: No gross abnormality to the prostate. Other: No evidence for free air. There is edema or a small amount of fluid in the left abdominal mesentery. Musculoskeletal: Again noted is heterogeneity of the in the left ilium related to Paget's disease and this is similar to the prior examination. There is a fracture of the left L4 transverse process. Fracture of the left eleventh rib. There is also a compression deformity involving the L1 vertebral body which could  be old. Marked disc space loss at L5-S1. Bilateral pars defects at L5. Minimal anterolisthesis at L5-S1. IMPRESSION: Multiple left posterior rib fractures (6 through 11) without a pneumothorax. New small air-filled collections along the medial left upper lobe could represent traumatic pneumatoceles. Few new parenchymal nodular densities in left lower lobe are nonspecific but could be also related to recent trauma. Evaluation of the intra-abdominal structures is limited due to motion artifact but there is evidence for mesenteric edema and fluid, particularly in the left abdomen and left lower quadrant. Findings raise concern for a mesenteric injury. There is no evidence for free air. Fracture of the L4 left transverse process. Compression fractures involving T8 and L1. Suspect that the T8 fracture is acute but these are age-indeterminate. These results were called by telephone at the time of interpretation on 01/22/2016 at 8:01 pm to Dr. Quintella Reichert , who verbally acknowledged these results. Electronically Signed   By: Markus Daft M.D.   On: 01/22/2016 20:05   Dg Chest Port 1 View  01/22/2016  CLINICAL DATA:  Fall from ladder with head injury. EXAM: PORTABLE CHEST 1 VIEW COMPARISON:  08/28/2009 chest radiograph FINDINGS: Sternotomy wires appear aligned and intact. Stable cardiomediastinal silhouette with normal heart size. No pneumothorax. No pleural effusion. Bibasilar atelectasis. No pulmonary edema. Minimally displaced posterior left seventh and eighth rib fractures, which appear acute. IMPRESSION: 1. Minimally displaced acute posterior left seventh and eighth rib fractures. No pneumothorax . 2. Bibasilar atelectasis. Electronically Signed   By: Ilona Sorrel M.D.   On: 01/22/2016 18:30    Review of Systems  Constitutional: Negative for fever and chills.  HENT: Positive for hearing loss. Negative for ear discharge, ear pain and tinnitus.   Eyes: Negative for blurred vision and double vision.   Respiratory: Negative for cough, hemoptysis and shortness of breath.   Cardiovascular: Positive for chest pain. Negative for palpitations and orthopnea.  Gastrointestinal: Negative for heartburn, nausea and abdominal pain.  Musculoskeletal: Positive for falls. Negative for back pain, joint pain and neck pain.  Skin: Negative for itching and rash.  Neurological: Positive for loss of consciousness. Negative for dizziness and tingling.  Psychiatric/Behavioral: Negative for depression and suicidal ideas.    Blood pressure 117/66, pulse 99, temperature 97.2 F (36.2 C), temperature source Oral, resp. rate 17, SpO2 95 %. Physical Exam  Vitals reviewed. Constitutional: He is oriented to person, place, and time. He appears well-developed and well-nourished.  HENT:  Head: Normocephalic.  Multiple abrasions over scalp  Eyes: Conjunctivae and EOM are normal. Pupils are equal, round, and reactive to light.  Neck:  In collar due to injury and amnesia  Cardiovascular: Normal rate and regular rhythm.   Respiratory: Effort normal and breath sounds normal.  Tender over left lower chest  GI: Soft. Bowel sounds are normal. He exhibits no distension. There is no tenderness.  Musculoskeletal: Normal range of motion.  Neurological: He is alert and oriented to person, place, and time.  Skin: Skin is warm and dry.  Psychiatric: He has a normal mood and affect. His behavior is normal.     Assessment/Plan 82 yo male with SDH and multiple rib fractures from fall while cleaning gutters. -admit to ICU -NSG on board -pain control and pulmonary toilet -serial neuro checks  Arta Bruce Kinsinger 01/22/2016, 9:55 PM   Procedures

## 2016-01-22 NOTE — Consult Note (Signed)
Reason for Consult: Closed head injury traumatic subarachnoid hemorrhage contusions Referring Physician: Emergency department  Raymond Swanson is an 82 y.o. male.  HPI: Patient is a 60-year-old gentleman who reportedly was cleaning some gutters and then muscle had a fall but the patient is completely amnestic of the event. Apparently was down for about 3 or 4 hours before he was found. He denies any significant headache only pain he has his left side denies any significant nausea and denies any neck pain.  Past Medical History  Diagnosis Date  . Cancer ([place])   . ACUT GASTR ULCER W/HEMORR W/O MENTION OBST 09/10/2009  . ANEMIA 10/08/2009  . CORONARY ARTERY DISEASE 01/26/2007  . HYPERLIPIDEMIA 01/26/2007  . HYPERTENSION 01/26/2007  . HYPOTHYROIDISM 10/18/2007  . MELENA 08/28/2009  . PROSTATE CANCER, HX OF 01/26/2007  . SKIN CANCER, HX OF 01/26/2007    Past Surgical History  Procedure Laterality Date  . Coronary artery bypass graft      1991  . Hernia repair      ingunial  . Prostate surgery      prostatectomy  . Mohs surgery    . Cataract extraction      Family History  Problem Relation Age of Onset  . Lung cancer Brother 28  . GI problems Sister     Social History:  reports that he quit smoking about 67 years ago. His smoking use included Cigarettes. He has a 20 pack-year smoking history. He has never used smokeless tobacco. He reports that he drinks alcohol. He reports that he does not use illicit drugs.  Allergies: No Known Allergies  Medications: I have reviewed the patient's current medications.  Results for orders placed or performed during the hospital encounter of 01/22/16 (from the past 48 hour(s))  Comprehensive metabolic panel     Status: Abnormal   Collection Time: 01/22/16  6:11 PM  Result Value Ref Range   Sodium 141 135 - 145 mmol/L   Potassium 4.3 3.5 - 5.1 mmol/L   Chloride 107 101 - 111 mmol/L   CO2 28 22 - 32 mmol/L   Glucose, Bld 149 (H) 65 - 99 mg/dL   BUN 20 6 -  20 mg/dL   Creatinine, Ser 1.19 0.61 - 1.24 mg/dL   Calcium 9.4 8.9 - 10.3 mg/dL   Total Protein 5.9 (L) 6.5 - 8.1 g/dL   Albumin 3.5 3.5 - 5.0 g/dL   AST 31 15 - 41 U/L   ALT 24 17 - 63 U/L   Alkaline Phosphatase 70 38 - 126 U/L   Total Bilirubin 0.6 0.3 - 1.2 mg/dL   GFR calc non Af Amer 52 (L) >60 mL/min   GFR calc Af Amer >60 >60 mL/min    Comment: (NOTE) The eGFR has been calculated using the CKD EPI equation. This calculation has not been validated in all clinical situations. eGFR's persistently <60 mL/min signify possible Chronic Kidney Disease.    Anion gap 6 5 - 15  CBC with Differential     Status: Abnormal   Collection Time: 01/22/16  6:11 PM  Result Value Ref Range   WBC 16.7 (H) 4.0 - 10.5 K/uL   RBC 4.45 4.22 - 5.81 MIL/uL   Hemoglobin 13.1 13.0 - 17.0 g/dL   HCT 41.3 39.0 - 52.0 %   MCV 92.8 78.0 - 100.0 fL   MCH 29.4 26.0 - 34.0 pg   MCHC 31.7 30.0 - 36.0 g/dL   RDW 14.0 11.5 - 15.5 %  Platelets 153 150 - 400 K/uL   Neutrophils Relative % 89 %   Neutro Abs 14.9 (H) 1.7 - 7.7 K/uL   Lymphocytes Relative 4 %   Lymphs Abs 0.6 (L) 0.7 - 4.0 K/uL   Monocytes Relative 7 %   Monocytes Absolute 1.2 (H) 0.1 - 1.0 K/uL   Eosinophils Relative 0 %   Eosinophils Absolute 0.0 0.0 - 0.7 K/uL   Basophils Relative 0 %   Basophils Absolute 0.0 0.0 - 0.1 K/uL  Protime-INR     Status: None   Collection Time: 01/22/16  6:11 PM  Result Value Ref Range   Prothrombin Time 14.1 11.6 - 15.2 seconds   INR 1.07 0.00 - 1.49  I-stat troponin, ED     Status: None   Collection Time: 01/22/16  6:22 PM  Result Value Ref Range   Troponin i, poc 0.01 0.00 - 0.08 ng/mL   Comment 3            Comment: Due to the release kinetics of cTnI, a negative result within the first hours of the onset of symptoms does not rule out myocardial infarction with certainty. If myocardial infarction is still suspected, repeat the test at appropriate intervals.   I-stat Chem 8, ED     Status:  Abnormal   Collection Time: 01/22/16  6:24 PM  Result Value Ref Range   Sodium 143 135 - 145 mmol/L   Potassium 4.4 3.5 - 5.1 mmol/L   Chloride 103 101 - 111 mmol/L   BUN 24 (H) 6 - 20 mg/dL   Creatinine, Ser 1.10 0.61 - 1.24 mg/dL   Glucose, Bld 144 (H) 65 - 99 mg/dL   Calcium, Ion 1.18 1.13 - 1.30 mmol/L   TCO2 25 0 - 100 mmol/L   Hemoglobin 14.3 13.0 - 17.0 g/dL   HCT 42.0 39.0 - 52.0 %  Type and screen [place]     Status: None (Preliminary result)   Collection Time: 01/22/16  7:07 PM  Result Value Ref Range   ABO/RH(D) O POS    Antibody Screen PENDING    Sample Expiration 01/25/2016     Ct Head Wo Contrast  01/22/2016  CLINICAL DATA:  Fall from ladder with head injury. Right forehead and posterior head laceration. Prostate cancer. EXAM: CT HEAD WITHOUT CONTRAST CT CERVICAL SPINE WITHOUT CONTRAST TECHNIQUE: Multidetector CT imaging of the head and cervical spine was performed following the standard protocol without intravenous contrast. Multiplanar CT image reconstructions of the cervical spine were also generated. COMPARISON:  03/12/2014 PET-CT. FINDINGS: CT HEAD FINDINGS Moderate left parietal scalp contusion. Acute right frontotemporal convexity subdural hematoma measuring up to 4 mm in thickness along the right anterior falx. Acute subarachnoid hemorrhage throughout the right cerebral sulci, predominantly right inferior frontal and temporoparietal. Hemorrhagic cortical contusions/subarachnoid hemorrhage in the bilateral anterior frontal lobes. Probable small amount of acute subarachnoid hemorrhage in the interpeduncular cistern. No mass lesion. No midline shift. Basilar cisterns remain patent. Intracranial atherosclerosis. Nonspecific mild subcortical and periventricular white matter hypodensity, most in keeping with chronic small vessel ischemic change. Cerebral volume is age appropriate. No ventriculomegaly. The visualized paranasal sinuses are essentially clear.  The mastoid air cells are unopacified. No evidence of calvarial fracture. CT CERVICAL SPINE FINDINGS No fracture is detected in the cervical spine. No prevertebral soft tissue swelling. There is mild straightening of the cervical spine, usually due to positioning and/or muscle spasm. Dens is well positioned between the lateral masses of C1. The lateral  masses appear well-aligned. Moderate degenerative disc disease in the mid to lower cervical spine, most prominent at C5-6. Mild-to-moderate bilateral facet arthropathy. No significant cervical foraminal stenosis. No cervical spine subluxation. Visualized mastoid air cells appear clear. No evidence of intra-axial hemorrhage in the visualized brain. No gross cervical canal hematoma. Centrilobular emphysema at the lung apices. No significant pulmonary nodules at the visualized lung apices. No cervical adenopathy or other significant neck soft tissue abnormality. IMPRESSION: 1. Moderate left parietal scalp contusion. 2. Acute right frontotemporal convexity subdural hematoma. 3. Acute subarachnoid hemorrhage throughout the right cerebral sulci. 4. Acute hemorrhagic cortical contusions/subarachnoid hemorrhage in the bilateral anterior frontal lobes. 5. No midline shift. Basilar cisterns remain patent. No ventriculomegaly. 6. Mild chronic small vessel ischemia. 7. No cervical spine fracture or subluxation. 8. Moderate cervical spine degenerative changes. Critical Value/emergent results were called by telephone at the time of interpretation on 01/22/2016 at 7:52 pm to Dr. Quintella Reichert , who verbally acknowledged these results. Electronically Signed   By: Ilona Sorrel M.D.   On: 01/22/2016 19:53   Ct Chest W Contrast  01/22/2016  CLINICAL DATA:  Fall with head hematoma. EXAM: CT CHEST, ABDOMEN, AND PELVIS WITH CONTRAST TECHNIQUE: Multidetector CT imaging of the chest, abdomen and pelvis was performed following the standard protocol during bolus administration of  intravenous contrast. CONTRAST:  100 mL Isovue-300 COMPARISON:  03/20/2014 FINDINGS: CT CHEST FINDINGS Mediastinum/Lymph Nodes: The thoracic aorta is heavily calcified without aneurysm. Flow in the great vessels. No evidence for a mediastinal hematoma. Coronary artery calcifications. Small hiatal hernia. Lungs/Pleura: Trachea and mainstem bronchi are patent. Motion artifact. Lucency of the upper lungs and cannot exclude emphysema. Small air-filled collections along the medial left upper lobe adjacent to the descending thoracic aorta. These are new from the prior CT. Question a tiny nodular structure in the left upper lobe on sequence 5, image 30 which is probably stable. There is some atelectasis at both lung bases. Stable punctate nodule along the left major fissure on sequence 5, image 32. One or two small nodular densities in the superior segment of left lower lobe are new on sequence 5, image 31. Largest nodular density roughly measures 5 mm. Musculoskeletal: Fractures of left sixth, seventh, eighth, ninth and tenth ribs. No definite pneumothorax. Both shoulders are located. Visualized clavicles are intact. Prior median sternotomy. No large pneumothorax. There is a compression fracture involving the T8 vertebral body. There is an anterior wedge deformity of the T8 vertebral body with approximately 60% loss of height along the anterior aspect. CT ABDOMEN PELVIS FINDINGS Hepatobiliary: Motion artifact on this exam limits evaluation. Small low-density structures in the liver could represent small cysts. There is high-density material in the gallbladder suggestive for stones. No significant biliary dilatation. Portal venous system appears to be patent. Pancreas: No gross abnormality to the pancreas. Spleen: No gross abnormality to the spleen. No evidence for perisplenic fluid. Adrenals/Urinary Tract: Mild fullness of left adrenal gland is probably stable. No gross abnormality to the right adrenal gland. Exophytic  left renal cyst. Normal appearance of the urinary bladder. Mild fullness of the renal collecting systems bilaterally. Stomach/Bowel: Small hiatal hernia. There is no significant dilatation of the bowel structures and no evidence for a bowel obstruction. There is mesenteric edema in the left lower abdomen on sequence 4, image 87. This is a difficult area to evaluate due to motion artifact throughout the study. There may also be a small amount of mesenteric edema in the right lower abdomen. Vascular/Lymphatic: Vascular structures  are heavily calcified. The infrarenal abdominal aorta measures up to 2.6 cm. No suspicious abdominal or pelvic lymphadenopathy. Reproductive: No gross abnormality to the prostate. Other: No evidence for free air. There is edema or a small amount of fluid in the left abdominal mesentery. Musculoskeletal: Again noted is heterogeneity of the in the left ilium related to Paget's disease and this is similar to the prior examination. There is a fracture of the left L4 transverse process. Fracture of the left eleventh rib. There is also a compression deformity involving the L1 vertebral body which could be old. Marked disc space loss at L5-S1. Bilateral pars defects at L5. Minimal anterolisthesis at L5-S1. IMPRESSION: Multiple left posterior rib fractures (6 through 11) without a pneumothorax. New small air-filled collections along the medial left upper lobe could represent traumatic pneumatoceles. Few new parenchymal nodular densities in left lower lobe are nonspecific but could be also related to recent trauma. Evaluation of the intra-abdominal structures is limited due to motion artifact but there is evidence for mesenteric edema and fluid, particularly in the left abdomen and left lower quadrant. Findings raise concern for a mesenteric injury. There is no evidence for free air. Fracture of the L4 left transverse process. Compression fractures involving T8 and L1. Suspect that the T8 fracture is  acute but these are age-indeterminate. These results were called by telephone at the time of interpretation on 01/22/2016 at 8:01 pm to Dr. Quintella Reichert , who verbally acknowledged these results. Electronically Signed   By: Markus Daft M.D.   On: 01/22/2016 20:05   Ct Cervical Spine Wo Contrast  01/22/2016  CLINICAL DATA:  Fall from ladder with head injury. Right forehead and posterior head laceration. Prostate cancer. EXAM: CT HEAD WITHOUT CONTRAST CT CERVICAL SPINE WITHOUT CONTRAST TECHNIQUE: Multidetector CT imaging of the head and cervical spine was performed following the standard protocol without intravenous contrast. Multiplanar CT image reconstructions of the cervical spine were also generated. COMPARISON:  03/12/2014 PET-CT. FINDINGS: CT HEAD FINDINGS Moderate left parietal scalp contusion. Acute right frontotemporal convexity subdural hematoma measuring up to 4 mm in thickness along the right anterior falx. Acute subarachnoid hemorrhage throughout the right cerebral sulci, predominantly right inferior frontal and temporoparietal. Hemorrhagic cortical contusions/subarachnoid hemorrhage in the bilateral anterior frontal lobes. Probable small amount of acute subarachnoid hemorrhage in the interpeduncular cistern. No mass lesion. No midline shift. Basilar cisterns remain patent. Intracranial atherosclerosis. Nonspecific mild subcortical and periventricular white matter hypodensity, most in keeping with chronic small vessel ischemic change. Cerebral volume is age appropriate. No ventriculomegaly. The visualized paranasal sinuses are essentially clear. The mastoid air cells are unopacified. No evidence of calvarial fracture. CT CERVICAL SPINE FINDINGS No fracture is detected in the cervical spine. No prevertebral soft tissue swelling. There is mild straightening of the cervical spine, usually due to positioning and/or muscle spasm. Dens is well positioned between the lateral masses of C1. The lateral masses  appear well-aligned. Moderate degenerative disc disease in the mid to lower cervical spine, most prominent at C5-6. Mild-to-moderate bilateral facet arthropathy. No significant cervical foraminal stenosis. No cervical spine subluxation. Visualized mastoid air cells appear clear. No evidence of intra-axial hemorrhage in the visualized brain. No gross cervical canal hematoma. Centrilobular emphysema at the lung apices. No significant pulmonary nodules at the visualized lung apices. No cervical adenopathy or other significant neck soft tissue abnormality. IMPRESSION: 1. Moderate left parietal scalp contusion. 2. Acute right frontotemporal convexity subdural hematoma. 3. Acute subarachnoid hemorrhage throughout the right cerebral sulci. 4. Acute  hemorrhagic cortical contusions/subarachnoid hemorrhage in the bilateral anterior frontal lobes. 5. No midline shift. Basilar cisterns remain patent. No ventriculomegaly. 6. Mild chronic small vessel ischemia. 7. No cervical spine fracture or subluxation. 8. Moderate cervical spine degenerative changes. Critical Value/emergent results were called by telephone at the time of interpretation on 01/22/2016 at 7:52 pm to Dr. Quintella Reichert , who verbally acknowledged these results. Electronically Signed   By: Ilona Sorrel M.D.   On: 01/22/2016 19:53   Ct Abdomen Pelvis W Contrast  01/22/2016  CLINICAL DATA:  Fall with head hematoma. EXAM: CT CHEST, ABDOMEN, AND PELVIS WITH CONTRAST TECHNIQUE: Multidetector CT imaging of the chest, abdomen and pelvis was performed following the standard protocol during bolus administration of intravenous contrast. CONTRAST:  100 mL Isovue-300 COMPARISON:  03/20/2014 FINDINGS: CT CHEST FINDINGS Mediastinum/Lymph Nodes: The thoracic aorta is heavily calcified without aneurysm. Flow in the great vessels. No evidence for a mediastinal hematoma. Coronary artery calcifications. Small hiatal hernia. Lungs/Pleura: Trachea and mainstem bronchi are patent.  Motion artifact. Lucency of the upper lungs and cannot exclude emphysema. Small air-filled collections along the medial left upper lobe adjacent to the descending thoracic aorta. These are new from the prior CT. Question a tiny nodular structure in the left upper lobe on sequence 5, image 30 which is probably stable. There is some atelectasis at both lung bases. Stable punctate nodule along the left major fissure on sequence 5, image 32. One or two small nodular densities in the superior segment of left lower lobe are new on sequence 5, image 31. Largest nodular density roughly measures 5 mm. Musculoskeletal: Fractures of left sixth, seventh, eighth, ninth and tenth ribs. No definite pneumothorax. Both shoulders are located. Visualized clavicles are intact. Prior median sternotomy. No large pneumothorax. There is a compression fracture involving the T8 vertebral body. There is an anterior wedge deformity of the T8 vertebral body with approximately 60% loss of height along the anterior aspect. CT ABDOMEN PELVIS FINDINGS Hepatobiliary: Motion artifact on this exam limits evaluation. Small low-density structures in the liver could represent small cysts. There is high-density material in the gallbladder suggestive for stones. No significant biliary dilatation. Portal venous system appears to be patent. Pancreas: No gross abnormality to the pancreas. Spleen: No gross abnormality to the spleen. No evidence for perisplenic fluid. Adrenals/Urinary Tract: Mild fullness of left adrenal gland is probably stable. No gross abnormality to the right adrenal gland. Exophytic left renal cyst. Normal appearance of the urinary bladder. Mild fullness of the renal collecting systems bilaterally. Stomach/Bowel: Small hiatal hernia. There is no significant dilatation of the bowel structures and no evidence for a bowel obstruction. There is mesenteric edema in the left lower abdomen on sequence 4, image 87. This is a difficult area to  evaluate due to motion artifact throughout the study. There may also be a small amount of mesenteric edema in the right lower abdomen. Vascular/Lymphatic: Vascular structures are heavily calcified. The infrarenal abdominal aorta measures up to 2.6 cm. No suspicious abdominal or pelvic lymphadenopathy. Reproductive: No gross abnormality to the prostate. Other: No evidence for free air. There is edema or a small amount of fluid in the left abdominal mesentery. Musculoskeletal: Again noted is heterogeneity of the in the left ilium related to Paget's disease and this is similar to the prior examination. There is a fracture of the left L4 transverse process. Fracture of the left eleventh rib. There is also a compression deformity involving the L1 vertebral body which could be old. Marked  disc space loss at L5-S1. Bilateral pars defects at L5. Minimal anterolisthesis at L5-S1. IMPRESSION: Multiple left posterior rib fractures (6 through 11) without a pneumothorax. New small air-filled collections along the medial left upper lobe could represent traumatic pneumatoceles. Few new parenchymal nodular densities in left lower lobe are nonspecific but could be also related to recent trauma. Evaluation of the intra-abdominal structures is limited due to motion artifact but there is evidence for mesenteric edema and fluid, particularly in the left abdomen and left lower quadrant. Findings raise concern for a mesenteric injury. There is no evidence for free air. Fracture of the L4 left transverse process. Compression fractures involving T8 and L1. Suspect that the T8 fracture is acute but these are age-indeterminate. These results were called by telephone at the time of interpretation on 01/22/2016 at 8:01 pm to Dr. Quintella Reichert , who verbally acknowledged these results. Electronically Signed   By: Markus Daft M.D.   On: 01/22/2016 20:05   Dg Chest Port 1 View  01/22/2016  CLINICAL DATA:  Fall from ladder with head injury. EXAM:  PORTABLE CHEST 1 VIEW COMPARISON:  08/28/2009 chest radiograph FINDINGS: Sternotomy wires appear aligned and intact. Stable cardiomediastinal silhouette with normal heart size. No pneumothorax. No pleural effusion. Bibasilar atelectasis. No pulmonary edema. Minimally displaced posterior left seventh and eighth rib fractures, which appear acute. IMPRESSION: 1. Minimally displaced acute posterior left seventh and eighth rib fractures. No pneumothorax . 2. Bibasilar atelectasis. Electronically Signed   By: Ilona Sorrel M.D.   On: 01/22/2016 18:30    Review of Systems  Constitutional: Negative.   HENT: Negative.   Eyes: Negative.   Respiratory: Negative.   Cardiovascular: Negative.   Gastrointestinal: Negative.   Genitourinary: Negative.   Musculoskeletal: Negative.   Skin: Negative.   Neurological: Negative.   Endo/Heme/Allergies: Negative.   Psychiatric/Behavioral: Negative.    Blood pressure 112/69, pulse 108, temperature 97.2 F (36.2 C), temperature source Oral, resp. rate 19, SpO2 95 %. Physical Exam  HENT:  Head: Normocephalic.  Eyes: Pupils are equal, round, and reactive to light.  Neck: Normal range of motion.  Respiratory: Effort normal and breath sounds normal.  GI: Soft. Bowel sounds are normal.  Neurological: He has normal strength. GCS eye subscore is 4. GCS verbal subscore is 5. GCS motor subscore is 6.  Patient is awake and alert he is oriented times 4. HE is very hard of hearing significant communication is very difficult but he has no focal motor or sensory deficits. He has no neck pain he has full range of motion of his neck.   Skin: Skin is warm and dry.    Assessment/Plan: 82 year old with close head injury traumatic subarachnoid hemorrhage and a prior, contusions and small falcine subdural. He also has mild lumbar TP fracture. Patient apparently can be seen by trauma and admitted to either trauma or medicine recommend stepdown admission overnight for observation  repeat CT in the morning patient can be mobilized at that point and overnight patient can have a diet advanced as tolerated I would hold his aspirin for 24 hours.  , P 01/22/2016, 8:32 PM

## 2016-01-22 NOTE — ED Provider Notes (Signed)
CSN: [id number]     Arrival date & time 01/22/16  1725 History   First MD Initiated Contact with Patient 01/22/16 1728     Chief Complaint  Patient presents with  . Head Injury     Patient is a 82 y.o. male presenting with head injury. The history is provided by the patient and the EMS personnel. No language interpreter was used.  Head Injury  Raymond Swanson is a 82 y.o. male who presents to the Emergency Department complaining of head injury.  Level V caveat due to confusion. History is provided by EMS. Per EMS patient was on a stepladder cleaning the gutters and must have fallen on the ground. It was an unwitnessed fall. They noticed a puddle of blood on the ground. His wife found him sitting in the car. Patient does not recall the events and complains of pain to his left chest. He has no headache, neck pain, abdominal pain, shortness breath, numbness, weakness. The step ladder was approximately 3-4 feet tall and that is the greatest height he is presumed to have fallen from.  Past Medical History  Diagnosis Date  . Cancer ([place])   . ACUT GASTR ULCER W/HEMORR W/O MENTION OBST 09/10/2009  . ANEMIA 10/08/2009  . CORONARY ARTERY DISEASE 01/26/2007  . HYPERLIPIDEMIA 01/26/2007  . HYPERTENSION 01/26/2007  . HYPOTHYROIDISM 10/18/2007  . MELENA 08/28/2009  . PROSTATE CANCER, HX OF 01/26/2007  . SKIN CANCER, HX OF 01/26/2007   Past Surgical History  Procedure Laterality Date  . Coronary artery bypass graft      1991  . Hernia repair      ingunial  . Prostate surgery      prostatectomy  . Mohs surgery    . Cataract extraction     Family History  Problem Relation Age of Onset  . Lung cancer Brother 66  . GI problems Sister    Social History  Substance Use Topics  . Smoking status: Former Smoker -- 1.00 packs/day for 20 years    Types: Cigarettes    Quit date: 10/28/1948  . Smokeless tobacco: Never Used  . Alcohol Use: Yes    Review of Systems  All other systems reviewed and are  negative.     Allergies  Review of patient's allergies indicates no known allergies.  Home Medications   Prior to Admission medications   Medication Sig Start Date End Date Taking? Authorizing Provider  aspirin 81 MG tablet Take 81 mg by mouth daily.      Historical Provider, MD  calcium gluconate 500 MG tablet Take 500 mg by mouth daily.      Historical Provider, MD  cholecalciferol (VITAMIN D) 1000 UNITS tablet Take 1,000 Units by mouth daily.      Historical Provider, MD  diphenhydramine-acetaminophen (TYLENOL PM) 25-500 MG TABS Take 1 tablet by mouth at bedtime as needed.      Historical Provider, MD  fluorouracil (EFUDEX) 5 % cream Apply topically 2 (two) times daily. 11/15/12   Marletta Lor, MD  levothyroxine (SYNTHROID, LEVOTHROID) 50 MCG tablet Take 1 tablet (50 mcg total) by mouth daily. 12/31/15   Marletta Lor, MD  Melatonin 5 MG TABS Take 2.5 mg by mouth at bedtime.    Historical Provider, MD  Multiple Vitamin (MULTIVITAMIN) tablet Take 1 tablet by mouth daily.      Historical Provider, MD  Multiple Vitamins-Minerals (PRESERVISION/LUTEIN PO) Take by mouth. Daily       Historical Provider, MD  nitroGLYCERIN (NITROSTAT)  0.4 MG SL tablet Place 1 tablet (0.4 mg total) under the tongue every 5 (five) minutes as needed. 12/31/15   Marletta Lor, MD  Omega-3 Fatty Acids (OMEGA-3 FISH OIL PO) Take by mouth daily.    Historical Provider, MD  simvastatin (ZOCOR) 20 MG tablet Take 1 tablet (20 mg total) by mouth daily. 12/31/15   Marletta Lor, MD   BP 110/68 mmHg  Pulse 92  Temp(Src) 98.2 F (36.8 C) (Oral)  Resp 18  Ht 5\' 8"  (1.727 m)  Wt 137 lb 9.1 oz (62.4 kg)  BMI 20.92 kg/m2  SpO2 93% Physical Exam  Constitutional: He appears well-developed and well-nourished.  HENT:  Head: Normocephalic.  Ecchymosis to the right forehead. There is abrasion and hematoma to the left posterior scalp.  Eyes:  Left pupil is 4 mm and reactive, right pupil is 3 mm and  reactive, EOMI  Neck:  No C, T, L-spine tenderness  Cardiovascular: Normal rate and regular rhythm.   No murmur heard. Pulmonary/Chest: Effort normal and breath sounds normal. No respiratory distress.  There is left anterior and axillary chest wall tenderness to palpation without ecchymosis or abrasion  Abdominal: Soft. There is no tenderness. There is no rebound and no guarding.  Musculoskeletal:  No arm, leg, pelvis tenderness to palpation. There is abrasion over the left posterior elbow. Full range of motion intact in bilateral upper extremities.  Neurological: He is alert.  Moves all extremities weakly but symmetrically. Oriented to person place and time. He is amnestic to events in transport to the hospital. Perseveration and frequently asking questions of why he is here and how he got here.  Skin: Skin is warm and dry.  Psychiatric: He has a normal mood and affect. His behavior is normal.  Nursing note and vitals reviewed.   ED Course  Procedures (including critical care time) Labs Review Labs Reviewed  COMPREHENSIVE METABOLIC PANEL - Abnormal; Notable for the following:    Glucose, Bld 149 (*)    Total Protein 5.9 (*)    GFR calc non Af Amer 52 (*)    All other components within normal limits  CBC WITH DIFFERENTIAL/PLATELET - Abnormal; Notable for the following:    WBC 16.7 (*)    Neutro Abs 14.9 (*)    Lymphs Abs 0.6 (*)    Monocytes Absolute 1.2 (*)    All other components within normal limits  I-STAT CHEM 8, ED - Abnormal; Notable for the following:    BUN 24 (*)    Glucose, Bld 144 (*)    All other components within normal limits  MRSA PCR SCREENING  PROTIME-INR  CBC  BASIC METABOLIC PANEL  I-STAT TROPOININ, ED  TYPE AND SCREEN  ABO/RH    Imaging Review Ct Head Wo Contrast  01/22/2016  CLINICAL DATA:  Fall from ladder with head injury. Right forehead and posterior head laceration. Prostate cancer. EXAM: CT HEAD WITHOUT CONTRAST CT CERVICAL SPINE WITHOUT  CONTRAST TECHNIQUE: Multidetector CT imaging of the head and cervical spine was performed following the standard protocol without intravenous contrast. Multiplanar CT image reconstructions of the cervical spine were also generated. COMPARISON:  03/12/2014 PET-CT. FINDINGS: CT HEAD FINDINGS Moderate left parietal scalp contusion. Acute right frontotemporal convexity subdural hematoma measuring up to 4 mm in thickness along the right anterior falx. Acute subarachnoid hemorrhage throughout the right cerebral sulci, predominantly right inferior frontal and temporoparietal. Hemorrhagic cortical contusions/subarachnoid hemorrhage in the bilateral anterior frontal lobes. Probable small amount of acute subarachnoid hemorrhage  in the interpeduncular cistern. No mass lesion. No midline shift. Basilar cisterns remain patent. Intracranial atherosclerosis. Nonspecific mild subcortical and periventricular white matter hypodensity, most in keeping with chronic small vessel ischemic change. Cerebral volume is age appropriate. No ventriculomegaly. The visualized paranasal sinuses are essentially clear. The mastoid air cells are unopacified. No evidence of calvarial fracture. CT CERVICAL SPINE FINDINGS No fracture is detected in the cervical spine. No prevertebral soft tissue swelling. There is mild straightening of the cervical spine, usually due to positioning and/or muscle spasm. Dens is well positioned between the lateral masses of C1. The lateral masses appear well-aligned. Moderate degenerative disc disease in the mid to lower cervical spine, most prominent at C5-6. Mild-to-moderate bilateral facet arthropathy. No significant cervical foraminal stenosis. No cervical spine subluxation. Visualized mastoid air cells appear clear. No evidence of intra-axial hemorrhage in the visualized brain. No gross cervical canal hematoma. Centrilobular emphysema at the lung apices. No significant pulmonary nodules at the visualized lung apices.  No cervical adenopathy or other significant neck soft tissue abnormality. IMPRESSION: 1. Moderate left parietal scalp contusion. 2. Acute right frontotemporal convexity subdural hematoma. 3. Acute subarachnoid hemorrhage throughout the right cerebral sulci. 4. Acute hemorrhagic cortical contusions/subarachnoid hemorrhage in the bilateral anterior frontal lobes. 5. No midline shift. Basilar cisterns remain patent. No ventriculomegaly. 6. Mild chronic small vessel ischemia. 7. No cervical spine fracture or subluxation. 8. Moderate cervical spine degenerative changes. Critical Value/emergent results were called by telephone at the time of interpretation on 01/22/2016 at 7:52 pm to Dr. Quintella Reichert , who verbally acknowledged these results. Electronically Signed   By: Ilona Sorrel M.D.   On: 01/22/2016 19:53   Ct Chest W Contrast  01/22/2016  CLINICAL DATA:  Fall with head hematoma. EXAM: CT CHEST, ABDOMEN, AND PELVIS WITH CONTRAST TECHNIQUE: Multidetector CT imaging of the chest, abdomen and pelvis was performed following the standard protocol during bolus administration of intravenous contrast. CONTRAST:  100 mL Isovue-300 COMPARISON:  03/20/2014 FINDINGS: CT CHEST FINDINGS Mediastinum/Lymph Nodes: The thoracic aorta is heavily calcified without aneurysm. Flow in the great vessels. No evidence for a mediastinal hematoma. Coronary artery calcifications. Small hiatal hernia. Lungs/Pleura: Trachea and mainstem bronchi are patent. Motion artifact. Lucency of the upper lungs and cannot exclude emphysema. Small air-filled collections along the medial left upper lobe adjacent to the descending thoracic aorta. These are new from the prior CT. Question a tiny nodular structure in the left upper lobe on sequence 5, image 30 which is probably stable. There is some atelectasis at both lung bases. Stable punctate nodule along the left major fissure on sequence 5, image 32. One or two small nodular densities in the superior  segment of left lower lobe are new on sequence 5, image 31. Largest nodular density roughly measures 5 mm. Musculoskeletal: Fractures of left sixth, seventh, eighth, ninth and tenth ribs. No definite pneumothorax. Both shoulders are located. Visualized clavicles are intact. Prior median sternotomy. No large pneumothorax. There is a compression fracture involving the T8 vertebral body. There is an anterior wedge deformity of the T8 vertebral body with approximately 60% loss of height along the anterior aspect. CT ABDOMEN PELVIS FINDINGS Hepatobiliary: Motion artifact on this exam limits evaluation. Small low-density structures in the liver could represent small cysts. There is high-density material in the gallbladder suggestive for stones. No significant biliary dilatation. Portal venous system appears to be patent. Pancreas: No gross abnormality to the pancreas. Spleen: No gross abnormality to the spleen. No evidence for perisplenic fluid. Adrenals/Urinary  Tract: Mild fullness of left adrenal gland is probably stable. No gross abnormality to the right adrenal gland. Exophytic left renal cyst. Normal appearance of the urinary bladder. Mild fullness of the renal collecting systems bilaterally. Stomach/Bowel: Small hiatal hernia. There is no significant dilatation of the bowel structures and no evidence for a bowel obstruction. There is mesenteric edema in the left lower abdomen on sequence 4, image 87. This is a difficult area to evaluate due to motion artifact throughout the study. There may also be a small amount of mesenteric edema in the right lower abdomen. Vascular/Lymphatic: Vascular structures are heavily calcified. The infrarenal abdominal aorta measures up to 2.6 cm. No suspicious abdominal or pelvic lymphadenopathy. Reproductive: No gross abnormality to the prostate. Other: No evidence for free air. There is edema or a small amount of fluid in the left abdominal mesentery. Musculoskeletal: Again noted is  heterogeneity of the in the left ilium related to Paget's disease and this is similar to the prior examination. There is a fracture of the left L4 transverse process. Fracture of the left eleventh rib. There is also a compression deformity involving the L1 vertebral body which could be old. Marked disc space loss at L5-S1. Bilateral pars defects at L5. Minimal anterolisthesis at L5-S1. IMPRESSION: Multiple left posterior rib fractures (6 through 11) without a pneumothorax. New small air-filled collections along the medial left upper lobe could represent traumatic pneumatoceles. Few new parenchymal nodular densities in left lower lobe are nonspecific but could be also related to recent trauma. Evaluation of the intra-abdominal structures is limited due to motion artifact but there is evidence for mesenteric edema and fluid, particularly in the left abdomen and left lower quadrant. Findings raise concern for a mesenteric injury. There is no evidence for free air. Fracture of the L4 left transverse process. Compression fractures involving T8 and L1. Suspect that the T8 fracture is acute but these are age-indeterminate. These results were called by telephone at the time of interpretation on 01/22/2016 at 8:01 pm to Dr. Quintella Reichert , who verbally acknowledged these results. Electronically Signed   By: Markus Daft M.D.   On: 01/22/2016 20:05   Ct Cervical Spine Wo Contrast  01/22/2016  CLINICAL DATA:  Fall from ladder with head injury. Right forehead and posterior head laceration. Prostate cancer. EXAM: CT HEAD WITHOUT CONTRAST CT CERVICAL SPINE WITHOUT CONTRAST TECHNIQUE: Multidetector CT imaging of the head and cervical spine was performed following the standard protocol without intravenous contrast. Multiplanar CT image reconstructions of the cervical spine were also generated. COMPARISON:  03/12/2014 PET-CT. FINDINGS: CT HEAD FINDINGS Moderate left parietal scalp contusion. Acute right frontotemporal convexity  subdural hematoma measuring up to 4 mm in thickness along the right anterior falx. Acute subarachnoid hemorrhage throughout the right cerebral sulci, predominantly right inferior frontal and temporoparietal. Hemorrhagic cortical contusions/subarachnoid hemorrhage in the bilateral anterior frontal lobes. Probable small amount of acute subarachnoid hemorrhage in the interpeduncular cistern. No mass lesion. No midline shift. Basilar cisterns remain patent. Intracranial atherosclerosis. Nonspecific mild subcortical and periventricular white matter hypodensity, most in keeping with chronic small vessel ischemic change. Cerebral volume is age appropriate. No ventriculomegaly. The visualized paranasal sinuses are essentially clear. The mastoid air cells are unopacified. No evidence of calvarial fracture. CT CERVICAL SPINE FINDINGS No fracture is detected in the cervical spine. No prevertebral soft tissue swelling. There is mild straightening of the cervical spine, usually due to positioning and/or muscle spasm. Dens is well positioned between the lateral masses of C1. The  lateral masses appear well-aligned. Moderate degenerative disc disease in the mid to lower cervical spine, most prominent at C5-6. Mild-to-moderate bilateral facet arthropathy. No significant cervical foraminal stenosis. No cervical spine subluxation. Visualized mastoid air cells appear clear. No evidence of intra-axial hemorrhage in the visualized brain. No gross cervical canal hematoma. Centrilobular emphysema at the lung apices. No significant pulmonary nodules at the visualized lung apices. No cervical adenopathy or other significant neck soft tissue abnormality. IMPRESSION: 1. Moderate left parietal scalp contusion. 2. Acute right frontotemporal convexity subdural hematoma. 3. Acute subarachnoid hemorrhage throughout the right cerebral sulci. 4. Acute hemorrhagic cortical contusions/subarachnoid hemorrhage in the bilateral anterior frontal lobes. 5.  No midline shift. Basilar cisterns remain patent. No ventriculomegaly. 6. Mild chronic small vessel ischemia. 7. No cervical spine fracture or subluxation. 8. Moderate cervical spine degenerative changes. Critical Value/emergent results were called by telephone at the time of interpretation on 01/22/2016 at 7:52 pm to Dr. Quintella Reichert , who verbally acknowledged these results. Electronically Signed   By: Ilona Sorrel M.D.   On: 01/22/2016 19:53   Ct Abdomen Pelvis W Contrast  01/22/2016  CLINICAL DATA:  Fall with head hematoma. EXAM: CT CHEST, ABDOMEN, AND PELVIS WITH CONTRAST TECHNIQUE: Multidetector CT imaging of the chest, abdomen and pelvis was performed following the standard protocol during bolus administration of intravenous contrast. CONTRAST:  100 mL Isovue-300 COMPARISON:  03/20/2014 FINDINGS: CT CHEST FINDINGS Mediastinum/Lymph Nodes: The thoracic aorta is heavily calcified without aneurysm. Flow in the great vessels. No evidence for a mediastinal hematoma. Coronary artery calcifications. Small hiatal hernia. Lungs/Pleura: Trachea and mainstem bronchi are patent. Motion artifact. Lucency of the upper lungs and cannot exclude emphysema. Small air-filled collections along the medial left upper lobe adjacent to the descending thoracic aorta. These are new from the prior CT. Question a tiny nodular structure in the left upper lobe on sequence 5, image 30 which is probably stable. There is some atelectasis at both lung bases. Stable punctate nodule along the left major fissure on sequence 5, image 32. One or two small nodular densities in the superior segment of left lower lobe are new on sequence 5, image 31. Largest nodular density roughly measures 5 mm. Musculoskeletal: Fractures of left sixth, seventh, eighth, ninth and tenth ribs. No definite pneumothorax. Both shoulders are located. Visualized clavicles are intact. Prior median sternotomy. No large pneumothorax. There is a compression fracture  involving the T8 vertebral body. There is an anterior wedge deformity of the T8 vertebral body with approximately 60% loss of height along the anterior aspect. CT ABDOMEN PELVIS FINDINGS Hepatobiliary: Motion artifact on this exam limits evaluation. Small low-density structures in the liver could represent small cysts. There is high-density material in the gallbladder suggestive for stones. No significant biliary dilatation. Portal venous system appears to be patent. Pancreas: No gross abnormality to the pancreas. Spleen: No gross abnormality to the spleen. No evidence for perisplenic fluid. Adrenals/Urinary Tract: Mild fullness of left adrenal gland is probably stable. No gross abnormality to the right adrenal gland. Exophytic left renal cyst. Normal appearance of the urinary bladder. Mild fullness of the renal collecting systems bilaterally. Stomach/Bowel: Small hiatal hernia. There is no significant dilatation of the bowel structures and no evidence for a bowel obstruction. There is mesenteric edema in the left lower abdomen on sequence 4, image 87. This is a difficult area to evaluate due to motion artifact throughout the study. There may also be a small amount of mesenteric edema in the right lower abdomen. Vascular/Lymphatic:  Vascular structures are heavily calcified. The infrarenal abdominal aorta measures up to 2.6 cm. No suspicious abdominal or pelvic lymphadenopathy. Reproductive: No gross abnormality to the prostate. Other: No evidence for free air. There is edema or a small amount of fluid in the left abdominal mesentery. Musculoskeletal: Again noted is heterogeneity of the in the left ilium related to Paget's disease and this is similar to the prior examination. There is a fracture of the left L4 transverse process. Fracture of the left eleventh rib. There is also a compression deformity involving the L1 vertebral body which could be old. Marked disc space loss at L5-S1. Bilateral pars defects at L5.  Minimal anterolisthesis at L5-S1. IMPRESSION: Multiple left posterior rib fractures (6 through 11) without a pneumothorax. New small air-filled collections along the medial left upper lobe could represent traumatic pneumatoceles. Few new parenchymal nodular densities in left lower lobe are nonspecific but could be also related to recent trauma. Evaluation of the intra-abdominal structures is limited due to motion artifact but there is evidence for mesenteric edema and fluid, particularly in the left abdomen and left lower quadrant. Findings raise concern for a mesenteric injury. There is no evidence for free air. Fracture of the L4 left transverse process. Compression fractures involving T8 and L1. Suspect that the T8 fracture is acute but these are age-indeterminate. These results were called by telephone at the time of interpretation on 01/22/2016 at 8:01 pm to Dr. Quintella Reichert , who verbally acknowledged these results. Electronically Signed   By: Markus Daft M.D.   On: 01/22/2016 20:05   Dg Chest Port 1 View  01/22/2016  CLINICAL DATA:  Fall from ladder with head injury. EXAM: PORTABLE CHEST 1 VIEW COMPARISON:  08/28/2009 chest radiograph FINDINGS: Sternotomy wires appear aligned and intact. Stable cardiomediastinal silhouette with normal heart size. No pneumothorax. No pleural effusion. Bibasilar atelectasis. No pulmonary edema. Minimally displaced posterior left seventh and eighth rib fractures, which appear acute. IMPRESSION: 1. Minimally displaced acute posterior left seventh and eighth rib fractures. No pneumothorax . 2. Bibasilar atelectasis. Electronically Signed   By: Ilona Sorrel M.D.   On: 01/22/2016 18:30   I have personally reviewed and evaluated these images and lab results as part of my medical decision-making.   EKG Interpretation   Date/Time:  Wednesday Jan 22 2016 19:32:56 EDT Ventricular Rate:  97 PR Interval:  179 QRS Duration: 115 QT Interval:  367 QTC Calculation: 466 R Axis:    57 Text Interpretation:  sinus tachy with sinus arrhythmia Nonspecific  intraventricular conduction delay Minimal ST depression, anterolateral  leads Confirmed by Hazle Coca [phone]) on 01/22/2016 7:36:02 PM      MDM   Final diagnoses:  SAH (subarachnoid hemorrhage) ([facility])  Fracture, ribs, left, closed, initial encounter    Pt here following a presumed fall, has evidence of head injury on exam but no recollection of events.  CT chest/abd/pelvis obtained given unclear hx and pt's report of chest pain.  Imaging demonstrates SAH with SDH, rib fractures, lumbar/thoracic compression fractures.  D/w Dr. Saintclair Halsted, who evaluated the patient in the Emergency Department.  Discussed with Dr. Kieth Brightly with Trauma who evaluated the patient in the ED.  Pt and wife updated of findings of studies and need for admission for further management.    Pt with stable mental status on multiple rechecks in the Emergency Department.    Quintella Reichert, MD 01/23/16 [phone]

## 2016-01-22 NOTE — ED Notes (Signed)
MD at bedside. 

## 2016-01-22 NOTE — ED Notes (Addendum)
Pt reports to the ED for eval of head injury. Pt was standing on a 3 step ladder cleaning out his gutters and he fell off of it and struck his head on a dirt/grass area. Pool of blood noted at the fall site however patient had gotten up and gone and gotten in his car. Pt is normally A&Ox4 but at this time he is only oriented to person and date on arrival. Pt is not on blood thinners. Bleeding controlled at this time. Laceration to the right forehead and the posterior aspect of his head. Also a hematoma is noted to his posterior head. Skin tear also noted to left elbow. Bandage in place at this time. Resp e/u and skin warm and dry. At this time patient A&O to person, place, and time. Unable to remember fall. Towel in place around neck and pelvis stabilized for precautionary purposes.

## 2016-01-22 NOTE — ED Notes (Signed)
RN accompanied patient to CT

## 2016-01-23 ENCOUNTER — Inpatient Hospital Stay (HOSPITAL_COMMUNITY): Payer: Medicare Other

## 2016-01-23 LAB — CBC
HEMATOCRIT: 37.3 % — AB (ref 39.0–52.0)
HEMOGLOBIN: 11.9 g/dL — AB (ref 13.0–17.0)
MCH: 29.4 pg (ref 26.0–34.0)
MCHC: 31.9 g/dL (ref 30.0–36.0)
MCV: 92.1 fL (ref 78.0–100.0)
PLATELETS: 149 10*3/uL — AB (ref 150–400)
RBC: 4.05 MIL/uL — AB (ref 4.22–5.81)
RDW: 14.2 % (ref 11.5–15.5)
WBC: 11.8 10*3/uL — AB (ref 4.0–10.5)

## 2016-01-23 LAB — MRSA PCR SCREENING: MRSA by PCR: NEGATIVE

## 2016-01-23 LAB — BASIC METABOLIC PANEL
ANION GAP: 4 — AB (ref 5–15)
BUN: 18 mg/dL (ref 6–20)
CHLORIDE: 107 mmol/L (ref 101–111)
CO2: 28 mmol/L (ref 22–32)
Calcium: 9 mg/dL (ref 8.9–10.3)
Creatinine, Ser: 1.07 mg/dL (ref 0.61–1.24)
GFR calc Af Amer: >60 mL/min (ref >60)
GFR calc non Af Amer: 59 mL/min — ABNORMAL LOW (ref >60)
GLUCOSE: 183 mg/dL — AB (ref 65–99)
POTASSIUM: 4.4 mmol/L (ref 3.5–5.1)
Sodium: 139 mmol/L (ref 135–145)

## 2016-01-23 MED ORDER — MORPHINE SULFATE (PF) 2 MG/ML IV SOLN
1.0000 mg | INTRAVENOUS | Status: DC | PRN
  Administered 2016-01-23 (×2): 1 mg via INTRAVENOUS
  Filled 2016-01-23 (×2): qty 1

## 2016-01-23 MED ORDER — TRAMADOL HCL 50 MG PO TABS
50.0000 mg | ORAL_TABLET | Freq: Four times a day (QID) | ORAL | Status: DC | PRN
Start: 2016-01-23 — End: 2016-01-26
  Administered 2016-01-23 – 2016-01-24 (×2): 50 mg via ORAL
  Filled 2016-01-23 (×2): qty 1

## 2016-01-23 MED ORDER — MORPHINE SULFATE (PF) 2 MG/ML IV SOLN
1.0000 mg | INTRAVENOUS | Status: DC | PRN
  Administered 2016-01-23 – 2016-01-24 (×2): 2 mg via INTRAVENOUS
  Filled 2016-01-23 (×2): qty 1

## 2016-01-23 NOTE — Evaluation (Signed)
Physical Therapy Evaluation Patient Details Name: Raymond Swanson MRN: [id number] DOB: [birth year] Today's Date: 01/23/2016   History of Present Illness  82 yo male with SDH and multiple L sided rib fractures from fall while cleaning gutters. CT on 6/1 showed Progression of right frontal and parietal subdural hematomas, Progression of left subdural hygroma, No shift of the midline structures, and Progression of right frontal hemorrhagic contusion.Pt with significant PMhx of CA (skin and prostate), anemia, CAD, HTN, and CABG.  Clinical Impression  Pt was able to stand and pivot to the recliner chair, but is significantly limited by pain in his left ribs.  He was very active and independent PTA, but did have a remote hx of falls.  He would benefit from rehab before returning home to help increase his independence and decrease the burden on his Raymond Swanson.   PT to follow acutely for deficits listed below.       Follow Up Recommendations CIR    Equipment Recommendations  Rolling walker with 5" wheels    Recommendations for Other Services Rehab consult     Precautions / Restrictions Precautions Precautions: Fall Precaution Comments: pt reports h/o fall, "I get tangeled in my feet" Required Braces or Orthoses: Cervical Brace Cervical Brace: Soft collar (pt declining to wear during session) Restrictions Weight Bearing Restrictions: No      Mobility  Bed Mobility Overal bed mobility: Needs Assistance Bed Mobility: Supine to Sit     Supine to sit: Min assist;HOB elevated     General bed mobility comments: Min assist to support trunk during transition to sit and provide a hand to pull up.  Pt managing his legs on his own.   Transfers Overall transfer level: Needs assistance Equipment used: 1 person hand held assist Transfers: Sit to/from [facility] Sit to Stand: Min assist Stand pivot transfers: Min assist       General transfer comment: Heavy reliance on hands  for support and only able to stand and pivot to the chair as he was significantly limited by rib pain.          Balance Overall balance assessment: Needs assistance Sitting-balance support: Feet supported;Bilateral upper extremity supported Sitting balance-Leahy Scale: Fair     Standing balance support: Single extremity supported;Bilateral upper extremity supported Standing balance-Leahy Scale: Poor                               Pertinent Vitals/Pain Pain Assessment: Faces Faces Pain Scale: Hurts whole lot Pain Location: left sided ribs Pain Descriptors / Indicators: Grimacing;Guarding Pain Intervention(s): Limited activity within patient's tolerance;Monitored during session;Repositioned;RN gave pain meds during session (RN gave IV morphine)    Home Living Family/patient expects to be discharged to:: Private residence Living Arrangements: Spouse/significant other (5 years younger and very active/independent) Available Help at Discharge: Family;Available 24 hours/day Type of Home: House Home Access: Stairs to enter Entrance Stairs-Rails: Right Entrance Stairs-Number of Steps: 2 Home Layout: One level Home Equipment: Cane - single point      Prior Function Level of Independence: Independent         Comments: Walked with his dog every day, likes to garden, Swanson likes to bowl     Hand Dominance   Dominant Hand: Right    Extremity/Trunk Assessment   Upper Extremity Assessment: Defer to OT evaluation           Lower Extremity Assessment: Generalized weakness  Cervical / Trunk Assessment: Normal  Communication   Communication: HOH  Cognition Arousal/Alertness: Awake/alert Behavior During Therapy: WFL for tasks assessed/performed Overall Cognitive Status: Within Functional Limits for tasks assessed (A&O x 4, doesn't remember the actual fall)                      General Comments General comments (skin integrity, edema, etc.): Pt  with increased DOE with mobility. RN made aware.  O2 sats before and after mobility (when waveform was good) were in the 90s.            Assessment/Plan    PT Assessment Patient needs continued PT services  PT Diagnosis Difficulty walking;Abnormality of gait;Generalized weakness;Acute pain   PT Problem List Decreased strength;Decreased activity tolerance;Decreased balance;Decreased mobility;Decreased knowledge of use of DME;Pain  PT Treatment Interventions DME instruction;Gait training;Stair training;Functional mobility training;Therapeutic activities;Therapeutic exercise;Balance training;Neuromuscular re-education;Cognitive remediation;Patient/family education;Modalities   PT Goals (Current goals can be found in the Care Plan section) Acute Rehab PT Goals Patient Stated Goal: decrease pain PT Goal Formulation: With patient Time For Goal Achievement: 02/06/16 Potential to Achieve Goals: Good    Frequency Min 3X/week           End of Session   Activity Tolerance: Patient limited by pain Patient left: in chair;with call bell/phone within reach;with chair alarm set Nurse Communication: Mobility status         Time: [id number] PT Time Calculation (min) (ACUTE ONLY): 20 min   Charges:   PT Evaluation $PT Eval Moderate Complexity: 1 Procedure          Shauntelle Jamerson B. [place], [place], DPT [phone]   01/23/2016, 3:55 PM

## 2016-01-23 NOTE — Progress Notes (Signed)
Rehab Admissions Coordinator Note:  Patient was screened by Cleatrice Burke for appropriateness for an Inpatient Acute Rehab Consult per PT and OT recommendation.  At this time, we are recommending Inpatient Rehab consult. Please place order when appropriate.  Cleatrice Burke 01/23/2016, 4:05 PM  I can be reached at [phone].

## 2016-01-23 NOTE — Evaluation (Signed)
Occupational Therapy Evaluation Patient Details Name: Raymond Swanson MRN: [id number] DOB: [birth year] Today's Date: 01/23/2016    History of Present Illness 82 yo male with SDH and multiple L sided rib fractures from fall while cleaning gutters. CT on 6/1 showed Progression of right frontal and parietal subdural hematomas, Progression of left subdural hygroma, No shift of the midline structures, and Progression of right frontal hemorrhagic contusion.   Clinical Impression   Pt reports he was very independent with ADLs and mobility PTA. Currently pt requiring min assist for sit to stand from chair and ADLs, mod assist for LB ADLs. Pts participation this session limited by pain in L ribs; further mobility not assessed secondary to pain. Pt is very HOH and no family present; difficult to assess cognition at this time. Recommending CIR level therapies for follow up in order to maximize independence and safety with ADLs and functional mobility prior to return home. Pt would benefit from continued skilled OT to address established goals.    Follow Up Recommendations  CIR;Supervision/Assistance - 24 hour    Equipment Recommendations  3 in 1 bedside comode    Recommendations for Other Services       Precautions / Restrictions Precautions Precautions: Fall Required Braces or Orthoses: Cervical Brace Cervical Brace: Soft collar (pt declining to wear during session) Restrictions Weight Bearing Restrictions: No      Mobility Bed Mobility               General bed mobility comments: Pt OOB in chair upon arrival.  Transfers Overall transfer level: Needs assistance Equipment used: 1 person hand held assist Transfers: Sit to/from Stand Sit to Stand: Min assist         General transfer comment: Min assist to boost up from chair and for balance in standing. Pt reported pain in L ribs when in standing, requesting to sit back down and declining further mobility. VCs for hand placement.      Balance Overall balance assessment: Needs assistance Sitting-balance support: Feet supported Sitting balance-Leahy Scale: Fair     Standing balance support: Single extremity supported Standing balance-Leahy Scale: Poor                              ADL Overall ADL's : Needs assistance/impaired Eating/Feeding: Set up;Sitting   Grooming: Set up;Supervision/safety;Sitting   Upper Body Bathing: Minimal assitance;Sitting   Lower Body Bathing: Moderate assistance;Sit to/from stand   Upper Body Dressing : Minimal assistance;Sitting   Lower Body Dressing: Moderate assistance;Sit to/from stand Lower Body Dressing Details (indicate cue type and reason): Pt with difficulty managing LB ADLs secondary to pain in L ribs.             Functional mobility during ADLs: Minimal assistance (for sit to stand only) General ADL Comments: Pt very HOH making communication difficult. Pt reports significant pain in L ribs with sit to stand from chair, limiting participation in standing ADL activities. SpO2 jumping from low 60s up to mid 90s with poor waveform; instructed in deep breathing and pursed lip breathing but pt reports pain with both.     Vision Additional Comments: to be further assessed in functional context   Perception     Praxis      Pertinent Vitals/Pain Pain Assessment: Faces Faces Pain Scale: Hurts whole lot Pain Location: L side, ribs Pain Descriptors / Indicators: Aching;Grimacing;Guarding;Sharp Pain Intervention(s): Limited activity within patient's tolerance;Monitored during session  Hand Dominance Right   Extremity/Trunk Assessment Upper Extremity Assessment Upper Extremity Assessment: Overall WFL for tasks assessed (rib pain limiting assessment)   Lower Extremity Assessment Lower Extremity Assessment: Defer to PT evaluation       Communication Communication Communication: HOH   Cognition Arousal/Alertness: Awake/alert Behavior During  Therapy: WFL for tasks assessed/performed Overall Cognitive Status: Difficult to assess                     General Comments       Exercises       Shoulder Instructions      Home Living Family/patient expects to be discharged to:: Private residence Living Arrangements: Spouse/significant other Available Help at Discharge: Family;Available 24 hours/day Type of Home: House       Home Layout: One level     Bathroom Shower/Tub: Occupational psychologist: Standard     Home Equipment: None          Prior Functioning/Environment Level of Independence: Independent        Comments: Walked with his dog every day    OT Diagnosis: Generalized weakness;Acute pain   OT Problem List: Decreased strength;Decreased activity tolerance;Impaired balance (sitting and/or standing);Decreased knowledge of use of DME or AE;Decreased knowledge of precautions;Pain   OT Treatment/Interventions: Self-care/ADL training;Energy conservation;DME and/or AE instruction;Therapeutic activities;Patient/family education;Balance training    OT Goals(Current goals can be found in the care plan section) Acute Rehab OT Goals Patient Stated Goal: decrease pain OT Goal Formulation: With patient Time For Goal Achievement: 02/06/16 Potential to Achieve Goals: Good  OT Frequency: Min 2X/week   Barriers to D/C:            Co-evaluation              End of Session Equipment Utilized During Treatment: Gait belt  Activity Tolerance: Patient limited by pain Patient left: in chair;with call bell/phone within reach;with nursing/sitter in room   Time: [id number] OT Time Calculation (min): 18 min Charges:  OT General Charges $OT Visit: 1 Procedure OT Evaluation $OT Eval Moderate Complexity: 1 Procedure G-Codes:     Binnie Kand M.S., OTR/L Pager: [phone]  01/23/2016, 3:48 PM

## 2016-01-23 NOTE — Progress Notes (Signed)
Initial Nutrition Assessment  DOCUMENTATION CODES:   Severe malnutrition in context of social or environmental circumstances  INTERVENTION:   Encouraged adequate nutrition to gain his lost weight and maintain his lean body mass.   Magic cup TID with meals, each supplement provides 290 kcal and 9 grams of protein  NUTRITION DIAGNOSIS:   Malnutrition (Severe) related to social / environmental circumstances as evidenced by severe depletion of muscle mass, severe depletion of body fat.  GOAL:   Patient will meet greater than or equal to 90% of their needs  MONITOR:   PO intake, Supplement acceptance, Weight trends  REASON FOR ASSESSMENT:   Malnutrition Screening Tool    ASSESSMENT:   Pt admitted with a head injury after falling 3-4 feet from a step ladder.   Medications reviewed and include: colace, KCl in IVF Pt discussed during ICU rounds and with RN.   Pt HOH. He reports he recently had a physical and everything was fine but he had lost weight and is not eating as much. Stated that his physician encouraged him to gain weight.  Breakfast is toast Lunch is cereal Supper is "a good supper" meat, starch, veggie Pt does not drink supplements and does not want to try them.   Nutrition-Focused physical exam completed. Findings are severe fat depletion, severe muscle depletion, and no edema.    Diet Order:  Diet regular Room service appropriate?: Yes; Fluid consistency:: Thin  Skin:  Reviewed, no issues (head/back incision, skin tear elbow, LLE wound)   Last BM:  5/31  Height:   Ht Readings from Last 1 Encounters:  01/22/16 5\' 8"  (1.727 m)    Weight:   Wt Readings from Last 1 Encounters:  01/22/16 137 lb 9.1 oz (62.4 kg)    Ideal Body Weight:  70 kg  BMI:  Body mass index is 20.92 kg/(m^2).  Estimated Nutritional Needs:   Kcal:  1500-1700  Protein:  75-85 grams  Fluid:  > 1.5 L/day  EDUCATION NEEDS:   Education needs addressed  Maylon Peppers RD,  [place], [place] Pager [phone] After Hours Pager

## 2016-01-23 NOTE — Care Management Note (Signed)
Case Management Note  Patient Details  Name: Raymond Swanson MRN: [id number] Date of Birth: Sep 24, 1924  Subjective/Objective:   Pt admitted on 01/22/16 s/p fall off ladder with multiple rib fractures and SDH.   PTA, pt independent, lives with spouse.                  Action/Plan:  Will follow for home needs as pt progresses.  PT/OT consults pending.    Expected Discharge Date:                  Expected Discharge Plan:  [place]  In-House Referral:     Discharge planning Services  CM Consult  Post Acute Care Choice:    Choice offered to:     DME Arranged:    DME Agency:     HH Arranged:    [place] Agency:     Status of Service:  In process, will continue to follow  Medicare Important Message Given:    Date Medicare IM Given:    Medicare IM give by:    Date Additional Medicare IM Given:    Additional Medicare Important Message give by:     If discussed at [place] of Stay Meetings, dates discussed:    Additional Comments:  Reinaldo Raddle, RN, BSN  Trauma/Neuro ICU Case Manager [phone]

## 2016-01-23 NOTE — Progress Notes (Signed)
Trauma Service Note  Subjective: Patient doing well.  Left sided chest wall pain.  No crepitance.  Moderate headache.  Going to get repeatn head CT in about 45 min  Objective: Vital signs in last 24 hours: Temp:  [97.2 F (36.2 C)-98.5 F (36.9 C)] 98.3 F (36.8 C) (06/01 0800) Pulse Rate:  [70-111] 82 (06/01 0605) Resp:  [14-26] 15 (06/01 0605) BP: (65-154)/(50-83) 146/63 mmHg (06/01 0600) SpO2:  [89 %-98 %] 96 % (06/01 0605) Weight:  [62.4 kg (137 lb 9.1 oz)] 62.4 kg (137 lb 9.1 oz) (05/31 2332) Last BM Date: 01/22/16 (per pt: "in the morning" (pta))  Intake/Output from previous day: 05/31 0701 - 06/01 0700 In: 415 [I.V.:415] Out: 300 [Urine:300] Intake/Output this shift:    General: No acute distress  Lungs: Clear.  CXR without PTX  Abd: Aoft, benign.  Hungry.  Extremities: No clinical signs or symptoms of DVT  Neuro: Intact  Lab Results: CBC   Recent Labs  01/22/16 1811 01/22/16 1824 01/23/16 0516  WBC 16.7*  --  11.8*  HGB 13.1 14.3 11.9*  HCT 41.3 42.0 37.3*  PLT 153  --  149*   BMET  Recent Labs  01/22/16 1811 01/22/16 1824 01/23/16 0516  NA 141 143 139  K 4.3 4.4 4.4  CL 107 103 107  CO2 28  --  28  GLUCOSE 149* 144* 183*  BUN 20 24* 18  CREATININE 1.19 1.10 1.07  CALCIUM 9.4  --  9.0   PT/INR  Recent Labs  01/22/16 1811  LABPROT 14.1  INR 1.07   ABG No results for input(s): PHART, HCO3 in the last 72 hours.  Invalid input(s): PCO2, PO2  Studies/Results: Ct Head Wo Contrast  01/22/2016  CLINICAL DATA:  Fall from ladder with head injury. Right forehead and posterior head laceration. Prostate cancer. EXAM: CT HEAD WITHOUT CONTRAST CT CERVICAL SPINE WITHOUT CONTRAST TECHNIQUE: Multidetector CT imaging of the head and cervical spine was performed following the standard protocol without intravenous contrast. Multiplanar CT image reconstructions of the cervical spine were also generated. COMPARISON:  03/12/2014 PET-CT. FINDINGS: CT HEAD  FINDINGS Moderate left parietal scalp contusion. Acute right frontotemporal convexity subdural hematoma measuring up to 4 mm in thickness along the right anterior falx. Acute subarachnoid hemorrhage throughout the right cerebral sulci, predominantly right inferior frontal and temporoparietal. Hemorrhagic cortical contusions/subarachnoid hemorrhage in the bilateral anterior frontal lobes. Probable small amount of acute subarachnoid hemorrhage in the interpeduncular cistern. No mass lesion. No midline shift. Basilar cisterns remain patent. Intracranial atherosclerosis. Nonspecific mild subcortical and periventricular white matter hypodensity, most in keeping with chronic small vessel ischemic change. Cerebral volume is age appropriate. No ventriculomegaly. The visualized paranasal sinuses are essentially clear. The mastoid air cells are unopacified. No evidence of calvarial fracture. CT CERVICAL SPINE FINDINGS No fracture is detected in the cervical spine. No prevertebral soft tissue swelling. There is mild straightening of the cervical spine, usually due to positioning and/or muscle spasm. Dens is well positioned between the lateral masses of C1. The lateral masses appear well-aligned. Moderate degenerative disc disease in the mid to lower cervical spine, most prominent at C5-6. Mild-to-moderate bilateral facet arthropathy. No significant cervical foraminal stenosis. No cervical spine subluxation. Visualized mastoid air cells appear clear. No evidence of intra-axial hemorrhage in the visualized brain. No gross cervical canal hematoma. Centrilobular emphysema at the lung apices. No significant pulmonary nodules at the visualized lung apices. No cervical adenopathy or other significant neck soft tissue abnormality. IMPRESSION: 1. Moderate left  parietal scalp contusion. 2. Acute right frontotemporal convexity subdural hematoma. 3. Acute subarachnoid hemorrhage throughout the right cerebral sulci. 4. Acute hemorrhagic  cortical contusions/subarachnoid hemorrhage in the bilateral anterior frontal lobes. 5. No midline shift. Basilar cisterns remain patent. No ventriculomegaly. 6. Mild chronic small vessel ischemia. 7. No cervical spine fracture or subluxation. 8. Moderate cervical spine degenerative changes. Critical Value/emergent results were called by telephone at the time of interpretation on 01/22/2016 at 7:52 pm to Dr. Quintella Reichert , who verbally acknowledged these results. Electronically Signed   By: Ilona Sorrel M.D.   On: 01/22/2016 19:53   Ct Chest W Contrast  01/22/2016  CLINICAL DATA:  Fall with head hematoma. EXAM: CT CHEST, ABDOMEN, AND PELVIS WITH CONTRAST TECHNIQUE: Multidetector CT imaging of the chest, abdomen and pelvis was performed following the standard protocol during bolus administration of intravenous contrast. CONTRAST:  100 mL Isovue-300 COMPARISON:  03/20/2014 FINDINGS: CT CHEST FINDINGS Mediastinum/Lymph Nodes: The thoracic aorta is heavily calcified without aneurysm. Flow in the great vessels. No evidence for a mediastinal hematoma. Coronary artery calcifications. Small hiatal hernia. Lungs/Pleura: Trachea and mainstem bronchi are patent. Motion artifact. Lucency of the upper lungs and cannot exclude emphysema. Small air-filled collections along the medial left upper lobe adjacent to the descending thoracic aorta. These are new from the prior CT. Question a tiny nodular structure in the left upper lobe on sequence 5, image 30 which is probably stable. There is some atelectasis at both lung bases. Stable punctate nodule along the left major fissure on sequence 5, image 32. One or two small nodular densities in the superior segment of left lower lobe are new on sequence 5, image 31. Largest nodular density roughly measures 5 mm. Musculoskeletal: Fractures of left sixth, seventh, eighth, ninth and tenth ribs. No definite pneumothorax. Both shoulders are located. Visualized clavicles are intact. Prior  median sternotomy. No large pneumothorax. There is a compression fracture involving the T8 vertebral body. There is an anterior wedge deformity of the T8 vertebral body with approximately 60% loss of height along the anterior aspect. CT ABDOMEN PELVIS FINDINGS Hepatobiliary: Motion artifact on this exam limits evaluation. Small low-density structures in the liver could represent small cysts. There is high-density material in the gallbladder suggestive for stones. No significant biliary dilatation. Portal venous system appears to be patent. Pancreas: No gross abnormality to the pancreas. Spleen: No gross abnormality to the spleen. No evidence for perisplenic fluid. Adrenals/Urinary Tract: Mild fullness of left adrenal gland is probably stable. No gross abnormality to the right adrenal gland. Exophytic left renal cyst. Normal appearance of the urinary bladder. Mild fullness of the renal collecting systems bilaterally. Stomach/Bowel: Small hiatal hernia. There is no significant dilatation of the bowel structures and no evidence for a bowel obstruction. There is mesenteric edema in the left lower abdomen on sequence 4, image 87. This is a difficult area to evaluate due to motion artifact throughout the study. There may also be a small amount of mesenteric edema in the right lower abdomen. Vascular/Lymphatic: Vascular structures are heavily calcified. The infrarenal abdominal aorta measures up to 2.6 cm. No suspicious abdominal or pelvic lymphadenopathy. Reproductive: No gross abnormality to the prostate. Other: No evidence for free air. There is edema or a small amount of fluid in the left abdominal mesentery. Musculoskeletal: Again noted is heterogeneity of the in the left ilium related to Paget's disease and this is similar to the prior examination. There is a fracture of the left L4 transverse process. Fracture of  the left eleventh rib. There is also a compression deformity involving the L1 vertebral body which could  be old. Marked disc space loss at L5-S1. Bilateral pars defects at L5. Minimal anterolisthesis at L5-S1. IMPRESSION: Multiple left posterior rib fractures (6 through 11) without a pneumothorax. New small air-filled collections along the medial left upper lobe could represent traumatic pneumatoceles. Few new parenchymal nodular densities in left lower lobe are nonspecific but could be also related to recent trauma. Evaluation of the intra-abdominal structures is limited due to motion artifact but there is evidence for mesenteric edema and fluid, particularly in the left abdomen and left lower quadrant. Findings raise concern for a mesenteric injury. There is no evidence for free air. Fracture of the L4 left transverse process. Compression fractures involving T8 and L1. Suspect that the T8 fracture is acute but these are age-indeterminate. These results were called by telephone at the time of interpretation on 01/22/2016 at 8:01 pm to Dr. Quintella Reichert , who verbally acknowledged these results. Electronically Signed   By: Markus Daft M.D.   On: 01/22/2016 20:05   Ct Cervical Spine Wo Contrast  01/22/2016  CLINICAL DATA:  Fall from ladder with head injury. Right forehead and posterior head laceration. Prostate cancer. EXAM: CT HEAD WITHOUT CONTRAST CT CERVICAL SPINE WITHOUT CONTRAST TECHNIQUE: Multidetector CT imaging of the head and cervical spine was performed following the standard protocol without intravenous contrast. Multiplanar CT image reconstructions of the cervical spine were also generated. COMPARISON:  03/12/2014 PET-CT. FINDINGS: CT HEAD FINDINGS Moderate left parietal scalp contusion. Acute right frontotemporal convexity subdural hematoma measuring up to 4 mm in thickness along the right anterior falx. Acute subarachnoid hemorrhage throughout the right cerebral sulci, predominantly right inferior frontal and temporoparietal. Hemorrhagic cortical contusions/subarachnoid hemorrhage in the bilateral  anterior frontal lobes. Probable small amount of acute subarachnoid hemorrhage in the interpeduncular cistern. No mass lesion. No midline shift. Basilar cisterns remain patent. Intracranial atherosclerosis. Nonspecific mild subcortical and periventricular white matter hypodensity, most in keeping with chronic small vessel ischemic change. Cerebral volume is age appropriate. No ventriculomegaly. The visualized paranasal sinuses are essentially clear. The mastoid air cells are unopacified. No evidence of calvarial fracture. CT CERVICAL SPINE FINDINGS No fracture is detected in the cervical spine. No prevertebral soft tissue swelling. There is mild straightening of the cervical spine, usually due to positioning and/or muscle spasm. Dens is well positioned between the lateral masses of C1. The lateral masses appear well-aligned. Moderate degenerative disc disease in the mid to lower cervical spine, most prominent at C5-6. Mild-to-moderate bilateral facet arthropathy. No significant cervical foraminal stenosis. No cervical spine subluxation. Visualized mastoid air cells appear clear. No evidence of intra-axial hemorrhage in the visualized brain. No gross cervical canal hematoma. Centrilobular emphysema at the lung apices. No significant pulmonary nodules at the visualized lung apices. No cervical adenopathy or other significant neck soft tissue abnormality. IMPRESSION: 1. Moderate left parietal scalp contusion. 2. Acute right frontotemporal convexity subdural hematoma. 3. Acute subarachnoid hemorrhage throughout the right cerebral sulci. 4. Acute hemorrhagic cortical contusions/subarachnoid hemorrhage in the bilateral anterior frontal lobes. 5. No midline shift. Basilar cisterns remain patent. No ventriculomegaly. 6. Mild chronic small vessel ischemia. 7. No cervical spine fracture or subluxation. 8. Moderate cervical spine degenerative changes. Critical Value/emergent results were called by telephone at the time of  interpretation on 01/22/2016 at 7:52 pm to Dr. Quintella Reichert , who verbally acknowledged these results. Electronically Signed   By: Ilona Sorrel M.D.   On: 01/22/2016  19:53   Ct Abdomen Pelvis W Contrast  01/22/2016  CLINICAL DATA:  Fall with head hematoma. EXAM: CT CHEST, ABDOMEN, AND PELVIS WITH CONTRAST TECHNIQUE: Multidetector CT imaging of the chest, abdomen and pelvis was performed following the standard protocol during bolus administration of intravenous contrast. CONTRAST:  100 mL Isovue-300 COMPARISON:  03/20/2014 FINDINGS: CT CHEST FINDINGS Mediastinum/Lymph Nodes: The thoracic aorta is heavily calcified without aneurysm. Flow in the great vessels. No evidence for a mediastinal hematoma. Coronary artery calcifications. Small hiatal hernia. Lungs/Pleura: Trachea and mainstem bronchi are patent. Motion artifact. Lucency of the upper lungs and cannot exclude emphysema. Small air-filled collections along the medial left upper lobe adjacent to the descending thoracic aorta. These are new from the prior CT. Question a tiny nodular structure in the left upper lobe on sequence 5, image 30 which is probably stable. There is some atelectasis at both lung bases. Stable punctate nodule along the left major fissure on sequence 5, image 32. One or two small nodular densities in the superior segment of left lower lobe are new on sequence 5, image 31. Largest nodular density roughly measures 5 mm. Musculoskeletal: Fractures of left sixth, seventh, eighth, ninth and tenth ribs. No definite pneumothorax. Both shoulders are located. Visualized clavicles are intact. Prior median sternotomy. No large pneumothorax. There is a compression fracture involving the T8 vertebral body. There is an anterior wedge deformity of the T8 vertebral body with approximately 60% loss of height along the anterior aspect. CT ABDOMEN PELVIS FINDINGS Hepatobiliary: Motion artifact on this exam limits evaluation. Small low-density structures in  the liver could represent small cysts. There is high-density material in the gallbladder suggestive for stones. No significant biliary dilatation. Portal venous system appears to be patent. Pancreas: No gross abnormality to the pancreas. Spleen: No gross abnormality to the spleen. No evidence for perisplenic fluid. Adrenals/Urinary Tract: Mild fullness of left adrenal gland is probably stable. No gross abnormality to the right adrenal gland. Exophytic left renal cyst. Normal appearance of the urinary bladder. Mild fullness of the renal collecting systems bilaterally. Stomach/Bowel: Small hiatal hernia. There is no significant dilatation of the bowel structures and no evidence for a bowel obstruction. There is mesenteric edema in the left lower abdomen on sequence 4, image 87. This is a difficult area to evaluate due to motion artifact throughout the study. There may also be a small amount of mesenteric edema in the right lower abdomen. Vascular/Lymphatic: Vascular structures are heavily calcified. The infrarenal abdominal aorta measures up to 2.6 cm. No suspicious abdominal or pelvic lymphadenopathy. Reproductive: No gross abnormality to the prostate. Other: No evidence for free air. There is edema or a small amount of fluid in the left abdominal mesentery. Musculoskeletal: Again noted is heterogeneity of the in the left ilium related to Paget's disease and this is similar to the prior examination. There is a fracture of the left L4 transverse process. Fracture of the left eleventh rib. There is also a compression deformity involving the L1 vertebral body which could be old. Marked disc space loss at L5-S1. Bilateral pars defects at L5. Minimal anterolisthesis at L5-S1. IMPRESSION: Multiple left posterior rib fractures (6 through 11) without a pneumothorax. New small air-filled collections along the medial left upper lobe could represent traumatic pneumatoceles. Few new parenchymal nodular densities in left lower  lobe are nonspecific but could be also related to recent trauma. Evaluation of the intra-abdominal structures is limited due to motion artifact but there is evidence for mesenteric edema and fluid,  particularly in the left abdomen and left lower quadrant. Findings raise concern for a mesenteric injury. There is no evidence for free air. Fracture of the L4 left transverse process. Compression fractures involving T8 and L1. Suspect that the T8 fracture is acute but these are age-indeterminate. These results were called by telephone at the time of interpretation on 01/22/2016 at 8:01 pm to Dr. Quintella Reichert , who verbally acknowledged these results. Electronically Signed   By: Markus Daft M.D.   On: 01/22/2016 20:05   Dg Chest Port 1 View  01/23/2016  CLINICAL DATA:  Multiple rib fractures EXAM: PORTABLE CHEST 1 VIEW COMPARISON:  Portable chest x-ray and chest CT scan of Jan 22, 2016 FINDINGS: The lungs are adequately inflated. There is minimal bibasilar atelectasis which appears stable. A trace of pleural fluid on the left is suspected the interstitial markings on the left are slightly more conspicuous today. There is no pneumothorax or pneumomediastinum. The heart is top-normal in size. The pulmonary vascularity is normal. The mediastinum is normal in width. Known left posterior rib fractures are faintly visible. IMPRESSION: Persistent bibasilar atelectasis. Atelectasis. Mild interstitial prominence on the left has developed which may reflect mild edema of cardiac or noncardiac cause. There is no pneumothorax nor large pleural effusion. Electronically Signed   By: David  Martinique M.D.   On: 01/23/2016 07:49   Dg Chest Port 1 View  01/22/2016  CLINICAL DATA:  Fall from ladder with head injury. EXAM: PORTABLE CHEST 1 VIEW COMPARISON:  08/28/2009 chest radiograph FINDINGS: Sternotomy wires appear aligned and intact. Stable cardiomediastinal silhouette with normal heart size. No pneumothorax. No pleural effusion.  Bibasilar atelectasis. No pulmonary edema. Minimally displaced posterior left seventh and eighth rib fractures, which appear acute. IMPRESSION: 1. Minimally displaced acute posterior left seventh and eighth rib fractures. No pneumothorax . 2. Bibasilar atelectasis. Electronically Signed   By: Ilona Sorrel M.D.   On: 01/22/2016 18:30    Anti-infectives: Anti-infectives    None      Assessment/Plan: s/p  Advance diet Repeat head CT  Likely transfer out of the unit if CT head okay. To work with PT/OT   LOS: 1 day   Kathryne Eriksson. Dahlia Bailiff, MD, FACS [phone] Trauma Surgeon 01/23/2016

## 2016-01-23 NOTE — Progress Notes (Signed)
Orthopedic Tech Progress Note Patient Details:  Raymond Swanson July 09, 1925 [id number]  Ortho Devices Type of Ortho Device: Soft collar Ortho Device/Splint Interventions: Ordered, Application   Karolee Stamps 01/23/2016, 12:32 AM

## 2016-01-23 NOTE — Progress Notes (Signed)
Patient ID: Raymond Swanson, male   DOB: 02/12/1925, 82 y.o.   MRN: [id number] Awake alert not complaining of any headache no neck pain  Strength 5 out of 5 moves all extremities well  Mobilized today with physical therapy repeat head CT

## 2016-01-23 NOTE — Progress Notes (Signed)
PT Cancellation Note  Patient Details Name: Raymond Swanson MRN: [id number] DOB: 11-Jul-1925   Cancelled Treatment:    Reason Eval/Treat Not Completed: Patient at procedure or test/unavailable.  Pt at CT.  PT to check back later. Thanks,    Barbarann Ehlers. Nichols, Stephens, DPT [phone]   01/23/2016, 10:50 AM

## 2016-01-24 ENCOUNTER — Encounter (HOSPITAL_COMMUNITY): Payer: Self-pay | Admitting: *Deleted

## 2016-01-24 DIAGNOSIS — S065X9S Traumatic subdural hemorrhage with loss of consciousness of unspecified duration, sequela: Secondary | ICD-10-CM

## 2016-01-24 DIAGNOSIS — S2232XS Fracture of one rib, left side, sequela: Secondary | ICD-10-CM

## 2016-01-24 MED ORDER — OXYCODONE HCL 5 MG PO TABS
2.5000 mg | ORAL_TABLET | ORAL | Status: DC | PRN
  Administered 2016-01-24 – 2016-01-25 (×2): 5 mg via ORAL
  Filled 2016-01-24 (×2): qty 1

## 2016-01-24 MED ORDER — HYDROMORPHONE HCL 1 MG/ML IJ SOLN
0.5000 mg | INTRAMUSCULAR | Status: DC | PRN
  Administered 2016-01-25: 0.5 mg via INTRAVENOUS
  Filled 2016-01-24: qty 1

## 2016-01-24 NOTE — Progress Notes (Signed)
Physical Therapy Treatment Patient Details Name: Raymond Swanson MRN: [id number] DOB: [birth year] Today's Date: 01/24/2016    History of Present Illness 82 yo male with SDH and multiple L sided rib fractures from fall while cleaning gutters. CT on 6/1 showed Progression of right frontal and parietal subdural hematomas, Progression of left subdural hygroma, No shift of the midline structures, and Progression of right frontal hemorrhagic contusion.Pt with significant PMhx of CA (skin and prostate), anemia, CAD, HTN, and CABG.    PT Comments    Pt was able to get up and walk a short distance in room before HR and DOE limited his ability to go further.  He reports significant left rib pain, but was agreeable and participative throughout the session with encouragement.  He wants his pain to get better and to get back to his normal level of activity.  I continue to endorse CIR level therapies before d/c home.   Follow Up Recommendations  CIR     Equipment Recommendations  Rolling walker with 5" wheels    Recommendations for Other Services   NA     Precautions / Restrictions Precautions Precautions: Fall Precaution Comments: watch O2 Sats and HR Required Braces or Orthoses: Cervical Brace Cervical Brace: Soft collar    Mobility  Bed Mobility               General bed mobility comments: Pt has been in chair since we got up yesterday.  RN informed of skin tear on low back.  Pt sitting on wet pad as he likely spills urinal (he has been getting some urine in the urinal). Blue pressure relief pad placed in chair when pt returned.   Transfers Overall transfer level: Needs assistance Equipment used: Rolling walker (2 wheeled) Transfers: Sit to/from Stand Sit to Stand: +2 safety/equipment;Mod assist         General transfer comment: Two person mod assist to help stabilize RW and boost pt up to standing.  Verbal and maual cues for safe hand placement.    Ambulation/Gait Ambulation/Gait assistance: +2 safety/equipment;Min assist Ambulation Distance (Feet): 12 Feet Assistive device: Rolling walker (2 wheeled) Gait Pattern/deviations: Step-through pattern;Shuffle;Trunk flexed     General Gait Details: Pt with increased DOE and HR during short distance gait with RW.  HR max high 130s, O2 sats dropped to mid 70s (however, poor waveform on monitor).  DOE 3/4. Chair to follow for increased safety and to encrouage increased gait distance.           Balance Overall balance assessment: Needs assistance Sitting-balance support: Feet supported;Bilateral upper extremity supported Sitting balance-Leahy Scale: Fair     Standing balance support: Bilateral upper extremity supported Standing balance-Leahy Scale: Poor                      Cognition Arousal/Alertness: Awake/alert Behavior During Therapy: WFL for tasks assessed/performed Overall Cognitive Status: Difficult to assess                             Pertinent Vitals/Pain Pain Assessment: Faces Faces Pain Scale: Hurts whole lot Pain Location: left side chest/ribs Pain Descriptors / Indicators: Grimacing;Guarding Pain Intervention(s): Limited activity within patient's tolerance;Monitored during session;Repositioned           PT Goals (current goals can now be found in the care plan section) Acute Rehab PT Goals Patient Stated Goal: decrease pain Progress towards PT goals: Progressing toward goals  Frequency  Min 3X/week    PT Plan Current plan remains appropriate       End of Session   Activity Tolerance: Patient limited by pain;Treatment limited secondary to medical complications (Comment) (increased HR and DOE, possible decreased O2 sats) Patient left: in chair;with call bell/phone within reach;with chair alarm set     Time: [id number] PT Time Calculation (min) (ACUTE ONLY): 16 min  Charges:  $Gait Training: 8-22 mins            Raymond Swanson  B. [place], Victor, DPT [phone]   01/24/2016, 3:35 PM

## 2016-01-24 NOTE — Progress Notes (Signed)
I will follow up with pt and family on Monday to discuss a possible inpt rehab admission when medically ready. I have begun [employer] precertification. [id number]

## 2016-01-24 NOTE — Progress Notes (Signed)
Trauma Service Note  Subjective: Patient sitting up in a chair but having a lot of chest pain.  Does not want to move more.  Nurse asking for improved pain control   Objective: Vital signs in last 24 hours: Temp:  [97.4 F (36.3 C)-98.2 F (36.8 C)] 97.4 F (36.3 C) (06/02 0800) Pulse Rate:  [43-113] 69 (06/02 0900) Resp:  [10-22] 11 (06/02 0900) BP: (101-160)/(47-91) 118/68 mmHg (06/02 0900) SpO2:  [83 %-100 %] 100 % (06/02 0900) Last BM Date: 01/22/16  Intake/Output from previous day: 06/01 0701 - 06/02 0700 In: 930.1 [I.V.:930.1] Out: 550 [Urine:550] Intake/Output this shift: Total I/O In: 100 [I.V.:100] Out: -   General: Pain in chest is his biggest complaint.  Lungs: Clear, but shallow  Abd: Soft, good bowel sounds.  Abdomen is not tender  Extremities: No changes  Neuro: Intact, but there was slight worsening of head CT yesterday.  Urology:  Urine output has been marginal.  Lab Results: CBC   Recent Labs  01/22/16 1811 01/22/16 1824 01/23/16 0516  WBC 16.7*  --  11.8*  HGB 13.1 14.3 11.9*  HCT 41.3 42.0 37.3*  PLT 153  --  149*   BMET  Recent Labs  01/22/16 1811 01/22/16 1824 01/23/16 0516  NA 141 143 139  K 4.3 4.4 4.4  CL 107 103 107  CO2 28  --  28  GLUCOSE 149* 144* 183*  BUN 20 24* 18  CREATININE 1.19 1.10 1.07  CALCIUM 9.4  --  9.0   PT/INR  Recent Labs  01/22/16 1811  LABPROT 14.1  INR 1.07   ABG No results for input(s): PHART, HCO3 in the last 72 hours.  Invalid input(s): PCO2, PO2  Studies/Results: Ct Head Wo Contrast  01/23/2016  CLINICAL DATA:  Fall from ladder. Head injury. Follow-up subarachnoid hemorrhage. EXAM: CT HEAD WITHOUT CONTRAST TECHNIQUE: Contiguous axial images were obtained from the base of the skull through the vertex without intravenous contrast. COMPARISON:  CT head 01/22/2016 FINDINGS: Progression of subarachnoid hemorrhage in the right frontal and parietal lobes. Small amount of subarachnoid  hemorrhage left frontal region is unchanged. Mild left parietal subarachnoid hemorrhage has progressed. Hemorrhagic contusion right inferior frontal lobe has progressed in the interval. Small extra-axial CSF hygroma on the left is slightly larger. Right frontal subdural hematoma slightly larger. Right parietal subdural hematoma larger than the prior study. There is increased density and thickness of the right parietal subdural hematoma which now measures up to 11 mm in thickness. Small interhemispheric subdural hematoma. Minimal subdural along the right tentorium. Moderate atrophy. No shift of the midline structures. No acute infarct or mass. Nondisplaced skull fracture involving the left temporal and parietal lobe is unchanged. This is best seen on the coronal and sagittal reformations and is not seen on the axial images due to the plane of the fracture. IMPRESSION: Interval progression of subarachnoid hemorrhage on the right. Progression of right frontal and parietal subdural hematomas. Progression of left subdural hygroma. No shift of the midline structures. Progression of right frontal hemorrhagic contusion Nondisplaced left temporal parietal skull fracture. See coronal and sagittal reformations. Electronically Signed   By: Franchot Gallo M.D.   On: 01/23/2016 10:51   Ct Head Wo Contrast  01/22/2016  CLINICAL DATA:  Fall from ladder with head injury. Right forehead and posterior head laceration. Prostate cancer. EXAM: CT HEAD WITHOUT CONTRAST CT CERVICAL SPINE WITHOUT CONTRAST TECHNIQUE: Multidetector CT imaging of the head and cervical spine was performed following the standard protocol  without intravenous contrast. Multiplanar CT image reconstructions of the cervical spine were also generated. COMPARISON:  03/12/2014 PET-CT. FINDINGS: CT HEAD FINDINGS Moderate left parietal scalp contusion. Acute right frontotemporal convexity subdural hematoma measuring up to 4 mm in thickness along the right anterior  falx. Acute subarachnoid hemorrhage throughout the right cerebral sulci, predominantly right inferior frontal and temporoparietal. Hemorrhagic cortical contusions/subarachnoid hemorrhage in the bilateral anterior frontal lobes. Probable small amount of acute subarachnoid hemorrhage in the interpeduncular cistern. No mass lesion. No midline shift. Basilar cisterns remain patent. Intracranial atherosclerosis. Nonspecific mild subcortical and periventricular white matter hypodensity, most in keeping with chronic small vessel ischemic change. Cerebral volume is age appropriate. No ventriculomegaly. The visualized paranasal sinuses are essentially clear. The mastoid air cells are unopacified. No evidence of calvarial fracture. CT CERVICAL SPINE FINDINGS No fracture is detected in the cervical spine. No prevertebral soft tissue swelling. There is mild straightening of the cervical spine, usually due to positioning and/or muscle spasm. Dens is well positioned between the lateral masses of C1. The lateral masses appear well-aligned. Moderate degenerative disc disease in the mid to lower cervical spine, most prominent at C5-6. Mild-to-moderate bilateral facet arthropathy. No significant cervical foraminal stenosis. No cervical spine subluxation. Visualized mastoid air cells appear clear. No evidence of intra-axial hemorrhage in the visualized brain. No gross cervical canal hematoma. Centrilobular emphysema at the lung apices. No significant pulmonary nodules at the visualized lung apices. No cervical adenopathy or other significant neck soft tissue abnormality. IMPRESSION: 1. Moderate left parietal scalp contusion. 2. Acute right frontotemporal convexity subdural hematoma. 3. Acute subarachnoid hemorrhage throughout the right cerebral sulci. 4. Acute hemorrhagic cortical contusions/subarachnoid hemorrhage in the bilateral anterior frontal lobes. 5. No midline shift. Basilar cisterns remain patent. No ventriculomegaly. 6.  Mild chronic small vessel ischemia. 7. No cervical spine fracture or subluxation. 8. Moderate cervical spine degenerative changes. Critical Value/emergent results were called by telephone at the time of interpretation on 01/22/2016 at 7:52 pm to Dr. Quintella Reichert , who verbally acknowledged these results. Electronically Signed   By: Ilona Sorrel M.D.   On: 01/22/2016 19:53   Ct Chest W Contrast  01/22/2016  CLINICAL DATA:  Fall with head hematoma. EXAM: CT CHEST, ABDOMEN, AND PELVIS WITH CONTRAST TECHNIQUE: Multidetector CT imaging of the chest, abdomen and pelvis was performed following the standard protocol during bolus administration of intravenous contrast. CONTRAST:  100 mL Isovue-300 COMPARISON:  03/20/2014 FINDINGS: CT CHEST FINDINGS Mediastinum/Lymph Nodes: The thoracic aorta is heavily calcified without aneurysm. Flow in the great vessels. No evidence for a mediastinal hematoma. Coronary artery calcifications. Small hiatal hernia. Lungs/Pleura: Trachea and mainstem bronchi are patent. Motion artifact. Lucency of the upper lungs and cannot exclude emphysema. Small air-filled collections along the medial left upper lobe adjacent to the descending thoracic aorta. These are new from the prior CT. Question a tiny nodular structure in the left upper lobe on sequence 5, image 30 which is probably stable. There is some atelectasis at both lung bases. Stable punctate nodule along the left major fissure on sequence 5, image 32. One or two small nodular densities in the superior segment of left lower lobe are new on sequence 5, image 31. Largest nodular density roughly measures 5 mm. Musculoskeletal: Fractures of left sixth, seventh, eighth, ninth and tenth ribs. No definite pneumothorax. Both shoulders are located. Visualized clavicles are intact. Prior median sternotomy. No large pneumothorax. There is a compression fracture involving the T8 vertebral body. There is an anterior wedge deformity of the  T8 vertebral  body with approximately 60% loss of height along the anterior aspect. CT ABDOMEN PELVIS FINDINGS Hepatobiliary: Motion artifact on this exam limits evaluation. Small low-density structures in the liver could represent small cysts. There is high-density material in the gallbladder suggestive for stones. No significant biliary dilatation. Portal venous system appears to be patent. Pancreas: No gross abnormality to the pancreas. Spleen: No gross abnormality to the spleen. No evidence for perisplenic fluid. Adrenals/Urinary Tract: Mild fullness of left adrenal gland is probably stable. No gross abnormality to the right adrenal gland. Exophytic left renal cyst. Normal appearance of the urinary bladder. Mild fullness of the renal collecting systems bilaterally. Stomach/Bowel: Small hiatal hernia. There is no significant dilatation of the bowel structures and no evidence for a bowel obstruction. There is mesenteric edema in the left lower abdomen on sequence 4, image 87. This is a difficult area to evaluate due to motion artifact throughout the study. There may also be a small amount of mesenteric edema in the right lower abdomen. Vascular/Lymphatic: Vascular structures are heavily calcified. The infrarenal abdominal aorta measures up to 2.6 cm. No suspicious abdominal or pelvic lymphadenopathy. Reproductive: No gross abnormality to the prostate. Other: No evidence for free air. There is edema or a small amount of fluid in the left abdominal mesentery. Musculoskeletal: Again noted is heterogeneity of the in the left ilium related to Paget's disease and this is similar to the prior examination. There is a fracture of the left L4 transverse process. Fracture of the left eleventh rib. There is also a compression deformity involving the L1 vertebral body which could be old. Marked disc space loss at L5-S1. Bilateral pars defects at L5. Minimal anterolisthesis at L5-S1. IMPRESSION: Multiple left posterior rib fractures (6  through 11) without a pneumothorax. New small air-filled collections along the medial left upper lobe could represent traumatic pneumatoceles. Few new parenchymal nodular densities in left lower lobe are nonspecific but could be also related to recent trauma. Evaluation of the intra-abdominal structures is limited due to motion artifact but there is evidence for mesenteric edema and fluid, particularly in the left abdomen and left lower quadrant. Findings raise concern for a mesenteric injury. There is no evidence for free air. Fracture of the L4 left transverse process. Compression fractures involving T8 and L1. Suspect that the T8 fracture is acute but these are age-indeterminate. These results were called by telephone at the time of interpretation on 01/22/2016 at 8:01 pm to Dr. Quintella Reichert , who verbally acknowledged these results. Electronically Signed   By: Markus Daft M.D.   On: 01/22/2016 20:05   Ct Cervical Spine Wo Contrast  01/22/2016  CLINICAL DATA:  Fall from ladder with head injury. Right forehead and posterior head laceration. Prostate cancer. EXAM: CT HEAD WITHOUT CONTRAST CT CERVICAL SPINE WITHOUT CONTRAST TECHNIQUE: Multidetector CT imaging of the head and cervical spine was performed following the standard protocol without intravenous contrast. Multiplanar CT image reconstructions of the cervical spine were also generated. COMPARISON:  03/12/2014 PET-CT. FINDINGS: CT HEAD FINDINGS Moderate left parietal scalp contusion. Acute right frontotemporal convexity subdural hematoma measuring up to 4 mm in thickness along the right anterior falx. Acute subarachnoid hemorrhage throughout the right cerebral sulci, predominantly right inferior frontal and temporoparietal. Hemorrhagic cortical contusions/subarachnoid hemorrhage in the bilateral anterior frontal lobes. Probable small amount of acute subarachnoid hemorrhage in the interpeduncular cistern. No mass lesion. No midline shift. Basilar cisterns  remain patent. Intracranial atherosclerosis. Nonspecific mild subcortical and periventricular white matter hypodensity,  most in keeping with chronic small vessel ischemic change. Cerebral volume is age appropriate. No ventriculomegaly. The visualized paranasal sinuses are essentially clear. The mastoid air cells are unopacified. No evidence of calvarial fracture. CT CERVICAL SPINE FINDINGS No fracture is detected in the cervical spine. No prevertebral soft tissue swelling. There is mild straightening of the cervical spine, usually due to positioning and/or muscle spasm. Dens is well positioned between the lateral masses of C1. The lateral masses appear well-aligned. Moderate degenerative disc disease in the mid to lower cervical spine, most prominent at C5-6. Mild-to-moderate bilateral facet arthropathy. No significant cervical foraminal stenosis. No cervical spine subluxation. Visualized mastoid air cells appear clear. No evidence of intra-axial hemorrhage in the visualized brain. No gross cervical canal hematoma. Centrilobular emphysema at the lung apices. No significant pulmonary nodules at the visualized lung apices. No cervical adenopathy or other significant neck soft tissue abnormality. IMPRESSION: 1. Moderate left parietal scalp contusion. 2. Acute right frontotemporal convexity subdural hematoma. 3. Acute subarachnoid hemorrhage throughout the right cerebral sulci. 4. Acute hemorrhagic cortical contusions/subarachnoid hemorrhage in the bilateral anterior frontal lobes. 5. No midline shift. Basilar cisterns remain patent. No ventriculomegaly. 6. Mild chronic small vessel ischemia. 7. No cervical spine fracture or subluxation. 8. Moderate cervical spine degenerative changes. Critical Value/emergent results were called by telephone at the time of interpretation on 01/22/2016 at 7:52 pm to Dr. Quintella Reichert , who verbally acknowledged these results. Electronically Signed   By: Ilona Sorrel M.D.   On: 01/22/2016  19:53   Ct Abdomen Pelvis W Contrast  01/22/2016  CLINICAL DATA:  Fall with head hematoma. EXAM: CT CHEST, ABDOMEN, AND PELVIS WITH CONTRAST TECHNIQUE: Multidetector CT imaging of the chest, abdomen and pelvis was performed following the standard protocol during bolus administration of intravenous contrast. CONTRAST:  100 mL Isovue-300 COMPARISON:  03/20/2014 FINDINGS: CT CHEST FINDINGS Mediastinum/Lymph Nodes: The thoracic aorta is heavily calcified without aneurysm. Flow in the great vessels. No evidence for a mediastinal hematoma. Coronary artery calcifications. Small hiatal hernia. Lungs/Pleura: Trachea and mainstem bronchi are patent. Motion artifact. Lucency of the upper lungs and cannot exclude emphysema. Small air-filled collections along the medial left upper lobe adjacent to the descending thoracic aorta. These are new from the prior CT. Question a tiny nodular structure in the left upper lobe on sequence 5, image 30 which is probably stable. There is some atelectasis at both lung bases. Stable punctate nodule along the left major fissure on sequence 5, image 32. One or two small nodular densities in the superior segment of left lower lobe are new on sequence 5, image 31. Largest nodular density roughly measures 5 mm. Musculoskeletal: Fractures of left sixth, seventh, eighth, ninth and tenth ribs. No definite pneumothorax. Both shoulders are located. Visualized clavicles are intact. Prior median sternotomy. No large pneumothorax. There is a compression fracture involving the T8 vertebral body. There is an anterior wedge deformity of the T8 vertebral body with approximately 60% loss of height along the anterior aspect. CT ABDOMEN PELVIS FINDINGS Hepatobiliary: Motion artifact on this exam limits evaluation. Small low-density structures in the liver could represent small cysts. There is high-density material in the gallbladder suggestive for stones. No significant biliary dilatation. Portal venous system  appears to be patent. Pancreas: No gross abnormality to the pancreas. Spleen: No gross abnormality to the spleen. No evidence for perisplenic fluid. Adrenals/Urinary Tract: Mild fullness of left adrenal gland is probably stable. No gross abnormality to the right adrenal gland. Exophytic left renal cyst. Normal  appearance of the urinary bladder. Mild fullness of the renal collecting systems bilaterally. Stomach/Bowel: Small hiatal hernia. There is no significant dilatation of the bowel structures and no evidence for a bowel obstruction. There is mesenteric edema in the left lower abdomen on sequence 4, image 87. This is a difficult area to evaluate due to motion artifact throughout the study. There may also be a small amount of mesenteric edema in the right lower abdomen. Vascular/Lymphatic: Vascular structures are heavily calcified. The infrarenal abdominal aorta measures up to 2.6 cm. No suspicious abdominal or pelvic lymphadenopathy. Reproductive: No gross abnormality to the prostate. Other: No evidence for free air. There is edema or a small amount of fluid in the left abdominal mesentery. Musculoskeletal: Again noted is heterogeneity of the in the left ilium related to Paget's disease and this is similar to the prior examination. There is a fracture of the left L4 transverse process. Fracture of the left eleventh rib. There is also a compression deformity involving the L1 vertebral body which could be old. Marked disc space loss at L5-S1. Bilateral pars defects at L5. Minimal anterolisthesis at L5-S1. IMPRESSION: Multiple left posterior rib fractures (6 through 11) without a pneumothorax. New small air-filled collections along the medial left upper lobe could represent traumatic pneumatoceles. Few new parenchymal nodular densities in left lower lobe are nonspecific but could be also related to recent trauma. Evaluation of the intra-abdominal structures is limited due to motion artifact but there is evidence for  mesenteric edema and fluid, particularly in the left abdomen and left lower quadrant. Findings raise concern for a mesenteric injury. There is no evidence for free air. Fracture of the L4 left transverse process. Compression fractures involving T8 and L1. Suspect that the T8 fracture is acute but these are age-indeterminate. These results were called by telephone at the time of interpretation on 01/22/2016 at 8:01 pm to Dr. Quintella Reichert , who verbally acknowledged these results. Electronically Signed   By: Markus Daft M.D.   On: 01/22/2016 20:05   Dg Chest Port 1 View  01/23/2016  CLINICAL DATA:  Multiple rib fractures EXAM: PORTABLE CHEST 1 VIEW COMPARISON:  Portable chest x-ray and chest CT scan of Jan 22, 2016 FINDINGS: The lungs are adequately inflated. There is minimal bibasilar atelectasis which appears stable. A trace of pleural fluid on the left is suspected the interstitial markings on the left are slightly more conspicuous today. There is no pneumothorax or pneumomediastinum. The heart is top-normal in size. The pulmonary vascularity is normal. The mediastinum is normal in width. Known left posterior rib fractures are faintly visible. IMPRESSION: Persistent bibasilar atelectasis. Atelectasis. Mild interstitial prominence on the left has developed which may reflect mild edema of cardiac or noncardiac cause. There is no pneumothorax nor large pleural effusion. Electronically Signed   By: David  Martinique M.D.   On: 01/23/2016 07:49   Dg Chest Port 1 View  01/22/2016  CLINICAL DATA:  Fall from ladder with head injury. EXAM: PORTABLE CHEST 1 VIEW COMPARISON:  08/28/2009 chest radiograph FINDINGS: Sternotomy wires appear aligned and intact. Stable cardiomediastinal silhouette with normal heart size. No pneumothorax. No pleural effusion. Bibasilar atelectasis. No pulmonary edema. Minimally displaced posterior left seventh and eighth rib fractures, which appear acute. IMPRESSION: 1. Minimally displaced acute  posterior left seventh and eighth rib fractures. No pneumothorax . 2. Bibasilar atelectasis. Electronically Signed   By: Ilona Sorrel M.D.   On: 01/22/2016 18:30    Anti-infectives: Anti-infectives    None  Assessment/Plan: s/p  Advance diet Continue PT/OT.  Improve pain control  Rehab consultation has been suggested. I have added oxy IR 2.5-5 q4h as needed for pain control Do not want to use Toradol  LOS: 2 days   Kathryne Eriksson. Dahlia Bailiff, MD, FACS [phone] Trauma Surgeon 01/24/2016

## 2016-01-24 NOTE — Progress Notes (Signed)
Patient ID: Raymond Swanson, male   DOB: [birth year], 82 y.o.   MRN: [id number] No headache on the complaints are left side chest wall pain  Awake alert oriented neurologically nonfocal  Repeat CT scan showed some evolution of is subarachnoid and small subdural but still no mass effect no surgical intervention needed at this time.

## 2016-01-24 NOTE — Care Management Important Message (Signed)
Important Message  Patient Details  Name: Raymond Swanson MRN: [id number] Date of Birth: 1925-01-23   Medicare Important Message Given:  Yes    Loann Quill 01/24/2016, 9:32 AM

## 2016-01-24 NOTE — Consult Note (Signed)
Physical Medicine and Rehabilitation Consult Reason for Consult: Traumatic Subdural hematoma after fall Referring Physician: Trauma   HPI: Raymond Swanson is a 82 y.o. right handed male with history of hypertension, prostate cancer, CAD with CABG. Per chart review patient lives with 42 year old wife. Used to single-point cane prior to admission and remained very active. Presented 01/22/2016 after a fall while cleaning some gutters. Questionable loss of consciousness. Patient completely amnesic for the event. By report patient was down for 3-4 hours before being found. CT of the head images revealed moderate left parietal scalp contusion, acute right frontotemporal subdural hematoma measuring up to 4 mm, acute subarachnoid hemorrhage throughout the right cerebral sulci. Hemorrhagic cortical contusions and subarachnoid hemorrhage bilateral anterior frontal lobes. No midline shift. Neurosurgery Dr. Saintclair Halsted advise conservative care. CT of the chest showed multiple left posterior rib fractures without pneumothorax as well as fracture of the L4 left transverse process. Compression fractures T8 and L1 suspect T8 fracture or acute but age-indeterminate.. CT cervical spine negative. Follow-up CT of the head showed interval progression of subarachnoid hemorrhage on the right and continued to monitor. Maintain on a regular diet. Hospital course pain management. Physical occupational therapy evaluation completed 01/23/2016.   Review of Systems  Constitutional: Negative for fever and chills.  HENT: Positive for hearing loss.   Eyes: Negative for blurred vision and double vision.  Respiratory: Positive for shortness of breath. Negative for cough.   Cardiovascular: Positive for leg swelling. Negative for chest pain and palpitations.  Gastrointestinal: Positive for constipation. Negative for nausea and vomiting.  Genitourinary: Positive for urgency. Negative for dysuria and hematuria.  Musculoskeletal:  Positive for myalgias, joint pain and falls.  Skin: Negative for rash.  Neurological: Positive for weakness and headaches. Negative for seizures.  All other systems reviewed and are negative.  Past Medical History  Diagnosis Date  . Cancer ([facility])   . ACUT GASTR ULCER W/HEMORR W/O MENTION OBST 09/10/2009  . ANEMIA 10/08/2009  . CORONARY ARTERY DISEASE 01/26/2007  . HYPERLIPIDEMIA 01/26/2007  . HYPERTENSION 01/26/2007  . HYPOTHYROIDISM 10/18/2007  . MELENA 08/28/2009  . PROSTATE CANCER, HX OF 01/26/2007  . SKIN CANCER, HX OF 01/26/2007   Past Surgical History  Procedure Laterality Date  . Coronary artery bypass graft      1991  . Hernia repair      ingunial  . Prostate surgery      prostatectomy  . Mohs surgery    . Cataract extraction     Family History  Problem Relation Age of Onset  . Lung cancer Brother 58  . GI problems Sister    Social History:  reports that he quit smoking about 67 years ago. His smoking use included Cigarettes. He has a 20 pack-year smoking history. He has never used smokeless tobacco. He reports that he drinks alcohol. He reports that he does not use illicit drugs. Allergies: No Known Allergies Medications Prior to Admission  Medication Sig Dispense Refill  . aspirin 81 MG tablet Take 81 mg by mouth daily.      . calcium gluconate 500 MG tablet Take 500 mg by mouth daily.      . cholecalciferol (VITAMIN D) 1000 UNITS tablet Take 1,000 Units by mouth daily.      . diphenhydramine-acetaminophen (TYLENOL PM) 25-500 MG TABS Take 1 tablet by mouth at bedtime as needed (sleep).     . fluorouracil (EFUDEX) 5 % cream Apply topically 2 (two) times daily. 40 g 2  .  levothyroxine (SYNTHROID, LEVOTHROID) 50 MCG tablet Take 1 tablet (50 mcg total) by mouth daily. 90 tablet 3  . Melatonin 5 MG TABS Take 2.5 mg by mouth at bedtime.    . Multiple Vitamins-Minerals (PRESERVISION/LUTEIN PO) Take by mouth. Daily       . nitroGLYCERIN (NITROSTAT) 0.4 MG SL tablet Place 1 tablet  (0.4 mg total) under the tongue every 5 (five) minutes as needed. 20 tablet 5  . Omega-3 Fatty Acids (OMEGA-3 FISH OIL PO) Take by mouth daily.    . simvastatin (ZOCOR) 20 MG tablet Take 1 tablet (20 mg total) by mouth daily. 90 tablet 3    Home: Home Living Family/patient expects to be discharged to:: Private residence Living Arrangements: Spouse/significant other (5 years younger and very active/independent) Available Help at Discharge: Family, Available 24 hours/day Type of Home: House Home Access: Stairs to enter Technical brewer of Steps: 2 Entrance Stairs-Rails: Right Home Layout: One level Bathroom Shower/Tub: Multimedia programmer: Standard Home Equipment: Cane - single point  Functional History: Prior Function Level of Independence: Independent Comments: Walked with his dog every day, likes to garden, wife likes to bowl Functional Status:  Mobility: Bed Mobility Overal bed mobility: Needs Assistance Bed Mobility: Supine to Sit Supine to sit: Min assist, HOB elevated General bed mobility comments: Min assist to support trunk during transition to sit and provide a hand to pull up.  Pt managing his legs on his own.  Transfers Overall transfer level: Needs assistance Equipment used: 1 person hand held assist Transfers: Sit to/from Stand, Stand Pivot Transfers Sit to Stand: Min assist Stand pivot transfers: Min assist General transfer comment: Heavy reliance on hands for support and only able to stand and pivot to the chair as he was significantly limited by rib pain.       ADL: ADL Overall ADL's : Needs assistance/impaired Eating/Feeding: Set up, Sitting Grooming: Set up, Supervision/safety, Sitting Upper Body Bathing: Minimal assitance, Sitting Lower Body Bathing: Moderate assistance, Sit to/from stand Upper Body Dressing : Minimal assistance, Sitting Lower Body Dressing: Moderate assistance, Sit to/from stand Lower Body Dressing Details (indicate  cue type and reason): Pt with difficulty managing LB ADLs secondary to pain in L ribs. Functional mobility during ADLs: Minimal assistance (for sit to stand only) General ADL Comments: Pt very HOH making communication difficult. Pt reports significant pain in L ribs with sit to stand from chair, limiting participation in standing ADL activities. SpO2 jumping from low 60s up to mid 90s with poor waveform; instructed in deep breathing and pursed lip breathing but pt reports pain with both.  Cognition: Cognition Overall Cognitive Status: Within Functional Limits for tasks assessed (A&O x 4, doesn't remember the actual fall) Orientation Level: Oriented X4 Cognition Arousal/Alertness: Awake/alert Behavior During Therapy: WFL for tasks assessed/performed Overall Cognitive Status: Within Functional Limits for tasks assessed (A&O x 4, doesn't remember the actual fall) Difficult to assess due to: Hard of hearing/deaf  Blood pressure 125/101, pulse 102, temperature 98.2 F (36.8 C), temperature source Oral, resp. rate 25, height 5\' 8"  (1.727 m), weight 62.4 kg (137 lb 9.1 oz), SpO2 100 %. Physical Exam  Constitutional:  Frail 82 year old right-handed male  HENT:  Healing laceration to right scalp  Eyes: EOM are normal.  Neck: Normal range of motion. Neck supple. No thyromegaly present.  Cardiovascular: Regular rhythm.   Respiratory:  Limited inspiratory effort clear to auscultation  GI: Soft. Bowel sounds are normal. He exhibits no distension.  Neurological: He is alert.  Patient is very hard of hearing. Follows simple commands. Oriented to person and place as well as age. RUE 4/5. LUE 3-4/5 and limited due to pain. LE's 4/5 HF,KE and 4+ ADF/PF. No gross sensory abnl.   Skin: Skin is warm and dry.  Psychiatric:  Pleasant, calm and cooperative    No results found for this or any previous visit (from the past 24 hour(s)). Ct Head Wo Contrast  01/23/2016  CLINICAL DATA:  Fall from ladder. Head  injury. Follow-up subarachnoid hemorrhage. EXAM: CT HEAD WITHOUT CONTRAST TECHNIQUE: Contiguous axial images were obtained from the base of the skull through the vertex without intravenous contrast. COMPARISON:  CT head 01/22/2016 FINDINGS: Progression of subarachnoid hemorrhage in the right frontal and parietal lobes. Small amount of subarachnoid hemorrhage left frontal region is unchanged. Mild left parietal subarachnoid hemorrhage has progressed. Hemorrhagic contusion right inferior frontal lobe has progressed in the interval. Small extra-axial CSF hygroma on the left is slightly larger. Right frontal subdural hematoma slightly larger. Right parietal subdural hematoma larger than the prior study. There is increased density and thickness of the right parietal subdural hematoma which now measures up to 11 mm in thickness. Small interhemispheric subdural hematoma. Minimal subdural along the right tentorium. Moderate atrophy. No shift of the midline structures. No acute infarct or mass. Nondisplaced skull fracture involving the left temporal and parietal lobe is unchanged. This is best seen on the coronal and sagittal reformations and is not seen on the axial images due to the plane of the fracture. IMPRESSION: Interval progression of subarachnoid hemorrhage on the right. Progression of right frontal and parietal subdural hematomas. Progression of left subdural hygroma. No shift of the midline structures. Progression of right frontal hemorrhagic contusion Nondisplaced left temporal parietal skull fracture. See coronal and sagittal reformations. Electronically Signed   By: Franchot Gallo M.D.   On: 01/23/2016 10:51   Ct Head Wo Contrast  01/22/2016  CLINICAL DATA:  Fall from ladder with head injury. Right forehead and posterior head laceration. Prostate cancer. EXAM: CT HEAD WITHOUT CONTRAST CT CERVICAL SPINE WITHOUT CONTRAST TECHNIQUE: Multidetector CT imaging of the head and cervical spine was performed following  the standard protocol without intravenous contrast. Multiplanar CT image reconstructions of the cervical spine were also generated. COMPARISON:  03/12/2014 PET-CT. FINDINGS: CT HEAD FINDINGS Moderate left parietal scalp contusion. Acute right frontotemporal convexity subdural hematoma measuring up to 4 mm in thickness along the right anterior falx. Acute subarachnoid hemorrhage throughout the right cerebral sulci, predominantly right inferior frontal and temporoparietal. Hemorrhagic cortical contusions/subarachnoid hemorrhage in the bilateral anterior frontal lobes. Probable small amount of acute subarachnoid hemorrhage in the interpeduncular cistern. No mass lesion. No midline shift. Basilar cisterns remain patent. Intracranial atherosclerosis. Nonspecific mild subcortical and periventricular white matter hypodensity, most in keeping with chronic small vessel ischemic change. Cerebral volume is age appropriate. No ventriculomegaly. The visualized paranasal sinuses are essentially clear. The mastoid air cells are unopacified. No evidence of calvarial fracture. CT CERVICAL SPINE FINDINGS No fracture is detected in the cervical spine. No prevertebral soft tissue swelling. There is mild straightening of the cervical spine, usually due to positioning and/or muscle spasm. Dens is well positioned between the lateral masses of C1. The lateral masses appear well-aligned. Moderate degenerative disc disease in the mid to lower cervical spine, most prominent at C5-6. Mild-to-moderate bilateral facet arthropathy. No significant cervical foraminal stenosis. No cervical spine subluxation. Visualized mastoid air cells appear clear. No evidence of intra-axial hemorrhage in the visualized brain. No gross  cervical canal hematoma. Centrilobular emphysema at the lung apices. No significant pulmonary nodules at the visualized lung apices. No cervical adenopathy or other significant neck soft tissue abnormality. IMPRESSION: 1. Moderate  left parietal scalp contusion. 2. Acute right frontotemporal convexity subdural hematoma. 3. Acute subarachnoid hemorrhage throughout the right cerebral sulci. 4. Acute hemorrhagic cortical contusions/subarachnoid hemorrhage in the bilateral anterior frontal lobes. 5. No midline shift. Basilar cisterns remain patent. No ventriculomegaly. 6. Mild chronic small vessel ischemia. 7. No cervical spine fracture or subluxation. 8. Moderate cervical spine degenerative changes. Critical Value/emergent results were called by telephone at the time of interpretation on 01/22/2016 at 7:52 pm to Dr. Quintella Reichert , who verbally acknowledged these results. Electronically Signed   By: Ilona Sorrel M.D.   On: 01/22/2016 19:53   Ct Chest W Contrast  01/22/2016  CLINICAL DATA:  Fall with head hematoma. EXAM: CT CHEST, ABDOMEN, AND PELVIS WITH CONTRAST TECHNIQUE: Multidetector CT imaging of the chest, abdomen and pelvis was performed following the standard protocol during bolus administration of intravenous contrast. CONTRAST:  100 mL Isovue-300 COMPARISON:  03/20/2014 FINDINGS: CT CHEST FINDINGS Mediastinum/Lymph Nodes: The thoracic aorta is heavily calcified without aneurysm. Flow in the great vessels. No evidence for a mediastinal hematoma. Coronary artery calcifications. Small hiatal hernia. Lungs/Pleura: Trachea and mainstem bronchi are patent. Motion artifact. Lucency of the upper lungs and cannot exclude emphysema. Small air-filled collections along the medial left upper lobe adjacent to the descending thoracic aorta. These are new from the prior CT. Question a tiny nodular structure in the left upper lobe on sequence 5, image 30 which is probably stable. There is some atelectasis at both lung bases. Stable punctate nodule along the left major fissure on sequence 5, image 32. One or two small nodular densities in the superior segment of left lower lobe are new on sequence 5, image 31. Largest nodular density roughly measures  5 mm. Musculoskeletal: Fractures of left sixth, seventh, eighth, ninth and tenth ribs. No definite pneumothorax. Both shoulders are located. Visualized clavicles are intact. Prior median sternotomy. No large pneumothorax. There is a compression fracture involving the T8 vertebral body. There is an anterior wedge deformity of the T8 vertebral body with approximately 60% loss of height along the anterior aspect. CT ABDOMEN PELVIS FINDINGS Hepatobiliary: Motion artifact on this exam limits evaluation. Small low-density structures in the liver could represent small cysts. There is high-density material in the gallbladder suggestive for stones. No significant biliary dilatation. Portal venous system appears to be patent. Pancreas: No gross abnormality to the pancreas. Spleen: No gross abnormality to the spleen. No evidence for perisplenic fluid. Adrenals/Urinary Tract: Mild fullness of left adrenal gland is probably stable. No gross abnormality to the right adrenal gland. Exophytic left renal cyst. Normal appearance of the urinary bladder. Mild fullness of the renal collecting systems bilaterally. Stomach/Bowel: Small hiatal hernia. There is no significant dilatation of the bowel structures and no evidence for a bowel obstruction. There is mesenteric edema in the left lower abdomen on sequence 4, image 87. This is a difficult area to evaluate due to motion artifact throughout the study. There may also be a small amount of mesenteric edema in the right lower abdomen. Vascular/Lymphatic: Vascular structures are heavily calcified. The infrarenal abdominal aorta measures up to 2.6 cm. No suspicious abdominal or pelvic lymphadenopathy. Reproductive: No gross abnormality to the prostate. Other: No evidence for free air. There is edema or a small amount of fluid in the left abdominal mesentery. Musculoskeletal: Again noted  is heterogeneity of the in the left ilium related to Paget's disease and this is similar to the prior  examination. There is a fracture of the left L4 transverse process. Fracture of the left eleventh rib. There is also a compression deformity involving the L1 vertebral body which could be old. Marked disc space loss at L5-S1. Bilateral pars defects at L5. Minimal anterolisthesis at L5-S1. IMPRESSION: Multiple left posterior rib fractures (6 through 11) without a pneumothorax. New small air-filled collections along the medial left upper lobe could represent traumatic pneumatoceles. Few new parenchymal nodular densities in left lower lobe are nonspecific but could be also related to recent trauma. Evaluation of the intra-abdominal structures is limited due to motion artifact but there is evidence for mesenteric edema and fluid, particularly in the left abdomen and left lower quadrant. Findings raise concern for a mesenteric injury. There is no evidence for free air. Fracture of the L4 left transverse process. Compression fractures involving T8 and L1. Suspect that the T8 fracture is acute but these are age-indeterminate. These results were called by telephone at the time of interpretation on 01/22/2016 at 8:01 pm to Dr. Quintella Reichert , who verbally acknowledged these results. Electronically Signed   By: Markus Daft M.D.   On: 01/22/2016 20:05   Ct Cervical Spine Wo Contrast  01/22/2016  CLINICAL DATA:  Fall from ladder with head injury. Right forehead and posterior head laceration. Prostate cancer. EXAM: CT HEAD WITHOUT CONTRAST CT CERVICAL SPINE WITHOUT CONTRAST TECHNIQUE: Multidetector CT imaging of the head and cervical spine was performed following the standard protocol without intravenous contrast. Multiplanar CT image reconstructions of the cervical spine were also generated. COMPARISON:  03/12/2014 PET-CT. FINDINGS: CT HEAD FINDINGS Moderate left parietal scalp contusion. Acute right frontotemporal convexity subdural hematoma measuring up to 4 mm in thickness along the right anterior falx. Acute subarachnoid  hemorrhage throughout the right cerebral sulci, predominantly right inferior frontal and temporoparietal. Hemorrhagic cortical contusions/subarachnoid hemorrhage in the bilateral anterior frontal lobes. Probable small amount of acute subarachnoid hemorrhage in the interpeduncular cistern. No mass lesion. No midline shift. Basilar cisterns remain patent. Intracranial atherosclerosis. Nonspecific mild subcortical and periventricular white matter hypodensity, most in keeping with chronic small vessel ischemic change. Cerebral volume is age appropriate. No ventriculomegaly. The visualized paranasal sinuses are essentially clear. The mastoid air cells are unopacified. No evidence of calvarial fracture. CT CERVICAL SPINE FINDINGS No fracture is detected in the cervical spine. No prevertebral soft tissue swelling. There is mild straightening of the cervical spine, usually due to positioning and/or muscle spasm. Dens is well positioned between the lateral masses of C1. The lateral masses appear well-aligned. Moderate degenerative disc disease in the mid to lower cervical spine, most prominent at C5-6. Mild-to-moderate bilateral facet arthropathy. No significant cervical foraminal stenosis. No cervical spine subluxation. Visualized mastoid air cells appear clear. No evidence of intra-axial hemorrhage in the visualized brain. No gross cervical canal hematoma. Centrilobular emphysema at the lung apices. No significant pulmonary nodules at the visualized lung apices. No cervical adenopathy or other significant neck soft tissue abnormality. IMPRESSION: 1. Moderate left parietal scalp contusion. 2. Acute right frontotemporal convexity subdural hematoma. 3. Acute subarachnoid hemorrhage throughout the right cerebral sulci. 4. Acute hemorrhagic cortical contusions/subarachnoid hemorrhage in the bilateral anterior frontal lobes. 5. No midline shift. Basilar cisterns remain patent. No ventriculomegaly. 6. Mild chronic small vessel  ischemia. 7. No cervical spine fracture or subluxation. 8. Moderate cervical spine degenerative changes. Critical Value/emergent results were called by telephone at  the time of interpretation on 01/22/2016 at 7:52 pm to Dr. Quintella Reichert , who verbally acknowledged these results. Electronically Signed   By: Ilona Sorrel M.D.   On: 01/22/2016 19:53   Ct Abdomen Pelvis W Contrast  01/22/2016  CLINICAL DATA:  Fall with head hematoma. EXAM: CT CHEST, ABDOMEN, AND PELVIS WITH CONTRAST TECHNIQUE: Multidetector CT imaging of the chest, abdomen and pelvis was performed following the standard protocol during bolus administration of intravenous contrast. CONTRAST:  100 mL Isovue-300 COMPARISON:  03/20/2014 FINDINGS: CT CHEST FINDINGS Mediastinum/Lymph Nodes: The thoracic aorta is heavily calcified without aneurysm. Flow in the great vessels. No evidence for a mediastinal hematoma. Coronary artery calcifications. Small hiatal hernia. Lungs/Pleura: Trachea and mainstem bronchi are patent. Motion artifact. Lucency of the upper lungs and cannot exclude emphysema. Small air-filled collections along the medial left upper lobe adjacent to the descending thoracic aorta. These are new from the prior CT. Question a tiny nodular structure in the left upper lobe on sequence 5, image 30 which is probably stable. There is some atelectasis at both lung bases. Stable punctate nodule along the left major fissure on sequence 5, image 32. One or two small nodular densities in the superior segment of left lower lobe are new on sequence 5, image 31. Largest nodular density roughly measures 5 mm. Musculoskeletal: Fractures of left sixth, seventh, eighth, ninth and tenth ribs. No definite pneumothorax. Both shoulders are located. Visualized clavicles are intact. Prior median sternotomy. No large pneumothorax. There is a compression fracture involving the T8 vertebral body. There is an anterior wedge deformity of the T8 vertebral body with  approximately 60% loss of height along the anterior aspect. CT ABDOMEN PELVIS FINDINGS Hepatobiliary: Motion artifact on this exam limits evaluation. Small low-density structures in the liver could represent small cysts. There is high-density material in the gallbladder suggestive for stones. No significant biliary dilatation. Portal venous system appears to be patent. Pancreas: No gross abnormality to the pancreas. Spleen: No gross abnormality to the spleen. No evidence for perisplenic fluid. Adrenals/Urinary Tract: Mild fullness of left adrenal gland is probably stable. No gross abnormality to the right adrenal gland. Exophytic left renal cyst. Normal appearance of the urinary bladder. Mild fullness of the renal collecting systems bilaterally. Stomach/Bowel: Small hiatal hernia. There is no significant dilatation of the bowel structures and no evidence for a bowel obstruction. There is mesenteric edema in the left lower abdomen on sequence 4, image 87. This is a difficult area to evaluate due to motion artifact throughout the study. There may also be a small amount of mesenteric edema in the right lower abdomen. Vascular/Lymphatic: Vascular structures are heavily calcified. The infrarenal abdominal aorta measures up to 2.6 cm. No suspicious abdominal or pelvic lymphadenopathy. Reproductive: No gross abnormality to the prostate. Other: No evidence for free air. There is edema or a small amount of fluid in the left abdominal mesentery. Musculoskeletal: Again noted is heterogeneity of the in the left ilium related to Paget's disease and this is similar to the prior examination. There is a fracture of the left L4 transverse process. Fracture of the left eleventh rib. There is also a compression deformity involving the L1 vertebral body which could be old. Marked disc space loss at L5-S1. Bilateral pars defects at L5. Minimal anterolisthesis at L5-S1. IMPRESSION: Multiple left posterior rib fractures (6 through 11)  without a pneumothorax. New small air-filled collections along the medial left upper lobe could represent traumatic pneumatoceles. Few new parenchymal nodular densities in left  lower lobe are nonspecific but could be also related to recent trauma. Evaluation of the intra-abdominal structures is limited due to motion artifact but there is evidence for mesenteric edema and fluid, particularly in the left abdomen and left lower quadrant. Findings raise concern for a mesenteric injury. There is no evidence for free air. Fracture of the L4 left transverse process. Compression fractures involving T8 and L1. Suspect that the T8 fracture is acute but these are age-indeterminate. These results were called by telephone at the time of interpretation on 01/22/2016 at 8:01 pm to Dr. Quintella Reichert , who verbally acknowledged these results. Electronically Signed   By: Markus Daft M.D.   On: 01/22/2016 20:05   Dg Chest Port 1 View  01/23/2016  CLINICAL DATA:  Multiple rib fractures EXAM: PORTABLE CHEST 1 VIEW COMPARISON:  Portable chest x-ray and chest CT scan of Jan 22, 2016 FINDINGS: The lungs are adequately inflated. There is minimal bibasilar atelectasis which appears stable. A trace of pleural fluid on the left is suspected the interstitial markings on the left are slightly more conspicuous today. There is no pneumothorax or pneumomediastinum. The heart is top-normal in size. The pulmonary vascularity is normal. The mediastinum is normal in width. Known left posterior rib fractures are faintly visible. IMPRESSION: Persistent bibasilar atelectasis. Atelectasis. Mild interstitial prominence on the left has developed which may reflect mild edema of cardiac or noncardiac cause. There is no pneumothorax nor large pleural effusion. Electronically Signed   By: David  Martinique M.D.   On: 01/23/2016 07:49   Dg Chest Port 1 View  01/22/2016  CLINICAL DATA:  Fall from ladder with head injury. EXAM: PORTABLE CHEST 1 VIEW COMPARISON:   08/28/2009 chest radiograph FINDINGS: Sternotomy wires appear aligned and intact. Stable cardiomediastinal silhouette with normal heart size. No pneumothorax. No pleural effusion. Bibasilar atelectasis. No pulmonary edema. Minimally displaced posterior left seventh and eighth rib fractures, which appear acute. IMPRESSION: 1. Minimally displaced acute posterior left seventh and eighth rib fractures. No pneumothorax . 2. Bibasilar atelectasis. Electronically Signed   By: Ilona Sorrel M.D.   On: 01/22/2016 18:30    Assessment/Plan: Diagnosis: TBI/left rib fx's after fal 1. Does the need for close, 24 hr/day medical supervision in concert with the patient's rehab needs make it unreasonable for this patient to be served in a less intensive setting? Yes 2. Co-Morbidities requiring supervision/potential complications: htn, pain mgt 3. Due to bladder management, bowel management, safety, skin/wound care, disease management, medication administration, pain management and patient education, does the patient require 24 hr/day rehab nursing? Yes 4. Does the patient require coordinated care of a physician, rehab nurse, PT (1-2 hrs/day, 5 days/week), OT (1-2 hrs/day, 5 days/week) and SLP (1-2 hrs/day, 5 days/week) to address physical and functional deficits in the context of the above medical diagnosis(es)? Yes Addressing deficits in the following areas: balance, endurance, locomotion, strength, transferring, bowel/bladder control, bathing, dressing, feeding, grooming, toileting, cognition and psychosocial support 5. Can the patient actively participate in an intensive therapy program of at least 3 hrs of therapy per day at least 5 days per week? Yes 6. The potential for patient to make measurable gains while on inpatient rehab is excellent 7. Anticipated functional outcomes upon discharge from inpatient rehab are modified independent  with PT, modified independent with OT, modified independent with SLP. 8. Estimated  rehab length of stay to reach the above functional goals is:  7 days 9. Does the patient have adequate social supports and living environment to accommodate  these discharge functional goals? Yes 10. Anticipated D/C setting: Home 11. Anticipated post D/C treatments: HH therapy and Outpatient therapy 12. Overall Rehab/Functional Prognosis: excellent  RECOMMENDATIONS: This patient's condition is appropriate for continued rehabilitative care in the following setting: CIR Patient has agreed to participate in recommended program. Yes Note that insurance prior authorization may be required for reimbursement for recommended care.  Comment: Rehab Admissions Coordinator to follow up.  Thanks,  [name], MD, Mellody Drown     01/24/2016

## 2016-01-24 NOTE — Progress Notes (Signed)
Occupational Therapy Treatment Patient Details Name: Raymond Swanson MRN: [id number] DOB: [birth year] Today's Date: 01/24/2016    History of present illness 82 yo male with SDH and multiple L sided rib fractures from fall while cleaning gutters. CT on 6/1 showed Progression of right frontal and parietal subdural hematomas, Progression of left subdural hygroma, No shift of the midline structures, and Progression of right frontal hemorrhagic contusion.Pt with significant PMhx of CA (skin and prostate), anemia, CAD, HTN, and CABG.   OT comments  Pt agreeable to mobilize but states "it hurts so much". Required +2 mod A for sit - stand transfers but min A +2 for ambulation to sink. Unable to release RW in standing for functional tasks. HR increased to 124 with mobility. Continue to recommend CIR for rehab. Will continue to follow to address established goals and maximize functional level of independence.  Follow Up Recommendations  CIR;Supervision/Assistance - 24 hour    Equipment Recommendations  3 in 1 bedside comode    Recommendations for Other Services      Precautions / Restrictions Precautions Precautions: Fall Precaution Comments: watch O2 Sats       Mobility Bed Mobility               General bed mobility comments: Pt OOB in chair  Transfers Overall transfer level: Needs assistance Equipment used: Rolling walker (2 wheeled) Transfers: Sit to/from Stand Sit to Stand: Mod assist;+2 physical assistance Stand pivot transfers: Min assist;+2 physical assistance       General transfer comment: Poor use of RW. Would not let go of RW when descending to chair, pulling RW toward him.      Balance Overall balance assessment: Needs assistance   Sitting balance-Leahy Scale: Fair       Standing balance-Leahy Scale: Poor                     ADL       Grooming: Set up;Sitting;Oral care;Wash/dry face;Applying deodorant                                Functional mobility during ADLs: +2 for physical assistance;Moderate assistance. HOH impacts therapy at times. Pt requires increased assistance for mobility today as compared to eval yesterday.        Vision                     Perception     Praxis      Cognition   Behavior During Therapy: [facility] for tasks assessed/performed Overall Cognitive Status: No family/caregiver present to determine baseline cognitive functioning (will further assess)                       Extremity/Trunk Assessment   BUE generalized weakness. Kyphotic. Unable to achieve upright posture most likely due to pain            Exercises     Shoulder Instructions       General Comments      Pertinent Vitals/ Pain       Pain Assessment: Faces Faces Pain Scale: Hurts whole lot Pain Location: chest/ribs Pain Descriptors / Indicators: Grimacing;Guarding;Moaning Pain Intervention(s): Limited activity within patient's tolerance  Home Living  Prior Functioning/Environment              Frequency Min 2X/week     Progress Toward Goals  OT Goals(current goals can now be found in the care plan section)  Progress towards OT goals: Progressing toward goals  Acute Rehab OT Goals Patient Stated Goal: decrease pain OT Goal Formulation: With patient Time For Goal Achievement: 02/06/16 Potential to Achieve Goals: Good ADL Goals Pt Will Perform Grooming: with min guard assist;standing Pt Will Perform Upper Body Bathing: with set-up;sitting Pt Will Perform Lower Body Bathing: with min guard assist;sit to/from stand Pt Will Transfer to Toilet: with min assist;ambulating;bedside commode Pt Will Perform Toileting - Clothing Manipulation and hygiene: with min guard assist;sit to/from stand  Plan Discharge plan remains appropriate    Co-evaluation                 End of Session Equipment Utilized During Treatment: Gait  belt;Rolling walker;Oxygen   Activity Tolerance Patient limited by pain   Patient Left in chair;with call bell/phone within reach   Nurse Communication Mobility status        Time: [id number] OT Time Calculation (min): 28 min  Charges: OT General Charges $OT Visit: 1 Procedure OT Treatments $Self Care/Home Management : 23-37 mins  Raymond Swanson,Raymond Swanson 01/24/2016, 12:09 PM   [facility], OTR/L  [phone] 01/24/2016

## 2016-01-25 LAB — BASIC METABOLIC PANEL
ANION GAP: 5 (ref 5–15)
BUN: 14 mg/dL (ref 6–20)
CALCIUM: 8.9 mg/dL (ref 8.9–10.3)
CO2: 28 mmol/L (ref 22–32)
CREATININE: 0.89 mg/dL (ref 0.61–1.24)
Chloride: 103 mmol/L (ref 101–111)
Glucose, Bld: 145 mg/dL — ABNORMAL HIGH (ref 65–99)
Potassium: 4.4 mmol/L (ref 3.5–5.1)
SODIUM: 136 mmol/L (ref 135–145)

## 2016-01-25 MED ORDER — ACETAMINOPHEN 325 MG PO TABS
650.0000 mg | ORAL_TABLET | Freq: Four times a day (QID) | ORAL | Status: DC
  Administered 2016-01-25 – 2016-01-28 (×11): 650 mg via ORAL
  Filled 2016-01-25 (×12): qty 2

## 2016-01-25 MED ORDER — OXYCODONE HCL 5 MG PO TABS
2.5000 mg | ORAL_TABLET | ORAL | Status: DC | PRN
  Administered 2016-01-25 – 2016-01-26 (×3): 5 mg via ORAL
  Filled 2016-01-25 (×3): qty 1

## 2016-01-25 NOTE — Progress Notes (Signed)
Patient ID: Raymond Swanson, male   DOB: [birth year], 82 y.o.   MRN: [id number] Vital signs are stable Patient is alert oriented and moving all 4 extremities well Neurologic status is stable

## 2016-01-25 NOTE — Progress Notes (Signed)
Patient ambulated in the hall using a walker. Walked 25 feet. C/o pain. Steady gait but the walker kept having to be redirected. Patient sat in chair and wheeled back to room.

## 2016-01-25 NOTE — Progress Notes (Signed)
Subjective: Pain seems to be the main issue, just trying to sit him forward is difficult.  He is moving air well, no CXR recently.  He says he isn't eating much, but has had a BM.    Objective: Vital signs in last 24 hours: Temp:  [97.4 F (36.3 C)-99.6 F (37.6 C)] 99.1 F (37.3 C) (06/03 1124) Pulse Rate:  [27-123] 123 (06/03 1124) Resp:  [12-24] 20 (06/03 1124) BP: (104-131)/(51-87) 106/68 mmHg (06/03 1124) SpO2:  [93 %-100 %] 93 % (06/03 1124) Last BM Date: 01/22/16 PO not recorded 1200 IV Urine 675 Tm 99.6, tachycardic - EKG this AM is Sinus tachycardia. BMP OK Intake/Output from previous day: 06/02 0701 - 06/03 0700 In: 1200 [I.V.:1200] Out: 675 [Urine:675] Intake/Output this shift: Total I/O In: -  Out: 175 [Urine:175]  General appearance: alert, cooperative, no distress and significant pain with any movement left chest.   Resp: clear to auscultation bilaterally and anterior, I can't even get him to lean forward to listen to his back sitting in the chair.  Chest wall: no tenderness, left sided chest wall tenderness Cardio: Irregular I think it's sinus tachycardia GI: soft, non-tender; bowel sounds normal; no masses,  no organomegaly he is alert, speech is intact, he is mentating well, moves all extremities well.    Lab Results:   Recent Labs  01/22/16 1811 01/22/16 1824 01/23/16 0516  WBC 16.7*  --  11.8*  HGB 13.1 14.3 11.9*  HCT 41.3 42.0 37.3*  PLT 153  --  149*    BMET  Recent Labs  01/23/16 0516 01/25/16 0411  NA 139 136  K 4.4 4.4  CL 107 103  CO2 28 28  GLUCOSE 183* 145*  BUN 18 14  CREATININE 1.07 0.89  CALCIUM 9.0 8.9   PT/INR  Recent Labs  01/22/16 1811  LABPROT 14.1  INR 1.07     Recent Labs Lab 01/22/16 1811  AST 31  ALT 24  ALKPHOS 70  BILITOT 0.6  PROT 5.9*  ALBUMIN 3.5     Lipase  No results found for: LIPASE   Studies/Results: No results found. Prior to Admission medications   Medication Sig Start  Date End Date Taking? Authorizing Provider  aspirin 81 MG tablet Take 81 mg by mouth daily.     Yes Historical Provider, MD  calcium gluconate 500 MG tablet Take 500 mg by mouth daily.     Yes Historical Provider, MD  cholecalciferol (VITAMIN D) 1000 UNITS tablet Take 1,000 Units by mouth daily.     Yes Historical Provider, MD  diphenhydramine-acetaminophen (TYLENOL PM) 25-500 MG TABS Take 1 tablet by mouth at bedtime as needed (sleep).    Yes Historical Provider, MD  fluorouracil (EFUDEX) 5 % cream Apply topically 2 (two) times daily. 11/15/12  Yes Marletta Lor, MD  levothyroxine (SYNTHROID, LEVOTHROID) 50 MCG tablet Take 1 tablet (50 mcg total) by mouth daily. 12/31/15  Yes Marletta Lor, MD  Melatonin 5 MG TABS Take 2.5 mg by mouth at bedtime.   Yes Historical Provider, MD  Multiple Vitamins-Minerals (PRESERVISION/LUTEIN PO) Take by mouth. Daily      Yes Historical Provider, MD  nitroGLYCERIN (NITROSTAT) 0.4 MG SL tablet Place 1 tablet (0.4 mg total) under the tongue every 5 (five) minutes as needed. 12/31/15  Yes Marletta Lor, MD  Omega-3 Fatty Acids (OMEGA-3 FISH OIL PO) Take by mouth daily.   Yes Historical Provider, MD  simvastatin (ZOCOR) 20 MG tablet Take 1 tablet (20  mg total) by mouth daily. 12/31/15  Yes Marletta Lor, MD    Medications: . docusate sodium  100 mg Oral BID  . levothyroxine  50 mcg Oral QAC breakfast   . dextrose 5 % and 0.45 % NaCl with KCl 20 mEq/L 50 mL/hr at 01/25/16 0700   Assessment/Plan 3 foot fall to dirt down for 3-4 hours before found; amnestic of the event Closed head injury with traumatic subarachnoid hemorrhage contusions CT 01/23/16 shows: some evolution of is subarachnoid and small subdural but still no mass effect no surgical intervention  Multiple rib fracture (left 6-10) without pneumothorax - ongoing pain control issue L4 transverse fracture/T8 and L1 compression fractures Hx of CABG Hx of gastric ulcer Hypertension Hx of  anemia Hx of hypothryoid FEN: Regular diet  ID: none  VTE:  SCD   Plan:  Push IS, supplements of choice, I have increased his Oxycodone some and added tylenol q6h.       LOS: 3 days    Ron Beske 01/25/2016 [phone]

## 2016-01-26 DIAGNOSIS — L8990 Pressure ulcer of unspecified site, unspecified stage: Secondary | ICD-10-CM | POA: Insufficient documentation

## 2016-01-26 MED ORDER — ENSURE ENLIVE PO LIQD
237.0000 mL | Freq: Two times a day (BID) | ORAL | Status: DC
  Administered 2016-01-28: 237 mL via ORAL
  Filled 2016-01-26 (×5): qty 237

## 2016-01-26 MED ORDER — PSYLLIUM 95 % PO PACK
1.0000 | PACK | Freq: Every day | ORAL | Status: DC
  Administered 2016-01-26 – 2016-01-28 (×3): 1 via ORAL
  Filled 2016-01-26 (×3): qty 1

## 2016-01-26 MED ORDER — BOOST / RESOURCE BREEZE PO LIQD
1.0000 | Freq: Three times a day (TID) | ORAL | Status: DC
  Administered 2016-01-27 – 2016-01-28 (×3): 1 via ORAL
  Filled 2016-01-26 (×7): qty 1

## 2016-01-26 MED ORDER — TRAMADOL HCL 50 MG PO TABS
50.0000 mg | ORAL_TABLET | Freq: Four times a day (QID) | ORAL | Status: DC
  Administered 2016-01-26 – 2016-01-28 (×10): 50 mg via ORAL
  Filled 2016-01-26 (×10): qty 1

## 2016-01-26 MED ORDER — FAMOTIDINE 20 MG PO TABS
20.0000 mg | ORAL_TABLET | Freq: Two times a day (BID) | ORAL | Status: DC
  Administered 2016-01-26 – 2016-01-28 (×5): 20 mg via ORAL
  Filled 2016-01-26 (×5): qty 1

## 2016-01-26 NOTE — Progress Notes (Signed)
  Subjective: Pain is still the primary issue, he actually looks pretty good, He is sitting up in the bed and sats are 100% on room air.  He did walk some yesterday, but says he can't sleep and is depressed over ongoing pain.  Not really eating well, nutrition consult is in, and I have ordered some supplements.    Objective: Vital signs in last 24 hours: Temp:  [97.6 F (36.4 C)-99.1 F (37.3 C)] 98.4 F (36.9 C) (06/04 0449) Pulse Rate:  [89-123] 105 (06/04 0449) Resp:  [14-23] 18 (06/04 0449) BP: (96-118)/(48-77) 118/77 mmHg (06/04 0449) SpO2:  [93 %-99 %] 99 % (06/04 0449) Last BM Date: 01/22/16 450 PO Urine 620 recorded TM 99.6, remain tachycardic most of the time BMP OK yesterday No films Intake/Output from previous day: 06/03 0701 - 06/04 0700 In: 1586.7 [P.O.:450; I.V.:1136.7] Out: 620 [Urine:620] Intake/Output this shift:    General appearance: alert, cooperative and no distress Resp: clear to auscultation bilaterally and very tender on left side GI: soft, non-tender; bowel sounds normal; no masses,  no organomegaly Neuro:  alert, normal mentation, move all extremites well.  Lab Results:  No results for input(s): WBC, HGB, HCT, PLT in the last 72 hours.  BMET  Recent Labs  01/25/16 0411  NA 136  K 4.4  CL 103  CO2 28  GLUCOSE 145*  BUN 14  CREATININE 0.89  CALCIUM 8.9   PT/INR No results for input(s): LABPROT, INR in the last 72 hours.   Recent Labs Lab 01/22/16 1811  AST 31  ALT 24  ALKPHOS 70  BILITOT 0.6  PROT 5.9*  ALBUMIN 3.5     Lipase  No results found for: LIPASE   Studies/Results: No results found.  Medications: . acetaminophen  650 mg Oral Q6H  . docusate sodium  100 mg Oral BID  . levothyroxine  50 mcg Oral QAC breakfast    Assessment/Plan 3 foot fall to dirt down for 3-4 hours before found; amnestic of the event Closed head injury with traumatic subarachnoid hemorrhage contusions CT 01/23/16 shows: some evolution of is  subarachnoid and small subdural but still no mass effect no surgical intervention  Multiple rib fracture (left 6-10) without pneumothorax - ongoing pain control issue L4 transverse fracture/T8 and L1 compression fractures Hx of CABG Hx of gastric ulcer Hypertension Hx of anemia Hx of hypothryoid FEN: Regular diet  ID: none  VTE: SCD    Plan:  He has not had any pain meds  In over 12 hours aside from the Tylenol i ordered around the clock.  I will add Tramadol q6h also and see if we can get better control over his pain and get him moving more.  Add Fiber for constipation.  LOS: 4 days    Orest Dygert 01/26/[phone]

## 2016-01-26 NOTE — Progress Notes (Signed)
Patient ID: Raymond Swanson, male   DOB: 1925-07-11, 82 y.o.   MRN: [id number] Neurologically doing quite well however patient states that he is depressed He notes that the boredom of little activity and interaction along with the pain Is getting to him From a neurosurgical perspective he seems to be doing well Offered words of encouragement

## 2016-01-27 DIAGNOSIS — S32019A Unspecified fracture of first lumbar vertebra, initial encounter for closed fracture: Secondary | ICD-10-CM | POA: Diagnosis present

## 2016-01-27 DIAGNOSIS — S22069A Unspecified fracture of T7-T8 vertebra, initial encounter for closed fracture: Secondary | ICD-10-CM | POA: Diagnosis present

## 2016-01-27 DIAGNOSIS — S2242XA Multiple fractures of ribs, left side, initial encounter for closed fracture: Secondary | ICD-10-CM | POA: Diagnosis present

## 2016-01-27 DIAGNOSIS — W19XXXA Unspecified fall, initial encounter: Secondary | ICD-10-CM | POA: Diagnosis present

## 2016-01-27 DIAGNOSIS — S32009A Unspecified fracture of unspecified lumbar vertebra, initial encounter for closed fracture: Secondary | ICD-10-CM | POA: Diagnosis present

## 2016-01-27 MED ORDER — HYDROMORPHONE HCL 1 MG/ML IJ SOLN: 0.5000 mg | INTRAMUSCULAR | Status: DC | PRN

## 2016-01-27 MED ORDER — POLYETHYLENE GLYCOL 3350 17 G PO PACK
17.0000 g | PACK | Freq: Every day | ORAL | Status: DC
  Administered 2016-01-27 – 2016-01-28 (×2): 17 g via ORAL
  Filled 2016-01-27 (×2): qty 1

## 2016-01-27 NOTE — Progress Notes (Signed)
I met briefly with pt and his son at bedside after completing therapy. We discussed a possible inpt rehab admission today pending insurance approval which I initiated Friday. RN states pt to be transferred to [facility] momentarily. I have asked pt's son to call me when his Mom arrives so that I can discuss a possible inpt rehab admission. [phone]

## 2016-01-27 NOTE — Progress Notes (Signed)
Physical Therapy Treatment Patient Details Name: Raymond Swanson MRN: [id number] DOB: September 13, 1924 Today's Date: 01/27/2016    History of Present Illness 82 yo male with SDH and multiple L sided rib fractures from fall while cleaning gutters. CT on 6/1 showed Progression of right frontal and parietal subdural hematomas, Progression of left subdural hygroma, No shift of the midline structures, and Progression of right frontal hemorrhagic contusion.Pt with significant PMhx of CA (skin and prostate), anemia, CAD, HTN, and CABG.    PT Comments    Patient seen for increased activity and mobility progression. Worked in conjunction with OT therapist to mobilize patient. Patient required increased assist for functional tasks and max multimodal cues for gait training and activity tolerance. OF NOTE: Patient with desaturations on room air to mid 80s but rebounded well with rest and cues for breath control. HR elevated to upper 120s. BP stable. Will continue to see and progress as tolerated.   Follow Up Recommendations  CIR     Equipment Recommendations  Rolling walker with 5" wheels    Recommendations for Other Services Rehab consult     Precautions / Restrictions Precautions Precautions: Fall Precaution Comments: watch O2 Sats and HR Restrictions Weight Bearing Restrictions: No    Mobility  Bed Mobility Overal bed mobility: Needs Assistance Bed Mobility: Supine to Sit     Supine to sit: Min assist;HOB elevated        Transfers Overall transfer level: Needs assistance Equipment used: Rolling walker (2 wheeled) Transfers: Sit to/from Stand Sit to Stand: +2 physical assistance;Min assist         General transfer comment: Pt improving with mobility and tolerance for mobility. Able to ambulate today with assistance to guide RW  Ambulation/Gait Ambulation/Gait assistance: Min assist;+2 safety/equipment Ambulation Distance (Feet): 50 Feet Assistive device: Rolling walker (2  wheeled) Gait Pattern/deviations: Step-through pattern;Decreased stride length;Shuffle;Drifts right/left;Trunk flexed;Narrow base of support Gait velocity: decreased significantly Gait velocity interpretation: <1.8 ft/sec, indicative of risk for recurrent falls General Gait Details: patient with short shuffling steps, limited ability to clear foot from surface or to engge in functional gait, MAX cues for increased stride and cadence. Manual facilitation of posture and pacing as well as assist for control of RW.    Stairs            Wheelchair Mobility    Modified Rankin (Stroke Patients Only)       Balance   Sitting-balance support: Feet supported Sitting balance-Leahy Scale: Fair     Standing balance support: Bilateral upper extremity supported Standing balance-Leahy Scale: Poor                      Cognition Arousal/Alertness: Awake/alert Behavior During Therapy: WFL for tasks assessed/performed Overall Cognitive Status: Difficult to assess                      Exercises      General Comments        Pertinent Vitals/Pain Pain Assessment: Faces Faces Pain Scale: Hurts whole lot Pain Location: chest Pain Descriptors / Indicators: Grimacing;Moaning Pain Intervention(s): Limited activity within patient's tolerance    Home Living   Living Arrangements: Spouse/significant other             Additional Comments: one chil, son, lives in [place]    Prior Function            PT Goals (current goals can now be found in the care plan section) Acute Rehab  PT Goals Patient Stated Goal: decrease pain PT Goal Formulation: With patient Time For Goal Achievement: 02/06/16 Potential to Achieve Goals: Good Progress towards PT goals: Progressing toward goals    Frequency  Min 3X/week    PT Plan Current plan remains appropriate    Co-evaluation PT/OT/SLP Co-Evaluation/Treatment: Yes Reason for Co-Treatment: Complexity of the patient's  impairments (multi-system involvement);Necessary to address cognition/behavior during functional activity PT goals addressed during session: Mobility/safety with mobility;Balance       End of Session Equipment Utilized During Treatment: Gait belt Activity Tolerance: Patient limited by fatigue;Patient limited by pain Patient left: in chair;with call bell/phone within reach;with chair alarm set     Time: [id number] PT Time Calculation (min) (ACUTE ONLY): 23 min  Charges:  $Gait Training: 8-22 mins                    G CodesDuncan Dull Jan 31, 2016, 2:06 PM Alben Deeds, [place] DPT  [phone]

## 2016-01-27 NOTE — Progress Notes (Signed)
Occupational Therapy Treatment Patient Details Name: Raymond Swanson MRN: [id number] DOB: 1924/09/12 Today's Date: 01/27/2016    History of present illness 82 yo male with SDH and multiple L sided rib fractures from fall while cleaning gutters. CT on 6/1 showed Progression of right frontal and parietal subdural hematomas, Progression of left subdural hygroma, No shift of the midline structures, and Progression of right frontal hemorrhagic contusion.Pt with significant PMhx of CA (skin and prostate), anemia, CAD, HTN, and CABG.   OT comments  Pt making steady progress. Able to ambulate today with +2 min A. SOB; however vitals appear stable with the exception of HR elevating to 128 for short periods. Desat briefly on RA @85  (although poor waveform) during mobility. Pt painful with mobility but is willing to try because he "knows he needs to do it". Limited performance with LB ADL due to pain. May benefit form AE for LB ADL is pain continues to be a limiting factor. Son present for session. Continue to recommend rehab at [facility].   Follow Up Recommendations  CIR;Supervision/Assistance - 24 hour    Equipment Recommendations  3 in 1 bedside comode    Recommendations for Other Services      Precautions / Restrictions Precautions Precautions: Fall Precaution Comments: watch O2 Sats and HR       Mobility Bed Mobility Overal bed mobility: Needs Assistance Bed Mobility: Supine to Sit     Supine to sit: Min assist;HOB elevated        Transfers Overall transfer level: Needs assistance Equipment used: Rolling walker (2 wheeled) Transfers: Sit to/from Stand Sit to Stand: +2 physical assistance;Min assist         General transfer comment: Pt improving with mobility and tolerance for mobility. Able to ambulate today with assistance to guide RW. @ 30-40 ft RA. Increased WOB during mobility. PT guiding RW to increase stride. Physical assist to "drive" RW.     Balance     Sitting  balance-Leahy Scale: Fair       Standing balance-Leahy Scale: Poor                     ADL Overall ADL's : Needs assistance/impaired     Grooming: Set up;Sitting   Upper Body Bathing: Minimal assitance;Sitting   Lower Body Bathing: Moderate assistance;Sit to/from stand   Upper Body Dressing : Minimal assistance;Sitting           Toileting- Clothing Manipulation and Hygiene: Minimal assistance (A to hold clothing while using urinal)       Functional mobility during ADLs: +2 for physical assistance;Minimal assistance;Rolling walker        Vision                     Perception     Praxis      Cognition   Behavior During Therapy: WFL for tasks assessed/performed Overall Cognitive Status: Difficult to assess                       Extremity/Trunk Assessment   BUE generalzied weakness            Exercises     Shoulder Instructions       General Comments      Pertinent Vitals/ Pain       Pain Assessment: Faces Faces Pain Scale: Hurts whole lot Pain Location: chest Pain Descriptors / Indicators: Grimacing;Moaning Pain Intervention(s): Limited activity within patient's tolerance  Home Living  Prior Functioning/Environment              Frequency Min 2X/week     Progress Toward Goals  OT Goals(current goals can now be found in the care plan section)  Progress towards OT goals: Progressing toward goals  Acute Rehab OT Goals Patient Stated Goal: decrease pain OT Goal Formulation: With patient Time For Goal Achievement: 02/06/16 Potential to Achieve Goals: Good ADL Goals Pt Will Perform Grooming: with min guard assist;standing Pt Will Perform Upper Body Bathing: with set-up;sitting Pt Will Perform Lower Body Bathing: with min guard assist;sit to/from stand Pt Will Transfer to Toilet: with min assist;ambulating;bedside commode Pt Will Perform Toileting -  Clothing Manipulation and hygiene: with min guard assist;sit to/from stand  Plan Discharge plan remains appropriate    Co-evaluation                 End of Session Equipment Utilized During Treatment: Gait belt;Rolling walker   Activity Tolerance Patient tolerated treatment well   Patient Left in chair;with call bell/phone within reach;with family/visitor present   Nurse Communication Mobility status        Time: [id number] OT Time Calculation (min): 23 min  Charges: OT General Charges $OT Visit: 1 Procedure OT Treatments $Therapeutic Activity: 8-22 mins  Dianna Ewald,HILLARY 01/27/2016, 1:27 PM   [facility], OTR/L  [phone] 01/27/2016

## 2016-01-27 NOTE — Care Management Important Message (Signed)
Important Message  Patient Details  Name: Raymond Swanson MRN: [id number] Date of Birth: 03/05/1925   Medicare Important Message Given:  Yes    [name] [name] 01/27/2016, 12:00 PM

## 2016-01-27 NOTE — Progress Notes (Signed)
Patient ID: ANTHANY POLIT, male   DOB: 24-Jun-1925, 82 y.o.   MRN: [id number]   LOS: 5 days   Subjective: Very HoH! Feeling better, pain better controlled.   Objective: Vital signs in last 24 hours: Temp:  [97.4 F (36.3 C)-98 F (36.7 C)] 98 F (36.7 C) (06/05 0346) Pulse Rate:  [79-83] 79 (06/05 0008) Resp:  [14-17] 17 (06/05 0008) BP: (95-109)/(61-70) 109/61 mmHg (06/05 0346) SpO2:  [92 %-94 %] 93 % (06/05 0008) Last BM Date: 01/23/16   IS: 1084ml   Physical Exam General appearance: alert and no distress Resp: clear to auscultation bilaterally Cardio: regular rate and rhythm GI: normal findings: bowel sounds normal and soft, non-tender   Assessment/Plan: Fall TBI -- per Dr. Ellene Route Multiple left rib fxs -- Pulmonary toilet T8/L1 compression fxs L4 TVP fx Multiple medical problems -- Home meds FEN -- No issues VTE -- SCD's Dispo -- To floor, CIR when bed available    Lisette Abu, PA-C Pager: [phone] General Trauma PA Pager: [phone]  01/27/2016

## 2016-01-27 NOTE — Progress Notes (Signed)
Pt arrived to 5C06 via wheelchair.  Alert and oriented, in apparent pain.  Pt hard of hearing, hearing aid to R ear.   Will continue to monitor. Cori Razor, RN

## 2016-01-27 NOTE — PMR Pre-admission (Signed)
PMR Admission Coordinator Pre-Admission Assessment  Patient: Raymond Swanson is an 82 y.o., male MRN: [id number] DOB: April 22, 1925 Height: 5\' 8"  (172.7 cm) Weight: 62.4 kg (137 lb 9.1 oz)              Insurance Information HMO: yes    PPO:      PCP:      IPA:      80/20:      OTHER: medicare advantage enhanced policy PRIMARY: Blue Medicare      Policy#: [id number]      Subscriber: pt CM Name: Pieter Partridge      Phone#: [phone]     Fax#: [phone] Pre-Cert#: [id number] approved for 7 days     Employer: retired/[employer] Benefits:  Phone #: [phone]     Name: 01/27/16 Eff. Date: 08/25/15     Deduct: none      Out of Pocket Max: $3500      Life Max: none CIR: $250 copay per admission then covers 100%      SNF: no copay days 1-20: $40 copay per day days 21-100 Outpatient: 90%     Co-Pay: 10% no visit limit Home Health: 100%      Co-Pay: no visit limit DME: 80%     Co-Pay: 20% Providers: in network  SECONDARY: none        Medicaid Application Date:       Case Manager:  Disability Application Date:       Case Worker:   Emergency Facilities manager Information    Name Relation Home Work Mobile   [place] Spouse [phone]       Current Medical History  Patient Admitting Diagnosis: Traumatic SDH after fall  History of Present Illness: Raymond Swanson is a 82 y.o. right handed male with history of hypertension, prostate cancer, CAD with CABG.  Presented 01/22/2016 after a fall while cleaning some gutters. Questionable loss of consciousness. Patient completely amnesic for the event. By report patient was down for 3-4 hours before being found. CT of the head images revealed moderate left parietal scalp contusion, acute right frontotemporal subdural hematoma measuring up to 4 mm, acute subarachnoid hemorrhage throughout the right cerebral sulci. Hemorrhagic cortical contusions and subarachnoid hemorrhage bilateral anterior frontal lobes. No midline shift. Neurosurgery Dr.  Saintclair Halsted advise conservative care. CT of the chest showed multiple left posterior rib fractures without pneumothorax as well as fracture of the L4 left transverse process. Compression fractures T8 and L1 suspect T8 fracture or acute but age-indeterminate.. CT cervical spine negative. Follow-up CT of the head showed interval progression of subarachnoid hemorrhage on the right and continued to monitor. Maintain on a regular diet. Hospital course pain management.  Past Medical History  Past Medical History  Diagnosis Date  . Cancer ([place])   . ACUT GASTR ULCER W/HEMORR W/O MENTION OBST 09/10/2009  . ANEMIA 10/08/2009  . CORONARY ARTERY DISEASE 01/26/2007  . HYPERLIPIDEMIA 01/26/2007  . HYPERTENSION 01/26/2007  . HYPOTHYROIDISM 10/18/2007  . MELENA 08/28/2009  . PROSTATE CANCER, HX OF 01/26/2007  . SKIN CANCER, HX OF 01/26/2007    Family History  family history includes GI problems in his sister; Lung cancer (age of onset: 44) in his brother.  Prior Rehab/Hospitalizations:  Has the patient had major surgery during 100 days prior to admission? No  Current Medications   Current facility-administered medications:  .  acetaminophen (TYLENOL) tablet 650 mg, 650 mg, Oral, Q6H, Earnstine Regal, PA-C, 650 mg at 01/28/16 1216 .  docusate  sodium (COLACE) capsule 100 mg, 100 mg, Oral, BID, Arta Bruce Kinsinger, MD, 100 mg at 01/28/16 1044 .  famotidine (PEPCID) tablet 20 mg, 20 mg, Oral, BID, Earnstine Regal, PA-C, 20 mg at 01/28/16 1044 .  feeding supplement (BOOST / RESOURCE BREEZE) liquid 1 Container, 1 Container, Oral, TID BM, Earnstine Regal, PA-C, 1 Container at 01/28/16 1044 .  feeding supplement (ENSURE ENLIVE) (ENSURE ENLIVE) liquid 237 mL, 237 mL, Oral, BID BM, Earnstine Regal, PA-C, 237 mL at 01/28/16 1045 .  HYDROmorphone (DILAUDID) injection 0.5 mg, 0.5 mg, Intravenous, Q4H PRN, Lisette Abu, PA-C .  levothyroxine (SYNTHROID, LEVOTHROID) tablet 50 mcg, 50 mcg, Oral, QAC breakfast, Mickeal Skinner, MD, 50 mcg at 01/28/16 1044 .  nitroGLYCERIN (NITROSTAT) SL tablet 0.4 mg, 0.4 mg, Sublingual, Q5 min PRN, Arta Bruce Kinsinger, MD .  ondansetron [facility]) tablet 4 mg, 4 mg, Oral, Q6H PRN **OR** ondansetron (ZOFRAN) injection 4 mg, 4 mg, Intravenous, Q6H PRN, Arta Bruce Kinsinger, MD .  oxyCODONE (Oxy IR/ROXICODONE) immediate release tablet 2.5-7.5 mg, 2.5-7.5 mg, Oral, Q4H PRN, Earnstine Regal, PA-C, 5 mg at 01/26/16 0848 .  polyethylene glycol (MIRALAX / GLYCOLAX) packet 17 g, 17 g, Oral, Daily, Lisette Abu, PA-C, 17 g at 01/28/16 1044 .  psyllium (HYDROCIL/METAMUCIL) packet 1 packet, 1 packet, Oral, Daily, Earnstine Regal, PA-C, 1 packet at 01/28/16 1044 .  traMADol (ULTRAM) tablet 50 mg, 50 mg, Oral, Q6H, Earnstine Regal, PA-C, 50 mg at 01/28/16 1044  Patients Current Diet: Diet regular Room service appropriate?: Yes; Fluid consistency:: Thin  Precautions / Restrictions Precautions Precautions: Fall Precaution Comments: watch O2 Sats and HR Cervical Brace: Soft collar Restrictions Weight Bearing Restrictions: No   Has the patient had 2 or more falls or a fall with injury in the past year?No  Prior Activity Level Community (5-7x/wk): pt active pta. bowls weekly, walks mile per day, etc. retired Engineer, production. Works out on Sales promotion account executive at home daily  Development worker, international aid / [place] Devices/Equipment: [place]: Kasandra Knudsen - single point  Prior Device Use: Indicate devices/aids used by the patient prior to current illness, exacerbation or injury? cane   Prior Functional Level Prior Function Level of Independence: Independent Comments: Walked with his dog every day, likes to garden, wife likes to bowl  Self Care: Did the patient need help bathing, dressing, using the toilet or eating?  Independent  Indoor Mobility: Did the patient need assistance with walking from room to room (with or without device)? Independent  Stairs: Did  the patient need assistance with internal or external stairs (with or without device)? Independent  Functional Cognition: Did the patient need help planning regular tasks such as shopping or remembering to take medications? Independent  Current Functional Level Cognition  Overall Cognitive Status: Difficult to assess Difficult to assess due to: Hard of hearing/deaf Orientation Level: Oriented X4    Extremity Assessment (includes Sensation/Coordination)  Upper Extremity Assessment: Defer to OT evaluation  Lower Extremity Assessment: Generalized weakness    ADLs  Overall ADL's : Needs assistance/impaired Eating/Feeding: Set up, Sitting Grooming: Set up, Sitting Upper Body Bathing: Minimal assitance, Sitting Lower Body Bathing: Moderate assistance, Sit to/from stand Upper Body Dressing : Minimal assistance, Sitting Lower Body Dressing: Moderate assistance, Sit to/from stand Lower Body Dressing Details (indicate cue type and reason): Pt with difficulty managing LB ADLs secondary to pain in L ribs. Toileting- Clothing Manipulation and Hygiene: Minimal assistance (A to hold clothing while using urinal) Functional mobility during ADLs: +2 for  physical assistance, Minimal assistance, Rolling walker General ADL Comments: Pt very HOH making communication difficult. Pt reports significant pain in L ribs with sit to stand from chair, limiting participation in standing ADL activities. SpO2 jumping from low 60s up to mid 90s with poor waveform; instructed in deep breathing and pursed lip breathing but pt reports pain with both.    Mobility  Overal bed mobility: Needs Assistance Bed Mobility: Supine to Sit Supine to sit: Min assist, HOB elevated General bed mobility comments: Pt has been in chair since we got up yesterday.  RN informed of skin tear on low back.  Pt sitting on wet pad as he likely spills urinal (he has been getting some urine in the urinal). Blue pressure relief pad placed in chair  when pt returned.     Transfers  Overall transfer level: Needs assistance Equipment used: Rolling walker (2 wheeled) Transfers: Sit to/from Stand Sit to Stand: +2 physical assistance, Min assist Stand pivot transfers: Min assist, +2 physical assistance General transfer comment: Pt improving with mobility and tolerance for mobility. Able to ambulate today with assistance to guide RW    Ambulation / Gait / Stairs / Wheelchair Mobility  Ambulation/Gait Ambulation/Gait assistance: Min assist, +2 safety/equipment Ambulation Distance (Feet): 50 Feet Assistive device: Rolling walker (2 wheeled) Gait Pattern/deviations: Step-through pattern, Decreased stride length, Shuffle, Drifts right/left, Trunk flexed, Narrow base of support General Gait Details: patient with short shuffling steps, limited ability to clear foot from surface or to engge in functional gait, MAX cues for increased stride and cadence. Manual facilitation of posture and pacing as well as assist for control of RW.  Gait velocity: decreased significantly Gait velocity interpretation: <1.8 ft/sec, indicative of risk for recurrent falls    Posture / Balance Balance Overall balance assessment: Needs assistance Sitting-balance support: Feet supported Sitting balance-Leahy Scale: Fair Standing balance support: Bilateral upper extremity supported Standing balance-Leahy Scale: Poor    Special needs/care consideration Skin multiple echymotic areas to back, head, arms, hips and elbow Skin tear elbow, hip and back. Sacral foam Stage 1 Bowel mgmt: continent LBM 6/1 Bladder mgmt: incontinent at times Very HOH : Has hearing aid r ear not working well. Pt's son to take it to be worked on 6/6    Previous [place]: Spouse/significant other  Lives With: Spouse Available Help at Discharge: Family, Available 24 hours/day Type of Home: [place]: One level Home Access: Stairs to enter Entrance  Stairs-Rails: Right Entrance Stairs-Number of Steps: 2 Bathroom Shower/Tub: Multimedia programmer: Standard Bathroom Accessibility: Yes How Accessible: Accessible via walker [place]: No Additional Comments: one chil, son, lives in [place] Son currently here from [place]  Discharge [place] for Discharge Living Setting: Patient's home, Lives with (comment) (wife who is 104 yo and also very active) Type of Home at Discharge: House Discharge Home Layout: One level Discharge Home Access: Stairs to enter Entrance Stairs-Rails: Right Entrance Stairs-Number of Steps: 2 Discharge Bathroom Shower/Tub: Walk-in shower Discharge Bathroom Toilet: Standard Discharge Bathroom Accessibility: Yes How Accessible: Accessible via walker Does the patient have any problems obtaining your medications?: No  Social/Family/Support Systems Patient Roles: Spouse, Parent Contact Information: wife, Sunday Spillers, states call her Ethell Anticipated Caregiver: wife Anticipated Caregiver's Contact Information: see above Ability/Limitations of Caregiver: no limitations Caregiver Availability: 24/7 Discharge Plan Discussed with Primary Caregiver: Yes Is Caregiver In Agreement with Plan?: Yes Does Caregiver/Family have Issues with Lodging/Transportation while Pt is in Rehab?: No  Goals/Additional Needs Patient/Family  Goal for Rehab: Mod I with PT, OT, and SLP Expected length of stay: ELOS 7 days Equipment Needs: HOH with hearing aide r ear not functioning well, son to take to hearing aide stor 6/6/ to replace or fix Pt/Family Agrees to Admission and willing to participate: Yes Program Orientation Provided & Reviewed with Pt/Caregiver Including Roles  & Responsibilities: Yes  Decrease burden of Care through IP rehab admission: n/a  Possible need for SNF placement upon discharge:not anticipated  Patient Condition: This patient's medical and functional status has changed since the consult dated:  01/24/2016 in which the Rehabilitation Physician determined and documented that the patient's condition is appropriate for intensive rehabilitative care in an inpatient rehabilitation facility. See "History of Present Illness" (above) for medical update. Functional changes are: moderate assist. Patient's medical and functional status update has been discussed with the Rehabilitation physician and patient remains appropriate for inpatient rehabilitation. Will admit to inpatient rehab today.  Preadmission Screen Completed By:  Cleatrice Burke, 01/28/2016 3:35 PM ______________________________________________________________________   Discussed status with Dr. Naaman Plummer on 01/28/16 at 1535 and received telephone approval for admission today.  Admission Coordinator:  Cleatrice Burke, time 1535 Date 01/28/2016

## 2016-01-27 NOTE — Progress Notes (Signed)
I met with pt's wife at bedside to discuss inpt rehab admission. She prefers inpt rehab rather than SNF. I will follow up after insurance determination. [phone]

## 2016-01-27 NOTE — Progress Notes (Signed)
Nutrition Follow-up  DOCUMENTATION CODES:   Severe malnutrition in context of social or environmental circumstances  INTERVENTION:  Boost Breeze TID. Each supplement provides 250 kcals and 9 grams of protein. Ensure Enlive BID. Each supplement provides 350 kcals and 20 grams of protein.  Continue Magic Cup TID. Each supplement provides 250 kcals and 9 grams of protein. Encouraged adequate nutrition to gain his lost weight and maintain his lean body mass.   NUTRITION DIAGNOSIS:   Malnutrition (Severe) related to social / environmental circumstances as evidenced by severe depletion of muscle mass, severe depletion of body fat.  Ongoing   GOAL:   Patient will meet greater than or equal to 90% of their needs  Unmet   MONITOR:   PO intake, Supplement acceptance, Weight trends  REASON FOR ASSESSMENT:   Consult Assessment of nutrition requirement/status  ASSESSMENT:   Pt admitted with a head injury after falling 3-4 feet from a step ladder.  Pt eating 0-30% of meals.  Physician ordered supplements yesterday.  Pt breakfast tray at bedside. Pt drank 50% orange juice but food was left untouched.   Pt HOH. Pt emotional and in distress at time of visit. States it hurts to cough.  Pt said he is not eating d/t poor appetite. Willing to drink water, which was at bedside.  Per chart, pt/family has refused supplements and supplements have not been given to pt. Pt said today that he is amenable to drinking the supplements. Will continue to provide all supplements and determine which supplements to discontinue/continue based on pt preference at follow up.   No weight information since admission-> 137 lbs. Pt had lost 3 lbs in 3 weeks at admission.  Labs reviewed; glucose 145.  Meds reviewed; colace, miralax.  Diet Order:  Diet regular Room service appropriate?: Yes; Fluid consistency:: Thin  Skin:  Wound (see comment) (PU stage 1 on coccyx )  Last BM:  6/1  Height:   Ht  Readings from Last 1 Encounters:  01/22/16 5\' 8"  (1.727 m)    Weight:   Wt Readings from Last 1 Encounters:  01/22/16 137 lb 9.1 oz (62.4 kg)    Ideal Body Weight:  70 kg  BMI:  Body mass index is 20.92 kg/(m^2).  Estimated Nutritional Needs:   Kcal:  1500-1700  Protein:  75-85 grams  Fluid:  > 1.5 L/day  EDUCATION NEEDS:   Education needs addressed  Geoffery Lyons, [place] Dietetic Intern Pager [phone]

## 2016-01-28 ENCOUNTER — Inpatient Hospital Stay (HOSPITAL_COMMUNITY): Payer: Medicare Other | Admitting: Physical Therapy

## 2016-01-28 ENCOUNTER — Encounter (HOSPITAL_COMMUNITY): Payer: Self-pay | Admitting: *Deleted

## 2016-01-28 ENCOUNTER — Inpatient Hospital Stay (HOSPITAL_COMMUNITY): Payer: Medicare Other | Admitting: Occupational Therapy

## 2016-01-28 ENCOUNTER — Inpatient Hospital Stay (HOSPITAL_COMMUNITY)
Admission: RE | Admit: 2016-01-28 | Discharge: 2016-02-07 | DRG: 950 | Disposition: A | Discharge status: Home Health | Payer: Medicare Other | Source: Intra-hospital | Attending: Physical Medicine & Rehabilitation | Admitting: Physical Medicine & Rehabilitation

## 2016-01-28 DIAGNOSIS — S2242XD Multiple fractures of ribs, left side, subsequent encounter for fracture with routine healing: Secondary | ICD-10-CM

## 2016-01-28 DIAGNOSIS — S32019D Unspecified fracture of first lumbar vertebra, subsequent encounter for fracture with routine healing: Secondary | ICD-10-CM

## 2016-01-28 DIAGNOSIS — Z9849 Cataract extraction status, unspecified eye: Secondary | ICD-10-CM | POA: Diagnosis not present

## 2016-01-28 DIAGNOSIS — I2510 Atherosclerotic heart disease of native coronary artery without angina pectoris: Secondary | ICD-10-CM | POA: Diagnosis present

## 2016-01-28 DIAGNOSIS — S065X1D Traumatic subdural hemorrhage with loss of consciousness of 30 minutes or less, subsequent encounter: Secondary | ICD-10-CM

## 2016-01-28 DIAGNOSIS — S020XXD Fracture of vault of skull, subsequent encounter for fracture with routine healing: Secondary | ICD-10-CM | POA: Diagnosis not present

## 2016-01-28 DIAGNOSIS — S32019S Unspecified fracture of first lumbar vertebra, sequela: Secondary | ICD-10-CM | POA: Diagnosis not present

## 2016-01-28 DIAGNOSIS — D72829 Elevated white blood cell count, unspecified: Secondary | ICD-10-CM | POA: Diagnosis not present

## 2016-01-28 DIAGNOSIS — S22069D Unspecified fracture of T7-T8 vertebra, subsequent encounter for fracture with routine healing: Secondary | ICD-10-CM

## 2016-01-28 DIAGNOSIS — R0902 Hypoxemia: Secondary | ICD-10-CM | POA: Diagnosis present

## 2016-01-28 DIAGNOSIS — S066X9D Traumatic subarachnoid hemorrhage with loss of consciousness of unspecified duration, subsequent encounter: Secondary | ICD-10-CM | POA: Diagnosis not present

## 2016-01-28 DIAGNOSIS — Z7982 Long term (current) use of aspirin: Secondary | ICD-10-CM

## 2016-01-28 DIAGNOSIS — S065X1A Traumatic subdural hemorrhage with loss of consciousness of 30 minutes or less, initial encounter: Secondary | ICD-10-CM | POA: Diagnosis present

## 2016-01-28 DIAGNOSIS — E039 Hypothyroidism, unspecified: Secondary | ICD-10-CM | POA: Diagnosis present

## 2016-01-28 DIAGNOSIS — Z8546 Personal history of malignant neoplasm of prostate: Secondary | ICD-10-CM

## 2016-01-28 DIAGNOSIS — K5900 Constipation, unspecified: Secondary | ICD-10-CM | POA: Diagnosis present

## 2016-01-28 DIAGNOSIS — E860 Dehydration: Secondary | ICD-10-CM | POA: Diagnosis not present

## 2016-01-28 DIAGNOSIS — E785 Hyperlipidemia, unspecified: Secondary | ICD-10-CM | POA: Diagnosis present

## 2016-01-28 DIAGNOSIS — I10 Essential (primary) hypertension: Secondary | ICD-10-CM | POA: Diagnosis not present

## 2016-01-28 DIAGNOSIS — S065X1S Traumatic subdural hemorrhage with loss of consciousness of 30 minutes or less, sequela: Secondary | ICD-10-CM | POA: Diagnosis not present

## 2016-01-28 DIAGNOSIS — S32018S Other fracture of first lumbar vertebra, sequela: Secondary | ICD-10-CM | POA: Diagnosis not present

## 2016-01-28 DIAGNOSIS — Z87891 Personal history of nicotine dependence: Secondary | ICD-10-CM | POA: Diagnosis not present

## 2016-01-28 DIAGNOSIS — S32018A Other fracture of first lumbar vertebra, initial encounter for closed fracture: Secondary | ICD-10-CM | POA: Diagnosis not present

## 2016-01-28 DIAGNOSIS — D649 Anemia, unspecified: Secondary | ICD-10-CM | POA: Diagnosis present

## 2016-01-28 DIAGNOSIS — S065X9A Traumatic subdural hemorrhage with loss of consciousness of unspecified duration, initial encounter: Secondary | ICD-10-CM | POA: Diagnosis present

## 2016-01-28 DIAGNOSIS — Z951 Presence of aortocoronary bypass graft: Secondary | ICD-10-CM

## 2016-01-28 DIAGNOSIS — W11XXXD Fall on and from ladder, subsequent encounter: Secondary | ICD-10-CM | POA: Diagnosis present

## 2016-01-28 DIAGNOSIS — S2242XA Multiple fractures of ribs, left side, initial encounter for closed fracture: Secondary | ICD-10-CM | POA: Diagnosis present

## 2016-01-28 DIAGNOSIS — S22069A Unspecified fracture of T7-T8 vertebra, initial encounter for closed fracture: Secondary | ICD-10-CM | POA: Diagnosis present

## 2016-01-28 DIAGNOSIS — S2242XS Multiple fractures of ribs, left side, sequela: Secondary | ICD-10-CM | POA: Diagnosis not present

## 2016-01-28 DIAGNOSIS — H3530 Unspecified macular degeneration: Secondary | ICD-10-CM | POA: Diagnosis present

## 2016-01-28 DIAGNOSIS — Z79899 Other long term (current) drug therapy: Secondary | ICD-10-CM

## 2016-01-28 DIAGNOSIS — S065XAA Traumatic subdural hemorrhage with loss of consciousness status unknown, initial encounter: Secondary | ICD-10-CM | POA: Diagnosis present

## 2016-01-28 DIAGNOSIS — S32019A Unspecified fracture of first lumbar vertebra, initial encounter for closed fracture: Secondary | ICD-10-CM | POA: Diagnosis present

## 2016-01-28 LAB — CBC WITH DIFFERENTIAL/PLATELET
BASOS PCT: 0 %
Basophils Absolute: 0 10*3/uL (ref 0.0–0.1)
Eosinophils Absolute: 0.2 10*3/uL (ref 0.0–0.7)
Eosinophils Relative: 2 %
HEMATOCRIT: 34.9 % — AB (ref 39.0–52.0)
HEMOGLOBIN: 11.1 g/dL — AB (ref 13.0–17.0)
LYMPHS ABS: 0.6 10*3/uL — AB (ref 0.7–4.0)
Lymphocytes Relative: 6 %
MCH: 29.1 pg (ref 26.0–34.0)
MCHC: 31.8 g/dL (ref 30.0–36.0)
MCV: 91.6 fL (ref 78.0–100.0)
MONOS PCT: 11 %
Monocytes Absolute: 1 10*3/uL (ref 0.1–1.0)
NEUTROS ABS: 7.4 10*3/uL (ref 1.7–7.7)
NEUTROS PCT: 81 %
Platelets: 196 10*3/uL (ref 150–400)
RBC: 3.81 MIL/uL — AB (ref 4.22–5.81)
RDW: 13.9 % (ref 11.5–15.5)
WBC: 9.2 10*3/uL (ref 4.0–10.5)

## 2016-01-28 LAB — COMPREHENSIVE METABOLIC PANEL
ALBUMIN: 2.3 g/dL — AB (ref 3.5–5.0)
ALK PHOS: 76 U/L (ref 38–126)
ALT: 34 U/L (ref 17–63)
ANION GAP: 9 (ref 5–15)
AST: 63 U/L — ABNORMAL HIGH (ref 15–41)
BILIRUBIN TOTAL: 1 mg/dL (ref 0.3–1.2)
BUN: 22 mg/dL — AB (ref 6–20)
CALCIUM: 8.9 mg/dL (ref 8.9–10.3)
CO2: 27 mmol/L (ref 22–32)
CREATININE: 1.05 mg/dL (ref 0.61–1.24)
Chloride: 102 mmol/L (ref 101–111)
GFR calc Af Amer: >60 mL/min (ref >60)
GFR calc non Af Amer: >60 mL/min (ref >60)
GLUCOSE: 134 mg/dL — AB (ref 65–99)
Potassium: 3.9 mmol/L (ref 3.5–5.1)
SODIUM: 138 mmol/L (ref 135–145)
TOTAL PROTEIN: 5.9 g/dL — AB (ref 6.5–8.1)

## 2016-01-28 MED ORDER — BOOST / RESOURCE BREEZE PO LIQD
1.0000 | Freq: Three times a day (TID) | ORAL | Status: DC
  Administered 2016-01-28 – 2016-02-06 (×19): 1 via ORAL

## 2016-01-28 MED ORDER — OXYCODONE HCL 5 MG PO TABS
2.5000 mg | ORAL_TABLET | Freq: Two times a day (BID) | ORAL | Status: DC
  Administered 2016-01-29 – 2016-02-05 (×13): 2.5 mg via ORAL
  Filled 2016-01-28 (×13): qty 1

## 2016-01-28 MED ORDER — ONDANSETRON HCL 4 MG PO TABS: 4.0000 mg | ORAL_TABLET | Freq: Four times a day (QID) | ORAL | Status: DC | PRN

## 2016-01-28 MED ORDER — FAMOTIDINE 20 MG PO TABS
20.0000 mg | ORAL_TABLET | Freq: Two times a day (BID) | ORAL | Status: DC
  Administered 2016-01-28 – 2016-02-07 (×20): 20 mg via ORAL
  Filled 2016-01-28 (×20): qty 1

## 2016-01-28 MED ORDER — FLEET ENEMA 7-19 GM/118ML RE ENEM: 1.0000 | ENEMA | Freq: Every day | RECTAL | Status: DC | PRN

## 2016-01-28 MED ORDER — BISACODYL 10 MG RE SUPP
10.0000 mg | Freq: Every day | RECTAL | Status: DC | PRN
Start: 2016-01-28 — End: 2016-02-07

## 2016-01-28 MED ORDER — LEVOTHYROXINE SODIUM 50 MCG PO TABS
50.0000 ug | ORAL_TABLET | Freq: Every day | ORAL | Status: DC
  Administered 2016-01-29 – 2016-02-07 (×10): 50 ug via ORAL
  Filled 2016-01-28 (×10): qty 1

## 2016-01-28 MED ORDER — ACETAMINOPHEN 325 MG PO TABS
650.0000 mg | ORAL_TABLET | Freq: Four times a day (QID) | ORAL | Status: DC
  Administered 2016-01-28 – 2016-02-05 (×25): 650 mg via ORAL
  Filled 2016-01-28 (×25): qty 2

## 2016-01-28 MED ORDER — OXYCODONE HCL 5 MG PO TABS: 2.5000 mg | ORAL_TABLET | ORAL | Status: DC | PRN

## 2016-01-28 MED ORDER — ALUM & MAG HYDROXIDE-SIMETH 200-200-20 MG/5ML PO SUSP: 30.0000 mL | ORAL | Status: DC | PRN

## 2016-01-28 MED ORDER — NITROGLYCERIN 0.4 MG SL SUBL: 0.4000 mg | SUBLINGUAL_TABLET | SUBLINGUAL | Status: DC | PRN

## 2016-01-28 MED ORDER — DOCUSATE SODIUM 100 MG PO CAPS
100.0000 mg | ORAL_CAPSULE | Freq: Two times a day (BID) | ORAL | Status: DC
  Administered 2016-01-28 – 2016-02-07 (×20): 100 mg via ORAL
  Filled 2016-01-28 (×20): qty 1

## 2016-01-28 MED ORDER — ONDANSETRON HCL 4 MG/2ML IJ SOLN: 4.0000 mg | Freq: Four times a day (QID) | INTRAMUSCULAR | Status: DC | PRN

## 2016-01-28 MED ORDER — TRAMADOL HCL 50 MG PO TABS
50.0000 mg | ORAL_TABLET | Freq: Four times a day (QID) | ORAL | Status: DC
  Administered 2016-01-28 – 2016-02-04 (×22): 50 mg via ORAL
  Filled 2016-01-28 (×24): qty 1

## 2016-01-28 MED ORDER — POLYETHYLENE GLYCOL 3350 17 G PO PACK
17.0000 g | PACK | Freq: Every day | ORAL | Status: DC
  Administered 2016-01-29 – 2016-01-30 (×2): 17 g via ORAL
  Filled 2016-01-28 (×2): qty 1

## 2016-01-28 MED ORDER — PSYLLIUM 95 % PO PACK
1.0000 | PACK | Freq: Every day | ORAL | Status: DC
  Administered 2016-01-29 – 2016-01-30 (×2): 1 via ORAL
  Filled 2016-01-28 (×2): qty 1

## 2016-01-28 NOTE — Progress Notes (Signed)
Insurance has approved an inpt rehab admission. I contacted Trauma PA to arrange. RN CM and SW made aware. I will make the arrangments to admit today. [id number]

## 2016-01-28 NOTE — Progress Notes (Signed)
I await insurance approval to admit pt to inpt rehab today. [phone] 

## 2016-01-28 NOTE — Progress Notes (Signed)
[name], RN Rehab Admission Coordinator Signed Physical Medicine and Rehabilitation PMR Pre-admission 01/27/2016 1:44 PM  Related encounter: ED to Hosp-Admission (Discharged) from 01/22/2016 in [facility]   PMR Admission Coordinator Pre-Admission Assessment  Patient: Raymond Swanson is an 82 y.o., male MRN: [id number] DOB: 04-01-1925 Height: 5\' 8"  (172.7 cm) Weight: 62.4 kg (137 lb 9.1 oz)  Insurance Information HMO: yes PPO: PCP: IPA: 80/20: OTHER: medicare advantage enhanced policy PRIMARY: Blue Medicare Policy#: [id number] Subscriber: pt CM Name: Pieter Partridge Phone#: [phone] Fax#: [phone] Pre-Cert#: tba approved for 7 days Employer: retired/[employer] Benefits: Phone #: [phone] Name: 01/27/16 Eff. Date: 08/25/15 Deduct: none Out of Pocket Max: $3500 Life Max: none CIR: $250 copay per admission then covers 100% SNF: no copay days 1-20: $40 copay per day days 21-100 Outpatient: 90% Co-Pay: 10% no visit limit Home Health: 100% Co-Pay: no visit limit DME: 80% Co-Pay: 20% Providers: in network  SECONDARY: none   Medicaid Application Date: Case Manager:  Disability Application Date: Case Worker:   Emergency Facilities manager Information    Name Relation Home Work Mobile   [place] Spouse [phone]       Current Medical History  Patient Admitting Diagnosis: Traumatic SDH after fall  History of Present Illness: [name] is a 82 y.o. right handed male with history of hypertension, prostate cancer, CAD with CABG. Presented 01/22/2016 after a fall while cleaning some gutters. Questionable loss of consciousness.  Patient completely amnesic for the event. By report patient was down for 3-4 hours before being found. CT of the head images revealed moderate left parietal scalp contusion, acute right frontotemporal subdural hematoma measuring up to 4 mm, acute subarachnoid hemorrhage throughout the right cerebral sulci. Hemorrhagic cortical contusions and subarachnoid hemorrhage bilateral anterior frontal lobes. No midline shift. Neurosurgery Dr. Saintclair Halsted advise conservative care. CT of the chest showed multiple left posterior rib fractures without pneumothorax as well as fracture of the L4 left transverse process. Compression fractures T8 and L1 suspect T8 fracture or acute but age-indeterminate.. CT cervical spine negative. Follow-up CT of the head showed interval progression of subarachnoid hemorrhage on the right and continued to monitor. Maintain on a regular diet. Hospital course pain management.  Past Medical History  Past Medical History  Diagnosis Date  . Cancer ([place])   . ACUT GASTR ULCER W/HEMORR W/O MENTION OBST 09/10/2009  . ANEMIA 10/08/2009  . CORONARY ARTERY DISEASE 01/26/2007  . HYPERLIPIDEMIA 01/26/2007  . HYPERTENSION 01/26/2007  . HYPOTHYROIDISM 10/18/2007  . MELENA 08/28/2009  . PROSTATE CANCER, HX OF 01/26/2007  . SKIN CANCER, HX OF 01/26/2007    Family History  family history includes GI problems in his sister; Lung cancer (age of onset: 81) in his brother.  Prior Rehab/Hospitalizations:  Has the patient had major surgery during 100 days prior to admission? No  Current Medications   Current facility-administered medications:  . acetaminophen (TYLENOL) tablet 650 mg, 650 mg, Oral, Q6H, Earnstine Regal, PA-C, 650 mg at 01/28/16 1216 . docusate sodium (COLACE) capsule 100 mg, 100 mg, Oral, BID, Arta Bruce Kinsinger, MD, 100 mg at 01/28/16 1044 . famotidine (PEPCID) tablet 20 mg, 20 mg, Oral, BID, Earnstine Regal, PA-C, 20 mg at 01/28/16 1044 . feeding  supplement (BOOST / RESOURCE BREEZE) liquid 1 Container, 1 Container, Oral, TID BM, Earnstine Regal, PA-C, 1 Container at 01/28/16 1044 . feeding supplement (ENSURE ENLIVE) (ENSURE ENLIVE) liquid 237 mL, 237  mL, Oral, BID BM, Earnstine Regal, PA-C, 237 mL at 01/28/16 1045 . HYDROmorphone (DILAUDID) injection 0.5 mg, 0.5 mg, Intravenous, Q4H PRN, Lisette Abu, PA-C . levothyroxine (SYNTHROID, LEVOTHROID) tablet 50 mcg, 50 mcg, Oral, QAC breakfast, Mickeal Skinner, MD, 50 mcg at 01/28/16 1044 . nitroGLYCERIN (NITROSTAT) SL tablet 0.4 mg, 0.4 mg, Sublingual, Q5 min PRN, Arta Bruce Kinsinger, MD . ondansetron [facility]) tablet 4 mg, 4 mg, Oral, Q6H PRN **OR** ondansetron (ZOFRAN) injection 4 mg, 4 mg, Intravenous, Q6H PRN, Arta Bruce Kinsinger, MD . oxyCODONE (Oxy IR/ROXICODONE) immediate release tablet 2.5-7.5 mg, 2.5-7.5 mg, Oral, Q4H PRN, Earnstine Regal, PA-C, 5 mg at 01/26/16 0848 . polyethylene glycol (MIRALAX / GLYCOLAX) packet 17 g, 17 g, Oral, Daily, Lisette Abu, PA-C, 17 g at 01/28/16 1044 . psyllium (HYDROCIL/METAMUCIL) packet 1 packet, 1 packet, Oral, Daily, Earnstine Regal, PA-C, 1 packet at 01/28/16 1044 . traMADol (ULTRAM) tablet 50 mg, 50 mg, Oral, Q6H, Earnstine Regal, PA-C, 50 mg at 01/28/16 1044  Patients Current Diet: Diet regular Room service appropriate?: Yes; Fluid consistency:: Thin  Precautions / Restrictions Precautions Precautions: Fall Precaution Comments: watch O2 Sats and HR Cervical Brace: Soft collar Restrictions Weight Bearing Restrictions: No   Has the patient had 2 or more falls or a fall with injury in the past year?No  Prior Activity Level Community (5-7x/wk): pt active pta. bowls weekly, walks mile per day, etc. 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 cane   Prior Functional Level Prior Function Level of Independence: Independent Comments: Walked with his dog every day, likes to garden, wife likes to bowl  Self Care: Did the patient need help bathing, dressing, using the toilet or eating? Independent  Indoor Mobility: Did the patient need assistance with walking from room to room (with or without device)? Independent  Stairs: Did the patient need assistance with internal or external stairs (with or without device)? Independent  Functional Cognition: Did the patient need help planning regular tasks such as shopping or remembering to take medications? Independent  Current Functional Level Cognition  Overall Cognitive Status: Difficult to assess Difficult to assess due to: Hard of hearing/deaf Orientation Level: Oriented X4   Extremity Assessment (includes Sensation/Coordination)  Upper Extremity Assessment: Defer to OT evaluation  Lower Extremity Assessment: Generalized weakness    ADLs  Overall ADL's : Needs assistance/impaired Eating/Feeding: Set up, Sitting Grooming: Set up, Sitting Upper Body Bathing: Minimal assitance, Sitting Lower Body Bathing: Moderate assistance, Sit to/from stand Upper Body Dressing : Minimal assistance, Sitting Lower Body Dressing: Moderate assistance, Sit to/from stand Lower Body Dressing Details (indicate cue type and reason): Pt with difficulty managing LB ADLs secondary to pain in L ribs. Toileting- Clothing Manipulation and Hygiene: Minimal assistance (A to hold clothing while using urinal) Functional mobility during ADLs: +2 for physical assistance, Minimal assistance, Rolling walker General ADL Comments: Pt very HOH making communication difficult. Pt reports significant pain in L ribs with sit to stand from chair, limiting participation in standing ADL activities. SpO2 jumping from low 60s up to mid 90s  with poor waveform; instructed in deep breathing and pursed lip breathing but pt reports pain with both.    Mobility  Overal bed mobility: Needs Assistance Bed Mobility: Supine to Sit Supine to sit: Min assist, HOB elevated General  bed mobility comments: Pt has been in chair since we got up yesterday. RN informed of skin tear on low back. Pt sitting on wet pad as he likely spills urinal (he has been getting some urine in the urinal). Blue pressure relief pad placed in chair when pt returned.     Transfers  Overall transfer level: Needs assistance Equipment used: Rolling walker (2 wheeled) Transfers: Sit to/from Stand Sit to Stand: +2 physical assistance, Min assist Stand pivot transfers: Min assist, +2 physical assistance General transfer comment: Pt improving with mobility and tolerance for mobility. Able to ambulate today with assistance to guide RW    Ambulation / Gait / Stairs / Wheelchair Mobility  Ambulation/Gait Ambulation/Gait assistance: Min assist, +2 safety/equipment Ambulation Distance (Feet): 50 Feet Assistive device: Rolling walker (2 wheeled) Gait Pattern/deviations: Step-through pattern, Decreased stride length, Shuffle, Drifts right/left, Trunk flexed, Narrow base of support General Gait Details: patient with short shuffling steps, limited ability to clear foot from surface or to engge in functional gait, MAX cues for increased stride and cadence. Manual facilitation of posture and pacing as well as assist for control of RW.  Gait velocity: decreased significantly Gait velocity interpretation: <1.8 ft/sec, indicative of risk for recurrent falls    Posture / Balance Balance Overall balance assessment: Needs assistance Sitting-balance support: Feet supported Sitting balance-Leahy Scale: Fair Standing balance support: Bilateral upper extremity supported Standing balance-Leahy Scale: Poor    Special needs/care consideration Skin multiple echymotic areas  to back, head, arms, hips and elbow Skin tear elbow, hip and back. Sacral foam Stage 1 Bowel mgmt: continent LBM 6/1 Bladder mgmt: incontinent at times Very HOH : Has hearing aid r ear not working well. Pt's son to take it to be worked on 6/6    Previous Tripp: Spouse/significant other Lives With: Spouse Available Help at Discharge: Family, Available 24 hours/day Type of Home: [place]: One level Home Access: Stairs to enter Entrance Stairs-Rails: Right Entrance Stairs-Number of Steps: 2 Bathroom Shower/Tub: Multimedia programmer: Standard Bathroom Accessibility: Yes How Accessible: Accessible via walker [place]: No Additional Comments: one chil, son, lives in [place] Son currently here from [place]  Discharge [place] for Discharge Living Setting: Patient's home, Lives with (comment) (wife who is 83 yo and also very active) Type of Home at Discharge: House Discharge Home Layout: One level Discharge Home Access: Stairs to enter Entrance Stairs-Rails: Right Entrance Stairs-Number of Steps: 2 Discharge Bathroom Shower/Tub: Walk-in shower Discharge Bathroom Toilet: Standard Discharge Bathroom Accessibility: Yes How Accessible: Accessible via walker Does the patient have any problems obtaining your medications?: No  Social/Family/Support Systems Patient Roles: Spouse, Parent Contact Information: wife, Sunday Spillers, states call her Ethell Anticipated Caregiver: wife Anticipated Caregiver's Contact Information: see above Ability/Limitations of Caregiver: no limitations Caregiver Availability: 24/7 Discharge Plan Discussed with Primary Caregiver: Yes Is Caregiver In Agreement with Plan?: Yes Does Caregiver/Family have Issues with Lodging/Transportation while Pt is in Rehab?: No  Goals/Additional Needs Patient/Family Goal for Rehab: Mod I with PT, OT, and SLP Expected length of stay: [facility] 7 days Equipment Needs: HOH  with hearing aide r ear not functioning well, son to take to hearing aide stor 6/6/ to replace or fix Pt/Family Agrees to Admission and willing to participate: Yes Program Orientation Provided & Reviewed with Pt/Caregiver Including Roles & Responsibilities: Yes  Decrease burden of Care through IP rehab admission: n/a  Possible need for SNF placement upon discharge:not anticipated  Patient Condition: This patient's medical  and functional status has changed since the consult dated: 01/24/2016 in which the Rehabilitation Physician determined and documented that the patient's condition is appropriate for intensive rehabilitative care in an inpatient rehabilitation facility. See "History of Present Illness" (above) for medical update. Functional changes are: moderate assist. Patient's medical and functional status update has been discussed with the Rehabilitation physician and patient remains appropriate for inpatient rehabilitation. Will admit to inpatient rehab today.  Preadmission Screen Completed By: Cleatrice Burke, 01/28/2016 3:35 PM ______________________________________________________________________  Discussed status with Dr. Naaman Plummer on 01/28/16 at 1535 and received telephone approval for admission today.  Admission Coordinator: Cleatrice Burke, time 1535 Date 01/28/2016          Cosigned by: [name], MD at 01/28/2016 3:57 PM  Revision History     Date/Time User Provider Type Action   01/28/2016 3:57 PM [name], MD Physician Cosign   01/28/2016 3:36 PM [name], RN Rehab Admission Coordinator Sign

## 2016-01-28 NOTE — Progress Notes (Signed)
[name], MD Physician Signed Physical Medicine and Rehabilitation Consult Note 01/24/2016 11:56 AM  Related encounter: ED to Hosp-Admission (Discharged) from 01/22/2016 in [place] All Collapse All        Physical Medicine and Rehabilitation Consult Reason for Consult: Traumatic Subdural hematoma after fall Referring Physician: Trauma   HPI: Raymond Swanson is a 82 y.o. right handed male with history of hypertension, prostate cancer, CAD with CABG. Per chart review patient lives with 41 year old wife. Used to single-point cane prior to admission and remained very active. Presented 01/22/2016 after a fall while cleaning some gutters. Questionable loss of consciousness. Patient completely amnesic for the event. By report patient was down for 3-4 hours before being found. CT of the head images revealed moderate left parietal scalp contusion, acute right frontotemporal subdural hematoma measuring up to 4 mm, acute subarachnoid hemorrhage throughout the right cerebral sulci. Hemorrhagic cortical contusions and subarachnoid hemorrhage bilateral anterior frontal lobes. No midline shift. Neurosurgery Dr. Saintclair Halsted advise conservative care. CT of the chest showed multiple left posterior rib fractures without pneumothorax as well as fracture of the L4 left transverse process. Compression fractures T8 and L1 suspect T8 fracture or acute but age-indeterminate.. CT cervical spine negative. Follow-up CT of the head showed interval progression of subarachnoid hemorrhage on the right and continued to monitor. Maintain on a regular diet. Hospital course pain management. Physical occupational therapy evaluation completed 01/23/2016.   Review of Systems  Constitutional: Negative for fever and chills.  HENT: Positive for hearing loss.  Eyes: Negative for blurred vision and double vision.  Respiratory: Positive for shortness of breath. Negative for cough.    Cardiovascular: Positive for leg swelling. Negative for chest pain and palpitations.  Gastrointestinal: Positive for constipation. Negative for nausea and vomiting.  Genitourinary: Positive for urgency. Negative for dysuria and hematuria.  Musculoskeletal: Positive for myalgias, joint pain and falls.  Skin: Negative for rash.  Neurological: Positive for weakness and headaches. Negative for seizures.  All other systems reviewed and are negative.  Past Medical History  Diagnosis Date  . Cancer ([place])   . ACUT GASTR ULCER W/HEMORR W/O MENTION OBST 09/10/2009  . ANEMIA 10/08/2009  . CORONARY ARTERY DISEASE 01/26/2007  . HYPERLIPIDEMIA 01/26/2007  . HYPERTENSION 01/26/2007  . HYPOTHYROIDISM 10/18/2007  . MELENA 08/28/2009  . PROSTATE CANCER, HX OF 01/26/2007  . SKIN CANCER, HX OF 01/26/2007   Past Surgical History  Procedure Laterality Date  . Coronary artery bypass graft      1991  . Hernia repair      ingunial  . Prostate surgery      prostatectomy  . Mohs surgery    . Cataract extraction     Family History  Problem Relation Age of Onset  . Lung cancer Brother 66  . GI problems Sister    Social History:  reports that he quit smoking about 67 years ago. His smoking use included Cigarettes. He has a 20 pack-year smoking history. He has never used smokeless tobacco. He reports that he drinks alcohol. He reports that he does not use illicit drugs. Allergies: No Known Allergies Medications Prior to Admission  Medication Sig Dispense Refill  . aspirin 81 MG tablet Take 81 mg by mouth daily.     . calcium gluconate 500 MG tablet Take 500 mg by mouth daily.     . cholecalciferol (VITAMIN D) 1000 UNITS tablet Take 1,000 Units by mouth daily.     Marland Kitchen  diphenhydramine-acetaminophen (TYLENOL PM) 25-500 MG TABS Take 1 tablet by mouth at bedtime as needed (sleep).     . fluorouracil (EFUDEX) 5 %  cream Apply topically 2 (two) times daily. 40 g 2  . levothyroxine (SYNTHROID, LEVOTHROID) 50 MCG tablet Take 1 tablet (50 mcg total) by mouth daily. 90 tablet 3  . Melatonin 5 MG TABS Take 2.5 mg by mouth at bedtime.    . Multiple Vitamins-Minerals (PRESERVISION/LUTEIN PO) Take by mouth. Daily      . nitroGLYCERIN (NITROSTAT) 0.4 MG SL tablet Place 1 tablet (0.4 mg total) under the tongue every 5 (five) minutes as needed. 20 tablet 5  . Omega-3 Fatty Acids (OMEGA-3 FISH OIL PO) Take by mouth daily.    . simvastatin (ZOCOR) 20 MG tablet Take 1 tablet (20 mg total) by mouth daily. 90 tablet 3    Home: Home Living Family/patient expects to be discharged to:: Private residence Living Arrangements: Spouse/significant other (5 years younger and very active/independent) Available Help at Discharge: Family, Available 24 hours/day Type of Home: House Home Access: Stairs to enter Technical brewer of Steps: 2 Entrance Stairs-Rails: Right Home Layout: One level Bathroom Shower/Tub: Multimedia programmer: Standard Home Equipment: Cane - single point  Functional History: Prior Function Level of Independence: Independent Comments: Walked with his dog every day, likes to garden, wife likes to bowl Functional Status:  Mobility: Bed Mobility Overal bed mobility: Needs Assistance Bed Mobility: Supine to Sit Supine to sit: Min assist, HOB elevated General bed mobility comments: Min assist to support trunk during transition to sit and provide a hand to pull up. Pt managing his legs on his own.  Transfers Overall transfer level: Needs assistance Equipment used: 1 person hand held assist Transfers: Sit to/from Stand, Stand Pivot Transfers Sit to Stand: Min assist Stand pivot transfers: Min assist General transfer comment: Heavy reliance on hands for support and only able to stand and pivot to the chair as he was significantly limited by rib  pain.       ADL: ADL Overall ADL's : Needs assistance/impaired Eating/Feeding: Set up, Sitting Grooming: Set up, Supervision/safety, Sitting Upper Body Bathing: Minimal assitance, Sitting Lower Body Bathing: Moderate assistance, Sit to/from stand Upper Body Dressing : Minimal assistance, Sitting Lower Body Dressing: Moderate assistance, Sit to/from stand Lower Body Dressing Details (indicate cue type and reason): Pt with difficulty managing LB ADLs secondary to pain in L ribs. Functional mobility during ADLs: Minimal assistance (for sit to stand only) General ADL Comments: Pt very HOH making communication difficult. Pt reports significant pain in L ribs with sit to stand from chair, limiting participation in standing ADL activities. SpO2 jumping from low 60s up to mid 90s with poor waveform; instructed in deep breathing and pursed lip breathing but pt reports pain with both.  Cognition: Cognition Overall Cognitive Status: Within Functional Limits for tasks assessed (A&O x 4, doesn't remember the actual fall) Orientation Level: Oriented X4 Cognition Arousal/Alertness: Awake/alert Behavior During Therapy: WFL for tasks assessed/performed Overall Cognitive Status: Within Functional Limits for tasks assessed (A&O x 4, doesn't remember the actual fall) Difficult to assess due to: Hard of hearing/deaf  Blood pressure 125/101, pulse 102, temperature 98.2 F (36.8 C), temperature source Oral, resp. rate 25, height 5\' 8"  (1.727 m), weight 62.4 kg (137 lb 9.1 oz), SpO2 100 %. Physical Exam  Constitutional:  Frail 82 year old right-handed male  HENT:  Healing laceration to right scalp  Eyes: EOM are normal.  Neck: Normal range of motion. Neck  supple. No thyromegaly present.  Cardiovascular: Regular rhythm.  Respiratory:  Limited inspiratory effort clear to auscultation  GI: Soft. Bowel sounds are normal. He exhibits no distension.  Neurological: He is alert.  Patient is very hard of  hearing. Follows simple commands. Oriented to person and place as well as age. RUE 4/5. LUE 3-4/5 and limited due to pain. LE's 4/5 HF,KE and 4+ ADF/PF. No gross sensory abnl.  Skin: Skin is warm and dry.  Psychiatric:  Pleasant, calm and cooperative     Lab Results Last 24 Hours    No results found for this or any previous visit (from the past 24 hour(s)).    Imaging Results (Last 48 hours)    Ct Head Wo Contrast  01/23/2016 CLINICAL DATA: Fall from ladder. Head injury. Follow-up subarachnoid hemorrhage. EXAM: CT HEAD WITHOUT CONTRAST TECHNIQUE: Contiguous axial images were obtained from the base of the skull through the vertex without intravenous contrast. COMPARISON: CT head 01/22/2016 FINDINGS: Progression of subarachnoid hemorrhage in the right frontal and parietal lobes. Small amount of subarachnoid hemorrhage left frontal region is unchanged. Mild left parietal subarachnoid hemorrhage has progressed. Hemorrhagic contusion right inferior frontal lobe has progressed in the interval. Small extra-axial CSF hygroma on the left is slightly larger. Right frontal subdural hematoma slightly larger. Right parietal subdural hematoma larger than the prior study. There is increased density and thickness of the right parietal subdural hematoma which now measures up to 11 mm in thickness. Small interhemispheric subdural hematoma. Minimal subdural along the right tentorium. Moderate atrophy. No shift of the midline structures. No acute infarct or mass. Nondisplaced skull fracture involving the left temporal and parietal lobe is unchanged. This is best seen on the coronal and sagittal reformations and is not seen on the axial images due to the plane of the fracture. IMPRESSION: Interval progression of subarachnoid hemorrhage on the right. Progression of right frontal and parietal subdural hematomas. Progression of left subdural hygroma. No shift of the midline structures. Progression of right frontal hemorrhagic  contusion Nondisplaced left temporal parietal skull fracture. See coronal and sagittal reformations. Electronically Signed By: Franchot Gallo M.D. On: 01/23/2016 10:51   Ct Head Wo Contrast  01/22/2016 CLINICAL DATA: Fall from ladder with head injury. Right forehead and posterior head laceration. Prostate cancer. EXAM: CT HEAD WITHOUT CONTRAST CT CERVICAL SPINE WITHOUT CONTRAST TECHNIQUE: Multidetector CT imaging of the head and cervical spine was performed following the standard protocol without intravenous contrast. Multiplanar CT image reconstructions of the cervical spine were also generated. COMPARISON: 03/12/2014 PET-CT. FINDINGS: CT HEAD FINDINGS Moderate left parietal scalp contusion. Acute right frontotemporal convexity subdural hematoma measuring up to 4 mm in thickness along the right anterior falx. Acute subarachnoid hemorrhage throughout the right cerebral sulci, predominantly right inferior frontal and temporoparietal. Hemorrhagic cortical contusions/subarachnoid hemorrhage in the bilateral anterior frontal lobes. Probable small amount of acute subarachnoid hemorrhage in the interpeduncular cistern. No mass lesion. No midline shift. Basilar cisterns remain patent. Intracranial atherosclerosis. Nonspecific mild subcortical and periventricular white matter hypodensity, most in keeping with chronic small vessel ischemic change. Cerebral volume is age appropriate. No ventriculomegaly. The visualized paranasal sinuses are essentially clear. The mastoid air cells are unopacified. No evidence of calvarial fracture. CT CERVICAL SPINE FINDINGS No fracture is detected in the cervical spine. No prevertebral soft tissue swelling. There is mild straightening of the cervical spine, usually due to positioning and/or muscle spasm. Dens is well positioned between the lateral masses of C1. The lateral masses appear well-aligned. Moderate degenerative disc  disease in the mid to lower cervical spine, most  prominent at C5-6. Mild-to-moderate bilateral facet arthropathy. No significant cervical foraminal stenosis. No cervical spine subluxation. Visualized mastoid air cells appear clear. No evidence of intra-axial hemorrhage in the visualized brain. No gross cervical canal hematoma. Centrilobular emphysema at the lung apices. No significant pulmonary nodules at the visualized lung apices. No cervical adenopathy or other significant neck soft tissue abnormality. IMPRESSION: 1. Moderate left parietal scalp contusion. 2. Acute right frontotemporal convexity subdural hematoma. 3. Acute subarachnoid hemorrhage throughout the right cerebral sulci. 4. Acute hemorrhagic cortical contusions/subarachnoid hemorrhage in the bilateral anterior frontal lobes. 5. No midline shift. Basilar cisterns remain patent. No ventriculomegaly. 6. Mild chronic small vessel ischemia. 7. No cervical spine fracture or subluxation. 8. Moderate cervical spine degenerative changes. Critical Value/emergent results were called by telephone at the time of interpretation on 01/22/2016 at 7:52 pm to Dr. Quintella Reichert , who verbally acknowledged these results. Electronically Signed By: Ilona Sorrel M.D. On: 01/22/2016 19:53   Ct Chest W Contrast  01/22/2016 CLINICAL DATA: Fall with head hematoma. EXAM: CT CHEST, ABDOMEN, AND PELVIS WITH CONTRAST TECHNIQUE: Multidetector CT imaging of the chest, abdomen and pelvis was performed following the standard protocol during bolus administration of intravenous contrast. CONTRAST: 100 mL Isovue-300 COMPARISON: 03/20/2014 FINDINGS: CT CHEST FINDINGS Mediastinum/Lymph Nodes: The thoracic aorta is heavily calcified without aneurysm. Flow in the great vessels. No evidence for a mediastinal hematoma. Coronary artery calcifications. Small hiatal hernia. Lungs/Pleura: Trachea and mainstem bronchi are patent. Motion artifact. Lucency of the upper lungs and cannot exclude emphysema. Small air-filled collections along  the medial left upper lobe adjacent to the descending thoracic aorta. These are new from the prior CT. Question a tiny nodular structure in the left upper lobe on sequence 5, image 30 which is probably stable. There is some atelectasis at both lung bases. Stable punctate nodule along the left major fissure on sequence 5, image 32. One or two small nodular densities in the superior segment of left lower lobe are new on sequence 5, image 31. Largest nodular density roughly measures 5 mm. Musculoskeletal: Fractures of left sixth, seventh, eighth, ninth and tenth ribs. No definite pneumothorax. Both shoulders are located. Visualized clavicles are intact. Prior median sternotomy. No large pneumothorax. There is a compression fracture involving the T8 vertebral body. There is an anterior wedge deformity of the T8 vertebral body with approximately 60% loss of height along the anterior aspect. CT ABDOMEN PELVIS FINDINGS Hepatobiliary: Motion artifact on this exam limits evaluation. Small low-density structures in the liver could represent small cysts. There is high-density material in the gallbladder suggestive for stones. No significant biliary dilatation. Portal venous system appears to be patent. Pancreas: No gross abnormality to the pancreas. Spleen: No gross abnormality to the spleen. No evidence for perisplenic fluid. Adrenals/Urinary Tract: Mild fullness of left adrenal gland is probably stable. No gross abnormality to the right adrenal gland. Exophytic left renal cyst. Normal appearance of the urinary bladder. Mild fullness of the renal collecting systems bilaterally. Stomach/Bowel: Small hiatal hernia. There is no significant dilatation of the bowel structures and no evidence for a bowel obstruction. There is mesenteric edema in the left lower abdomen on sequence 4, image 87. This is a difficult area to evaluate due to motion artifact throughout the study. There may also be a small amount of mesenteric edema in the  right lower abdomen. Vascular/Lymphatic: Vascular structures are heavily calcified. The infrarenal abdominal aorta measures up to 2.6 cm. No  suspicious abdominal or pelvic lymphadenopathy. Reproductive: No gross abnormality to the prostate. Other: No evidence for free air. There is edema or a small amount of fluid in the left abdominal mesentery. Musculoskeletal: Again noted is heterogeneity of the in the left ilium related to Paget's disease and this is similar to the prior examination. There is a fracture of the left L4 transverse process. Fracture of the left eleventh rib. There is also a compression deformity involving the L1 vertebral body which could be old. Marked disc space loss at L5-S1. Bilateral pars defects at L5. Minimal anterolisthesis at L5-S1. IMPRESSION: Multiple left posterior rib fractures (6 through 11) without a pneumothorax. New small air-filled collections along the medial left upper lobe could represent traumatic pneumatoceles. Few new parenchymal nodular densities in left lower lobe are nonspecific but could be also related to recent trauma. Evaluation of the intra-abdominal structures is limited due to motion artifact but there is evidence for mesenteric edema and fluid, particularly in the left abdomen and left lower quadrant. Findings raise concern for a mesenteric injury. There is no evidence for free air. Fracture of the L4 left transverse process. Compression fractures involving T8 and L1. Suspect that the T8 fracture is acute but these are age-indeterminate. These results were called by telephone at the time of interpretation on 01/22/2016 at 8:01 pm to Dr. Quintella Reichert , who verbally acknowledged these results. Electronically Signed By: Markus Daft M.D. On: 01/22/2016 20:05   Ct Cervical Spine Wo Contrast  01/22/2016 CLINICAL DATA: Fall from ladder with head injury. Right forehead and posterior head laceration. Prostate cancer. EXAM: CT HEAD WITHOUT CONTRAST CT CERVICAL  SPINE WITHOUT CONTRAST TECHNIQUE: Multidetector CT imaging of the head and cervical spine was performed following the standard protocol without intravenous contrast. Multiplanar CT image reconstructions of the cervical spine were also generated. COMPARISON: 03/12/2014 PET-CT. FINDINGS: CT HEAD FINDINGS Moderate left parietal scalp contusion. Acute right frontotemporal convexity subdural hematoma measuring up to 4 mm in thickness along the right anterior falx. Acute subarachnoid hemorrhage throughout the right cerebral sulci, predominantly right inferior frontal and temporoparietal. Hemorrhagic cortical contusions/subarachnoid hemorrhage in the bilateral anterior frontal lobes. Probable small amount of acute subarachnoid hemorrhage in the interpeduncular cistern. No mass lesion. No midline shift. Basilar cisterns remain patent. Intracranial atherosclerosis. Nonspecific mild subcortical and periventricular white matter hypodensity, most in keeping with chronic small vessel ischemic change. Cerebral volume is age appropriate. No ventriculomegaly. The visualized paranasal sinuses are essentially clear. The mastoid air cells are unopacified. No evidence of calvarial fracture. CT CERVICAL SPINE FINDINGS No fracture is detected in the cervical spine. No prevertebral soft tissue swelling. There is mild straightening of the cervical spine, usually due to positioning and/or muscle spasm. Dens is well positioned between the lateral masses of C1. The lateral masses appear well-aligned. Moderate degenerative disc disease in the mid to lower cervical spine, most prominent at C5-6. Mild-to-moderate bilateral facet arthropathy. No significant cervical foraminal stenosis. No cervical spine subluxation. Visualized mastoid air cells appear clear. No evidence of intra-axial hemorrhage in the visualized brain. No gross cervical canal hematoma. Centrilobular emphysema at the lung apices. No significant pulmonary nodules at the  visualized lung apices. No cervical adenopathy or other significant neck soft tissue abnormality. IMPRESSION: 1. Moderate left parietal scalp contusion. 2. Acute right frontotemporal convexity subdural hematoma. 3. Acute subarachnoid hemorrhage throughout the right cerebral sulci. 4. Acute hemorrhagic cortical contusions/subarachnoid hemorrhage in the bilateral anterior frontal lobes. 5. No midline shift. Basilar cisterns remain patent. No ventriculomegaly. 6.  Mild chronic small vessel ischemia. 7. No cervical spine fracture or subluxation. 8. Moderate cervical spine degenerative changes. Critical Value/emergent results were called by telephone at the time of interpretation on 01/22/2016 at 7:52 pm to Dr. Quintella Reichert , who verbally acknowledged these results. Electronically Signed By: Ilona Sorrel M.D. On: 01/22/2016 19:53   Ct Abdomen Pelvis W Contrast  01/22/2016 CLINICAL DATA: Fall with head hematoma. EXAM: CT CHEST, ABDOMEN, AND PELVIS WITH CONTRAST TECHNIQUE: Multidetector CT imaging of the chest, abdomen and pelvis was performed following the standard protocol during bolus administration of intravenous contrast. CONTRAST: 100 mL Isovue-300 COMPARISON: 03/20/2014 FINDINGS: CT CHEST FINDINGS Mediastinum/Lymph Nodes: The thoracic aorta is heavily calcified without aneurysm. Flow in the great vessels. No evidence for a mediastinal hematoma. Coronary artery calcifications. Small hiatal hernia. Lungs/Pleura: Trachea and mainstem bronchi are patent. Motion artifact. Lucency of the upper lungs and cannot exclude emphysema. Small air-filled collections along the medial left upper lobe adjacent to the descending thoracic aorta. These are new from the prior CT. Question a tiny nodular structure in the left upper lobe on sequence 5, image 30 which is probably stable. There is some atelectasis at both lung bases. Stable punctate nodule along the left major fissure on sequence 5, image 32. One or two small  nodular densities in the superior segment of left lower lobe are new on sequence 5, image 31. Largest nodular density roughly measures 5 mm. Musculoskeletal: Fractures of left sixth, seventh, eighth, ninth and tenth ribs. No definite pneumothorax. Both shoulders are located. Visualized clavicles are intact. Prior median sternotomy. No large pneumothorax. There is a compression fracture involving the T8 vertebral body. There is an anterior wedge deformity of the T8 vertebral body with approximately 60% loss of height along the anterior aspect. CT ABDOMEN PELVIS FINDINGS Hepatobiliary: Motion artifact on this exam limits evaluation. Small low-density structures in the liver could represent small cysts. There is high-density material in the gallbladder suggestive for stones. No significant biliary dilatation. Portal venous system appears to be patent. Pancreas: No gross abnormality to the pancreas. Spleen: No gross abnormality to the spleen. No evidence for perisplenic fluid. Adrenals/Urinary Tract: Mild fullness of left adrenal gland is probably stable. No gross abnormality to the right adrenal gland. Exophytic left renal cyst. Normal appearance of the urinary bladder. Mild fullness of the renal collecting systems bilaterally. Stomach/Bowel: Small hiatal hernia. There is no significant dilatation of the bowel structures and no evidence for a bowel obstruction. There is mesenteric edema in the left lower abdomen on sequence 4, image 87. This is a difficult area to evaluate due to motion artifact throughout the study. There may also be a small amount of mesenteric edema in the right lower abdomen. Vascular/Lymphatic: Vascular structures are heavily calcified. The infrarenal abdominal aorta measures up to 2.6 cm. No suspicious abdominal or pelvic lymphadenopathy. Reproductive: No gross abnormality to the prostate. Other: No evidence for free air. There is edema or a small amount of fluid in the left abdominal mesentery.  Musculoskeletal: Again noted is heterogeneity of the in the left ilium related to Paget's disease and this is similar to the prior examination. There is a fracture of the left L4 transverse process. Fracture of the left eleventh rib. There is also a compression deformity involving the L1 vertebral body which could be old. Marked disc space loss at L5-S1. Bilateral pars defects at L5. Minimal anterolisthesis at L5-S1. IMPRESSION: Multiple left posterior rib fractures (6 through 11) without a pneumothorax. New small air-filled  collections along the medial left upper lobe could represent traumatic pneumatoceles. Few new parenchymal nodular densities in left lower lobe are nonspecific but could be also related to recent trauma. Evaluation of the intra-abdominal structures is limited due to motion artifact but there is evidence for mesenteric edema and fluid, particularly in the left abdomen and left lower quadrant. Findings raise concern for a mesenteric injury. There is no evidence for free air. Fracture of the L4 left transverse process. Compression fractures involving T8 and L1. Suspect that the T8 fracture is acute but these are age-indeterminate. These results were called by telephone at the time of interpretation on 01/22/2016 at 8:01 pm to Dr. Quintella Reichert , who verbally acknowledged these results. Electronically Signed By: Markus Daft M.D. On: 01/22/2016 20:05   Dg Chest Port 1 View  01/23/2016 CLINICAL DATA: Multiple rib fractures EXAM: PORTABLE CHEST 1 VIEW COMPARISON: Portable chest x-ray and chest CT scan of Jan 22, 2016 FINDINGS: The lungs are adequately inflated. There is minimal bibasilar atelectasis which appears stable. A trace of pleural fluid on the left is suspected the interstitial markings on the left are slightly more conspicuous today. There is no pneumothorax or pneumomediastinum. The heart is top-normal in size. The pulmonary vascularity is normal. The mediastinum is normal in width.  Known left posterior rib fractures are faintly visible. IMPRESSION: Persistent bibasilar atelectasis. Atelectasis. Mild interstitial prominence on the left has developed which may reflect mild edema of cardiac or noncardiac cause. There is no pneumothorax nor large pleural effusion. Electronically Signed By: David Martinique M.D. On: 01/23/2016 07:49   Dg Chest Port 1 View  01/22/2016 CLINICAL DATA: Fall from ladder with head injury. EXAM: PORTABLE CHEST 1 VIEW COMPARISON: 08/28/2009 chest radiograph FINDINGS: Sternotomy wires appear aligned and intact. Stable cardiomediastinal silhouette with normal heart size. No pneumothorax. No pleural effusion. Bibasilar atelectasis. No pulmonary edema. Minimally displaced posterior left seventh and eighth rib fractures, which appear acute. IMPRESSION: 1. Minimally displaced acute posterior left seventh and eighth rib fractures. No pneumothorax . 2. Bibasilar atelectasis. Electronically Signed By: Ilona Sorrel M.D. On: 01/22/2016 18:30     Assessment/Plan: Diagnosis: TBI/left rib fx's after fal 1. Does the need for close, 24 hr/day medical supervision in concert with the patient's rehab needs make it unreasonable for this patient to be served in a less intensive setting? Yes 2. Co-Morbidities requiring supervision/potential complications: htn, pain mgt 3. Due to bladder management, bowel management, safety, skin/wound care, disease management, medication administration, pain management and patient education, does the patient require 24 hr/day rehab nursing? Yes 4. Does the patient require coordinated care of a physician, rehab nurse, PT (1-2 hrs/day, 5 days/week), OT (1-2 hrs/day, 5 days/week) and SLP (1-2 hrs/day, 5 days/week) to address physical and functional deficits in the context of the above medical diagnosis(es)? Yes Addressing deficits in the following areas: balance, endurance, locomotion, strength, transferring, bowel/bladder control, bathing,  dressing, feeding, grooming, toileting, cognition and psychosocial support 5. Can the patient actively participate in an intensive therapy program of at least 3 hrs of therapy per day at least 5 days per week? Yes 6. The potential for patient to make measurable gains while on inpatient rehab is excellent 7. Anticipated functional outcomes upon discharge from inpatient rehab are modified independent with PT, modified independent with OT, modified independent with SLP. 8. Estimated rehab length of stay to reach the above functional goals is: 7 days 9. Does the patient have adequate social supports and living environment to accommodate these discharge functional  goals? Yes 10. Anticipated D/C setting: Home 11. Anticipated post D/C treatments: HH therapy and Outpatient therapy 12. Overall Rehab/Functional Prognosis: excellent  RECOMMENDATIONS: This patient's condition is appropriate for continued rehabilitative care in the following setting: CIR Patient has agreed to participate in recommended program. Yes Note that insurance prior authorization may be required for reimbursement for recommended care.  Comment: Rehab Admissions Coordinator to follow up.  Thanks,  [name], MD, [facility]     01/24/2016       Revision History     Date/Time User Provider Type Action   01/24/2016 12:54 PM [name], MD Physician Sign   01/24/2016 12:10 PM Cathlyn Parsons, PA-C Physician Assistant Pend   View Details Report       Routing History     Date/Time From To Method   01/24/2016 12:54 PM [name], MD Marletta Lor, MD In Basket

## 2016-01-28 NOTE — Progress Notes (Signed)
Received phone call from inpatient rehab stating that they were able to contact patient's insurance and will be accepting patient for transfer.

## 2016-01-28 NOTE — Progress Notes (Signed)
Patient ID: Raymond Swanson, male   DOB: 1925/07/06, 82 y.o.   MRN: [id number]  LOS: 6 days   Subjective: VSS.  Afebrile. C/o chest wall pain. No sob, cp, palpitations.  Sitting up in chair eating breakfast.  Denies dysuria, he is passing flatus and tolerating POs.   Objective: Vital signs in last 24 hours: Temp:  [98 F (36.7 C)-99.3 F (37.4 C)] 98.4 F (36.9 C) (06/06 0519) Pulse Rate:  [67-101] 85 (06/06 0519) Resp:  [18-20] 18 (06/06 0519) BP: (93-114)/(50-71) 114/66 mmHg (06/06 0519) SpO2:  [90 %-97 %] 93 % (06/06 0519) Last BM Date: 01/23/16  Lab Results:  CBC No results for input(s): WBC, HGB, HCT, PLT in the last 72 hours. BMET No results for input(s): NA, K, CL, CO2, GLUCOSE, BUN, CREATININE, CALCIUM in the last 72 hours.  Imaging: No results found.   PE: General appearance: alert, cooperative and no distress Resp: clear to auscultation bilaterally Cardio: regular rate and rhythm, S1, S2 normal, no murmur, click, rub or gallop GI: soft, non-tender; bowel sounds normal; no masses,  no organomegaly Extremities: extremities normal, atraumatic, no cyanosis or edema Neurologic: Grossly normal   Patient Active Problem List   Diagnosis Date Noted  . Fall 01/27/2016  . Fracture of multiple ribs of left side 01/27/2016  . T8 vertebral fracture ([place]) 01/27/2016  . L1 vertebral fracture ([place]) 01/27/2016  . Lumbar transverse process fracture ([place]) 01/27/2016  . Pressure ulcer 01/26/2016  . Traumatic subdural hematoma ([place]) 01/22/2016  . ANEMIA 10/08/2009  . ACUT GASTR ULCER W/HEMORR W/O MENTION OBST 09/10/2009  . MELENA 08/28/2009  . Hypothyroidism 10/18/2007  . Dyslipidemia 01/26/2007  . Essential hypertension 01/26/2007  . Coronary atherosclerosis 01/26/2007  . PROSTATE CANCER, HX OF 01/26/2007  . SKIN CANCER, HX OF 01/26/2007    Assessment/Plan: Fall TBI -- per Dr. Ellene Route Multiple left rib fxs -- Pulmonary toilet T8/L1 compression fxs L4 TVP fx Multiple  medical problems -- Home meds FEN -- No issues VTE -- SCD's Dispo -- To floor, CIR when bed available   Erby Pian, ANP-BC Pager: [phone] General Trauma PA Pager: [phone]   01/28/2016 8:08 AM

## 2016-01-28 NOTE — H&P (View-Only) (Signed)
Physical Medicine and Rehabilitation Admission H&P    Chief Complaint  Patient presents with  . TBI   HPI:  Raymond Swanson is an 82 year old gentleman with history of CAD, macular degeneration, prostate cancer who was  reportedly was cleaning some gutters when he fell 3 feet off the ladder with amnesia of events. He was found down 3 or 4 hours later and was evaluated in ED. CT head/neck reavealed moderate left parietal scalp contusion, acute R-frontotemporal SDH, acute hemorrhagic cortical and SAH bilateral frontal lobes and moderate degenerative changes cervical spine. Also found to have left 6th- 11th rib fractures, new small air-filled collections LUL likely traumatic pneumoceles, mesenteric edema, L-4 transverse process fracture, compression fractures T8 (age indeterminate) and L1. Patient with complaints of HA and pain in left ribs. Dr. Saintclair Halsted evaluated patient and recommended serial CT head for monitoring. Repeat CT head 06/01 showed non-displaced left temporal parietal skull fracture and  interval progression of right SAH, right frontal and parietal hematomas, right frontal hemorrhagic contusion and left SDH. Mental status has been stable but he continues to be limited by pain left chest, tachycardia with activity and hypoxia with saturations in mid 80's with activity. Therapy ongoing and CIR recommended for follow up therapy.    Review of Systems  HENT: Positive for hearing loss.   Eyes: Negative for blurred vision and double vision.  Respiratory: Positive for shortness of breath.   Cardiovascular: Positive for chest pain (left chest wall pain). Negative for leg swelling.  Gastrointestinal: Positive for constipation. Negative for heartburn, nausea and abdominal pain.  Genitourinary: Negative for dysuria and urgency.  Musculoskeletal: Negative for myalgias, back pain and joint pain.  Skin: Negative for itching and rash.  Neurological: Positive for weakness. Negative for dizziness,  speech change, focal weakness and headaches.      Past Medical History  Diagnosis Date  . Cancer ([place])   . ACUT GASTR ULCER W/HEMORR W/O MENTION OBST 09/10/2009  . ANEMIA 10/08/2009  . CORONARY ARTERY DISEASE 01/26/2007  . HYPERLIPIDEMIA 01/26/2007  . HYPERTENSION 01/26/2007  . HYPOTHYROIDISM 10/18/2007  . MELENA 08/28/2009  . PROSTATE CANCER, HX OF 01/26/2007  . SKIN CANCER, HX OF 01/26/2007    Past Surgical History  Procedure Laterality Date  . Coronary artery bypass graft      1991  . Hernia repair      ingunial  . Prostate surgery      prostatectomy  . Mohs surgery    . Cataract extraction      Family History  Problem Relation Age of Onset  . Lung cancer Brother 27  . GI problems Sister     Social History:  Married. Retired Chief Financial Officer. Independent and walks about 1/2 mile daily. Per reports that he quit smoking about 67 years ago. His smoking use included Cigarettes. He has a 20 pack-year smoking history. He has never used smokeless tobacco.  Per reports that he drinks 2-3 oz of wine at nights. He reports that he does not use illicit drugs.    Allergies: No Known Allergies    Medications Prior to Admission  Medication Sig Dispense Refill  . aspirin 81 MG tablet Take 81 mg by mouth daily.      . calcium gluconate 500 MG tablet Take 500 mg by mouth daily.      . cholecalciferol (VITAMIN D) 1000 UNITS tablet Take 1,000 Units by mouth daily.      . diphenhydramine-acetaminophen (TYLENOL PM) 25-500 MG TABS Take 1 tablet by  mouth at bedtime as needed (sleep).     . fluorouracil (EFUDEX) 5 % cream Apply topically 2 (two) times daily. 40 g 2  . levothyroxine (SYNTHROID, LEVOTHROID) 50 MCG tablet Take 1 tablet (50 mcg total) by mouth daily. 90 tablet 3  . Melatonin 5 MG TABS Take 2.5 mg by mouth at bedtime.    . Multiple Vitamins-Minerals (PRESERVISION/LUTEIN PO) Take by mouth. Daily       . nitroGLYCERIN (NITROSTAT) 0.4 MG SL tablet Place 1 tablet (0.4 mg total) under the tongue  every 5 (five) minutes as needed. 20 tablet 5  . Omega-3 Fatty Acids (OMEGA-3 FISH OIL PO) Take by mouth daily.    . simvastatin (ZOCOR) 20 MG tablet Take 1 tablet (20 mg total) by mouth daily. 90 tablet 3    Home: Home Living Family/patient expects to be discharged to:: Private residence Living Arrangements: Spouse/significant other Available Help at Discharge: Family, Available 24 hours/day Type of Home: House Home Access: Stairs to enter Technical brewer of Steps: 2 Entrance Stairs-Rails: Right Home Layout: One level Bathroom Shower/Tub: Multimedia programmer: Programmer, systems: Yes Home Equipment: Cane - single point Additional Comments: one chil, son, lives in [place]  Lives With: Spouse   Functional History: Prior Function Level of Independence: Independent Comments: Walked with his dog every day, likes to garden, wife likes to bowl  Functional Status:  Mobility: Bed Mobility Overal bed mobility: Needs Assistance Bed Mobility: Supine to Sit Supine to sit: Min assist, HOB elevated General bed mobility comments: Pt has been in chair since we got up yesterday.  RN informed of skin tear on low back.  Pt sitting on wet pad as he likely spills urinal (he has been getting some urine in the urinal). Blue pressure relief pad placed in chair when pt returned.  Transfers Overall transfer level: Needs assistance Equipment used: Rolling walker (2 wheeled) Transfers: Sit to/from Stand Sit to Stand: +2 physical assistance, Min assist Stand pivot transfers: Min assist, +2 physical assistance General transfer comment: Pt improving with mobility and tolerance for mobility. Able to ambulate today with assistance to guide RW Ambulation/Gait Ambulation/Gait assistance: Min assist, +2 safety/equipment Ambulation Distance (Feet): 50 Feet Assistive device: Rolling walker (2 wheeled) Gait Pattern/deviations: Step-through pattern, Decreased stride length, Shuffle,  Drifts right/left, Trunk flexed, Narrow base of support General Gait Details: patient with short shuffling steps, limited ability to clear foot from surface or to engge in functional gait, MAX cues for increased stride and cadence. Manual facilitation of posture and pacing as well as assist for control of RW.  Gait velocity: decreased significantly Gait velocity interpretation: <1.8 ft/sec, indicative of risk for recurrent falls    ADL: ADL Overall ADL's : Needs assistance/impaired Eating/Feeding: Set up, Sitting Grooming: Set up, Sitting Upper Body Bathing: Minimal assitance, Sitting Lower Body Bathing: Moderate assistance, Sit to/from stand Upper Body Dressing : Minimal assistance, Sitting Lower Body Dressing: Moderate assistance, Sit to/from stand Lower Body Dressing Details (indicate cue type and reason): Pt with difficulty managing LB ADLs secondary to pain in L ribs. Toileting- Clothing Manipulation and Hygiene: Minimal assistance (A to hold clothing while using urinal) Functional mobility during ADLs: +2 for physical assistance, Minimal assistance, Rolling walker General ADL Comments: Pt very HOH making communication difficult. Pt reports significant pain in L ribs with sit to stand from chair, limiting participation in standing ADL activities. SpO2 jumping from low 60s up to mid 90s with poor waveform; instructed in deep breathing and pursed lip breathing  but pt reports pain with both.  Cognition: Cognition Overall Cognitive Status: Difficult to assess Orientation Level: Oriented X4 Cognition Arousal/Alertness: Awake/alert Behavior During Therapy: WFL for tasks assessed/performed Overall Cognitive Status: Difficult to assess Difficult to assess due to: Hard of hearing/deaf    Blood pressure 99/54, pulse 110, temperature 98.6 F (37 C), temperature source Oral, resp. rate 20, height 5\' 8"  (1.727 m), weight 62.4 kg (137 lb 9.1 oz), SpO2 98 %. Physical Exam  Nursing note and  vitals reviewed. Constitutional: He is oriented to person, place, and time. He appears well-developed and well-nourished.  Thin elderly male  HENT:  Head: Normocephalic and atraumatic.  Right Ear: External ear normal.  Dry crusted blood on scalp.   Eyes: Conjunctivae are normal. Pupils are equal, round, and reactive to light.  Neck: Normal range of motion. Neck supple. No tracheal deviation present. No thyromegaly present.  Cardiovascular: Normal rate and regular rhythm.  Exam reveals no friction rub.   No murmur heard. Respiratory: Effort normal. He has wheezes. He has no rales. He exhibits tenderness (left lateral chest).  Audible wheezing heard  GI: Soft. Bowel sounds are normal. He exhibits no distension. There is no tenderness.  Musculoskeletal: He exhibits no edema or tenderness.  Neurological: He is alert and oriented to person, place, and time.  HOH. Speech clear. Follows basic commands with visual cues ( due to hearing deficits). Seems to have reasonable insight and awareness.  RUE 4/5 prox to distal. LUE 3-4/5 and limited due to pain in chest.  LE's 4/5 HF,KE and 4+ ADF/PF. No gross sensory abnl.   Skin: Skin is warm and dry.  Psychiatric: He has a normal mood and affect. His speech is normal and behavior is normal.    No results found for this or any previous visit (from the past 48 hour(s)). No results found.     Medical Problem List and Plan: 1.  Functional, cognitive, mobility deficits secondary to TBI/polytrauma (including L1, T8 fx's) after fall  -admit to inpatient rehab 2.  DVT Prophylaxis/Anticoagulation: Mechanical: Sequential compression devices, below knee Bilateral lower extremities 3. Pain Management:  Continue ultram and tylenol qid--will schedule oxycodone prior to therapy sessions to help with activity tolerance  4. Mood: LCSW to follow for evaluation and support.  5. Neuropsych: This patient is capable of making decisions on his own behalf. 6.  Skin/Wound Care: Monitor abrasion for healing.  7. Fluids/Electrolytes/Nutrition: Monitor I/O. Offer supplements between meals.  check lytes in am.  8. CAD: On Zocor. No ASA due to bleed.  9. Hypotension: encourage fluid intake. Monitor bid.  10. Anemia: Will recheck in am.  11. Leucocytosis: Likely reactive. Monitor for signs of infection or fevers.  12. Left rib fractures with Hypoxia: Encourage IS--oxygen prn with activity and wean as tolerance improves.      Post Admission Physician Evaluation: 1. Functional deficits secondary  to TBI and Polytrauma after fall. 2. Patient is admitted to receive collaborative, interdisciplinary care between the physiatrist, rehab nursing staff, and therapy team. 3. Patient's level of medical complexity and substantial therapy needs in context of that medical necessity cannot be provided at a lesser intensity of care such as a SNF. 4. Patient has experienced substantial functional loss from his/her baseline which was documented above under the "Functional History" and "Functional Status" headings.  Judging by the patient's diagnosis, physical exam, and functional history, the patient has potential for functional progress which will result in measurable gains while on inpatient rehab.  These gains  will be of substantial and practical use upon discharge  in facilitating mobility and self-care at the household level. 5. Physiatrist will provide 24 hour management of medical needs as well as oversight of the therapy plan/treatment and provide guidance as appropriate regarding the interaction of the two. 6. 24 hour rehab nursing will assist with bladder management, bowel management, safety, skin/wound care, disease management, medication administration, pain management and patient education  and help integrate therapy concepts, techniques,education, etc. 7. PT will assess and treat for/with: Lower extremity strength, range of motion, stamina, balance, functional  mobility, safety, adaptive techniques and equipment, NMR, pain mgt, ego support, education.   Goals are: mod I to supervision. 8. OT will assess and treat for/with: ADL's, functional mobility, safety, upper extremity strength, adaptive techniques and equipment, NMR, pain mgt, community reintegration, education.   Goals are: mod I to supervision. Therapy may proceed with showering this patient. 9. SLP will assess and treat for/with: cognition, communication.  Goals are: mod I. 10. Case Management and Social Worker will assess and treat for psychological issues and discharge planning. 11. Team conference will be held weekly to assess progress toward goals and to determine barriers to discharge. 12. Patient will receive at least 3 hours of therapy per day at least 5 days per week. 13. ELOS: 8-11 days       14. Prognosis:  excellent     [name], MD, [facility] 01/28/2016   01/28/2016

## 2016-01-28 NOTE — Progress Notes (Signed)
Pt transferred to inpatient rehab. Called and gave report to [place], Therapist, sports. Pt transferred to unit via wheelchair.

## 2016-01-28 NOTE — Discharge Summary (Signed)
Physician Discharge Summary  LACEDRIC GAUGH [id number] DOB: 28-Jul-1925 DOA: 01/22/2016  PCP: Nyoka Cowden, MD  Consultation: neurosurgery---Dr. Kary Kos  Admit date: 01/22/2016 Discharge date: 01/28/2016  Recommendations for Outpatient Follow-up:   Follow-up Information    Follow up with Raymond Swanson.   Why:  As needed   Contact information:   [place] [phone] [phone]      Follow up with Raymond Hoops, MD.   Specialty:  Neurosurgery   Contact information:   [address] [place] [address] [place] [address] [phone]      Discharge Diagnoses:  1. Fall 2. TBI, SAH 3. Multiple left sided rib fractures 4. T8/L1 compression fracture   Surgical Procedure: none  Discharge Condition: stable Disposition: CIR  Diet recommendation: regular  Filed Weights   01/22/16 2332  Weight: 62.4 kg (137 lb 9.1 oz)       Hospital Course:  Raymond Swanson is a 82 year old male with a history of CAD, HTN, HLD, hypothyroidism who presented to the ED following a fall while cleaning his gutters.  He was found to have a SDU and multiple rib fractures.  The patient was admitted to the ICU and neurosurgery was consulted who recommended observation and a repeat CTH.  Follow up [place] showed evolution of SAH and a small SDH with no mass affect. Rib fractures were treated with pain control and pulmonary toilet.  He was progressed to the floor.  Mobilized with therapies who recommended inpatient rehab.  On HD#6 he was felt stable for discharge to inpatient rehab.   Discharge Instructions     Medication List    STOP taking these medications        aspirin 81 MG tablet      TAKE these medications        calcium gluconate 500 MG tablet  Take 500 mg by mouth daily.     cholecalciferol 1000 units tablet  Commonly known as:  VITAMIN D  Take 1,000 Units by mouth daily.     diphenhydramine-acetaminophen 25-500 MG  Tabs tablet  Commonly known as:  TYLENOL PM  Take 1 tablet by mouth at bedtime as needed (sleep).     fluorouracil 5 % cream  Commonly known as:  EFUDEX  Apply topically 2 (two) times daily.     levothyroxine 50 MCG tablet  Commonly known as:  SYNTHROID, LEVOTHROID  Take 1 tablet (50 mcg total) by mouth daily.     Melatonin 5 MG Tabs  Take 2.5 mg by mouth at bedtime.     nitroGLYCERIN 0.4 MG SL tablet  Commonly known as:  NITROSTAT  Place 1 tablet (0.4 mg total) under the tongue every 5 (five) minutes as needed.     OMEGA-3 FISH OIL PO  Take by mouth daily.     PRESERVISION/LUTEIN PO  Take by mouth. Daily     simvastatin 20 MG tablet  Commonly known as:  ZOCOR  Take 1 tablet (20 mg total) by mouth daily.           Follow-up Information    Follow up with Raymond Swanson.   Why:  As needed   Contact information:   [place] [phone]      Follow up with Raymond Newport, MD.   Specialty:  Neurosurgery   Contact information:   [address] [place] [address] [place] [address] [phone]  The results of significant diagnostics from this hospitalization (including imaging, microbiology, ancillary and laboratory) are listed below for reference.    Significant Diagnostic Studies: Ct Head Wo Contrast  01/23/2016  CLINICAL DATA:  Fall from ladder. Head injury. Follow-up subarachnoid hemorrhage. EXAM: CT HEAD WITHOUT CONTRAST TECHNIQUE: Contiguous axial images were obtained from the base of the skull through the vertex without intravenous contrast. COMPARISON:  CT head 01/22/2016 FINDINGS: Progression of subarachnoid hemorrhage in the right frontal and parietal lobes. Small amount of subarachnoid hemorrhage left frontal region is unchanged. Mild left parietal subarachnoid hemorrhage has progressed. Hemorrhagic contusion right inferior frontal lobe has progressed in the interval. Small extra-axial  CSF hygroma on the left is slightly larger. Right frontal subdural hematoma slightly larger. Right parietal subdural hematoma larger than the prior study. There is increased density and thickness of the right parietal subdural hematoma which now measures up to 11 mm in thickness. Small interhemispheric subdural hematoma. Minimal subdural along the right tentorium. Moderate atrophy. No shift of the midline structures. No acute infarct or mass. Nondisplaced skull fracture involving the left temporal and parietal lobe is unchanged. This is best seen on the coronal and sagittal reformations and is not seen on the axial images due to the plane of the fracture. IMPRESSION: Interval progression of subarachnoid hemorrhage on the right. Progression of right frontal and parietal subdural hematomas. Progression of left subdural hygroma. No shift of the midline structures. Progression of right frontal hemorrhagic contusion Nondisplaced left temporal parietal skull fracture. See coronal and sagittal reformations. Electronically Signed   By: Franchot Gallo M.D.   On: 01/23/2016 10:51   Ct Head Wo Contrast  01/22/2016  CLINICAL DATA:  Fall from ladder with head injury. Right forehead and posterior head laceration. Prostate cancer. EXAM: CT HEAD WITHOUT CONTRAST CT CERVICAL SPINE WITHOUT CONTRAST TECHNIQUE: Multidetector CT imaging of the head and cervical spine was performed following the standard protocol without intravenous contrast. Multiplanar CT image reconstructions of the cervical spine were also generated. COMPARISON:  03/12/2014 PET-CT. FINDINGS: CT HEAD FINDINGS Moderate left parietal scalp contusion. Acute right frontotemporal convexity subdural hematoma measuring up to 4 mm in thickness along the right anterior falx. Acute subarachnoid hemorrhage throughout the right cerebral sulci, predominantly right inferior frontal and temporoparietal. Hemorrhagic cortical contusions/subarachnoid hemorrhage in the bilateral  anterior frontal lobes. Probable small amount of acute subarachnoid hemorrhage in the interpeduncular cistern. No mass lesion. No midline shift. Basilar cisterns remain patent. Intracranial atherosclerosis. Nonspecific mild subcortical and periventricular white matter hypodensity, most in keeping with chronic small vessel ischemic change. Cerebral volume is age appropriate. No ventriculomegaly. The visualized paranasal sinuses are essentially clear. The mastoid air cells are unopacified. No evidence of calvarial fracture. CT CERVICAL SPINE FINDINGS No fracture is detected in the cervical spine. No prevertebral soft tissue swelling. There is mild straightening of the cervical spine, usually due to positioning and/or muscle spasm. Dens is well positioned between the lateral masses of C1. The lateral masses appear well-aligned. Moderate degenerative disc disease in the mid to lower cervical spine, most prominent at C5-6. Mild-to-moderate bilateral facet arthropathy. No significant cervical foraminal stenosis. No cervical spine subluxation. Visualized mastoid air cells appear clear. No evidence of intra-axial hemorrhage in the visualized brain. No gross cervical canal hematoma. Centrilobular emphysema at the lung apices. No significant pulmonary nodules at the visualized lung apices. No cervical adenopathy or other significant neck soft tissue abnormality. IMPRESSION: 1. Moderate left parietal scalp contusion. 2. Acute right frontotemporal convexity subdural hematoma. 3. Acute  subarachnoid hemorrhage throughout the right cerebral sulci. 4. Acute hemorrhagic cortical contusions/subarachnoid hemorrhage in the bilateral anterior frontal lobes. 5. No midline shift. Basilar cisterns remain patent. No ventriculomegaly. 6. Mild chronic small vessel ischemia. 7. No cervical spine fracture or subluxation. 8. Moderate cervical spine degenerative changes. Critical Value/emergent results were called by telephone at the time of  interpretation on 01/22/2016 at 7:52 pm to Dr. Quintella Reichert , who verbally acknowledged these results. Electronically Signed   By: Ilona Sorrel M.D.   On: 01/22/2016 19:53   Ct Chest W Contrast  01/22/2016  CLINICAL DATA:  Fall with head hematoma. EXAM: CT CHEST, ABDOMEN, AND PELVIS WITH CONTRAST TECHNIQUE: Multidetector CT imaging of the chest, abdomen and pelvis was performed following the standard protocol during bolus administration of intravenous contrast. CONTRAST:  100 mL Isovue-300 COMPARISON:  03/20/2014 FINDINGS: CT CHEST FINDINGS Mediastinum/Lymph Nodes: The thoracic aorta is heavily calcified without aneurysm. Flow in the great vessels. No evidence for a mediastinal hematoma. Coronary artery calcifications. Small hiatal hernia. Lungs/Pleura: Trachea and mainstem bronchi are patent. Motion artifact. Lucency of the upper lungs and cannot exclude emphysema. Small air-filled collections along the medial left upper lobe adjacent to the descending thoracic aorta. These are new from the prior CT. Question a tiny nodular structure in the left upper lobe on sequence 5, image 30 which is probably stable. There is some atelectasis at both lung bases. Stable punctate nodule along the left major fissure on sequence 5, image 32. One or two small nodular densities in the superior segment of left lower lobe are new on sequence 5, image 31. Largest nodular density roughly measures 5 mm. Musculoskeletal: Fractures of left sixth, seventh, eighth, ninth and tenth ribs. No definite pneumothorax. Both shoulders are located. Visualized clavicles are intact. Prior median sternotomy. No large pneumothorax. There is a compression fracture involving the T8 vertebral body. There is an anterior wedge deformity of the T8 vertebral body with approximately 60% loss of height along the anterior aspect. CT ABDOMEN PELVIS FINDINGS Hepatobiliary: Motion artifact on this exam limits evaluation. Small low-density structures in the liver  could represent small cysts. There is high-density material in the gallbladder suggestive for stones. No significant biliary dilatation. Portal venous system appears to be patent. Pancreas: No gross abnormality to the pancreas. Spleen: No gross abnormality to the spleen. No evidence for perisplenic fluid. Adrenals/Urinary Tract: Mild fullness of left adrenal gland is probably stable. No gross abnormality to the right adrenal gland. Exophytic left renal cyst. Normal appearance of the urinary bladder. Mild fullness of the renal collecting systems bilaterally. Stomach/Bowel: Small hiatal hernia. There is no significant dilatation of the bowel structures and no evidence for a bowel obstruction. There is mesenteric edema in the left lower abdomen on sequence 4, image 87. This is a difficult area to evaluate due to motion artifact throughout the study. There may also be a small amount of mesenteric edema in the right lower abdomen. Vascular/Lymphatic: Vascular structures are heavily calcified. The infrarenal abdominal aorta measures up to 2.6 cm. No suspicious abdominal or pelvic lymphadenopathy. Reproductive: No gross abnormality to the prostate. Other: No evidence for free air. There is edema or a small amount of fluid in the left abdominal mesentery. Musculoskeletal: Again noted is heterogeneity of the in the left ilium related to Paget's disease and this is similar to the prior examination. There is a fracture of the left L4 transverse process. Fracture of the left eleventh rib. There is also a compression deformity involving the  L1 vertebral body which could be old. Marked disc space loss at L5-S1. Bilateral pars defects at L5. Minimal anterolisthesis at L5-S1. IMPRESSION: Multiple left posterior rib fractures (6 through 11) without a pneumothorax. New small air-filled collections along the medial left upper lobe could represent traumatic pneumatoceles. Few new parenchymal nodular densities in left lower lobe are  nonspecific but could be also related to recent trauma. Evaluation of the intra-abdominal structures is limited due to motion artifact but there is evidence for mesenteric edema and fluid, particularly in the left abdomen and left lower quadrant. Findings raise concern for a mesenteric injury. There is no evidence for free air. Fracture of the L4 left transverse process. Compression fractures involving T8 and L1. Suspect that the T8 fracture is acute but these are age-indeterminate. These results were called by telephone at the time of interpretation on 01/22/2016 at 8:01 pm to Dr. Quintella Reichert , who verbally acknowledged these results. Electronically Signed   By: Markus Daft M.D.   On: 01/22/2016 20:05   Ct Cervical Spine Wo Contrast  01/22/2016  CLINICAL DATA:  Fall from ladder with head injury. Right forehead and posterior head laceration. Prostate cancer. EXAM: CT HEAD WITHOUT CONTRAST CT CERVICAL SPINE WITHOUT CONTRAST TECHNIQUE: Multidetector CT imaging of the head and cervical spine was performed following the standard protocol without intravenous contrast. Multiplanar CT image reconstructions of the cervical spine were also generated. COMPARISON:  03/12/2014 PET-CT. FINDINGS: CT HEAD FINDINGS Moderate left parietal scalp contusion. Acute right frontotemporal convexity subdural hematoma measuring up to 4 mm in thickness along the right anterior falx. Acute subarachnoid hemorrhage throughout the right cerebral sulci, predominantly right inferior frontal and temporoparietal. Hemorrhagic cortical contusions/subarachnoid hemorrhage in the bilateral anterior frontal lobes. Probable small amount of acute subarachnoid hemorrhage in the interpeduncular cistern. No mass lesion. No midline shift. Basilar cisterns remain patent. Intracranial atherosclerosis. Nonspecific mild subcortical and periventricular white matter hypodensity, most in keeping with chronic small vessel ischemic change. Cerebral volume is age  appropriate. No ventriculomegaly. The visualized paranasal sinuses are essentially clear. The mastoid air cells are unopacified. No evidence of calvarial fracture. CT CERVICAL SPINE FINDINGS No fracture is detected in the cervical spine. No prevertebral soft tissue swelling. There is mild straightening of the cervical spine, usually due to positioning and/or muscle spasm. Dens is well positioned between the lateral masses of C1. The lateral masses appear well-aligned. Moderate degenerative disc disease in the mid to lower cervical spine, most prominent at C5-6. Mild-to-moderate bilateral facet arthropathy. No significant cervical foraminal stenosis. No cervical spine subluxation. Visualized mastoid air cells appear clear. No evidence of intra-axial hemorrhage in the visualized brain. No gross cervical canal hematoma. Centrilobular emphysema at the lung apices. No significant pulmonary nodules at the visualized lung apices. No cervical adenopathy or other significant neck soft tissue abnormality. IMPRESSION: 1. Moderate left parietal scalp contusion. 2. Acute right frontotemporal convexity subdural hematoma. 3. Acute subarachnoid hemorrhage throughout the right cerebral sulci. 4. Acute hemorrhagic cortical contusions/subarachnoid hemorrhage in the bilateral anterior frontal lobes. 5. No midline shift. Basilar cisterns remain patent. No ventriculomegaly. 6. Mild chronic small vessel ischemia. 7. No cervical spine fracture or subluxation. 8. Moderate cervical spine degenerative changes. Critical Value/emergent results were called by telephone at the time of interpretation on 01/22/2016 at 7:52 pm to Dr. Quintella Reichert , who verbally acknowledged these results. Electronically Signed   By: Ilona Sorrel M.D.   On: 01/22/2016 19:53   Ct Abdomen Pelvis W Contrast  01/22/2016  CLINICAL DATA:  Fall with head hematoma. EXAM: CT CHEST, ABDOMEN, AND PELVIS WITH CONTRAST TECHNIQUE: Multidetector CT imaging of the chest, abdomen  and pelvis was performed following the standard protocol during bolus administration of intravenous contrast. CONTRAST:  100 mL Isovue-300 COMPARISON:  03/20/2014 FINDINGS: CT CHEST FINDINGS Mediastinum/Lymph Nodes: The thoracic aorta is heavily calcified without aneurysm. Flow in the great vessels. No evidence for a mediastinal hematoma. Coronary artery calcifications. Small hiatal hernia. Lungs/Pleura: Trachea and mainstem bronchi are patent. Motion artifact. Lucency of the upper lungs and cannot exclude emphysema. Small air-filled collections along the medial left upper lobe adjacent to the descending thoracic aorta. These are new from the prior CT. Question a tiny nodular structure in the left upper lobe on sequence 5, image 30 which is probably stable. There is some atelectasis at both lung bases. Stable punctate nodule along the left major fissure on sequence 5, image 32. One or two small nodular densities in the superior segment of left lower lobe are new on sequence 5, image 31. Largest nodular density roughly measures 5 mm. Musculoskeletal: Fractures of left sixth, seventh, eighth, ninth and tenth ribs. No definite pneumothorax. Both shoulders are located. Visualized clavicles are intact. Prior median sternotomy. No large pneumothorax. There is a compression fracture involving the T8 vertebral body. There is an anterior wedge deformity of the T8 vertebral body with approximately 60% loss of height along the anterior aspect. CT ABDOMEN PELVIS FINDINGS Hepatobiliary: Motion artifact on this exam limits evaluation. Small low-density structures in the liver could represent small cysts. There is high-density material in the gallbladder suggestive for stones. No significant biliary dilatation. Portal venous system appears to be patent. Pancreas: No gross abnormality to the pancreas. Spleen: No gross abnormality to the spleen. No evidence for perisplenic fluid. Adrenals/Urinary Tract: Mild fullness of left adrenal  gland is probably stable. No gross abnormality to the right adrenal gland. Exophytic left renal cyst. Normal appearance of the urinary bladder. Mild fullness of the renal collecting systems bilaterally. Stomach/Bowel: Small hiatal hernia. There is no significant dilatation of the bowel structures and no evidence for a bowel obstruction. There is mesenteric edema in the left lower abdomen on sequence 4, image 87. This is a difficult area to evaluate due to motion artifact throughout the study. There may also be a small amount of mesenteric edema in the right lower abdomen. Vascular/Lymphatic: Vascular structures are heavily calcified. The infrarenal abdominal aorta measures up to 2.6 cm. No suspicious abdominal or pelvic lymphadenopathy. Reproductive: No gross abnormality to the prostate. Other: No evidence for free air. There is edema or a small amount of fluid in the left abdominal mesentery. Musculoskeletal: Again noted is heterogeneity of the in the left ilium related to Paget's disease and this is similar to the prior examination. There is a fracture of the left L4 transverse process. Fracture of the left eleventh rib. There is also a compression deformity involving the L1 vertebral body which could be old. Marked disc space loss at L5-S1. Bilateral pars defects at L5. Minimal anterolisthesis at L5-S1. IMPRESSION: Multiple left posterior rib fractures (6 through 11) without a pneumothorax. New small air-filled collections along the medial left upper lobe could represent traumatic pneumatoceles. Few new parenchymal nodular densities in left lower lobe are nonspecific but could be also related to recent trauma. Evaluation of the intra-abdominal structures is limited due to motion artifact but there is evidence for mesenteric edema and fluid, particularly in the left abdomen and left lower quadrant. Findings raise concern for a  mesenteric injury. There is no evidence for free air. Fracture of the L4 left transverse  process. Compression fractures involving T8 and L1. Suspect that the T8 fracture is acute but these are age-indeterminate. These results were called by telephone at the time of interpretation on 01/22/2016 at 8:01 pm to Dr. Quintella Reichert , who verbally acknowledged these results. Electronically Signed   By: Markus Daft M.D.   On: 01/22/2016 20:05   Dg Chest Port 1 View  01/23/2016  CLINICAL DATA:  Multiple rib fractures EXAM: PORTABLE CHEST 1 VIEW COMPARISON:  Portable chest x-ray and chest CT scan of Jan 22, 2016 FINDINGS: The lungs are adequately inflated. There is minimal bibasilar atelectasis which appears stable. A trace of pleural fluid on the left is suspected the interstitial markings on the left are slightly more conspicuous today. There is no pneumothorax or pneumomediastinum. The heart is top-normal in size. The pulmonary vascularity is normal. The mediastinum is normal in width. Known left posterior rib fractures are faintly visible. IMPRESSION: Persistent bibasilar atelectasis. Atelectasis. Mild interstitial prominence on the left has developed which may reflect mild edema of cardiac or noncardiac cause. There is no pneumothorax nor large pleural effusion. Electronically Signed   By: David  Martinique M.D.   On: 01/23/2016 07:49   Dg Chest Port 1 View  01/22/2016  CLINICAL DATA:  Fall from ladder with head injury. EXAM: PORTABLE CHEST 1 VIEW COMPARISON:  08/28/2009 chest radiograph FINDINGS: Sternotomy wires appear aligned and intact. Stable cardiomediastinal silhouette with normal heart size. No pneumothorax. No pleural effusion. Bibasilar atelectasis. No pulmonary edema. Minimally displaced posterior left seventh and eighth rib fractures, which appear acute. IMPRESSION: 1. Minimally displaced acute posterior left seventh and eighth rib fractures. No pneumothorax . 2. Bibasilar atelectasis. Electronically Signed   By: Ilona Sorrel M.D.   On: 01/22/2016 18:30    Microbiology: Recent Results (from  the past 240 hour(s))  MRSA PCR Screening     Status: None   Collection Time: 01/22/16 11:39 PM  Result Value Ref Range Status   MRSA by PCR NEGATIVE NEGATIVE Final    Comment:        The GeneXpert MRSA Assay (FDA approved for NASAL specimens only), is one component of a comprehensive MRSA colonization surveillance program. It is not intended to diagnose MRSA infection nor to guide or monitor treatment for MRSA infections.      Labs: Basic Metabolic Panel:  Recent Labs Lab 01/22/16 1811 01/22/16 1824 01/23/16 0516 01/25/16 0411  NA 141 143 139 136  K 4.3 4.4 4.4 4.4  CL 107 103 107 103  CO2 28  --  28 28  GLUCOSE 149* 144* 183* 145*  BUN 20 24* 18 14  CREATININE 1.19 1.10 1.07 0.89  CALCIUM 9.4  --  9.0 8.9   Liver Function Tests:  Recent Labs Lab 01/22/16 1811  AST 31  ALT 24  ALKPHOS 70  BILITOT 0.6  PROT 5.9*  ALBUMIN 3.5   No results for input(s): LIPASE, AMYLASE in the last 168 hours. No results for input(s): AMMONIA in the last 168 hours. CBC:  Recent Labs Lab 01/22/16 1811 01/22/16 1824 01/23/16 0516  WBC 16.7*  --  11.8*  NEUTROABS 14.9*  --   --   HGB 13.1 14.3 11.9*  HCT 41.3 42.0 37.3*  MCV 92.8  --  92.1  PLT 153  --  149*   Cardiac Enzymes: No results for input(s): CKTOTAL, CKMB, CKMBINDEX, TROPONINI in the last 168 hours.  BNP: BNP (last 3 results) No results for input(s): BNP in the last 8760 hours.  ProBNP (last 3 results) No results for input(s): PROBNP in the last 8760 hours.  CBG: No results for input(s): GLUCAP in the last 168 hours.  Principal Problem:   Traumatic subdural hematoma (HCC) Active Problems:   Pressure ulcer   Fall   Fracture of multiple ribs of left side   T8 vertebral fracture (HCC)   L1 vertebral fracture (HCC)   Lumbar transverse process fracture ([place])   Time coordinating discharge: <30 mins   Signed:  Anaiz Qazi, ANP-BC

## 2016-01-28 NOTE — Interval H&P Note (Signed)
Raymond Swanson was admitted today to Inpatient Rehabilitation with the diagnosis of TBI with polytrauma.  The patient's history has been reviewed, patient examined, and there is no change in status.  Patient continues to be appropriate for intensive inpatient rehabilitation.  I have reviewed the patient's chart and labs.  Questions were answered to the patient's satisfaction. The PAPE has been reviewed and assessment remains appropriate.  Raymond Swanson T 01/28/2016, 5:38 PM

## 2016-01-28 NOTE — Care Management Note (Signed)
Case Management Note  Patient Details  Name: CAINON SHUBA MRN: [id number] Date of Birth: Apr 21, 1925  Subjective/Objective:  Pt medically stable for dc today.                    Action/Plan: Insurance authorization received.  Pt to dc to [facility] later today.    Expected Discharge Date:    01/28/2016             Expected Discharge Plan:  [place]  In-House Referral:     Discharge planning Services  CM Consult  Post Acute Care Choice:    Choice offered to:     DME Arranged:    DME Agency:     HH Arranged:    HH Agency:     Status of Service:  Completed, signed off  Medicare Important Message Given:  Yes Date Medicare IM Given:    Medicare IM give by:    Date Additional Medicare IM Given:    Additional Medicare Important Message give by:     If discussed at [place] of Stay Meetings, dates discussed:    Additional Comments:  Reinaldo Raddle, RN, BSN  Trauma/Neuro ICU Case Manager [phone]

## 2016-01-28 NOTE — H&P (Signed)
Physical Medicine and Rehabilitation Admission H&P    Chief Complaint  Patient presents with  . TBI   HPI:  Raymond Swanson is an 82 year old gentleman with history of CAD, macular degeneration, prostate cancer who was  reportedly was cleaning some gutters when he fell 3 feet off the ladder with amnesia of events. He was found down 3 or 4 hours later and was evaluated in ED. CT head/neck reavealed moderate left parietal scalp contusion, acute R-frontotemporal SDH, acute hemorrhagic cortical and SAH bilateral frontal lobes and moderate degenerative changes cervical spine. Also found to have left 6th- 11th rib fractures, new small air-filled collections LUL likely traumatic pneumoceles, mesenteric edema, L-4 transverse process fracture, compression fractures T8 (age indeterminate) and L1. Patient with complaints of HA and pain in left ribs. Dr. Saintclair Halsted evaluated patient and recommended serial CT head for monitoring. Repeat CT head 06/01 showed non-displaced left temporal parietal skull fracture and  interval progression of right SAH, right frontal and parietal hematomas, right frontal hemorrhagic contusion and left SDH. Mental status has been stable but he continues to be limited by pain left chest, tachycardia with activity and hypoxia with saturations in mid 80's with activity. Therapy ongoing and CIR recommended for follow up therapy.    Review of Systems  HENT: Positive for hearing loss.   Eyes: Negative for blurred vision and double vision.  Respiratory: Positive for shortness of breath.   Cardiovascular: Positive for chest pain (left chest wall pain). Negative for leg swelling.  Gastrointestinal: Positive for constipation. Negative for heartburn, nausea and abdominal pain.  Genitourinary: Negative for dysuria and urgency.  Musculoskeletal: Negative for myalgias, back pain and joint pain.  Skin: Negative for itching and rash.  Neurological: Positive for weakness. Negative for dizziness,  speech change, focal weakness and headaches.      Past Medical History  Diagnosis Date  . Cancer ([place])   . ACUT GASTR ULCER W/HEMORR W/O MENTION OBST 09/10/2009  . ANEMIA 10/08/2009  . CORONARY ARTERY DISEASE 01/26/2007  . HYPERLIPIDEMIA 01/26/2007  . HYPERTENSION 01/26/2007  . HYPOTHYROIDISM 10/18/2007  . MELENA 08/28/2009  . PROSTATE CANCER, HX OF 01/26/2007  . SKIN CANCER, HX OF 01/26/2007    Past Surgical History  Procedure Laterality Date  . Coronary artery bypass graft      1991  . Hernia repair      ingunial  . Prostate surgery      prostatectomy  . Mohs surgery    . Cataract extraction      Family History  Problem Relation Age of Onset  . Lung cancer Brother 17  . GI problems Sister     Social History:  Married. Retired Chief Financial Officer. Independent and walks about 1/2 mile daily. Per reports that he quit smoking about 67 years ago. His smoking use included Cigarettes. He has a 20 pack-year smoking history. He has never used smokeless tobacco.  Per reports that he drinks 2-3 oz of wine at nights. He reports that he does not use illicit drugs.    Allergies: No Known Allergies    Medications Prior to Admission  Medication Sig Dispense Refill  . aspirin 81 MG tablet Take 81 mg by mouth daily.      . calcium gluconate 500 MG tablet Take 500 mg by mouth daily.      . cholecalciferol (VITAMIN D) 1000 UNITS tablet Take 1,000 Units by mouth daily.      . diphenhydramine-acetaminophen (TYLENOL PM) 25-500 MG TABS Take 1 tablet by  mouth at bedtime as needed (sleep).     . fluorouracil (EFUDEX) 5 % cream Apply topically 2 (two) times daily. 40 g 2  . levothyroxine (SYNTHROID, LEVOTHROID) 50 MCG tablet Take 1 tablet (50 mcg total) by mouth daily. 90 tablet 3  . Melatonin 5 MG TABS Take 2.5 mg by mouth at bedtime.    . Multiple Vitamins-Minerals (PRESERVISION/LUTEIN PO) Take by mouth. Daily       . nitroGLYCERIN (NITROSTAT) 0.4 MG SL tablet Place 1 tablet (0.4 mg total) under the tongue  every 5 (five) minutes as needed. 20 tablet 5  . Omega-3 Fatty Acids (OMEGA-3 FISH OIL PO) Take by mouth daily.    . simvastatin (ZOCOR) 20 MG tablet Take 1 tablet (20 mg total) by mouth daily. 90 tablet 3    Home: Home Living Family/patient expects to be discharged to:: Private residence Living Arrangements: Spouse/significant other Available Help at Discharge: Family, Available 24 hours/day Type of Home: House Home Access: Stairs to enter Technical brewer of Steps: 2 Entrance Stairs-Rails: Right Home Layout: One level Bathroom Shower/Tub: Multimedia programmer: Programmer, systems: Yes Home Equipment: Cane - single point Additional Comments: one chil, son, lives in [place]  Lives With: Spouse   Functional History: Prior Function Level of Independence: Independent Comments: Walked with his dog every day, likes to garden, wife likes to bowl  Functional Status:  Mobility: Bed Mobility Overal bed mobility: Needs Assistance Bed Mobility: Supine to Sit Supine to sit: Min assist, HOB elevated General bed mobility comments: Pt has been in chair since we got up yesterday.  RN informed of skin tear on low back.  Pt sitting on wet pad as he likely spills urinal (he has been getting some urine in the urinal). Blue pressure relief pad placed in chair when pt returned.  Transfers Overall transfer level: Needs assistance Equipment used: Rolling walker (2 wheeled) Transfers: Sit to/from Stand Sit to Stand: +2 physical assistance, Min assist Stand pivot transfers: Min assist, +2 physical assistance General transfer comment: Pt improving with mobility and tolerance for mobility. Able to ambulate today with assistance to guide RW Ambulation/Gait Ambulation/Gait assistance: Min assist, +2 safety/equipment Ambulation Distance (Feet): 50 Feet Assistive device: Rolling walker (2 wheeled) Gait Pattern/deviations: Step-through pattern, Decreased stride length, Shuffle,  Drifts right/left, Trunk flexed, Narrow base of support General Gait Details: patient with short shuffling steps, limited ability to clear foot from surface or to engge in functional gait, MAX cues for increased stride and cadence. Manual facilitation of posture and pacing as well as assist for control of RW.  Gait velocity: decreased significantly Gait velocity interpretation: <1.8 ft/sec, indicative of risk for recurrent falls    ADL: ADL Overall ADL's : Needs assistance/impaired Eating/Feeding: Set up, Sitting Grooming: Set up, Sitting Upper Body Bathing: Minimal assitance, Sitting Lower Body Bathing: Moderate assistance, Sit to/from stand Upper Body Dressing : Minimal assistance, Sitting Lower Body Dressing: Moderate assistance, Sit to/from stand Lower Body Dressing Details (indicate cue type and reason): Pt with difficulty managing LB ADLs secondary to pain in L ribs. Toileting- Clothing Manipulation and Hygiene: Minimal assistance (A to hold clothing while using urinal) Functional mobility during ADLs: +2 for physical assistance, Minimal assistance, Rolling walker General ADL Comments: Pt very HOH making communication difficult. Pt reports significant pain in L ribs with sit to stand from chair, limiting participation in standing ADL activities. SpO2 jumping from low 60s up to mid 90s with poor waveform; instructed in deep breathing and pursed lip breathing  but pt reports pain with both.  Cognition: Cognition Overall Cognitive Status: Difficult to assess Orientation Level: Oriented X4 Cognition Arousal/Alertness: Awake/alert Behavior During Therapy: WFL for tasks assessed/performed Overall Cognitive Status: Difficult to assess Difficult to assess due to: Hard of hearing/deaf    Blood pressure 99/54, pulse 110, temperature 98.6 F (37 C), temperature source Oral, resp. rate 20, height 5\' 8"  (1.727 m), weight 62.4 kg (137 lb 9.1 oz), SpO2 98 %. Physical Exam  Nursing note and  vitals reviewed. Constitutional: He is oriented to person, place, and time. He appears well-developed and well-nourished.  Thin elderly male  HENT:  Head: Normocephalic and atraumatic.  Right Ear: External ear normal.  Dry crusted blood on scalp.   Eyes: Conjunctivae are normal. Pupils are equal, round, and reactive to light.  Neck: Normal range of motion. Neck supple. No tracheal deviation present. No thyromegaly present.  Cardiovascular: Normal rate and regular rhythm.  Exam reveals no friction rub.   No murmur heard. Respiratory: Effort normal. He has wheezes. He has no rales. He exhibits tenderness (left lateral chest).  Audible wheezing heard  GI: Soft. Bowel sounds are normal. He exhibits no distension. There is no tenderness.  Musculoskeletal: He exhibits no edema or tenderness.  Neurological: He is alert and oriented to person, place, and time.  HOH. Speech clear. Follows basic commands with visual cues ( due to hearing deficits). Seems to have reasonable insight and awareness.  RUE 4/5 prox to distal. LUE 3-4/5 and limited due to pain in chest.  LE's 4/5 HF,KE and 4+ ADF/PF. No gross sensory abnl.   Skin: Skin is warm and dry.  Psychiatric: He has a normal mood and affect. His speech is normal and behavior is normal.    No results found for this or any previous visit (from the past 48 hour(s)). No results found.     Medical Problem List and Plan: 1.  Functional, cognitive, mobility deficits secondary to TBI/polytrauma (including L1, T8 fx's) after fall  -admit to inpatient rehab 2.  DVT Prophylaxis/Anticoagulation: Mechanical: Sequential compression devices, below knee Bilateral lower extremities 3. Pain Management:  Continue ultram and tylenol qid--will schedule oxycodone prior to therapy sessions to help with activity tolerance  4. Mood: LCSW to follow for evaluation and support.  5. Neuropsych: This patient is capable of making decisions on his own behalf. 6.  Skin/Wound Care: Monitor abrasion for healing.  7. Fluids/Electrolytes/Nutrition: Monitor I/O. Offer supplements between meals.  check lytes in am.  8. CAD: On Zocor. No ASA due to bleed.  9. Hypotension: encourage fluid intake. Monitor bid.  10. Anemia: Will recheck in am.  11. Leucocytosis: Likely reactive. Monitor for signs of infection or fevers.  12. Left rib fractures with Hypoxia: Encourage IS--oxygen prn with activity and wean as tolerance improves.      Post Admission Physician Evaluation: 1. Functional deficits secondary  to TBI and Polytrauma after fall. 2. Patient is admitted to receive collaborative, interdisciplinary care between the physiatrist, rehab nursing staff, and therapy team. 3. Patient's level of medical complexity and substantial therapy needs in context of that medical necessity cannot be provided at a lesser intensity of care such as a SNF. 4. Patient has experienced substantial functional loss from his/her baseline which was documented above under the "Functional History" and "Functional Status" headings.  Judging by the patient's diagnosis, physical exam, and functional history, the patient has potential for functional progress which will result in measurable gains while on inpatient rehab.  These gains  will be of substantial and practical use upon discharge  in facilitating mobility and self-care at the household level. 5. Physiatrist will provide 24 hour management of medical needs as well as oversight of the therapy plan/treatment and provide guidance as appropriate regarding the interaction of the two. 6. 24 hour rehab nursing will assist with bladder management, bowel management, safety, skin/wound care, disease management, medication administration, pain management and patient education  and help integrate therapy concepts, techniques,education, etc. 7. PT will assess and treat for/with: Lower extremity strength, range of motion, stamina, balance, functional  mobility, safety, adaptive techniques and equipment, NMR, pain mgt, ego support, education.   Goals are: mod I to supervision. 8. OT will assess and treat for/with: ADL's, functional mobility, safety, upper extremity strength, adaptive techniques and equipment, NMR, pain mgt, community reintegration, education.   Goals are: mod I to supervision. Therapy may proceed with showering this patient. 9. SLP will assess and treat for/with: cognition, communication.  Goals are: mod I. 10. Case Management and Social Worker will assess and treat for psychological issues and discharge planning. 11. Team conference will be held weekly to assess progress toward goals and to determine barriers to discharge. 12. Patient will receive at least 3 hours of therapy per day at least 5 days per week. 13. ELOS: 8-11 days       14. Prognosis:  excellent     [name], MD, [facility] 01/28/2016   01/28/2016

## 2016-01-29 ENCOUNTER — Inpatient Hospital Stay (HOSPITAL_COMMUNITY): Payer: Medicare Other | Admitting: Occupational Therapy

## 2016-01-29 ENCOUNTER — Inpatient Hospital Stay (HOSPITAL_COMMUNITY): Payer: Medicare Other | Admitting: Physical Therapy

## 2016-01-29 ENCOUNTER — Inpatient Hospital Stay (HOSPITAL_COMMUNITY): Payer: Medicare Other | Admitting: Speech Pathology

## 2016-01-29 NOTE — Progress Notes (Signed)
Patient information reviewed and entered into eRehab system by Emmersyn Kratzke, RN, CRRN, PPS Coordinator.  Information including medical coding and functional independence measure will be reviewed and updated through discharge.     Per nursing patient was given "Data Collection Information Summary for Patients in Inpatient Rehabilitation Facilities with attached "Privacy Act Statement-Health Care Records" upon admission.  

## 2016-01-29 NOTE — Evaluation (Signed)
Speech Language Pathology Assessment and Plan  Patient Details  Name: Raymond Swanson MRN: [id number] Date of Birth: 04-16-1925  SLP Diagnosis:  None Rehab Potential:  Defer to OT/PT ELOS:  Defer to OT/PT   Today's Date: 01/29/2016 SLP Individual Time: 1100-1145 SLP Individual Time Calculation (min): 45 min and Today's Date: 01/29/2016 SLP Missed Time: 15 Minutes Missed Time Reason: Other (Comment) (no slp needs identified )   Problem List:  Patient Active Problem List   Diagnosis Date Noted  . Traumatic intracranial subdural hematoma (HCC) 01/28/2016  . Fall 01/27/2016  . Fracture of multiple ribs of left side 01/27/2016  . T8 vertebral fracture ([place]) 01/27/2016  . L1 vertebral fracture ([place]) 01/27/2016  . Lumbar transverse process fracture ([place]) 01/27/2016  . Pressure ulcer 01/26/2016  . Traumatic subdural hematoma with loss of consciousness of 30 minutes or less ([place]) 01/22/2016  . ANEMIA 10/08/2009  . ACUT GASTR ULCER W/HEMORR W/O MENTION OBST 09/10/2009  . MELENA 08/28/2009  . Hypothyroidism 10/18/2007  . Dyslipidemia 01/26/2007  . Essential hypertension 01/26/2007  . Coronary atherosclerosis 01/26/2007  . PROSTATE CANCER, HX OF 01/26/2007  . SKIN CANCER, HX OF 01/26/2007   Past Medical History:  Past Medical History  Diagnosis Date  . Cancer ([place])   . ACUT GASTR ULCER W/HEMORR W/O MENTION OBST 09/10/2009  . ANEMIA 10/08/2009  . CORONARY ARTERY DISEASE 01/26/2007  . HYPERLIPIDEMIA 01/26/2007  . HYPERTENSION 01/26/2007  . HYPOTHYROIDISM 10/18/2007  . MELENA 08/28/2009  . PROSTATE CANCER, HX OF 01/26/2007  . SKIN CANCER, HX OF 01/26/2007   Past Surgical History:  Past Surgical History  Procedure Laterality Date  . Coronary artery bypass graft      1991  . Hernia repair      ingunial  . Prostate surgery      prostatectomy  . Mohs surgery    . Cataract extraction      Assessment / Plan / Recommendation Clinical Impression Raymond Swanson is an 82 year old gentleman with  history of CAD, macular degeneration, prostate cancer who was reportedly was cleaning some gutters when he fell 3 feet off the ladder with amnesia of events. He was found down 3 or 4 hours later and was evaluated in ED. CT head/neck reavealed moderate left parietal scalp contusion, acute R-frontotemporal SDH, acute hemorrhagic cortical and SAH bilateral frontal lobes and moderate degenerative changes cervical spine. Also found to have left 6th- 11th rib fractures, new small air-filled collections LUL likely traumatic pneumoceles, mesenteric edema, L-4 transverse process fracture, compression fractures T8 (age indeterminate) and L1. Patient with complaints of HA and pain in left ribs. Dr. Saintclair Halsted evaluated patient and recommended serial CT head for monitoring. Repeat CT head 06/01 showed non-displaced left temporal parietal skull fracture and interval progression of right SAH, right frontal and parietal hematomas, right frontal hemorrhagic contusion and left SDH. Mental status has been stable but he continues to be limited by pain left chest, tachycardia with activity and hypoxia with saturations in mid 80's with activity. Therapy ongoing and CIR recommended for follow up therapy.   Patient was admitted to [facility] 01/28/16 and was seen for cognitive-linguistic evaluation 01/29/16.  Patient very [place] which impact's his ability to comprehend and complete tasks accurately; however when instructions are written patient required Mod I to complete tasks on the [facility] Cognitive Assessment and received a score of 26/30 which is considered to be WNL.  Patient with mild difficulty with recall of new information; however, cues were effective at assisting  in retrieval and patient reports this to be baseline. As a result, skilled SLP services are not warranted at this time.     Skilled Therapeutic Interventions          Cognitive-linguistic evaluation completed with results and recommendations reviewed with  patient; appears to be at baseline.  SLP signing off at this time.    SLP Assessment  Patient does not need any further Speech Lanaguage Pathology Services    Recommendations  Follow up Recommendations: None Equipment Recommended: None recommended by SLP                     Pain Pain Assessment Pain Assessment: No/denies pain Pain Score: Asleep Faces Pain Scale: Hurts even more Pain Type: Acute pain Pain Location: Rib cage Pain Onset: With Activity Pain Intervention(s): Repositioned Multiple Pain Sites: No  Prior Functioning Cognitive/Linguistic Baseline: Within functional limits Type of Home: House  Lives With: Spouse Available Help at Discharge: Family;Available 24 hours/day Vocation: Retired  Function:  Cognition Comprehension Comprehension assist level: Follows complex conversation/direction with extra time/assistive device  Expression   Expression assist level: Expresses complex ideas: With extra time/assistive device  Social Interaction Social Interaction assist level: Interacts appropriately with others with medication or extra time (anti-anxiety, antidepressant).  Problem Solving Problem solving assist level: Solves complex problems: With extra time  Memory Memory assist level: More than reasonable amount of time;Assistive device: No helper    Recommendations for other services: None  Discharge Criteria: Patient will be discharged from SLP if patient refuses treatment 3 consecutive times without medical reason, if treatment goals not met, if there is a change in medical status, if patient makes no progress towards goals or if patient is discharged from hospital.  The above assessment, treatment plan, treatment alternatives and goals were discussed and mutually agreed upon: by patient  Gunnar Fusi, M.A., CCC-SLP [place] 01/29/2016, 5:00 PM

## 2016-01-29 NOTE — Evaluation (Signed)
Physical Therapy Assessment and Plan  Patient Details  Name: Raymond Swanson MRN: [id number] Date of Birth: 01/31/1925  PT Diagnosis: Abnormal posture, Abnormality of gait, Difficulty walking, Muscle weakness and Pain in ribs Rehab Potential: Good ELOS: 10-12 days   Today's Date: 01/29/2016 PT Individual Time: 5397-6734 PT Individual Time Calculation (min): 70 min    Problem List:  Patient Active Problem List   Diagnosis Date Noted  . Traumatic intracranial subdural hematoma (HCC) 01/28/2016  . Fall 01/27/2016  . Fracture of multiple ribs of left side 01/27/2016  . T8 vertebral fracture ([place]) 01/27/2016  . L1 vertebral fracture ([place]) 01/27/2016  . Lumbar transverse process fracture ([name]) 01/27/2016  . Pressure ulcer 01/26/2016  . Traumatic subdural hematoma with loss of consciousness of 30 minutes or less ([name]) 01/22/2016  . ANEMIA 10/08/2009  . ACUT GASTR ULCER W/HEMORR W/O MENTION OBST 09/10/2009  . MELENA 08/28/2009  . Hypothyroidism 10/18/2007  . Dyslipidemia 01/26/2007  . Essential hypertension 01/26/2007  . Coronary atherosclerosis 01/26/2007  . PROSTATE CANCER, HX OF 01/26/2007  . SKIN CANCER, HX OF 01/26/2007    Past Medical History:  Past Medical History  Diagnosis Date  . Cancer ([place])   . ACUT GASTR ULCER W/HEMORR W/O MENTION OBST 09/10/2009  . ANEMIA 10/08/2009  . CORONARY ARTERY DISEASE 01/26/2007  . HYPERLIPIDEMIA 01/26/2007  . HYPERTENSION 01/26/2007  . HYPOTHYROIDISM 10/18/2007  . MELENA 08/28/2009  . PROSTATE CANCER, HX OF 01/26/2007  . SKIN CANCER, HX OF 01/26/2007   Past Surgical History:  Past Surgical History  Procedure Laterality Date  . Coronary artery bypass graft      1991  . Hernia repair      ingunial  . Prostate surgery      prostatectomy  . Mohs surgery    . Cataract extraction      Assessment & Plan Clinical Impression: Raymond Swanson is an 82 year old gentleman with history of CAD, macular degeneration, prostate cancer who was reportedly was  cleaning some gutters when he fell 3 feet off the ladder with amnesia of events. He was found down 3 or 4 hours later and was evaluated in ED. CT head/neck reavealed moderate left parietal scalp contusion, acute R-frontotemporal SDH, acute hemorrhagic cortical and SAH bilateral frontal lobes and moderate degenerative changes cervical spine. Also found to have left 6th- 11th rib fractures, new small air-filled collections LUL likely traumatic pneumoceles, mesenteric edema, L-4 transverse process fracture, compression fractures T8 (age indeterminate) and L1. Patient with complaints of HA and pain in left ribs. Dr. Saintclair Halsted evaluated patient and recommended serial CT head for monitoring. Repeat CT head 06/01 showed non-displaced left temporal parietal skull fracture and interval progression of right SAH, right frontal and parietal hematomas, right frontal hemorrhagic contusion and left SDH. Mental status has been stable but he continues to be limited by pain left chest, tachycardia with activity and hypoxia with saturations in mid 80's with activity. Patient transferred to CIR on 01/28/2016.   Patient currently requires min-mod with mobility secondary to muscle weakness and muscle joint tightness, decreased cardiorespiratoy endurance and decreased standing balance and decreased balance strategies.  Prior to hospitalization, patient was independent  with mobility and lived with Spouse in a House home.  Home access is 2Stairs to enter.  Patient will benefit from skilled PT intervention to maximize safe functional mobility, minimize fall risk and decrease caregiver burden for planned discharge home with 24 hour supervision.  Anticipate patient will benefit from follow up [place] at discharge.  PT -  End of Session Activity Tolerance: Tolerates 30+ min activity with multiple rests;Decreased this session Endurance Deficit: Yes Endurance Deficit Description: SOB and fatigue with minimal activity, required multiple prolonged  rest breaks PT Assessment Rehab Potential (ACUTE/IP ONLY): Good Barriers to Discharge: Inaccessible home environment PT Patient demonstrates impairments in the following area(s): Balance;Endurance;Motor;Nutrition;Pain PT Transfers Functional Problem(s): Bed Mobility;Bed to Chair;Car;Furniture PT Locomotion Functional Problem(s): Stairs;Wheelchair Mobility;Ambulation PT Plan PT Intensity: Minimum of 1-2 x/day ,45 to 90 minutes PT Frequency: 5 out of 7 days PT Duration Estimated Length of Stay: 10-12 days PT Treatment/Interventions: Ambulation/gait training;Balance/vestibular training;Community reintegration;Discharge planning;Disease management/prevention;DME/adaptive equipment instruction;Functional mobility training;Neuromuscular re-education;Pain management;Patient/family education;Psychosocial support;Stair training;Therapeutic Activities;Therapeutic Exercise;UE/LE Strength taining/ROM;UE/LE Coordination activities PT Transfers Anticipated Outcome(s): supervision PT Locomotion Anticipated Outcome(s): supervision PT Recommendation Follow Up Recommendations: Home health PT;24 hour supervision/assistance Patient destination: Home Equipment Recommended: To be determined Equipment Details: family has some DME from patient's mother  Skilled Therapeutic Intervention Skilled therapeutic intervention initiated after completion of evaluation. Discussed with patient falls risk, safety within room, and focus of therapy during stay. Discussed possible length of stay, goals, and follow-up therapy. Patient currently requires min-mod A overall using RW for functional mobility. Attempted ambulation with HHA and no AD but patient unable to tolerate due to pain/fatigue. Patient required max verbal/visual cues for safe hand placement with sit <> stand transfers. Patient willing to participate in evaluation but reports feeling "exhausted," requiring multiple rest breaks throughout session due to SOB and increased  HR with minimal activity. Patient attempted to toilet before requesting to return to bed but unable to void. Patient left semi reclined in bed with all needs in reach.   PT Evaluation Precautions/Restrictions Precautions Precautions: Fall Restrictions Weight Bearing Restrictions: No General Chart Reviewed: Yes Family/Caregiver Present: Yes - son present initially then departing Pain Pain Assessment Pain Assessment: 0-10 Pain Score: 0-No pain Pain Type: Acute pain Pain Location: Rib cage Pain Orientation: Right;Left Pain Descriptors / Indicators: Discomfort Pain Frequency: Intermittent Pain Onset: With Activity Patients Stated Pain Goal: 0 Pain Intervention(s): Medication (See eMAR) (scheduled pain medication) Home Living/Prior Functioning Home Living Available Help at Discharge: Family;Available 24 hours/day Type of Home: House Home Access: Stairs to enter [employer] of Steps: 2 Entrance Stairs-Rails: Right Home Layout: One level Bathroom Shower/Tub: Multimedia programmer: Standard Bathroom Accessibility: Yes  Lives With: Spouse Prior Function Level of Independence: Independent with basic ADLs;Independent with gait;Independent with transfers;Independent with homemaking with ambulation  Able to Take Stairs?: Yes Driving: Yes Vocation: Retired Leisure: Hobbies-yes (Comment) Comments: Walked with his dog every day, likes to garden, wife likes to bowl; he reports that he does most of the cooking and that he really likes to cook Vision/Perception  No change from baseline Cognition Overall Cognitive Status: Within Functional Limits for tasks assessed Arousal/Alertness: Awake/alert Memory: Appears intact Awareness: Appears intact Problem Solving: Appears intact Safety/Judgment: Appears intact Sensation Sensation Light Touch: Appears Intact Stereognosis: Appears Intact Hot/Cold: Appears Intact Proprioception: Appears Intact Coordination Gross  Motor Movements are Fluid and Coordinated: No (limited due to pain) Fine Motor Movements are Fluid and Coordinated: Yes Motor  Motor Motor: Within Functional Limits Motor - Skilled Clinical Observations: generalized weakness with rib pain  Mobility Bed Mobility Bed Mobility: Sit to Supine Sit to Supine: 5: Supervision;HOB flat Sit to Supine - Details (indicate cue type and reason): increased time due to pain Transfers Transfers: Yes Sit to Stand: 3: Mod assist;4: Min assist;With upper extremity assist Stand to Sit: 4: Min assist;With upper extremity assist Locomotion  Ambulation Ambulation: Yes Ambulation/Gait Assistance: 4: Min assist Ambulation Distance (Feet): 25 Feet Assistive device: Rolling walker Gait Gait: Yes Gait Pattern: Impaired Gait Pattern: Step-through pattern;Decreased stride length;Right steppage;Left steppage;Right foot flat;Left foot flat;Trunk flexed;Decreased trunk rotation Gait velocity: decreased Stairs / Additional Locomotion Stairs: Yes Stairs Assistance: 4: Min assist Stair Management Technique: Two rails;Step to pattern;Forwards Number of Stairs: 8 Height of Stairs: 3 Architect: Yes Wheelchair Assistance: 3: Mod Lexicographer: Both upper extremities Wheelchair Parts Management: Needs assistance Distance: 100 ft  Trunk/Postural Assessment  Cervical Assessment Cervical Assessment: Within Functional Limits Thoracic Assessment Thoracic Assessment: Exceptions to WFL (decreased AROM due to rib fx, forward flexed) Lumbar Assessment Lumbar Assessment: Exceptions to [facility] (posterior pelvic tilt) Postural Control Postural Control: Deficits on evaluation Protective Responses: impaired  Balance Dynamic Sitting Balance Dynamic Sitting - Level of Assistance: 5: Stand by assistance Static Standing Balance Static Standing - Level of Assistance: 4: Min assist Dynamic Standing Balance Dynamic Standing - Level of  Assistance: 3: Mod assist Extremity Assessment  RUE Assessment RUE Assessment: Within Functional Limits LUE Assessment LUE Assessment: Within Functional Limits (restricted with full shoulder ROM due to rib pain) RLE Assessment RLE Assessment: Within Functional Limits LLE Assessment LLE Assessment: Within Functional Limits   See Function Navigator for Current Functional Status.   Refer to Care Plan for Long Term Goals  Recommendations for other services: None  Discharge Criteria: Patient will be discharged from PT if patient refuses treatment 3 consecutive times without medical reason, if treatment goals not met, if there is a change in medical status, if patient makes no progress towards goals or if patient is discharged from hospital.  The above assessment, treatment plan, treatment alternatives and goals were discussed and mutually agreed upon: by patient and by family  Laretta Alstrom 01/29/2016, 1:15 PM

## 2016-01-29 NOTE — Progress Notes (Signed)
[facility] PHYSICAL MEDICINE & REHABILITATION     PROGRESS NOTE    Subjective/Complaints: Had a pretty quiet night. Appears comfortable. No sob or cough.   ROS: Pt denies fever, rash/itching, headache, blurred or double vision, nausea, vomiting, abdominal pain, diarrhea,   palpitations, dysuria, dizziness, neck or back pain, bleeding, anxiety, or depression   Objective: Vital Signs: Blood pressure 117/65, pulse 67, temperature 98.1 F (36.7 C), temperature source Oral, resp. rate 18, height 5\' 8"  (1.727 m), weight 64.774 kg (142 lb 12.8 oz), SpO2 93 %. No results found.  Recent Labs  01/28/16 1800  WBC 9.2  HGB 11.1*  HCT 34.9*  PLT 196    Recent Labs  01/28/16 1800  NA 138  K 3.9  CL 102  GLUCOSE 134*  BUN 22*  CREATININE 1.05  CALCIUM 8.9   CBG (last 3)  No results for input(s): GLUCAP in the last 72 hours.  Wt Readings from Last 3 Encounters:  01/29/16 64.774 kg (142 lb 12.8 oz)  01/22/16 62.4 kg (137 lb 9.1 oz)  12/31/15 63.504 kg (140 lb)    Physical Exam:  Constitutional: He is oriented to person, place, and time. He appears well-developed and well-nourished.  Thin elderly male  HENT:  Head: Normocephalic and atraumatic.  Right Ear: External ear normal.  Dried blood on occiput with bruising noted---dry blood on pillow  Eyes: Conjunctivae are normal. Pupils are equal, round, and reactive to light.  Neck: Normal range of motion. Neck supple. No tracheal deviation present. No thyromegaly present.  Cardiovascular: Normal rate and regular rhythm. Exam reveals no friction rub.  No murmur heard. Respiratory: Effort normal. He has no wheezes. He has no rales. He exhibits tenderness (left lateral chest).    GI: Soft. Bowel sounds are normal. He exhibits no distension. There is no tenderness.  Musculoskeletal: He exhibits no edema or tenderness.  Neurological: He is alert and oriented to person, place, and time.  HOH. Speech clear. Follows basic  commands with visual cues ( due to hearing deficits). Seems to have reasonable insight and awareness.  RUE 4/5 prox to distal. LUE 3-4/5 and limited due to pain in chest. LE's 4/5 HF,KE and 4+ ADF/PF. No gross sensory abnl.  Skin: Skin is warm and dry.  Psychiatric: He has a normal mood and affect. His speech is normal and behavior is normal.   Assessment/Plan: 1. Functional and cognitive deficits secondary to TBI with polytrauma which require 3+ hours per day of interdisciplinary therapy in a comprehensive inpatient rehab setting. Physiatrist is providing close team supervision and 24 hour management of active medical problems listed below. Physiatrist and rehab team continue to assess barriers to discharge/monitor patient progress toward functional and medical goals.  Function:  Bathing Bathing position      Bathing parts      Bathing assist        Upper Body Dressing/Undressing Upper body dressing                    Upper body assist        Lower Body Dressing/Undressing Lower body dressing                                  Lower body assist        Toileting Toileting          Toileting assist     Transfers Chair/bed transfer  Locomotion Ambulation           Wheelchair          Cognition Comprehension Comprehension assist level: Follows basic conversation/direction with extra time/assistive device  Expression Expression assist level: Expresses basic 90% of the time/requires cueing < 10% of the time.  Social Interaction Social Interaction assist level: Interacts appropriately 75 - 89% of the time - Needs redirection for appropriate language or to initiate interaction.  Problem Solving Problem solving assist level: Solves basic 25 - 49% of the time - needs direction more than half the time to initiate, plan or complete simple activities  Memory Memory assist level: Recognizes or recalls less than 25% of the time/requires  cueing greater than 75% of the time   Medical Problem List and Plan: 1. Functional, cognitive, mobility deficits secondary to TBI/polytrauma (including L1, T8 fx's) after fall -begin CIR therapies. 2. DVT Prophylaxis/Anticoagulation: Mechanical: Sequential compression devices, below knee Bilateral lower extremities 3. Pain Management: Continue ultram and tylenol qid--  scheduled oxycodone prior to therapy sessions to help with activity tolerance  4. Mood: LCSW to follow for evaluation and support.  5. Neuropsych: This patient is capable of making decisions on his own behalf. 6. Skin/Wound Care: Monitor abrasion for healing.  7. Fluids/Electrolytes/Nutrition: Monitor I/O. Encourage PO  -follow up labs   8. CAD: On Zocor. No ASA due to bleed.  9. Hypotension: encourage fluid intake. Monitor bid.  10. Anemia: labs pending  11. Leucocytosis: Likely reactive. Monitor for signs of infection or fevers.  12. Left rib fractures with Hypoxia: Encourage IS--oxygen prn with activity and wean as tolerance improves.   -pain improving   LOS (Days) 1 A FACE TO FACE EVALUATION WAS PERFORMED  SWARTZ,ZACHARY T 01/29/2016 9:10 AM

## 2016-01-29 NOTE — Progress Notes (Signed)
Initial Nutrition Assessment  DOCUMENTATION CODES:   Severe malnutrition in context of chronic illness  INTERVENTION:  Continue Boost Breeze po TID, each supplement provides 250 kcal and 9 grams of protein.  Encourage adequate PO intake.   NUTRITION DIAGNOSIS:   Malnutrition related to chronic illness as evidenced by severe depletion of body fat, severe depletion of muscle mass.  GOAL:   Patient will meet greater than or equal to 90% of their needs  MONITOR:   PO intake, Supplement acceptance, Weight trends, Labs, I & O's, Skin  REASON FOR ASSESSMENT:   Malnutrition Screening Tool    ASSESSMENT:   82 y.o. right handed male with history of hypertension, prostate cancer, CAD with CABG. Presented 01/22/2016 after a fall while cleaning some gutters. CT of the head images revealed moderate left parietal scalp contusion, acute right frontotemporal subdural hematoma measuring up to 4 mm, acute subarachnoid hemorrhage throughout the right cerebral sulci. Hemorrhagic cortical contusions and subarachnoid hemorrhage bilateral anterior frontal lobes. Pt HOH.   Meal completion has been 50%. Pt reports appetite is fine however says he does not like the food served. Pt reports eating well at home with no other difficulties. Pt with no significant weight loss per Epic weight records. Pt currently has Boost Breeze ordered and has been consuming them. Pt was offered Ensure however refused. Pt additionally refused nourishment snacks. RD to continue with current orders. Pt was encouraged to eat his food at meals and to drink his supplement for adequate nutrition.   Nutrition-Focused physical exam completed. Findings are severe fat depletion, severe muscle depletion, and no edema.   Labs and medications reviewed.   Diet Order:  Diet regular Room service appropriate?: Yes; Fluid consistency:: Thin  Skin:  Wound (see comment) (Stage I pressure ulcer on coccyx)  Last BM:  6/1  Height:   Ht  Readings from Last 1 Encounters:  01/28/16 5\' 8"  (1.727 m)    Weight:   Wt Readings from Last 1 Encounters:  01/29/16 142 lb 12.8 oz (64.774 kg)    Ideal Body Weight:  70 kg  BMI:  Body mass index is 21.72 kg/(m^2).  Estimated Nutritional Needs:   Kcal:  1600-1800  Protein:  75-85 grams  Fluid:  1.6 - 1.8 L/day  EDUCATION NEEDS:   No education needs identified at this time  Corrin Parker, MS, RD, LDN Pager # [phone] After hours/ weekend pager # [phone]

## 2016-01-29 NOTE — Evaluation (Signed)
Occupational Therapy Assessment and Plan  Patient Details  Name: Raymond Swanson MRN: [id number] Date of Birth: Mar 29, 1925  OT Diagnosis: acute pain and muscle weakness (generalized) Rehab Potential: Rehab Potential (ACUTE ONLY): Excellent ELOS: 10-12 days   Today's Date: 01/29/2016 OT Individual Time: 6389-3734 OT Individual Time Calculation (min): 75 min     Problem List:  Patient Active Problem List   Diagnosis Date Noted  . Traumatic intracranial subdural hematoma (HCC) 01/28/2016  . Fall 01/27/2016  . Fracture of multiple ribs of left side 01/27/2016  . T8 vertebral fracture ([place]) 01/27/2016  . L1 vertebral fracture ([place]) 01/27/2016  . Lumbar transverse process fracture ([place]) 01/27/2016  . Pressure ulcer 01/26/2016  . Traumatic subdural hematoma with loss of consciousness of 30 minutes or less ([place]) 01/22/2016  . ANEMIA 10/08/2009  . ACUT GASTR ULCER W/HEMORR W/O MENTION OBST 09/10/2009  . MELENA 08/28/2009  . Hypothyroidism 10/18/2007  . Dyslipidemia 01/26/2007  . Essential hypertension 01/26/2007  . Coronary atherosclerosis 01/26/2007  . PROSTATE CANCER, HX OF 01/26/2007  . SKIN CANCER, HX OF 01/26/2007    Past Medical History:  Past Medical History  Diagnosis Date  . Cancer ([place])   . ACUT GASTR ULCER W/HEMORR W/O MENTION OBST 09/10/2009  . ANEMIA 10/08/2009  . CORONARY ARTERY DISEASE 01/26/2007  . HYPERLIPIDEMIA 01/26/2007  . HYPERTENSION 01/26/2007  . HYPOTHYROIDISM 10/18/2007  . MELENA 08/28/2009  . PROSTATE CANCER, HX OF 01/26/2007  . SKIN CANCER, HX OF 01/26/2007   Past Surgical History:  Past Surgical History  Procedure Laterality Date  . Coronary artery bypass graft      1991  . Hernia repair      ingunial  . Prostate surgery      prostatectomy  . Mohs surgery    . Cataract extraction      Assessment & Plan Clinical Impression: Raymond Swanson is an 82 year old gentleman with history of CAD, macular degeneration, prostate cancer who was  reportedly was  cleaning some gutters when he fell 3 feet off the ladder with amnesia of events. He was found down 3 or 4 hours later and was evaluated in ED. CT head/neck reavealed moderate left parietal scalp contusion, acute R-frontotemporal SDH, acute hemorrhagic cortical and SAH bilateral frontal lobes and moderate degenerative changes cervical spine. Also found to have left 6th- 11th rib fractures, new small air-filled collections LUL likely traumatic pneumoceles, mesenteric edema, L-4 transverse process fracture, compression fractures T8 (age indeterminate) and L1. Patient with complaints of HA and pain in left ribs. Dr. Saintclair Halsted evaluated patient and recommended serial CT head for monitoring. Repeat CT head 06/01 showed non-displaced left temporal parietal skull fracture and  interval progression of right SAH, right frontal and parietal hematomas, right frontal hemorrhagic contusion and left SDH. Mental status has been stable but he continues to be limited by pain left chest, tachycardia with activity and hypoxia with saturations in mid 80's with activity. Therapy ongoing and CIR recommended for follow up therapy.    Patient transferred to CIR on 01/28/2016 .    Patient currently requires min with basic self-care skills secondary to muscle weakness, decreased cardiorespiratoy endurance, joint pain and decreased standing balance and decreased balance strategies.  Prior to hospitalization, patient was fully independent, active, driving.  Patient will benefit from skilled intervention to increase independence with basic self-care skills and increase level of independence with iADL prior to discharge home with care partner.  Anticipate patient will require intermittent supervision and follow up home health.  OT -  End of Session Activity Tolerance: Tolerates 10 - 20 min activity with multiple rests Endurance Deficit: Yes OT Assessment Rehab Potential (ACUTE ONLY): Excellent OT Patient demonstrates impairments in the  following area(s): Balance;Endurance;Pain OT Basic ADL's Functional Problem(s): Bathing;Dressing;Toileting OT Advanced ADL's Functional Problem(s): Simple Meal Preparation;Light Housekeeping OT Transfers Functional Problem(s): Toilet;Tub/Shower OT Additional Impairment(s): None OT Plan OT Intensity: Minimum of 1-2 x/day, 45 to 90 minutes OT Frequency: 5 out of 7 days OT Duration/Estimated Length of Stay: 10-12 days OT Treatment/Interventions: Balance/vestibular training;Discharge planning;DME/adaptive equipment instruction;Functional mobility training;Therapeutic Activities;Therapeutic Exercise;Self Care/advanced ADL retraining;Patient/family education;Neuromuscular re-education OT Self Feeding Anticipated Outcome(s): I OT Basic Self-Care Anticipated Outcome(s): supervision OT Toileting Anticipated Outcome(s): supervision OT Bathroom Transfers Anticipated Outcome(s): Supervision OT Recommendation Patient destination: Home Follow Up Recommendations: Home health OT Equipment Recommended: To be determined   Skilled Therapeutic Intervention Pt seen for initial evaluation and ADL retraining. Explained purpose of OT and reviewed OT goals. Pt is very hard of hearing, but did not have difficulty with comprehension when spoken to clearly. Pt participated well but did need to move very slowly due to pain. Pt was ambulate to toilet with RW with min A to stand and ambulate.  He only requires steady A with most self care, except for A with socks and shoes due to pain bending over.  Explained safety in room and that he can not get up by himself, he should use call light. Pt states that he clearly understands.  Pt in room with all needs met.   OT Evaluation Precautions/Restrictions  Precautions Precautions: Fall Restrictions Weight Bearing Restrictions: No   Pain Pain Assessment Pain Assessment: 0-10 Pain Score: 1  Pain Type: Acute pain Pain Location: Rib cage Pain Orientation: Right;Left Pain  Descriptors / Indicators: Discomfort Pain Frequency: Intermittent Pain Onset: With Activity Patients Stated Pain Goal: 0 Pain Intervention(s): Medication (See eMAR) Home Living/Prior [place] expects to be discharged to:: Private residence Living Arrangements: Spouse/significant other Available Help at Discharge: Family, Available 24 hours/day Type of Home: House Home Access: Stairs to enter Technical brewer of Steps: 2 Entrance Stairs-Rails: Right Home Layout: One level Bathroom Shower/Tub: Multimedia programmer: Standard  Lives With: Spouse Prior Function Level of Independence: Independent with basic ADLs, Independent with gait, Independent with transfers, Independent with homemaking with ambulation  Able to Take Stairs?: Yes Driving: Yes Vocation: Retired Comments: [place] with his dog every day, likes to garden, wife likes to bowl; he reports that he does most of the cooking and that he really likes to [employer] ADL ADL ADL Comments: steady A overall with BADLs Vision/Perception  Vision- History Patient Visual Report: No change from baseline Vision- Assessment Vision Assessment?: Yes;No apparent visual deficits Perception Comments: WFL  Cognition Overall Cognitive Status: Within Functional Limits for tasks assessed Arousal/Alertness: Awake/alert Orientation Level: Person;Place;Situation Person: Oriented Place: Oriented Situation: Oriented Year: 2017 Month: June Day of Week: Correct Memory: Appears intact Immediate Memory Recall: Sock;Blue;Bed Memory Recall: Sock;Blue;Bed Memory Recall Sock: With Cue Memory Recall Blue: Without Cue Memory Recall Bed: Without Cue Awareness: Appears intact Problem Solving: Appears intact Safety/Judgment: Appears intact Sensation Sensation Light Touch: Appears Intact Stereognosis: Appears Intact Hot/Cold: Appears Intact Proprioception: Appears Intact Coordination Gross Motor Movements  are Fluid and Coordinated: No (limited due to pain) Fine Motor Movements are Fluid and Coordinated: Yes Motor  Motor Motor - Skilled Clinical Observations: generalized weakness with rib pain Mobility    min A sit to stand and with RW Trunk/Postural Assessment  Cervical Assessment Cervical  Assessment: Within Functional Limits Thoracic Assessment Thoracic Assessment: Within Functional Limits Lumbar Assessment Lumbar Assessment: Within Functional Limits Postural Control Postural Control: Deficits on evaluation Protective Responses: decreased  Balance Dynamic Sitting Balance Dynamic Sitting - Level of Assistance: 5: Stand by assistance Static Standing Balance Static Standing - Level of Assistance: 4: Min assist Dynamic Standing Balance Dynamic Standing - Level of Assistance: 3: Mod assist Extremity/Trunk Assessment RUE Assessment RUE Assessment: Within Functional Limits LUE Assessment LUE Assessment: Within Functional Limits (restricted with full shoulder ROM due to rib pain)   See Function Navigator for Current Functional Status.   Refer to Care Plan for Long Term Goals  Recommendations for other services: None  Discharge Criteria: Patient will be discharged from OT if patient refuses treatment 3 consecutive times without medical reason, if treatment goals not met, if there is a change in medical status, if patient makes no progress towards goals or if patient is discharged from hospital.  The above assessment, treatment plan, treatment alternatives and goals were discussed and mutually agreed upon: by patient  Gandy 01/29/2016, 12:00 PM

## 2016-01-30 ENCOUNTER — Inpatient Hospital Stay (HOSPITAL_COMMUNITY): Payer: Medicare Other

## 2016-01-30 ENCOUNTER — Inpatient Hospital Stay (HOSPITAL_COMMUNITY): Payer: Medicare Other | Admitting: Physical Therapy

## 2016-01-30 ENCOUNTER — Inpatient Hospital Stay (HOSPITAL_COMMUNITY): Payer: Medicare Other | Admitting: Occupational Therapy

## 2016-01-30 DIAGNOSIS — S32018A Other fracture of first lumbar vertebra, initial encounter for closed fracture: Secondary | ICD-10-CM

## 2016-01-30 DIAGNOSIS — I10 Essential (primary) hypertension: Secondary | ICD-10-CM

## 2016-01-30 DIAGNOSIS — S065X1S Traumatic subdural hemorrhage with loss of consciousness of 30 minutes or less, sequela: Secondary | ICD-10-CM

## 2016-01-30 MED ORDER — LIDOCAINE 5 % EX PTCH
2.0000 | MEDICATED_PATCH | CUTANEOUS | Status: DC
Start: 2016-01-30 — End: 2016-02-07
  Administered 2016-01-30 – 2016-02-07 (×9): 2 via TRANSDERMAL
  Filled 2016-01-30 (×9): qty 2

## 2016-01-30 MED ORDER — SORBITOL 70 % SOLN: 60.0000 mL | Status: DC

## 2016-01-30 MED ORDER — POLYETHYLENE GLYCOL 3350 17 G PO PACK
17.0000 g | PACK | Freq: Two times a day (BID) | ORAL | Status: DC
  Administered 2016-01-30 – 2016-02-07 (×16): 17 g via ORAL
  Filled 2016-01-30 (×16): qty 1

## 2016-01-30 NOTE — Plan of Care (Signed)
Pt's plan of care adjusted to 15/7 after speaking with care team as pt unable to tolerate current therapy schedule with OT and PT.

## 2016-01-30 NOTE — Progress Notes (Signed)
Patient slept well throughout the night. Extremely hard of hearing with aid in left ear. Urine noted to be very concentrated. Encouraged to drink adequate amounts of fluid. Requested to be left asleep throughout the night and not be given any medications. Pain remained under control.

## 2016-01-30 NOTE — Progress Notes (Signed)
Occupational Therapy Session Note  Patient Details  Name: Raymond Swanson MRN: [id number] Date of Birth: 1925-01-11  Today's Date: 01/30/2016 OT Individual Time: 1435-1530 OT Individual Time Calculation (min): 55 min   Short Term Goals: Week 1:  OT Short Term Goal 1 (Week 1): Pt will be able to ambulate to toilet with close S using LRD. OT Short Term Goal 2 (Week 1): Pt will be able to don socks and shoes with min A. OT Short Term Goal 3 (Week 1): Pt will be able to reach into kitchen cupboards without LOB. OT Short Term Goal 4 (Week 1): Pt will demonstrate improved dynamic standing balance with steadying A during functional activities.  Skilled Therapeutic Interventions/Progress Updates:  Patient found supine in bed. Pt with 2/10 complaints of pain in left rib cage area; monitored this during session. Pt willing to work with therapist, as pt stated "I just want to get out of here". Pt engaged in bed mobility and able to perform supine to sit with min guard assist. From here, pt performed stand pivot transfer EOB to w/c. Pt propelled self from room to therapy gym with no rest breaks, but taking increased time. Pt engaged in dynamic standing balance/tolerance/endurance activity in therapy gym. First stand, pt able to maintain standing for ~6 minutes. Pt took ~5 minute rest break then able to stand for ~3 minutes. Therapist then assisted pt to arm bike and pt engaged in 6 minutes of ergometer exercise focusing on overall activity tolerance/endurance, level 0 - at a slow pace; 3 minutes frontwards and 3 minutes backwards - no rest breaks. Therapist assisted pt back to room and left pt seated in w/c with all needs within reach. Pt verbalized understanding of importance of calling for assistance for all needs.   Therapy Documentation Precautions:  Precautions Precautions: Fall Restrictions Weight Bearing Restrictions: No  Vital Signs: Therapy Vitals Temp: 98.5 F (36.9 C) Temp Source:  Oral Pulse Rate: (!) 101 Resp: 19 BP: 139/68 mmHg Patient Position (if appropriate): Lying Oxygen Therapy SpO2: 93 % O2 Device: Not Delivered  See Function Navigator for Current Functional Status.  Therapy/Group: Individual Therapy  Chrys Racer , MS, OTR/L, CLT  01/30/2016, 3:32 PM

## 2016-01-30 NOTE — Progress Notes (Signed)
[facility] PHYSICAL MEDICINE & REHABILITATION     PROGRESS NOTE    Subjective/Complaints: Constipated. Left rib pain still---hurts when he coughs.    ROS: Pt denies fever, rash/itching, headache, blurred or double vision, nausea, vomiting, abdominal pain, diarrhea,   palpitations, dysuria, dizziness, neck or back pain, bleeding, anxiety, or depression   Objective: Vital Signs: Blood pressure 139/68, pulse 101, temperature 98.5 F (36.9 C), temperature source Oral, resp. rate 19, height 5\' 8"  (1.727 m), weight 64.365 kg (141 lb 14.4 oz), SpO2 93 %. No results found.  Recent Labs  01/28/16 1800  WBC 9.2  HGB 11.1*  HCT 34.9*  PLT 196    Recent Labs  01/28/16 1800  NA 138  K 3.9  CL 102  GLUCOSE 134*  BUN 22*  CREATININE 1.05  CALCIUM 8.9   CBG (last 3)  No results for input(s): GLUCAP in the last 72 hours.  Wt Readings from Last 3 Encounters:  01/30/16 64.365 kg (141 lb 14.4 oz)  01/22/16 62.4 kg (137 lb 9.1 oz)  12/31/15 63.504 kg (140 lb)    Physical Exam:  Constitutional: He is oriented to person, place, and time. He appears well-developed and well-nourished.  Thin elderly male  HENT:  Head: Normocephalic and atraumatic.  Right Ear: External ear normal.  Dried blood on occiput with bruising noted---dry blood on pillow  Eyes: Conjunctivae are normal. Pupils are equal, round, and reactive to light.  Neck: Normal range of motion. Neck supple. No tracheal deviation present. No thyromegaly present.  Cardiovascular: Normal rate and regular rhythm. Exam reveals no friction rub.  No murmur heard. Respiratory: Effort normal. He has no wheezes. He has no rales. He exhibits tenderness (left lateral chest).    GI: Soft. Bowel sounds are normal. He exhibits no distension. There is no tenderness.  Musculoskeletal: He exhibits no edema or tenderness.  Neurological: He is alert and oriented to person, place, and time.  HOH. Speech clear. Follows basic commands  with visual cues ( due to hearing deficits). Seems to have reasonable insight and awareness.  RUE 4/5 prox to distal. LUE 3-4/5 and limited due to pain in chest. LE's 4/5 HF,KE and 4+ ADF/PF. No gross sensory abnl.  Skin: Skin is warm and dry.  Psychiatric: He has a normal mood and affect. His speech is normal and behavior is normal.   Assessment/Plan: 1. Functional and cognitive deficits secondary to TBI with polytrauma which require 3+ hours per day of interdisciplinary therapy in a comprehensive inpatient rehab setting. Physiatrist is providing close team supervision and 24 hour management of active medical problems listed below. Physiatrist and rehab team continue to assess barriers to discharge/monitor patient progress toward functional and medical goals.  Function:  Bathing Bathing position   Position: Wheelchair/chair at sink  Bathing parts Body parts bathed by patient: Right arm, Left arm, Chest, Abdomen, Front perineal area, Buttocks, Right upper leg, Left upper leg, Left lower leg Body parts bathed by helper: Right lower leg, Back  Bathing assist Assist Level: Touching or steadying assistance(Pt > 75%)      Upper Body Dressing/Undressing Upper body dressing   What is the patient wearing?: Button up shirt         Button up shirt - Perfomed by patient: Thread/unthread right sleeve, Thread/unthread left sleeve, Pull shirt around back, Button/unbutton shirt      Upper body assist Assist Level: Set up      Lower Body Dressing/Undressing Lower body dressing   What is the  patient wearing?: Underwear, Pants, Socks, Shoes Underwear - Performed by patient: Thread/unthread right underwear leg, Thread/unthread left underwear leg, Pull underwear up/down   Pants- Performed by patient: Thread/unthread right pants leg, Thread/unthread left pants leg, Pull pants up/down, Fasten/unfasten pants         Socks - Performed by helper: Don/doff right sock, Don/doff left sock    Shoes - Performed by helper: Don/doff right shoe, Don/doff left shoe, Fasten right, Fasten left          Lower body assist Assist for lower body dressing: Touching or steadying assistance (Pt > 75%)      Toileting Toileting   Toileting steps completed by patient: Adjust clothing prior to toileting, Performs perineal hygiene, Adjust clothing after toileting      Toileting assist Assist level: Touching or steadying assistance (Pt.75%)   Transfers Chair/bed transfer   Chair/bed transfer method: Ambulatory Chair/bed transfer assist level: Moderate assist (Pt 50 - 74%/lift or lower) Chair/bed transfer assistive device: Armrests, Medical sales representative     Max distance: 25 Assist level: Touching or steadying assistance (Pt > 75%)   Wheelchair   Type: Manual Max wheelchair distance: 100 Assist Level: Moderate assistance (Pt 50 - 74%)  Cognition Comprehension Comprehension assist level: Follows complex conversation/direction with extra time/assistive device  Expression Expression assist level: Expresses complex ideas: With extra time/assistive device  Social Interaction Social Interaction assist level: Interacts appropriately with others with medication or extra time (anti-anxiety, antidepressant).  Problem Solving Problem solving assist level: Solves complex problems: With extra time  Memory Memory assist level: More than reasonable amount of time   Medical Problem List and Plan: 1. Functional, cognitive, mobility deficits secondary to TBI/polytrauma (including L1, T8 fx's) after fall -continue CIR therapies. 2. DVT Prophylaxis/Anticoagulation: Mechanical: Sequential compression devices, below knee Bilateral lower extremities 3. Pain Management: Continue ultram and tylenol qid--  scheduled oxycodone prior to therapy sessions to help with activity tolerance  4. Mood: LCSW to follow for evaluation and support.  5. Neuropsych: This patient is capable of  making decisions on his own behalf. 6. Skin/Wound Care: Monitor abrasion for healing.  7. Fluids/Electrolytes/Nutrition: Monitor I/O. Encourage PO  -follow up labs   8. CAD: On Zocor. No ASA due to bleed.  9. Hypotension: encourage fluid intake. Monitor bid.  10. Anemia: labs pending  11. Leucocytosis: Likely reactive. Monitor for signs of infection or fevers.  12. Left rib fractures with Hypoxia: Encourage IS--oxygen prn with activity and wean as tolerance improves.   -pain improving  -lidoderm patches 13. Constipaton: sorbitol today   LOS (Days) 2 A FACE TO FACE EVALUATION WAS PERFORMED  SWARTZ,ZACHARY T 01/30/2016 9:37 AM

## 2016-01-30 NOTE — Progress Notes (Signed)
Physical Therapy Session Note  Patient Details  Name: KEY BRULE MRN: [id number] Date of Birth: 12/06/1924  Today's Date: 01/30/2016 PT Individual Time: 1100-1140 and 1545-1620 PT Individual Time Calculation (min): 40 min and 35 min   Short Term Goals: Week 1:  PT Short Term Goal 1 (Week 1): Patient will perform sit <> stand with consistent min A and min cues for safe hand placement.  PT Short Term Goal 2 (Week 1): Patient will ambulate 75 ft using LRAD with steady assist.  PT Short Term Goal 3 (Week 1): Patient will negotiate two 6" stairs using R rail with steady assist.   Skilled Therapeutic Interventions/Progress Updates:   Treatment 1: Focus on sit <> stand transfers, functional ambulation, and activity tolerance. Patient sitting in wheelchair upon arrival. Instructed in multiple sit <> stand transfers from wheelchair using RW with supervision overall and max verbal/visual cues to reach back for armrests with lowering instead of keeping BUE on RW. Gait training using RW on carpet and tile surfaces x 75 ft and up/down ramp and across uneven mulch surfaces using RW with close supervision. Patient required verbal cues for keeping RW closer to body when negotiating over thresholds and surface changes. Patient demonstrates slow gait speed with shuffling/steppage gait, decreased stride length, and forward flexed posture. Patient SOB with all mobility requiring seated rest breaks. Patient fatigued and requested to return to bed. Patient ambulated from wheelchair to bed using RW with supervision and increased time. Patient requesting for RN to change dressing on elbow, RN notified and patient electing to stay sitting edge of bed until RN arrived, verbalized understanding to stay seated and call for assistance if needed.   Treatment 2: Patient sitting in wheelchair upon arrival. RN present with pain medication. Instructed in stair training up/down 4 (6") stairs using 2 rails with steady assist and  verbal cues for sequencing, sit <> stand and stand pivot transfers using RW with supervision-steady assist, and NuStep using BUE/BLE at level 4 x 5 min for strengthening and endurance. Patient ambulated in room using RW to/from bathroom and to bed with supervision. Performed toileting tasks with steady assist and use of grab bar and RW. Patient requested to return to bed at end of session, transferred sit > supine with supervision and left semi reclined in bed with needs in reach.   Therapy Documentation Precautions:  Precautions Precautions: Fall Restrictions Weight Bearing Restrictions: No General: PT Amount of Missed Time (min): 20 Minutes PT Missed Treatment Reason: Patient fatigue Pain: Unrated ribcage pain, "better than yesterday"  See Function Navigator for Current Functional Status.   Therapy/Group: Individual Therapy  Laretta Alstrom 01/30/2016, 11:50 AM

## 2016-01-30 NOTE — Progress Notes (Signed)
Occupational Therapy Session Note  Patient Details  Name: Raymond Swanson MRN: [id number] Date of Birth: [birth year]  Today's Date: 01/30/2016 OT Individual Time: 1000-1100 OT Individual Time Calculation (min): 60 min    Short Term Goals: Week 1:  OT Short Term Goal 1 (Week 1): Pt will be able to ambulate to toilet with close S using LRD. OT Short Term Goal 2 (Week 1): Pt will be able to don socks and shoes with min A. OT Short Term Goal 3 (Week 1): Pt will be able to reach into kitchen cupboards without LOB. OT Short Term Goal 4 (Week 1): Pt will demonstrate improved dynamic standing balance with steadying A during functional activities.  Skilled Therapeutic Interventions/Progress Updates:    Pt resting in bed upon arrival and requested use of toilet.  Pt sat EOB and amb with RW to bathroom to use toilet (steady A). Pt required tot A for toileting tasks.  Pt returned to room and completed bathing and dressing tasks with sit<>stand from w/c at sink.  Pt requires more than a reasonable amount of time with multiple rest breaks to complete all tasks.  Pt asked why he fatigued easily and educated on healing process and purpose/goal of rehab.  Discussed OT goals and pt stated he thought the goals were appropriate.    Therapy Documentation Precautions:  Precautions Precautions: Fall Restrictions Weight Bearing Restrictions: No  Pain: Pain Assessment Pain Assessment: 0-10 Pain Score: 3  Pain Type: Acute pain Pain Location: Head Pain Orientation: Posterior Pain Descriptors / Indicators: Headache Pain Frequency: Intermittent Pain Onset: Gradual Patients Stated Pain Goal: 0 Pain Intervention(s): Premedicated  ADL: ADL ADL Comments: steady A overall with BADLs  See Function Navigator for Current Functional Status.   Therapy/Group: Individual Therapy  Leroy Libman 01/30/2016, 11:09 AM

## 2016-01-31 ENCOUNTER — Inpatient Hospital Stay (HOSPITAL_COMMUNITY): Payer: Medicare Other | Admitting: Occupational Therapy

## 2016-01-31 ENCOUNTER — Inpatient Hospital Stay (HOSPITAL_COMMUNITY): Payer: Medicare Other

## 2016-01-31 ENCOUNTER — Inpatient Hospital Stay (HOSPITAL_COMMUNITY): Payer: Medicare Other | Admitting: Physical Therapy

## 2016-01-31 DIAGNOSIS — S2242XS Multiple fractures of ribs, left side, sequela: Secondary | ICD-10-CM

## 2016-01-31 NOTE — Progress Notes (Signed)
Occupational Therapy Session Note  Patient Details  Name: Raymond Swanson MRN: [id number] Date of Birth: 21-Mar-1925  Today's Date: 01/31/2016 OT Individual Time: [id number] OT Individual Time Calculation (min): 30 min   Short Term Goals: Week 1:  OT Short Term Goal 1 (Week 1): Pt will be able to ambulate to toilet with close S using LRD. OT Short Term Goal 2 (Week 1): Pt will be able to don socks and shoes with min A. OT Short Term Goal 3 (Week 1): Pt will be able to reach into kitchen cupboards without LOB. OT Short Term Goal 4 (Week 1): Pt will demonstrate improved dynamic standing balance with steadying A during functional activities.  Skilled Therapeutic Interventions/Progress Updates:  Patient found supine in bed asleep, pt easy to awake and arouse. Pt engaged in bed mobility and sat EOB. Lady brought patient his right hearing aide. Pt with no complaints of pain. Pt stood with RW and ambulated from room to therapy gym. Once in therapy gym, pt engaged in BUE exercises using 3lb weighted bar. X10 each exercise (shoulder flexion, shoulder protraction, shoulder horizontal abduction/adduction, elbow flexion/extension). Pt propelled self from gym to room in w/c and transferred to EOB. At end of session, left pt seated EOB with RN present in room.   Therapy Documentation Precautions:  Precautions Precautions: Fall Restrictions Weight Bearing Restrictions: No  Vital Signs: Therapy Vitals Temp: 98.2 F (36.8 C) Temp Source: Oral Pulse Rate: 88 Resp: 18 BP: (!) 147/76 mmHg Patient Position (if appropriate): Sitting Oxygen Therapy SpO2: 96 % O2 Device: Not Delivered  See Function Navigator for Current Functional Status.  Therapy/Group: Individual Therapy  Chrys Racer , MS, OTR/L, CLT  01/31/2016, 3:39 PM

## 2016-01-31 NOTE — IPOC Note (Signed)
Overall Plan of Care [facility]) Patient Details Name: Raymond Swanson MRN: [id number] DOB: October 22, 1924  Admitting Diagnosis: Trauma TBI  Hospital Problems: Principal Problem:   Traumatic subdural hematoma with loss of consciousness of 30 minutes or less (HCC) Active Problems:   Essential hypertension   Fracture of multiple ribs of left side   T8 vertebral fracture (HCC)   L1 vertebral fracture (HCC)   Traumatic intracranial subdural hematoma (HCC)     Functional Problem List: Nursing Bladder, Bowel, Endurance, Medication Management, Motor, Pain, Perception, Safety, Skin Integrity  PT Balance, Endurance, Motor, Nutrition, Pain  OT Balance, Endurance, Pain  SLP    TR         Basic ADL's: OT Bathing, Dressing, Toileting     Advanced  ADL's: OT Simple Meal Preparation, Light Housekeeping     Transfers: PT Bed Mobility, Bed to Chair, Car, Manufacturing systems engineer, Metallurgist: PT Stairs, Emergency planning/management officer, Ambulation     Additional Impairments: OT None  SLP        TR      Anticipated Outcomes Item Anticipated Outcome  Self Feeding I  Swallowing      Basic self-care  supervision  Toileting  supervision   Bathroom Transfers Supervision  Bowel/Bladder  Pt will manage bowel and bladder with min assist   Transfers  supervision  Locomotion  supervision  Communication     Cognition     Pain  Pt will rate pain at 4 or less on a scale of 0-10.   Safety/Judgment  Pt will remain free of skin breakdown and infection with min assist    Therapy Plan: PT Intensity: Minimum of 1-2 x/day ,45 to 90 minutes PT Frequency: 5 out of 7 days PT Duration Estimated Length of Stay: 10-12 days OT Intensity: Minimum of 1-2 x/day, 45 to 90 minutes OT Frequency: 5 out of 7 days OT Duration/Estimated Length of Stay: 10-12 days         Team Interventions: Nursing Interventions Patient/Family Education, Bladder Management, Bowel Management, Cognitive  Remediation/Compensation, Medication Management, Pain Management, Skin Care/Wound Management, Discharge Planning  PT interventions Ambulation/gait training, Balance/vestibular training, Community reintegration, Discharge planning, Disease management/prevention, DME/adaptive equipment instruction, Functional mobility training, Neuromuscular re-education, Pain management, Patient/family education, Psychosocial support, Stair training, Therapeutic Activities, Therapeutic Exercise, UE/LE Strength taining/ROM, UE/LE Coordination activities  OT Interventions Balance/vestibular training, Discharge planning, DME/adaptive equipment instruction, Functional mobility training, Therapeutic Activities, Therapeutic Exercise, Self Care/advanced ADL retraining, Patient/family education, Neuromuscular re-education  SLP Interventions    TR Interventions    SW/CM Interventions Discharge Planning, Psychosocial Support, Patient/Family Education    Team Discharge Planning: Destination: PT-Home ,OT- Home , SLP-  Projected Follow-up: PT-Home health PT, 24 hour supervision/assistance, OT-  Home health OT, SLP-None Projected Equipment Needs: PT-To be determined, OT- To be determined, SLP-None recommended by SLP Equipment Details: PT-family has some DME from patient's mother, OT-  Patient/family involved in discharge planning: PT- Patient,  OT-Patient, SLP-Patient  MD ELOS: 10-12 days Medical Rehab Prognosis:  Excellent Assessment: The patient has been admitted for CIR therapies with the diagnosis of polytrauma with TBI. The team will be addressing functional mobility, strength, stamina, balance, safety, adaptive techniques and equipment, self-care, bowel and bladder mgt, patient and caregiver education, pain mgt, ortho precautions, ego support, community reintegration. Goals have been set at supervision across disciplines. [name], MD, FAAPMR      See Team Conference Notes for weekly updates to the plan  of care

## 2016-01-31 NOTE — Progress Notes (Signed)
Physical Therapy Session Note  Patient Details  Name: Raymond Swanson MRN: [id number] Date of Birth: 1925/04/06  Today's Date: 01/31/2016 PT Individual Time: 1300-1400 PT Individual Time Calculation (min): 60 min   Short Term Goals: Week 1:  PT Short Term Goal 1 (Week 1): Patient will perform sit <> stand with consistent min A and min cues for safe hand placement.  PT Short Term Goal 2 (Week 1): Patient will ambulate 75 ft using LRAD with steady assist.  PT Short Term Goal 3 (Week 1): Patient will negotiate two 6" stairs using R rail with steady assist.   Skilled Therapeutic Interventions/Progress Updates:   Patient in wheelchair upon arrival, wife departing. Patient propelled wheelchair using BUE with extra time and supervision x 150 ft. Gait training using RW x 90 ft with supervision and verbal cues for increased stride length with minimal carryover. Performed 10 MWT = 0.22 m/s. Patient performed sit <> stand transfers using RW with verbal cues for safe hand placement on descent with supervision. To challenge standing tolerance and endurance, patient completed table top tasks including pipetree puzzles and game of Connect 4 in standing x 5.5 min+ 6.5 min before requiring seated rest break. Stair training up/down 4 (6") stairs using 2 rails with step-to pattern and supervision. In room, patient ambulated to/from bathroom using RW with supervision and performed toileting tasks with supervision. Patient left semi reclined in bed with needs in reach.     Therapy Documentation Precautions:  Precautions Precautions: Fall Restrictions Weight Bearing Restrictions: No Pain: Pain Assessment Pain Assessment: No/denies pain   See Function Navigator for Current Functional Status.   Therapy/Group: Individual Therapy  Laretta Alstrom 01/31/2016, 1:55 PM

## 2016-01-31 NOTE — Progress Notes (Signed)
Patient rested well throughout the night. Noted to be very tired and lethargic at beginning of shift. Patient continued to request to not be woken for night time meds. Patient states he is "only in pain during therapy and coughing." Patient encouraged to use a blanket or pillow for support when coughing. Verbalized understanding, needs reinforcement. Patient requesting to have am pain medication dose moved back to 0800, in order to better help with therapy. Pharmacy notified of change request. No acute distress noted.

## 2016-01-31 NOTE — Care Management Note (Signed)
Inpatient [facility] Individual Statement of Services  Patient Name:  Raymond Swanson  Date:  01/31/2016  Welcome to the [facility].  Our goal is to provide you with an individualized program based on your diagnosis and situation, designed to meet your specific needs.  With this comprehensive rehabilitation program, you will be expected to participate in at least 3 hours of rehabilitation therapies Monday-Friday, with modified therapy programming on the weekends.  Your rehabilitation program will include the following services:  Physical Therapy (PT), Occupational Therapy (OT), Speech Therapy (ST), 24 hour per day rehabilitation nursing, Therapeutic Recreaction (TR), Neuropsychology, Case Management (Social Worker), Rehabilitation Medicine, Nutrition Services and Pharmacy Services  Weekly team conferences will be held on Tuesdays to discuss your progress.  Your Social Worker will talk with you frequently to get your input and to update you on team discussions.  Team conferences with you and your family in attendance may also be held.  Expected length of stay: 10-12 days  Overall anticipated outcome: supervision  Depending on your progress and recovery, your program may change. Your Social Worker will coordinate services and will keep you informed of any changes. Your Social Worker's name and contact numbers are listed  below.  The following services may also be recommended but are not provided by the [facility] will be made to provide these services after discharge if needed.  Arrangements include referral to agencies that provide these services.  Your insurance has been verified to be:  [employer] Your primary doctor is:  Dr. Burnice Logan  Pertinent information will be shared with your doctor and your insurance  company.  Social Worker:  [place], [place] or ([phone]   Information discussed with and copy given to patient by: Lennart Pall, 01/31/2016, 3:58 PM

## 2016-01-31 NOTE — Progress Notes (Signed)
Social Work  Social Work Assessment and Plan  Patient Details  Name: Raymond Swanson MRN: [id number] Date of Birth: Dec 14, 1924  Today's Date: 01/31/2016  Problem List:  Patient Active Problem List   Diagnosis Date Noted  . Traumatic intracranial subdural hematoma (HCC) 01/28/2016  . Fall 01/27/2016  . Fracture of multiple ribs of left side 01/27/2016  . T8 vertebral fracture ([place]) 01/27/2016  . L1 vertebral fracture ([place]) 01/27/2016  . Lumbar transverse process fracture ([place]) 01/27/2016  . Pressure ulcer 01/26/2016  . Traumatic subdural hematoma with loss of consciousness of 30 minutes or less ([place]) 01/22/2016  . ANEMIA 10/08/2009  . ACUT GASTR ULCER W/HEMORR W/O MENTION OBST 09/10/2009  . MELENA 08/28/2009  . Hypothyroidism 10/18/2007  . Dyslipidemia 01/26/2007  . Essential hypertension 01/26/2007  . Coronary atherosclerosis 01/26/2007  . PROSTATE CANCER, HX OF 01/26/2007  . SKIN CANCER, HX OF 01/26/2007   Past Medical History:  Past Medical History  Diagnosis Date  . Cancer ([place])   . ACUT GASTR ULCER W/HEMORR W/O MENTION OBST 09/10/2009  . ANEMIA 10/08/2009  . CORONARY ARTERY DISEASE 01/26/2007  . HYPERLIPIDEMIA 01/26/2007  . HYPERTENSION 01/26/2007  . HYPOTHYROIDISM 10/18/2007  . MELENA 08/28/2009  . PROSTATE CANCER, HX OF 01/26/2007  . SKIN CANCER, HX OF 01/26/2007   Past Surgical History:  Past Surgical History  Procedure Laterality Date  . Coronary artery bypass graft      1991  . Hernia repair      ingunial  . Prostate surgery      prostatectomy  . Mohs surgery    . Cataract extraction     Social History:  reports that he quit smoking about 67 years ago. His smoking use included Cigarettes. He has a 20 pack-year smoking history. He has never used smokeless tobacco. He reports that he drinks alcohol. He reports that he does not use illicit drugs.  Family / Support Systems Marital Status: Married Patient Roles: Spouse, Parent Spouse/Significant Other: wife, Khaalid Truxillo  @ [phone] Children: pt and wife have one son, Richardson Landry, living in [place] Anticipated Caregiver: wife Ability/Limitations of Caregiver: no limitations Caregiver Availability: 24/7  Social History Preferred language: English Religion: Non-Denominational Cultural Background: NA Read: Yes Write: Yes Employment Status: Retired Freight forwarder Issues: None Guardian/Conservator: None - per MD, pt is capable of making decisions on his own behalf   Abuse/Neglect Physical Abuse: Denies Verbal Abuse: Denies Sexual Abuse: Denies Exploitation of patient/patient's resources: Denies Self-Neglect: Denies  Emotional Status Pt's affect, behavior adn adjustment status: Pt extremely HOH making interveiw assessment difficult.  He is able to answer basic, personal questions.  He denies any emotional distress - will monitor and refer for neuropsych consult as indicated. Recent Psychosocial Issues: None Pyschiatric History: None Substance Abuse History: None  Patient / Family Perceptions, Expectations & Goals Pt/Family understanding of illness & functional limitations: Pt able to report that he hit his head in a fall.  Wife with general understanding of his TBI and current functional limitations/ need for CIR Premorbid pt/family roles/activities: Pt was independent overall PTA.  Very active and enjoys gardening and bowling. Anticipated changes in roles/activities/participation: Pt expected to need supervision and wife able to provide this. Pt/family expectations/goals: "I just hope I'll be ready to go home soon."  [employer]: None Premorbid Home Care/DME Agencies: None Transportation available at discharge: yes  Discharge Planning Living Arrangements: Spouse/significant other Support Systems: Spouse/significant other Type of Residence: Private residence Insurance Resources: [employer] Financial Resources:  Social Security Financial Screen  Referred: No Living Expenses: Own Money Management: Spouse Does the patient have any problems obtaining your medications?: No Home Management: pt and spouse Patient/Family Preliminary Plans: Pt to return home with wife as primary support Social Work Anticipated Follow Up Needs: HH/OP Expected length of stay: 10-12 days  Clinical Impression Frail appearing, elderly gentleman here following a fall with TBI. Very HOH and this makes assessment interview very difficult but able to gather needed info per pt and wife(via phone).  Wife is only support person and she reports she is able to provide supervision.  Pt denies any emotional distress.  Goals set for supervision.  Will follow for support and d/c planning needs.  Tanuj Mullens 01/31/2016, 3:24 PM

## 2016-01-31 NOTE — Progress Notes (Signed)
[facility] PHYSICAL MEDICINE & REHABILITATION     PROGRESS NOTE    Subjective/Complaints:   A little fatigued this morning.  Pain still an issue with movement    ROS: Pt denies fever, rash/itching, headache, blurred or double vision, nausea, vomiting, abdominal pain, diarrhea,   palpitations, dysuria, dizziness, neck or back pain, bleeding, anxiety, or depression   Objective: Vital Signs: Blood pressure 147/76, pulse 88, temperature 98.2 F (36.8 C), temperature source Oral, resp. rate 18, height 5\' 8"  (1.727 m), weight 64.365 kg (141 lb 14.4 oz), SpO2 96 %. No results found.  Recent Labs  01/28/16 1800  WBC 9.2  HGB 11.1*  HCT 34.9*  PLT 196    Recent Labs  01/28/16 1800  NA 138  K 3.9  CL 102  GLUCOSE 134*  BUN 22*  CREATININE 1.05  CALCIUM 8.9   CBG (last 3)  No results for input(s): GLUCAP in the last 72 hours.  Wt Readings from Last 3 Encounters:  01/31/16 64.365 kg (141 lb 14.4 oz)  01/22/16 62.4 kg (137 lb 9.1 oz)  12/31/15 63.504 kg (140 lb)    Physical Exam:  Constitutional: He is oriented to person, place, and time. He appears well-developed and well-nourished.  Thin elderly male  HENT:  Head: Normocephalic and atraumatic.  Right Ear: External ear normal.  Dried blood on occiput with bruising noted---dry blood on pillow  Eyes: Conjunctivae are normal. Pupils are equal, round, and reactive to light.  Neck: Normal range of motion. Neck supple. No tracheal deviation present. No thyromegaly present.  Cardiovascular: Normal rate and regular rhythm. Exam reveals no friction rub.  No murmur heard. Respiratory: Effort normal. He has no wheezes. He has no rales. He exhibits tenderness (left lateral chest).    GI: Soft. Bowel sounds are normal. He exhibits no distension. There is no tenderness.  Musculoskeletal: He exhibits no edema or tenderness.  Neurological: He is alert and oriented to person, place, and time.  HOH. Speech clear.  hearing  deficits. Seems to have reasonable insight and awareness.  RUE 4/5 prox to distal. LUE 3-4/5 and limited due to pain in chest. LE's 4/5 HF,KE and 4+ ADF/PF. No gross sensory abnl.  Skin: Skin is warm and dry.  Psychiatric: He has a normal mood and affect. His speech is normal and behavior is normal.   Assessment/Plan: 1. Functional and cognitive deficits secondary to TBI with polytrauma which require 3+ hours per day of interdisciplinary therapy in a comprehensive inpatient rehab setting. Physiatrist is providing close team supervision and 24 hour management of active medical problems listed below. Physiatrist and rehab team continue to assess barriers to discharge/monitor patient progress toward functional and medical goals.  Function:  Bathing Bathing position   Position: Wheelchair/chair at sink  Bathing parts Body parts bathed by patient: Right arm, Left arm, Chest, Abdomen, Front perineal area, Buttocks, Right upper leg, Left upper leg Body parts bathed by helper: Right lower leg, Back  Bathing assist Assist Level: Touching or steadying assistance(Pt > 75%)      Upper Body Dressing/Undressing Upper body dressing   What is the patient wearing?: Button up shirt         Button up shirt - Perfomed by patient: Thread/unthread right sleeve, Thread/unthread left sleeve, Pull shirt around back, Button/unbutton shirt      Upper body assist Assist Level: Set up, Supervision or verbal cues      Lower Body Dressing/Undressing Lower body dressing   What is the patient  wearing?: Underwear, Pants, Socks, Shoes Underwear - Performed by patient: Thread/unthread right underwear leg, Pull underwear up/down Underwear - Performed by helper: Thread/unthread left underwear leg Pants- Performed by patient: Thread/unthread right pants leg, Pull pants up/down, Fasten/unfasten pants Pants- Performed by helper: Thread/unthread left pants leg       Socks - Performed by helper: Don/doff right  sock, Don/doff left sock   Shoes - Performed by helper: Don/doff right shoe, Don/doff left shoe, Fasten right, Fasten left          Lower body assist Assist for lower body dressing: Touching or steadying assistance (Pt > 75%)      Toileting Toileting   Toileting steps completed by patient: Adjust clothing prior to toileting, Performs perineal hygiene, Adjust clothing after toileting Toileting steps completed by helper: Adjust clothing prior to toileting, Performs perineal hygiene, Adjust clothing after toileting Toileting Assistive Devices: Grab bar or rail  Toileting assist Assist level: Touching or steadying assistance (Pt.75%)   Transfers Chair/bed transfer   Chair/bed transfer method: Ambulatory Chair/bed transfer assist level: Moderate assist (Pt 50 - 74%/lift or lower) Chair/bed transfer assistive device: Armrests, Medical sales representative     Max distance: 75 Assist level: Supervision or verbal cues   Wheelchair   Type: Manual Max wheelchair distance: 100 Assist Level: Dependent (Pt equals 0%)  Cognition Comprehension Comprehension assist level: Follows complex conversation/direction with extra time/assistive device  Expression Expression assist level: Expresses complex ideas: With extra time/assistive device  Social Interaction Social Interaction assist level: Interacts appropriately with others with medication or extra time (anti-anxiety, antidepressant).  Problem Solving Problem solving assist level: Solves complex problems: With extra time  Memory Memory assist level: More than reasonable amount of time   Medical Problem List and Plan: 1. Functional, cognitive, mobility deficits secondary to TBI/polytrauma (including L1, T8 fx's) after fall -continue CIR therapies. 2. DVT Prophylaxis/Anticoagulation: Mechanical: Sequential compression devices, below knee Bilateral lower extremities 3. Pain Management: Continue ultram and tylenol qid--   Low dose scheduled oxycodone prior to therapy sessions--observe for oversedation 4. Mood: LCSW to follow for evaluation and support.  5. Neuropsych: This patient is capable of making decisions on his own behalf. 6. Skin/Wound Care: Monitor abrasion for healing.  7. Fluids/Electrolytes/Nutrition: Monitor I/O. Encourage PO  -follow up labs   8. CAD: On Zocor. No ASA due to bleed.  9. Hypotension: encourage fluid intake. Monitor bid.  10. Anemia: labs pending  11. Leucocytosis: Likely reactive. Monitor for signs of infection or fevers.  12. Left rib fractures with Hypoxia: Encourage IS--oxygen prn with activity and wean as tolerance improves.   -pain improving  -lidoderm patches 13. Constipaton: sorbitol today   LOS (Days) 3 A FACE TO FACE EVALUATION WAS PERFORMED  Brek Reece T 01/31/2016 10:32 AM

## 2016-01-31 NOTE — Progress Notes (Signed)
Occupational Therapy Session Note  Patient Details  Name: Raymond Swanson MRN: [id number] Date of Birth: 01-11-1925  Today's Date: 01/31/2016 OT Individual Time: 1100-1200 OT Individual Time Calculation (min): 60 min    Short Term Goals: Week 1:  OT Short Term Goal 1 (Week 1): Pt will be able to ambulate to toilet with close S using LRD. OT Short Term Goal 2 (Week 1): Pt will be able to don socks and shoes with min A. OT Short Term Goal 3 (Week 1): Pt will be able to reach into kitchen cupboards without LOB. OT Short Term Goal 4 (Week 1): Pt will demonstrate improved dynamic standing balance with steadying A during functional activities.  Skilled Therapeutic Interventions/Progress Updates:    Pt asleep in bed upon arrival and required min multimodal cues to arouse.  Pt declined bathing and dressing requesting to complete after he has completed his exercises for the day.  Pt initially engaged in functional amb with RW for simple home mgmt tasks with focus on activity tolerance, balance, and safety awareness. Pt transitioned to BUE therex on [place] with focus on increased activity tolerance/endurance.  Pt required multiple rest breaks throughout session.  Pt returned to room and remained in w/c with all needs within reach.   Therapy Documentation Precautions:  Precautions Precautions: Fall Restrictions Weight Bearing Restrictions: No  Pain: Pain Assessment Pain Assessment: 0-10 Pain Score: Asleep Pain Type: Acute pain Pain Location: Rib cage Pain Orientation: Left Pain Descriptors / Indicators: Aching Pain Frequency: Intermittent Pain Onset: Gradual Patients Stated Pain Goal: 0 Pain Intervention(s): Medication (See eMAR) ADL: ADL ADL Comments: steady A overall with BADLs  See Function Navigator for Current Functional Status.   Therapy/Group: Individual Therapy  Leroy Libman 01/31/2016, 12:01 PM

## 2016-02-01 ENCOUNTER — Inpatient Hospital Stay (HOSPITAL_COMMUNITY): Payer: Medicare Other | Admitting: Occupational Therapy

## 2016-02-01 ENCOUNTER — Inpatient Hospital Stay (HOSPITAL_COMMUNITY): Payer: Medicare Other | Admitting: Physical Therapy

## 2016-02-01 DIAGNOSIS — S32018S Other fracture of first lumbar vertebra, sequela: Secondary | ICD-10-CM

## 2016-02-01 MED ORDER — TRAZODONE HCL 50 MG PO TABS
25.0000 mg | ORAL_TABLET | Freq: Every day | ORAL | Status: DC
  Administered 2016-02-01 – 2016-02-06 (×6): 25 mg via ORAL
  Filled 2016-02-01 (×6): qty 1

## 2016-02-01 NOTE — Progress Notes (Signed)
Occupational Therapy Session Note  Patient Details  Name: Raymond Swanson MRN: [id number] Date of Birth: [birth year]  Today's Date: 02/01/2016 OT Individual Time: [id number] OT Individual Time Calculation (min): 60 min    Short Term Goals: Week 1:  OT Short Term Goal 1 (Week 1): Pt will be able to ambulate to toilet with close S using LRD. OT Short Term Goal 2 (Week 1): Pt will be able to don socks and shoes with min A. OT Short Term Goal 3 (Week 1): Pt will be able to reach into kitchen cupboards without LOB. OT Short Term Goal 4 (Week 1): Pt will demonstrate improved dynamic standing balance with steadying A during functional activities.  Skilled Therapeutic Interventions/Progress Updates:    1:1 self care retraining at sink level (pt declined shower today). Pt ambulated to the bathroom with RW with close supervision with RW with more than reasonable amt of time to cross threshold and to perform stand to sit (sitting due to back discomfort). Pt recalled events leading to fall and shared. Pt returned to sink with close supervision. Pt participated in bathing and dressing sit to stand but needed prompting to participate in this activity- pt not recalling he had been wearing these clothes since yesterday morning and had not washed. Pt demonstrating distant supervision for static and some dynamic standing balance activities today when allowed extra time to perform tasks.  Assisted pt with cleaning hair and cleaning some of dried blood out of hair at the sink. Pt return to bed at conclusion of session due to fatigue and request to rest.  Therapy Documentation Precautions:  Precautions Precautions: Fall Restrictions Weight Bearing Restrictions: No Pain: Pain Assessment Pain Assessment: No/denies pain Pain Score: 0-No pain  See Function Navigator for Current Functional Status.   Therapy/Group: Individual Therapy  [name] [facility] 02/01/2016, 9:59 AM

## 2016-02-01 NOTE — Progress Notes (Signed)
Occupational Therapy Note  Patient Details  Name: Raymond Swanson MRN: [id number] Date of Birth: Mar 14, 1925  Today's Date: 02/01/2016 OT Missed Time: 18 Minutes Missed Time Reason: Patient fatigue  Pt reported fatigue and desired to stay in bed to rest and decline out of bed activity this pm despite review of goals and recommendations for progress.    [name] [facility] 02/01/2016, 2:47 PM

## 2016-02-01 NOTE — Progress Notes (Signed)
Raymond Swanson PHYSICAL MEDICINE & REHABILITATION     PROGRESS NOTE    Subjective/Complaints:   States that his pain is better but he's depressed about being in the hospital. Had problems with sleep last night.   ROS: Pt denies fever, rash/itching, headache, blurred or double vision, nausea, vomiting, abdominal pain, diarrhea,   palpitations, dysuria, dizziness, neck or back pain, bleeding, anxiety, or depression   Objective: Vital Signs: Blood pressure 148/63, pulse 69, temperature 98.2 F (36.8 C), temperature source Oral, resp. rate 18, height 5\' 8"  (1.727 m), weight 64.365 kg (141 lb 14.4 oz), SpO2 95 %. No results found. No results for input(s): WBC, HGB, HCT, PLT in the last 72 hours. No results for input(s): NA, K, CL, GLUCOSE, BUN, CREATININE, CALCIUM in the last 72 hours.  Invalid input(s): CO CBG (last 3)  No results for input(s): GLUCAP in the last 72 hours.  Wt Readings from Last 3 Encounters:  01/31/16 64.365 kg (141 lb 14.4 oz)  01/22/16 62.4 kg (137 lb 9.1 oz)  12/31/15 63.504 kg (140 lb)    Physical Exam:  Constitutional: He is oriented to person, place, and time. He appears well-developed and well-nourished.  Thin elderly male  HENT:  Head: Normocephalic and atraumatic.  Right Ear: External ear normal.  Dried blood on occiput    Eyes: Conjunctivae are normal. Pupils are equal, round, and reactive to light.  Neck: Normal range of motion. Neck supple. No tracheal deviation present. No thyromegaly present.  Cardiovascular: Normal rate and regular rhythm. Exam reveals no friction rub.  No murmur heard. Respiratory: Effort normal. He has no wheezes. He has no rales. He exhibits tenderness (left lateral chest).    GI: Soft. Bowel sounds are normal. He exhibits no distension. There is no tenderness.  Musculoskeletal: He exhibits no edema or tenderness.  Neurological: He is alert and oriented to person, place, and time.  HOH. Speech clear.  hearing deficits.  Seems to have reasonable insight and awareness.  RUE 4/5 prox to distal. LUE 3-4/5 and limited due to pain in chest. LE's 4/5 HF,KE and 4+ ADF/PF. No gross sensory abnl.  Skin: Skin is warm and dry.  Psychiatric: He has a normal mood and affect. His speech is normal and behavior is normal.   Assessment/Plan: 1. Functional and cognitive deficits secondary to TBI with polytrauma which require 3+ hours per day of interdisciplinary therapy in a comprehensive inpatient rehab setting. Physiatrist is providing close team supervision and 24 hour management of active medical problems listed below. Physiatrist and rehab team continue to assess barriers to discharge/monitor patient progress toward functional and medical goals.  Function:  Bathing Bathing position   Position: Wheelchair/chair at sink  Bathing parts Body parts bathed by patient: Right arm, Left arm, Chest, Abdomen, Front perineal area, Buttocks, Right upper leg, Left upper leg Body parts bathed by helper: Right lower leg, Back  Bathing assist Assist Level: Touching or steadying assistance(Pt > 75%)      Upper Body Dressing/Undressing Upper body dressing   What is the patient wearing?: Button up shirt         Button up shirt - Perfomed by patient: Thread/unthread right sleeve, Thread/unthread left sleeve, Pull shirt around back, Button/unbutton shirt      Upper body assist Assist Level: Set up, Supervision or verbal cues      Lower Body Dressing/Undressing Lower body dressing   What is the patient wearing?: Underwear, Pants, Socks, Shoes Underwear - Performed by patient: Thread/unthread right underwear  leg, Pull underwear up/down Underwear - Performed by helper: Thread/unthread left underwear leg Pants- Performed by patient: Thread/unthread right pants leg, Pull pants up/down, Fasten/unfasten pants Pants- Performed by helper: Thread/unthread left pants leg       Socks - Performed by helper: Don/doff right sock,  Don/doff left sock   Shoes - Performed by helper: Don/doff right shoe, Don/doff left shoe, Fasten right, Fasten left          Lower body assist Assist for lower body dressing: Touching or steadying assistance (Pt > 75%)      Toileting Toileting   Toileting steps completed by patient: Adjust clothing prior to toileting, Performs perineal hygiene (2/2 steps) Toileting steps completed by helper: Adjust clothing after toileting Toileting Assistive Devices: Grab bar or rail  Toileting assist Assist level: Supervision or verbal cues   Transfers Chair/bed transfer   Chair/bed transfer method: Ambulatory Chair/bed transfer assist level: Supervision or verbal cues Chair/bed transfer assistive device: Armrests, Medical sales representative     Max distance: 150 Assist level: Supervision or verbal cues   Wheelchair   Type: Manual Max wheelchair distance: 150 Assist Level: Supervision or verbal cues  Cognition Comprehension Comprehension assist level: Follows complex conversation/direction with extra time/assistive device  Expression Expression assist level: Expresses complex ideas: With extra time/assistive device  Social Interaction Social Interaction assist level: Interacts appropriately with others with medication or extra time (anti-anxiety, antidepressant).  Problem Solving Problem solving assist level: Solves complex problems: With extra time  Memory Memory assist level: More than reasonable amount of time, Assistive device: No helper   Medical Problem List and Plan: 1. Functional, cognitive, mobility deficits secondary to TBI/polytrauma (including L1, T8 fx's) after fall -continue CIR therapies. 2. DVT Prophylaxis/Anticoagulation: Mechanical: Sequential compression devices, below knee Bilateral lower extremities 3. Pain Management: Continue ultram and tylenol qid--  Low dose scheduled oxycodone prior to therapy sessions--observe for oversedation 4. Mood:  provided encouragement. ?neuropsych  -will schedule low dose trazodone to help with sleep  5. Neuropsych: This patient is capable of making decisions on his own behalf. 6. Skin/Wound Care: Monitor abrasion for healing.  7. Fluids/Electrolytes/Nutrition: Monitor I/O. Encourage PO  -follow up labs Monday 8. CAD: On Zocor. No ASA due to bleed.  9. Hypotension: encourage fluid intake. Monitor bid.  10. Anemia: labs pending  11. Leucocytosis: Likely reactive. Monitor for signs of infection or fevers.  12. Left rib fractures with Hypoxia: Encourage IS--oxygen prn with activity and wean as tolerance improves.   -pain improving  -lidoderm patches 13. Constipaton: sorbitol today   LOS (Days) 4 A FACE TO FACE EVALUATION WAS PERFORMED  Latisha Lasch T 02/01/2016 10:00 AM

## 2016-02-01 NOTE — Progress Notes (Signed)
Physical Therapy Session Note  Patient Details  Name: Raymond Swanson MRN: [id number] Date of Birth: [birth year]  Today's Date: 02/01/2016 PT Individual Time: 0800-0900 PT Individual Time Calculation (min): 60 min   Short Term Goals: Week 1:  PT Short Term Goal 1 (Week 1): Patient will perform sit <> stand with consistent min A and min cues for safe hand placement.  PT Short Term Goal 2 (Week 1): Patient will ambulate 75 ft using LRAD with steady assist.  PT Short Term Goal 3 (Week 1): Patient will negotiate two 6" stairs using R rail with steady assist.   Skilled Therapeutic Interventions/Progress Updates:   Patient in bed finishing breakfast, reporting poor sleep due to restlessness and worrying. Patient states he would like to know more of what the plan is for him and states, "I know why I'm here, but I want to know why you think I'm here." Discussed patient goals and POC as well as patient's current impairments, mostly limited due to decreased activity tolerance. Patient verbalized understanding and in agreement. Engaged in discussion regarding DC planning and f/u HHPT and patient reports that he currently feels like he would be too much of a burden to his wife at this time. Discussed goals to decrease burden of care and improve patient independence prior to DC. Patient performed multiple sit <> stand transfers from edge of bed using RW with supervision to use urinal and perform LB dressing and stand pivot transfers throughout session with supervision. Gait training using RW x 150 ft + 50 ft with supervision, patient demonstrating improved B step length and improved gait speed. Instructed in OTAGO HEP for fall prevention and strengthening using RW for UE support in standing: LAQ 2 x 10 each LE, marching x 20, heel raises x 20, knee flexion x 10 each LE, squats x 20, hip abduction x 10 each LE. Stair training up/down 4 (6") stairs using R rail ascending to simulate home entry with supervision except  steady assist at top of stairs when railing ended. Patient left sitting in wheelchair with all needs in reach.    Therapy Documentation Precautions:  Precautions Precautions: Fall Restrictions Weight Bearing Restrictions: No Pain: Pain Assessment Pain Assessment: No/denies pain Pain Score: 0-No pain  See Function Navigator for Current Functional Status.   Therapy/Group: Individual Therapy  Laretta Alstrom 02/01/2016, 8:52 AM

## 2016-02-02 ENCOUNTER — Inpatient Hospital Stay (HOSPITAL_COMMUNITY): Payer: Medicare Other | Admitting: *Deleted

## 2016-02-02 ENCOUNTER — Inpatient Hospital Stay (HOSPITAL_COMMUNITY): Payer: Medicare Other | Admitting: Occupational Therapy

## 2016-02-02 MED ORDER — MEGESTROL ACETATE 400 MG/10ML PO SUSP
400.0000 mg | Freq: Two times a day (BID) | ORAL | Status: DC
  Administered 2016-02-02 – 2016-02-07 (×11): 400 mg via ORAL
  Filled 2016-02-02 (×11): qty 10

## 2016-02-02 NOTE — Progress Notes (Signed)
[place] PHYSICAL MEDICINE & REHABILITATION     PROGRESS NOTE    Subjective/Complaints:  states that he didn't sleep a wink. RN reports he slept most of the night. Doesn't have appetite  ROS: Pt denies fever, rash/itching, headache, blurred or double vision, nausea, vomiting, abdominal pain, diarrhea,   palpitations, dysuria, dizziness, neck or back pain, bleeding, anxiety, or depression   Objective: Vital Signs: Blood pressure 112/76, pulse 114, temperature 97.9 F (36.6 C), temperature source Oral, resp. rate 20, height 5\' 8"  (1.727 m), weight 64.365 kg (141 lb 14.4 oz), SpO2 94 %. No results found. No results for input(s): WBC, HGB, HCT, PLT in the last 72 hours. No results for input(s): NA, K, CL, GLUCOSE, BUN, CREATININE, CALCIUM in the last 72 hours.  Invalid input(s): CO CBG (last 3)  No results for input(s): GLUCAP in the last 72 hours.  Wt Readings from Last 3 Encounters:  01/31/16 64.365 kg (141 lb 14.4 oz)  01/22/16 62.4 kg (137 lb 9.1 oz)  12/31/15 63.504 kg (140 lb)    Physical Exam:  Constitutional: He is oriented to person, place, and time. He appears well-developed and well-nourished.  Thin elderly male  HENT:  Head: Normocephalic and atraumatic.  Right Ear: External ear normal.       Eyes: Conjunctivae are normal. Pupils are equal, round, and reactive to light.  Neck: Normal range of motion. Neck supple. No tracheal deviation present. No thyromegaly present.  Cardiovascular: Normal rate and regular rhythm. Exam reveals no friction rub.  No murmur heard. Respiratory: Effort normal. He has no wheezes. He has no rales. He exhibits tenderness (left lateral chest).    GI: Soft. Bowel sounds are normal. He exhibits no distension. There is no tenderness.  Musculoskeletal: He exhibits no edema or tenderness.  Neurological: He is alert and oriented to person, place, and time.  HOH. Speech clear.  hearing deficits. Seems to have reasonable insight and  awareness.  RUE 4/5 prox to distal. LUE 3-4/5 and limited due to pain in chest. LE's 4/5 HF,KE and 4+ ADF/PF. No gross sensory abnl.  Skin: Skin is warm and dry.  Psychiatric: He has a normal mood and affect. His speech is normal and behavior is normal.   Assessment/Plan: 1. Functional and cognitive deficits secondary to TBI with polytrauma which require 3+ hours per day of interdisciplinary therapy in a comprehensive inpatient rehab setting. Physiatrist is providing close team supervision and 24 hour management of active medical problems listed below. Physiatrist and rehab team continue to assess barriers to discharge/monitor patient progress toward functional and medical goals.  Function:  Bathing Bathing position   Position: Wheelchair/chair at sink  Bathing parts Body parts bathed by patient: Right arm, Left arm, Chest, Abdomen, Front perineal area, Buttocks, Right upper leg, Left upper leg Body parts bathed by helper: Right lower leg, Back  Bathing assist Assist Level: Touching or steadying assistance(Pt > 75%)      Upper Body Dressing/Undressing Upper body dressing   What is the patient wearing?: Button up shirt         Button up shirt - Perfomed by patient: Thread/unthread right sleeve, Thread/unthread left sleeve, Pull shirt around back, Button/unbutton shirt      Upper body assist Assist Level: Set up, Supervision or verbal cues      Lower Body Dressing/Undressing Lower body dressing   What is the patient wearing?: Underwear, Pants, Socks, Shoes Underwear - Performed by patient: Thread/unthread right underwear leg, Pull underwear up/down Underwear -  Performed by helper: Thread/unthread left underwear leg Pants- Performed by patient: Thread/unthread right pants leg, Pull pants up/down, Fasten/unfasten pants Pants- Performed by helper: Thread/unthread left pants leg       Socks - Performed by helper: Don/doff right sock, Don/doff left sock   Shoes - Performed  by helper: Don/doff right shoe, Don/doff left shoe, Fasten right, Fasten left          Lower body assist Assist for lower body dressing: Touching or steadying assistance (Pt > 75%)      Toileting Toileting   Toileting steps completed by patient: Adjust clothing prior to toileting, Performs perineal hygiene (2/2 steps) Toileting steps completed by helper: Adjust clothing after toileting Toileting Assistive Devices: Grab bar or rail  Toileting assist Assist level: Supervision or verbal cues   Transfers Chair/bed transfer   Chair/bed transfer method: Stand pivot Chair/bed transfer assist level: Moderate assist (Pt 50 - 74%/lift or lower) Chair/bed transfer assistive device: Armrests, Medical sales representative     Max distance: 130 Assist level: Touching or steadying assistance (Pt > 75%)   Wheelchair   Type: Manual Max wheelchair distance: 150 Assist Level: Supervision or verbal cues  Cognition Comprehension Comprehension assist level: Follows basic conversation/direction with extra time/assistive device  Expression Expression assist level: Expresses basic needs/ideas: With extra time/assistive device  Social Interaction Social Interaction assist level: Interacts appropriately with others with medication or extra time (anti-anxiety, antidepressant).  Problem Solving Problem solving assist level: Solves complex problems: With extra time  Memory Memory assist level: More than reasonable amount of time   Medical Problem List and Plan: 1. Functional, cognitive, mobility deficits secondary to TBI/polytrauma (including L1, T8 fx's) after fall -continue CIR therapies. 2. DVT Prophylaxis/Anticoagulation: Mechanical: Sequential compression devices, below knee Bilateral lower extremities 3. Pain Management: Continue ultram and tylenol qid--  Low dose scheduled oxycodone prior to therapy sessions--observe for oversedation 4. Mood: provided encouragement.  ?neuropsych  - scheduled low dose trazodone to help with sleep  5. Neuropsych: This patient is capable of making decisions on his own behalf. 6. Skin/Wound Care: Monitor abrasion for healing.  7. Fluids/Electrolytes/Nutrition: Monitor I/O. Encourage PO  -follow up labs Monday  -will add megace for appetite. Pt agrees to try 8. CAD: On Zocor. No ASA due to bleed.  9. Hypotension: encourage fluid intake. Monitor bid.  10. Anemia: labs pending  11. Leucocytosis: Likely reactive. Monitor for signs of infection or fevers.  12. Left rib fractures with Hypoxia: Encourage IS--oxygen prn with activity and wean as tolerance improves.   -pain improving  -lidoderm patches to rib cage 13. Constipaton: sorbitol today   LOS (Days) 5 A FACE TO FACE EVALUATION WAS PERFORMED  Shivangi Lutz T 02/02/2016 9:34 AM

## 2016-02-02 NOTE — Progress Notes (Signed)
Occupational Therapy Session Note  Patient Details  Name: Raymond Swanson MRN: [id number] Date of Birth: Jan 31, 1925  Today's Date: 02/02/2016 OT Individual Time: 1000-1100 and 1345-1415 OT Individual Time Calculation (min): 60 min and 30 min   Short Term Goals: Week 1:  OT Short Term Goal 1 (Week 1): Pt will be able to ambulate to toilet with close S using LRD. OT Short Term Goal 2 (Week 1): Pt will be able to don socks and shoes with min A. OT Short Term Goal 3 (Week 1): Pt will be able to reach into kitchen cupboards without LOB. OT Short Term Goal 4 (Week 1): Pt will demonstrate improved dynamic standing balance with steadying A during functional activities.  Skilled Therapeutic Interventions/Progress Updates:    Visit 1:  No c/o pain. Pt seen this session to facilitate dynamic balance and safe mobility skills. Pt declined B/D stating he had a good sponge bath last night with clean clothes on this am. He would prefer to wait for his wife to be here when he tries his first shower. Pt agreeable to therapy. Pt ambulated to gym with RW with no cuing needed to find the gym. Touching A to close S with ambulation using UE support on RW. Pt transferred to seat to use UBE (arm bike) at resistance 3 for 10 min. No c/o pain with exercises. Pt then sat to EOB to work on sit to stand without UE support holding ball. Pt with strong posterior lean without support.  Pt then tried same activity to put hands on shopping cart handle. In standing pt practiced pushing cart forward and back with no lean for several minutes.  Cart removed and pt then able to maintain static stand with close S and no posterior lean. Brief rest on mat and then ambulated back to room to toilet. Stood at sink to wash hands with touching A. Pt in chair in room with all needs met.  Visit 2: no c/o pain Pt self propelling w/c around hallway "checking things out". When informed it was time for therapy, pt stated "oh that's ok, I am done for  the day".  With some negotiation pt agreed to exercise in front of the tv. Work on sit to stand to [employer] with marching in place and alternating toe taps. He also complete 2# dowel bar shoulder and chest presses. Pt requested to go back to bed. Pt used RW to walk around bed with steadying A and then got into bed without A. Pt in bed with all needs met.   Therapy Documentation Precautions:  Precautions Precautions: Fall Restrictions Weight Bearing Restrictions: No      ADL: ADL ADL Comments: steady A overall with BADLs  See Function Navigator for Current Functional Status.   Therapy/Group: Individual Therapy  [place] 02/02/2016, 12:25 PM

## 2016-02-03 ENCOUNTER — Inpatient Hospital Stay (HOSPITAL_COMMUNITY): Payer: Medicare Other | Admitting: Physical Therapy

## 2016-02-03 ENCOUNTER — Inpatient Hospital Stay (HOSPITAL_COMMUNITY): Payer: Medicare Other | Admitting: Occupational Therapy

## 2016-02-03 ENCOUNTER — Inpatient Hospital Stay (HOSPITAL_COMMUNITY): Payer: Medicare Other

## 2016-02-03 NOTE — Progress Notes (Signed)
Occupational Therapy Session Note  Patient Details  Name: Raymond Swanson MRN: [id number] Date of Birth: 02/03/1925  Today's Date: 02/03/2016 OT Individual Time: 1100-1200 OT Individual Time Calculation (min): 60 min    Short Term Goals: Week 1:  OT Short Term Goal 1 (Week 1): Pt will be able to ambulate to toilet with close S using LRD. OT Short Term Goal 2 (Week 1): Pt will be able to don socks and shoes with min A. OT Short Term Goal 3 (Week 1): Pt will be able to reach into kitchen cupboards without LOB. OT Short Term Goal 4 (Week 1): Pt will demonstrate improved dynamic standing balance with steadying A during functional activities.  Skilled Therapeutic Interventions/Progress Updates:    Pt resting in w/c upon arrival and agreeable to engaging in BADL retraining including bathing at shower level and dressing with sit<>stand from w/c.  Pt amb with RW (steady A) into bathroom and used toilet prior to transferring to shower.  Pt completed bathing tasks with sit<>stand using grab bars.  Pt completed dressing tasks (steady A) with sit<>stand from w/c.  Pt becomes SOB with activity and requires multiple rest breaks during ADLs.  Pt takes rest breaks appropriately.  Discussed energy conservation strategies and pt verbalized understanding.  Focus on activity tolerance, sit<>stand, standing balance, functional amb with RW, and safety awareness to increase independence with BADLs.  Therapy Documentation Precautions:  Precautions Precautions: Fall Restrictions Weight Bearing Restrictions: No  Pain: Pain Assessment Pain Assessment: No/denies pain Pain Score: 0-No pain Pain Intervention(s):   ADL: ADL ADL Comments: steady A overall with BADLs  See Function Navigator for Current Functional Status.   Therapy/Group: Individual Therapy  Leroy Libman 02/03/2016, 12:07 PM

## 2016-02-03 NOTE — Progress Notes (Signed)
Occupational Therapy Session Note  Patient Details  Name: PIER BOSHER MRN: [id number] Date of Birth: May 04, 1925  Today's Date: 02/03/2016 OT Individual Time: 0835-0900 OT Individual Time Calculation (min): 25 min    Short Term Goals: Week 1:  OT Short Term Goal 1 (Week 1): Pt will be able to ambulate to toilet with close S using LRD. OT Short Term Goal 2 (Week 1): Pt will be able to don socks and shoes with min A. OT Short Term Goal 3 (Week 1): Pt will be able to reach into kitchen cupboards without LOB. OT Short Term Goal 4 (Week 1): Pt will demonstrate improved dynamic standing balance with steadying A during functional activities.  Skilled Therapeutic Interventions/Progress Updates:    Pt seen this session to challenge dynamic standing balance and endurance. Pt had just completed toileting with RN and ambulated out of bathroom with RW with S.  Stood at sink to wash hands.  Pt was hungry and wanted to eat. No assist needed to open containers. After eating, pt walked to sink to stand to complete oral care and brush hair. Pt stands with slight knee bend but no posterior lean.  Pt sat in w/c and discussed plans for his next OT session this morning to shower.  Pt in agreement to shower. Pt in w/c with all needs met.  Therapy Documentation Precautions:  Precautions Precautions: Fall Restrictions Weight Bearing Restrictions: No     Pain: Pain Assessment Pain Assessment: No/denies pain Pain Score: 0-No pain Pain Intervention(s): Medication (See eMAR) (scheduled pain medication) ADL: ADL ADL Comments: steady A overall with BADLs  See Function Navigator for Current Functional Status.   Therapy/Group: Individual Therapy  SAGUIER,JULIA 02/03/2016, 10:25 AM

## 2016-02-03 NOTE — Progress Notes (Signed)
Physical Therapy Session Note  Patient Details  Name: Raymond Swanson MRN: [id number] Date of Birth: 1925-04-11  Today's Date: 02/03/2016 PT Individual Time: 1405-1500 PT Individual Time Calculation (min): 55 min   Short Term Goals: Week 1:  PT Short Term Goal 1 (Week 1): Patient will perform sit <> stand with consistent min A and min cues for safe hand placement.  PT Short Term Goal 2 (Week 1): Patient will ambulate 75 ft using LRAD with steady assist.  PT Short Term Goal 3 (Week 1): Patient will negotiate two 6" stairs using R rail with steady assist.   Skilled Therapeutic Interventions/Progress Updates:    Pt received resting in w/c, agreeable to therapy session, no c/o pain.  Session focus on activity tolerance, transfers, gait training with RW, and standing balance.    Nustep x10 minutes +2 minutes at level 4 with BORG rating 14 throughout focus on energy conservation during activity and pacing.    Pt transitions sit<>stand with steady assist at start of session requiring therapist to block LEs from sliding forward due to poor weight shift.  PT instructed pt in repeated sit<>stand x15 reps during card matching activity focus on scooting to edge of seat in order to load LEs to prevent from sliding.  Pt progressed quickly from steady assist to supervision with practice and required only initial education to complete task correctly throughout.  Static standing on foam wedge x2 minutes focus on calf stretch and maintaining full knee extension without hyperextension for carryover into gait.  10 reps minisquats on foam wedge focus on return to full knee extension with mod verbal cues.    Gait training to/from therapy gym with RW and close supervision/steady assist.  Pt initially demonstrates decreased step length bilaterally, forward flexed posture, and bilateral knees flexed in stance.  On return to room, pt requires min verbal cues for upright posture and full knee extension for increased step  length.    Pt returned to bed at end of session with supervision and positioned supine with call bell in reach and needs met.    Therapy Documentation Precautions:  Precautions Precautions: Fall Restrictions Weight Bearing Restrictions: No   See Function Navigator for Current Functional Status.   Therapy/Group: Individual Therapy  Earnest Conroy Penven-Crew 02/03/2016, 2:40 PM

## 2016-02-03 NOTE — Progress Notes (Signed)
[place] PHYSICAL MEDICINE & REHABILITATION     PROGRESS NOTE    Subjective/Complaints: Slept better. Pain is better.  ROS: Pt denies fever, rash/itching, headache, blurred or double vision, nausea, vomiting, abdominal pain, diarrhea,   palpitations, dysuria, dizziness, neck or back pain, bleeding, anxiety, or depression   Objective: Vital Signs: Blood pressure 115/55, pulse 64, temperature 97.8 F (36.6 C), temperature source Oral, resp. rate 16, height 5\' 8"  (1.727 m), weight 60.7 kg (133 lb 13.1 oz), SpO2 95 %. No results found. No results for input(s): WBC, HGB, HCT, PLT in the last 72 hours. No results for input(s): NA, K, CL, GLUCOSE, BUN, CREATININE, CALCIUM in the last 72 hours.  Invalid input(s): CO CBG (last 3)  No results for input(s): GLUCAP in the last 72 hours.  Wt Readings from Last 3 Encounters:  02/03/16 60.7 kg (133 lb 13.1 oz)  01/22/16 62.4 kg (137 lb 9.1 oz)  12/31/15 63.504 kg (140 lb)    Physical Exam:  Constitutional: He is oriented to person, place, and time. He appears well-developed and well-nourished.  Thin elderly male  HENT:  Head: Normocephalic and atraumatic.  Right Ear: External ear normal.       Eyes: Conjunctivae are normal. Pupils are equal, round, and reactive to light.  Neck: Normal range of motion. Neck supple. No tracheal deviation present. No thyromegaly present.  Cardiovascular: Normal rate and regular rhythm. Exam reveals no friction rub.  No murmur heard. Respiratory: Effort normal. He has no wheezes. He has no rales. He exhibits tenderness (left lateral chest).    GI: Soft. Bowel sounds are normal. He exhibits no distension. There is no tenderness.  Musculoskeletal: He exhibits no edema or tenderness.  Neurological: He is alert and oriented to person, place, and time.  HOH. Speech clear.  hearing deficits. Seems to have reasonable insight and awareness.  RUE 4/5 prox to distal. LUE 3-4/5 and limited due to pain in  chest. LE's 4/5 HF,KE and 4+ ADF/PF. No gross sensory abnl.  Skin: Skin is warm and dry.  Psychiatric: He has a normal mood and affect. His speech is normal and behavior is normal.   Assessment/Plan: 1. Functional and cognitive deficits secondary to TBI with polytrauma which require 3+ hours per day of interdisciplinary therapy in a comprehensive inpatient rehab setting. Physiatrist is providing close team supervision and 24 hour management of active medical problems listed below. Physiatrist and rehab team continue to assess barriers to discharge/monitor patient progress toward functional and medical goals.  Function:  Bathing Bathing position   Position: Wheelchair/chair at sink  Bathing parts Body parts bathed by patient: Right arm, Left arm, Chest, Abdomen, Front perineal area, Buttocks, Right upper leg, Left upper leg Body parts bathed by helper: Right lower leg, Back  Bathing assist Assist Level: Touching or steadying assistance(Pt > 75%)      Upper Body Dressing/Undressing Upper body dressing   What is the patient wearing?: Button up shirt         Button up shirt - Perfomed by patient: Thread/unthread right sleeve, Thread/unthread left sleeve, Pull shirt around back, Button/unbutton shirt      Upper body assist Assist Level: Set up, Supervision or verbal cues      Lower Body Dressing/Undressing Lower body dressing   What is the patient wearing?: Underwear, Pants, Socks, Shoes Underwear - Performed by patient: Thread/unthread right underwear leg, Pull underwear up/down Underwear - Performed by helper: Thread/unthread left underwear leg Pants- Performed by patient: Thread/unthread right pants  leg, Pull pants up/down, Fasten/unfasten pants Pants- Performed by helper: Thread/unthread left pants leg       Socks - Performed by helper: Don/doff right sock, Don/doff left sock   Shoes - Performed by helper: Don/doff right shoe, Don/doff left shoe, Fasten right, Fasten  left          Lower body assist Assist for lower body dressing: Touching or steadying assistance (Pt > 75%)      Toileting Toileting   Toileting steps completed by patient: Adjust clothing prior to toileting, Performs perineal hygiene, Adjust clothing after toileting Toileting steps completed by helper: Adjust clothing prior to toileting Toileting Assistive Devices: Grab bar or rail  Toileting assist Assist level: Touching or steadying assistance (Pt.75%)   Transfers Chair/bed transfer   Chair/bed transfer method: Stand pivot Chair/bed transfer assist level: Moderate assist (Pt 50 - 74%/lift or lower) Chair/bed transfer assistive device: Armrests, Medical sales representative     Max distance: 130 Assist level: Touching or steadying assistance (Pt > 75%)   Wheelchair   Type: Manual Max wheelchair distance: 150 Assist Level: Supervision or verbal cues  Cognition Comprehension Comprehension assist level: Follows complex conversation/direction with extra time/assistive device  Expression Expression assist level: Expresses complex ideas: With extra time/assistive device  Social Interaction Social Interaction assist level: Interacts appropriately with others with medication or extra time (anti-anxiety, antidepressant).  Problem Solving Problem solving assist level: Solves complex 90% of the time/cues < 10% of the time  Memory Memory assist level: Recognizes or recalls 90% of the time/requires cueing < 10% of the time   Medical Problem List and Plan: 1. Functional, cognitive, mobility deficits secondary to TBI/polytrauma (including L1, T8 fx's) after fall -continue CIR therapies. 2. DVT Prophylaxis/Anticoagulation: Mechanical: Sequential compression devices, below knee Bilateral lower extremities 3. Pain Management: Continue ultram and tylenol qid--  Low dose scheduled oxycodone prior to therapy sessions--observe for oversedation 4. Mood: provided  encouragement. ?neuropsych  - scheduled low dose trazodone for sleep  5. Neuropsych: This patient is capable of making decisions on his own behalf. 6. Skin/Wound Care: Monitor abrasion for healing.  7. Fluids/Electrolytes/Nutrition: Monitor I/O. Encourage PO  -follow up labs Monday  -added megace for appetite. Ate better yesterday 8. CAD: On Zocor. No ASA due to bleed.  9. Hypotension: encourage fluid intake. Monitor bid.  10. Anemia: labs pending  11. Leucocytosis: Likely reactive. Monitor for signs of infection or fevers.  12. Left rib fractures with Hypoxia: Encourage IS--oxygen prn with activity  -pain improving  -lidoderm patches to rib cage  -kpad 13. Constipaton: bm today   LOS (Days) 6 A FACE TO FACE EVALUATION WAS PERFORMED  SWARTZ,ZACHARY T 02/03/2016 9:13 AM

## 2016-02-04 ENCOUNTER — Inpatient Hospital Stay (HOSPITAL_COMMUNITY): Payer: Medicare Other | Admitting: Physical Therapy

## 2016-02-04 ENCOUNTER — Inpatient Hospital Stay (HOSPITAL_COMMUNITY): Payer: Medicare Other

## 2016-02-04 ENCOUNTER — Inpatient Hospital Stay (HOSPITAL_COMMUNITY): Payer: Medicare Other | Admitting: Occupational Therapy

## 2016-02-04 LAB — BASIC METABOLIC PANEL
Anion gap: 5 (ref 5–15)
BUN: 23 mg/dL — AB (ref 6–20)
CO2: 29 mmol/L (ref 22–32)
CREATININE: 1.14 mg/dL (ref 0.61–1.24)
Calcium: 9.2 mg/dL (ref 8.9–10.3)
Chloride: 106 mmol/L (ref 101–111)
GFR calc Af Amer: >60 mL/min (ref >60)
GFR, EST NON AFRICAN AMERICAN: 55 mL/min — AB (ref >60)
GLUCOSE: 102 mg/dL — AB (ref 65–99)
POTASSIUM: 4.5 mmol/L (ref 3.5–5.1)
Sodium: 140 mmol/L (ref 135–145)

## 2016-02-04 NOTE — Progress Notes (Signed)
Physical Therapy Session Note  Patient Details  Name: Raymond Swanson MRN: [id number] Date of Birth: 10/31/1924  Today's Date: 02/04/2016 PT Individual Time: 1500-1536  PT Individual Time Calculation (min): 36 min Make up minutes  Short Term Goals: Week 1:  PT Short Term Goal 1 (Week 1): Patient will perform sit <> stand with consistent min A and min cues for safe hand placement.  PT Short Term Goal 2 (Week 1): Patient will ambulate 75 ft using LRAD with steady assist.  PT Short Term Goal 3 (Week 1): Patient will negotiate two 6" stairs using R rail with steady assist.   Skilled Therapeutic Interventions/Progress Updates:   Pt received in bed; pt fatigued from therapy but willing to participate in therapy in the room.  Pt agreeable to balance assessment.  Performed supine > sit with bed rail and supervision.  Pt participated in BERG balance assessment with details below.  Throughout testing pt demonstrated strong L lateral lean and frequent posterior LOB.  Pt score, falls risk, specific balance impairments and safety recommendations discussed with patient.  Pt verbalized agreement.  Pt returned to supine and repositioned at [facility] with supervision-min A.  Pt left with all items within reach.  Therapy Documentation Precautions:  Precautions Precautions: Fall Restrictions Weight Bearing Restrictions: No Vital Signs: Therapy Vitals Temp: 98.1 F (36.7 C) Temp Source: Oral Pulse Rate: 91 Resp: 18 BP: (!) 104/52 mmHg Patient Position (if appropriate): Lying Oxygen Therapy SpO2: 95 % O2 Device: Not Delivered Pain: Pain Assessment Pain Assessment: No/denies pain Pain Score: 0-No pain Pain Intervention(s): Medication (See eMAR) (scheduled pain medication) Balance: Standardized Balance Assessment Standardized Balance Assessment: Berg Balance Test Berg Balance Test Sit to Stand: Needs minimal aid to stand or to stabilize Standing Unsupported: Able to stand 2 minutes with  supervision Sitting with Back Unsupported but Feet Supported on Floor or Stool: Able to sit safely and securely 2 minutes Stand to Sit: Controls descent by using hands Transfers: Needs one person to assist Standing Unsupported with Eyes Closed: Needs help to keep from falling Standing Ubsupported with Feet Together: Needs help to attain position and unable to hold for 15 seconds From Standing, Reach Forward with Outstretched Arm: Loses balance while trying/requires external support From Standing Position, Pick up Object from Floor: Unable to try/needs assist to keep balance From Standing Position, Turn to Look Behind Over each Shoulder: Needs assist to keep from losing balance and falling Turn 360 Degrees: Needs assistance while turning Standing Unsupported, Alternately Place Feet on Step/Stool: Needs assistance to keep from falling or unable to try Standing Unsupported, One Foot in Front: Loses balance while stepping or standing Standing on One Leg: Unable to try or needs assist to prevent fall Total Score: 12 Patient demonstrates increased fall risk as noted by score of 12/56 on Berg Balance Scale.  (<36= high risk for falls, close to 100%; 37-45 significant >80%; 46-51 moderate >50%; 52-55 lower >25%)   See Function Navigator for Current Functional Status.   Therapy/Group: Individual Therapy  Raymond Swanson [facility] 02/04/2016, 4:03 PM

## 2016-02-04 NOTE — Progress Notes (Signed)
Occupational Therapy Session Note  Patient Details  Name: Raymond Swanson MRN: [id number] Date of Birth: 12-Dec-1924  Today's Date: 02/04/2016 OT Individual Time: 1301-1402 OT Individual Time Calculation (min): 61 min    Skilled Therapeutic Interventions/Progress Updates:    Pt donned shorts and shoes to begin session, sit to stand on EOB.  Used RW for ambulation to the therapy gym with focus on dynamic standing balance while reaching to gather parts for PVC pipe tree puzzle.  Pt needing min assist for balance in standing with increased lean to the left as well as increased knee flexion in the LLE, after standing for a few mins.  Mod instructional cueing in order to solve simple puzzle and recognize puzzle piece differences in the picture he was given to re-produce.  Finished session with transition to the ADL apartment with practice on walk-in shower transfers using the simulated shower edge.  He needed min assist to complete.  He reports having a small grab bar in the shower.  Discussed adding a second as this will make it safer transitioning in and out, especially with current use of the walker.  Pt ambulated from the ADL apartment back to his room with min guard assist using the RW.  Pt left in bed with call button in reach and safety belt in place.    Therapy Documentation Precautions:  Precautions Precautions: Fall Restrictions Weight Bearing Restrictions: No  Pain: Pain Assessment Pain Assessment: No/denies pain Pain Score: 0-No pain Pain Intervention(s): Medication (See eMAR) (scheduled pain medication) ADL: See Function Navigator for Current Functional Status.   Therapy/Group: Individual Therapy  Joseluis Alessio OTR/L 02/04/2016, 2:40 PM

## 2016-02-04 NOTE — Progress Notes (Signed)
[place] PHYSICAL MEDICINE & REHABILITATION     PROGRESS NOTE    Subjective/Complaints: Had some fatigue late in day yesterday.   ROS: Pt denies fever, rash/itching, headache, blurred or double vision, nausea, vomiting, abdominal pain, diarrhea,   palpitations, dysuria, dizziness, neck or back pain, bleeding, anxiety, or depression   Objective: Vital Signs: Blood pressure 97/61, pulse 71, temperature 98.4 F (36.9 C), temperature source Oral, resp. rate 17, height 5\' 8"  (1.727 m), weight 60.7 kg (133 lb 13.1 oz), SpO2 94 %. No results found. No results for input(s): WBC, HGB, HCT, PLT in the last 72 hours.  Recent Labs  02/04/16 0439  NA 140  K 4.5  CL 106  GLUCOSE 102*  BUN 23*  CREATININE 1.14  CALCIUM 9.2   CBG (last 3)  No results for input(s): GLUCAP in the last 72 hours.  Wt Readings from Last 3 Encounters:  02/03/16 60.7 kg (133 lb 13.1 oz)  01/22/16 62.4 kg (137 lb 9.1 oz)  12/31/15 63.504 kg (140 lb)    Physical Exam:  Constitutional: He is oriented to person, place, and time. He appears well-developed and well-nourished.  Thin elderly male  HENT:  Head: Normocephalic and atraumatic.  Right Ear: External ear normal.       Eyes: Conjunctivae are normal. Pupils are equal, round, and reactive to light.  Neck: Normal range of motion. Neck supple. No tracheal deviation present. No thyromegaly present.  Cardiovascular: Normal rate and regular rhythm. Exam reveals no friction rub.  No murmur heard. Respiratory: Effort normal. He has no wheezes. He has no rales. He exhibits tenderness left chest    GI: Soft. Bowel sounds are normal. He exhibits no distension. There is no tenderness.  Musculoskeletal: He exhibits no edema or tenderness.  Neurological: He is alert and oriented to person, place, and time.  HOH. Speech clear.  hearing deficits. Seems to have reasonable insight and awareness.  RUE 4/5 prox to distal. LUE 3-4/5 and limited due to pain in  chest. LE's 4/5 HF,KE and 4+ ADF/PF. No gross sensory abnl.  Skin: Skin is warm and dry.  Psychiatric: He has a normal mood and affect. His speech is normal and behavior is normal.   Assessment/Plan: 1. Functional and cognitive deficits secondary to TBI with polytrauma which require 3+ hours per day of interdisciplinary therapy in a comprehensive inpatient rehab setting. Physiatrist is providing close team supervision and 24 hour management of active medical problems listed below. Physiatrist and rehab team continue to assess barriers to discharge/monitor patient progress toward functional and medical goals.  Function:  Bathing Bathing position   Position: Shower  Bathing parts Body parts bathed by patient: Right arm, Left arm, Abdomen, Chest, Front perineal area, Buttocks, Right upper leg, Left upper leg, Right lower leg, Left lower leg Body parts bathed by helper: Right lower leg, Back  Bathing assist Assist Level: Touching or steadying assistance(Pt > 75%)      Upper Body Dressing/Undressing Upper body dressing   What is the patient wearing?: Button up shirt         Button up shirt - Perfomed by patient: Thread/unthread right sleeve, Thread/unthread left sleeve, Pull shirt around back, Button/unbutton shirt      Upper body assist Assist Level: Set up, Supervision or verbal cues      Lower Body Dressing/Undressing Lower body dressing   What is the patient wearing?: Underwear, Pants, Socks, Shoes Underwear - Performed by patient: Thread/unthread right underwear leg, Pull underwear up/down, Thread/unthread  left underwear leg Underwear - Performed by helper: Thread/unthread left underwear leg Pants- Performed by patient: Thread/unthread right pants leg, Pull pants up/down, Fasten/unfasten pants Pants- Performed by helper: Thread/unthread right pants leg, Thread/unthread left pants leg, Pull pants up/down, Fasten/unfasten pants     Socks - Performed by patient: Don/doff  right sock, Don/doff left sock Socks - Performed by helper: Don/doff right sock, Don/doff left sock Shoes - Performed by patient: Don/doff right shoe, Don/doff left shoe, Fasten right, Fasten left Shoes - Performed by helper: Don/doff right shoe, Don/doff left shoe, Fasten right, Fasten left          Lower body assist Assist for lower body dressing: Touching or steadying assistance (Pt > 75%)      Toileting Toileting   Toileting steps completed by patient: Adjust clothing prior to toileting, Performs perineal hygiene, Adjust clothing after toileting Toileting steps completed by helper: Adjust clothing prior to toileting Toileting Assistive Devices: Grab bar or rail  Toileting assist Assist level: Supervision or verbal cues   Transfers Chair/bed transfer   Chair/bed transfer method: Stand pivot, Ambulatory Chair/bed transfer assist level: Supervision or verbal cues Chair/bed transfer assistive device: Armrests, Medical sales representative     Max distance: 150 Assist level: Supervision or verbal cues   Wheelchair   Type: Manual Max wheelchair distance: 150 Assist Level: Supervision or verbal cues  Cognition Comprehension Comprehension assist level: Follows complex conversation/direction with extra time/assistive device  Expression Expression assist level: Expresses complex ideas: With extra time/assistive device  Social Interaction Social Interaction assist level: Interacts appropriately with others with medication or extra time (anti-anxiety, antidepressant).  Problem Solving Problem solving assist level: Solves complex 90% of the time/cues < 10% of the time  Memory Memory assist level: Recognizes or recalls 90% of the time/requires cueing < 10% of the time   Medical Problem List and Plan: 1. Functional, cognitive, mobility deficits secondary to TBI/polytrauma (including L1, T8 fx's) after fall -continue CIR therapies. conf today 2. DVT  Prophylaxis/Anticoagulation: Mechanical: Sequential compression devices, below knee Bilateral lower extremities 3. Pain Management: Continue ultram and tylenol qid--   scheduled oxycodone prior to therapy sessions--observe for oversedation 4. Mood: provided encouragement. ?neuropsych  - scheduled low dose trazodone for sleep  5. Neuropsych: This patient is capable of making decisions on his own behalf. 6. Skin/Wound Care: Monitor abrasion for healing.  7. Fluids/Electrolytes/Nutrition: Monitor I/O. Encourage PO fluids  -follow up labs all personally reviewed. BUN trending up  -continue megace for appetite.   8. CAD: On Zocor. No ASA due to bleed.  9. Hypotension: encourage fluid intake. Monitor bid.  10. Anemia: labs pending  11. Leucocytosis: Likely reactive. Monitor for signs of infection or fevers.  12. Left rib fractures with Hypoxia: Encourage IS--oxygen prn with activity  -pain improving  -lidoderm patches to rib cage  -kpad 13. Constipaton: bm today   LOS (Days) 7 A FACE TO FACE EVALUATION WAS PERFORMED  SWARTZ,ZACHARY T 02/04/2016 8:45 AM

## 2016-02-04 NOTE — Progress Notes (Signed)
Nutrition Follow-up  DOCUMENTATION CODES:   Severe malnutrition in context of chronic illness  INTERVENTION:  Continue Boost Breeze po TID, each supplement provides 250 kcal and 9 grams of protein.  Encourage adequate PO intake.   NUTRITION DIAGNOSIS:   Malnutrition related to chronic illness as evidenced by severe depletion of body fat, severe depletion of muscle mass; ongoing  GOAL:   Patient will meet greater than or equal to 90% of their needs; met  MONITOR:   PO intake, Supplement acceptance, Weight trends, Labs, I & O's, Skin  REASON FOR ASSESSMENT:   Malnutrition Screening Tool    ASSESSMENT:   82 y.o. right handed male with history of hypertension, prostate cancer, CAD with CABG. Presented 01/22/2016 after a fall while cleaning some gutters. CT of the head images revealed moderate left parietal scalp contusion, acute right frontotemporal subdural hematoma measuring up to 4 mm, acute subarachnoid hemorrhage throughout the right cerebral sulci. Hemorrhagic cortical contusions and subarachnoid hemorrhage bilateral anterior frontal lobes.  Meal completion has been 50-100%. Pt currently has Boost Breeze ordered and has been consuming most of them. RD to continue with current orders. Pt encouraged to eat his food at meals. Labs and medications reviewed.   Diet Order:  Diet regular Room service appropriate?: Yes; Fluid consistency:: Thin  Skin:  Wound (see comment) (Stage I pressure ulcer on coccyx)  Last BM:  6/13  Height:   Ht Readings from Last 1 Encounters:  01/28/16 5' 8"  (1.727 m)    Weight:   Wt Readings from Last 1 Encounters:  02/04/16 136 lb 0.4 oz (61.7 kg)    Ideal Body Weight:  70 kg  BMI:  Body mass index is 20.69 kg/(m^2).  Estimated Nutritional Needs:   Kcal:  1600-1800  Protein:  75-85 grams  Fluid:  1.6 - 1.8 L/day  EDUCATION NEEDS:   No education needs identified at this time  Corrin Parker, MS, RD, LDN Pager #  [phone] After hours/ weekend pager # [phone]

## 2016-02-04 NOTE — Progress Notes (Signed)
Occupational Therapy Session Note  Patient Details  Name: Raymond Swanson MRN: [id number] Date of Birth: 29-Nov-1924  Today's Date: 02/04/2016 OT Individual Time: 1100-1200 OT Individual Time Calculation (min): 60 min    Short Term Goals: Week 1:  OT Short Term Goal 1 (Week 1): Pt will be able to ambulate to toilet with close S using LRD. OT Short Term Goal 2 (Week 1): Pt will be able to don socks and shoes with min A. OT Short Term Goal 3 (Week 1): Pt will be able to reach into kitchen cupboards without LOB. OT Short Term Goal 4 (Week 1): Pt will demonstrate improved dynamic standing balance with steadying A during functional activities.  Skilled Therapeutic Interventions/Progress Updates:    Pt resting in bed upon arrival.  Pt declined bathing this morning stating he didn't do anything the previous day to warrant a shower.  Pt explained that he had just returned from exercises with therapy and didn't see the need for any additional exercises.  Pt declined walking.  Pt finally agreed to BUE therex at bed level including 5# weight bar, theraband, and 2.2kg weight ball.  Pt exhibited SOB during each set and required extended rest breaks between rest breaks.   Therapy Documentation Precautions:  Precautions Precautions: Fall Restrictions Weight Bearing Restrictions: No   Pain: Pt c/o discomfort left posterior ribs; heat applied, RN aware ADL: ADL ADL Comments: steady A overall with BADLs  See Function Navigator for Current Functional Status.   Therapy/Group: Individual Therapy  Leroy Libman 02/04/2016, 12:14 PM

## 2016-02-04 NOTE — Progress Notes (Signed)
Physical Therapy Session Note  Patient Details  Name: Raymond Swanson MRN: [id number] Date of Birth: 10-29-1924  Today's Date: 02/04/2016 PT Individual Time: 1000-1100 PT Individual Time Calculation (min): 60 min   Short Term Goals: Week 1:  PT Short Term Goal 1 (Week 1): Patient will perform sit <> stand with consistent min A and min cues for safe hand placement.  PT Short Term Goal 2 (Week 1): Patient will ambulate 75 ft using LRAD with steady assist.  PT Short Term Goal 3 (Week 1): Patient will negotiate two 6" stairs using R rail with steady assist.   Skilled Therapeutic Interventions/Progress Updates:   Patient in wheelchair upon arrival. Co-treat with recreational therapist to address sit <> stand transfers using RW with min verbal cues for hand placement, gait training using RW in controlled environment x 150 ft + 75 ft + 225 ft with supervision and increased time with verbal cues for upright posture and increased step length, and basic cooking task in ADL kitchen with focus on retrieving items from cabinets, safety using RW and transporting items in kitchen, and standing balance with single or no UE support during bimanual tasks, and overall activity tolerance. Patient completed some tasks in standing and utilized kitchen stool for remainder of time as he fatigued, demonstrated by progressive increased BLE flexion. Engaged in discharge planning during seated rest with patient voicing concern about spouse's ability to provide care due to her own health problems (inner ear issues that have been ongoing with no successful treatment per patient). Patient ambulated back to room and left sitting edge of bed with RN present.    Therapy Documentation Precautions:  Precautions Precautions: Fall Restrictions Weight Bearing Restrictions: No Pain: Pain Assessment Pain Assessment: No/denies pain  See Function Navigator for Current Functional Status.   Therapy/Group: Individual  Therapy  Laretta Alstrom 02/04/2016, 12:19 PM

## 2016-02-04 NOTE — Progress Notes (Signed)
Recreational Therapy Assessment and Plan  Patient Details  Name: Raymond Swanson MRN: [id number] Date of Birth: Oct 02, 1924 Today's Date: 02/04/2016  Rehab Potential: Good ELOS: 10 days   Assessment Clinical Impression: Problem List:  Patient Active Problem List   Diagnosis Date Noted  . Traumatic intracranial subdural hematoma (HCC) 01/28/2016  . Fall 01/27/2016  . Fracture of multiple ribs of left side 01/27/2016  . T8 vertebral fracture ([place]) 01/27/2016  . L1 vertebral fracture ([place]) 01/27/2016  . Lumbar transverse process fracture ([place]) 01/27/2016  . Pressure ulcer 01/26/2016  . Traumatic subdural hematoma with loss of consciousness of 30 minutes or less ([place]) 01/22/2016  . ANEMIA 10/08/2009  . ACUT GASTR ULCER W/HEMORR W/O MENTION OBST 09/10/2009  . MELENA 08/28/2009  . Hypothyroidism 10/18/2007  . Dyslipidemia 01/26/2007  . Essential hypertension 01/26/2007  . Coronary atherosclerosis 01/26/2007  . PROSTATE CANCER, HX OF 01/26/2007  . SKIN CANCER, HX OF 01/26/2007    Past Medical History:  Past Medical History  Diagnosis Date  . Cancer ([place])   . ACUT GASTR ULCER W/HEMORR W/O MENTION OBST 09/10/2009  . ANEMIA 10/08/2009  . CORONARY ARTERY DISEASE 01/26/2007  . HYPERLIPIDEMIA 01/26/2007  . HYPERTENSION 01/26/2007  . HYPOTHYROIDISM 10/18/2007  . MELENA 08/28/2009  . PROSTATE CANCER, HX OF 01/26/2007  . SKIN CANCER, HX OF 01/26/2007   Past Surgical History:  Past Surgical History  Procedure Laterality Date  . Coronary artery bypass graft      1991  . Hernia repair      ingunial  . Prostate surgery      prostatectomy  . Mohs surgery    . Cataract extraction      Assessment & Plan Clinical Impression: Raymond Swanson is an 82 year old gentleman with history of CAD, macular degeneration, prostate cancer who was reportedly was cleaning some gutters when  he fell 3 feet off the ladder with amnesia of events. He was found down 3 or 4 hours later and was evaluated in ED. CT head/neck reavealed moderate left parietal scalp contusion, acute R-frontotemporal SDH, acute hemorrhagic cortical and SAH bilateral frontal lobes and moderate degenerative changes cervical spine. Also found to have left 6th- 11th rib fractures, new small air-filled collections LUL likely traumatic pneumoceles, mesenteric edema, L-4 transverse process fracture, compression fractures T8 (age indeterminate) and L1. Patient with complaints of HA and pain in left ribs. Dr. Saintclair Halsted evaluated patient and recommended serial CT head for monitoring. Repeat CT head 06/01 showed non-displaced left temporal parietal skull fracture and interval progression of right SAH, right frontal and parietal hematomas, right frontal hemorrhagic contusion and left SDH. Mental status has been stable but he continues to be limited by pain left chest, tachycardia with activity and hypoxia with saturations in mid 80's with activity. Patient transferred to CIR on 01/28/2016.      Pt presents with decreased activity tolerance, decreased functional mobility, decreased balance Limiting pt's independence with leisure/community pursuits.   Leisure History/Participation Premorbid leisure interest/current participation: Petra [place] - Flower gardening;Nature - [place] care;Community - Shopping mall;Community - Grocery store;Community - Travel (Comment);Sports - Exercise (Comment);Sports - Other (Comment) (bowling) Other Leisure Interests: Television;Cooking/Baking;Computer;Reading Leisure Participation Style: With Family/Friends Awareness of Community Resources: Excellent Psychosocial / Spiritual Patient agreeable to Pet Therapy: Yes Does patient have pets?: Yes (Merchandiser, retail) Social interaction - Mood/Behavior: Cooperative Academic librarian Appropriate for Education?: Yes Patient Agreeable to Outing?: Yes Recreational  Therapy Orientation Orientation -Reviewed with patient: Available activity resources Strengths/Weaknesses Patient Strengths/Abilities: Willingness to participate;Active premorbidly Patient weaknesses:  Physical limitations TR Patient demonstrates impairments in the following area(s): Endurance;Motor;Safety  Plan Rec Therapy Plan Is patient appropriate for Therapeutic Recreation?: Yes Rehab Potential: Good Treatment times per week: Min 1 time per week >20 minutes Estimated Length of Stay: 10 days TR Treatment/Interventions: Adaptive equipment instruction;1:1 session;Balance/vestibular training;Functional mobility training;Patient/family education;Community reintegration;Recreation/leisure participation;Therapeutic activities;UE/LE Coordination activities;Therapeutic exercise  Recommendations for other services: None  Discharge Criteria: Patient will be discharged from TR if patient refuses treatment 3 consecutive times without medical reason.  If treatment goals not met, if there is a change in medical status, if patient makes no progress towards goals or if patient is discharged from hospital.  The above assessment, treatment plan, treatment alternatives and goals were discussed and mutually agreed upon: by patient  [place] 02/04/2016, 3:44 PM

## 2016-02-05 ENCOUNTER — Inpatient Hospital Stay (HOSPITAL_COMMUNITY): Payer: Medicare Other | Admitting: Physical Therapy

## 2016-02-05 ENCOUNTER — Inpatient Hospital Stay (HOSPITAL_COMMUNITY): Payer: Medicare Other

## 2016-02-05 ENCOUNTER — Inpatient Hospital Stay (HOSPITAL_COMMUNITY): Payer: Medicare Other | Admitting: *Deleted

## 2016-02-05 MED ORDER — TRAMADOL HCL 50 MG PO TABS: 25.0000 mg | ORAL_TABLET | Freq: Four times a day (QID) | ORAL | Status: DC | PRN

## 2016-02-05 MED ORDER — ACETAMINOPHEN 325 MG PO TABS
325.0000 mg | ORAL_TABLET | Freq: Four times a day (QID) | ORAL | Status: DC
  Administered 2016-02-05 – 2016-02-07 (×6): 325 mg via ORAL
  Filled 2016-02-05 (×6): qty 1

## 2016-02-05 MED ORDER — ACETAMINOPHEN 325 MG PO TABS: 650.0000 mg | ORAL_TABLET | Freq: Four times a day (QID) | ORAL | Status: DC | PRN

## 2016-02-05 MED ORDER — TRAMADOL HCL 50 MG PO TABS: 50.0000 mg | ORAL_TABLET | Freq: Four times a day (QID) | ORAL | Status: DC | PRN

## 2016-02-05 NOTE — Progress Notes (Signed)
Recreational Therapy Discharge Summary Patient Details  Name: Raymond Swanson MRN: [id number] Date of Birth: September 13, 1924 Today's Date: 02/05/2016  Long term goals set: 1  Long term goals met: 1  Comments on progress toward goals: Pt met community reintegrationLTG of min assist ambulatory level using RW.  Pt requires min verbal cues for safety and steadying assist for safety during community pursuits.  Pt is scheduled for discharge home 6/16.  Reasons for discharge: discharge from hospital   Patient/family agrees with progress made and goals achieved: Yes  [name] 02/05/2016, 12:46 PM

## 2016-02-05 NOTE — Progress Notes (Addendum)
Occupational Therapy Note  Patient Details  Name: Raymond Swanson MRN: [id number] Date of Birth: 14-Nov-1924  Today's Date: 02/05/2016.   OT Concurrent Time: [id number] OT Concurrent Time Calculation (min): 105 min  Pt denied pain Concurrent Therapy  Pt engaged in community integration outing to [employer].  Focus on activity tolerance, endurance, functional amb in community environment, social interaction with therapists and fellow participants.  Pt required min A for accessing van using the steps, sit<>stand from booth seat at restaurant, and steady A for ambulation in graded surface to and from [place].  Pt interacted appropriately throughout session. Goal sheet placed in shadow chart.  [name] 02/05/2016, 12:38 PM

## 2016-02-05 NOTE — Progress Notes (Signed)
[place] PHYSICAL MEDICINE & REHABILITATION     PROGRESS NOTE    Subjective/Complaints: Pain and appetite improved. Trying to drink more.   ROS: Pt denies fever, rash/itching, headache, blurred or double vision, nausea, vomiting, abdominal pain, diarrhea,   palpitations, dysuria, dizziness, neck or back pain, bleeding, anxiety, or depression   Objective: Vital Signs: Blood pressure 122/59, pulse 79, temperature 98.7 F (37.1 C), temperature source Oral, resp. rate 17, height 5\' 8"  (1.727 m), weight 61 kg (134 lb 7.7 oz), SpO2 94 %. No results found. No results for input(s): WBC, HGB, HCT, PLT in the last 72 hours.  Recent Labs  02/04/16 0439  NA 140  K 4.5  CL 106  GLUCOSE 102*  BUN 23*  CREATININE 1.14  CALCIUM 9.2   CBG (last 3)  No results for input(s): GLUCAP in the last 72 hours.  Wt Readings from Last 3 Encounters:  02/05/16 61 kg (134 lb 7.7 oz)  01/22/16 62.4 kg (137 lb 9.1 oz)  12/31/15 63.504 kg (140 lb)    Physical Exam:  Constitutional: He is oriented to person, place, and time. He appears well-developed and well-nourished.  Thin elderly male  HENT:  Head: Normocephalic and atraumatic.  Right Ear: External ear normal.       Eyes: Conjunctivae are normal. Pupils are equal, round, and reactive to light.  Neck: Normal range of motion. Neck supple. No tracheal deviation present. No thyromegaly present.  Cardiovascular: Normal rate and regular rhythm. Exam reveals no friction rub.  No murmur heard. Respiratory: Effort normal. He has no wheezes. He has no rales. He exhibits tenderness left chest    GI: Soft. Bowel sounds are normal. He exhibits no distension. There is no tenderness.  Musculoskeletal: He exhibits no edema or tenderness.  Neurological: He is alert and oriented to person, place, and time.  HOH. Speech clear.  hearing deficits. Seems to have reasonable insight and awareness.  RUE 4/5 prox to distal. LUE 3-4/5 and limited due to pain  in chest. LE's 4/5 HF,KE and 4+ ADF/PF. No gross sensory abnl.  Skin: Skin is warm and dry.  Psychiatric: He has a normal mood and affect. His speech is normal and behavior is normal.   Assessment/Plan: 1. Functional and cognitive deficits secondary to TBI with polytrauma which require 3+ hours per day of interdisciplinary therapy in a comprehensive inpatient rehab setting. Physiatrist is providing close team supervision and 24 hour management of active medical problems listed below. Physiatrist and rehab team continue to assess barriers to discharge/monitor patient progress toward functional and medical goals.  Function:  Bathing Bathing position   Position: Shower  Bathing parts Body parts bathed by patient: Right arm, Left arm, Abdomen, Chest, Front perineal area, Buttocks, Right upper leg, Left upper leg, Right lower leg, Left lower leg Body parts bathed by helper: Right lower leg, Back  Bathing assist Assist Level: Touching or steadying assistance(Pt > 75%)      Upper Body Dressing/Undressing Upper body dressing   What is the patient wearing?: Button up shirt         Button up shirt - Perfomed by patient: Thread/unthread right sleeve, Thread/unthread left sleeve, Pull shirt around back, Button/unbutton shirt      Upper body assist Assist Level: Set up, Supervision or verbal cues      Lower Body Dressing/Undressing Lower body dressing   What is the patient wearing?: Pants, Shoes Underwear - Performed by patient: Thread/unthread right underwear leg, Pull underwear up/down, Thread/unthread left  underwear leg Underwear - Performed by helper: Thread/unthread left underwear leg Pants- Performed by patient: Thread/unthread right pants leg, Thread/unthread left pants leg, Fasten/unfasten pants, Pull pants up/down Pants- Performed by helper: Thread/unthread right pants leg, Thread/unthread left pants leg, Pull pants up/down, Fasten/unfasten pants     Socks - Performed by  patient: Don/doff right sock, Don/doff left sock Socks - Performed by helper: Don/doff right sock, Don/doff left sock Shoes - Performed by patient: Don/doff right shoe, Don/doff left shoe, Fasten right, Fasten left Shoes - Performed by helper: Don/doff right shoe, Don/doff left shoe, Fasten right, Fasten left          Lower body assist Assist for lower body dressing: Touching or steadying assistance (Pt > 75%)      Toileting Toileting   Toileting steps completed by patient: Adjust clothing prior to toileting, Performs perineal hygiene, Adjust clothing after toileting Toileting steps completed by helper: Adjust clothing prior to toileting Toileting Assistive Devices: Grab bar or rail  Toileting assist Assist level: Supervision or verbal cues   Transfers Chair/bed transfer   Chair/bed transfer method: Ambulatory Chair/bed transfer assist level: Supervision or verbal cues Chair/bed transfer assistive device: Armrests, Medical sales representative     Max distance: 150 Assist level: Supervision or verbal cues   Wheelchair   Type: Manual Max wheelchair distance: 150 Assist Level: Supervision or verbal cues  Cognition Comprehension Comprehension assist level: Follows complex conversation/direction with extra time/assistive device  Expression Expression assist level: Expresses complex ideas: With extra time/assistive device  Social Interaction Social Interaction assist level: Interacts appropriately with others with medication or extra time (anti-anxiety, antidepressant).  Problem Solving Problem solving assist level: Solves complex 90% of the time/cues < 10% of the time  Memory Memory assist level: Recognizes or recalls 90% of the time/requires cueing < 10% of the time   Medical Problem List and Plan: 1. Functional, cognitive, mobility deficits secondary to TBI/polytrauma (including L1, T8 fx's) after fall -continue CIR therapies. Improving mobility and  pain  -wife to come in today? 2. DVT Prophylaxis/Anticoagulation: Mechanical: Sequential compression devices, below knee Bilateral lower extremities 3. Pain Management: Continue ultram and tylenol qid--  dc scheduled oxycodone prior to therapy sessions-- 4. Mood: provided encouragement. ?neuropsych  - scheduled low dose trazodone for sleep  5. Neuropsych: This patient is capable of making decisions on his own behalf. 6. Skin/Wound Care: Monitor abrasion for healing.  7. Fluids/Electrolytes/Nutrition: Monitor I/O. Encourage PO fluids  -follow up labs all personally reviewed. BUN trending up  -continue megace for appetite.   8. CAD: On Zocor. No ASA due to bleed.  9. Hypotension: encourage fluid intake. Monitor bid.  10. Anemia: labs pending  11. Leucocytosis: Likely reactive. Monitor for signs of infection or fevers.  12. Left rib fractures with Hypoxia: Encourage IS--oxygen prn with activity  -pain improving  -lidoderm patches to rib cage  -kpad 13. Constipaton: bm today   LOS (Days) 8 A FACE TO FACE EVALUATION WAS PERFORMED  SWARTZ,ZACHARY T 02/05/2016 9:08 AM

## 2016-02-05 NOTE — Progress Notes (Signed)
Physical Therapy Note  Patient Details  Name: Raymond Swanson MRN: [id number] Date of Birth: 02-05-1925 Today's Date: 02/05/2016  Patient asleep in bed. Patient refused 30 min skilled therapy due to fatigue following outing this AM. Will f/u per POC.    [name] A 02/05/2016, 2:11 PM

## 2016-02-05 NOTE — Progress Notes (Signed)
Recreational Therapy Session Note  Patient Details  Name: Raymond Swanson MRN: [id number] Date of Birth: 1925/02/20 Today's Date: 02/05/2016  Pain: no c/oSkilled Therapeutic Interventions/Progress Updates: Pt agreeable to participate in community reintegration/outing today.  Pt participated at overall steady assist ambulatory level using RW. Goals focused on safe functional mobility on various community surface types, identification & negotiation of obstacles, accessing public restrooms & energy conservation.  See outing goal sheet in shadow chart for full details.  Therapy/Group: [employer]  Shyne Resch 02/05/2016, 8:41 AM

## 2016-02-05 NOTE — Progress Notes (Signed)
Physical Therapy Session Note  Patient Details  Name: Raymond Swanson MRN: [id number] Date of Birth: [birth year]  Today's Date: 02/05/2016 PT Individual Time: 0805-0900  PT Individual Time Calculation (min): 55 min  Short Term Goals: Week 1:  PT Short Term Goal 1 (Week 1): Patient will perform sit <> stand with consistent min A and min cues for safe hand placement.  PT Short Term Goal 2 (Week 1): Patient will ambulate 75 ft using LRAD with steady assist.  PT Short Term Goal 3 (Week 1): Patient will negotiate two 6" stairs using R rail with steady assist.   Skilled Therapeutic Interventions/Progress Updates:  Patient in bed upon arrival. Discussed outing planned for today, patient only has house shoes but patient unable to call wife on phone due to hearing impairment, will call and ask her to bring walking shoes as able today. Patient sat edge of bed with increased time. Gait training using RW 2 x 150 ft with supervision and increased gait speed. Performed static standing on foam wedge using RW for BUE support 2 trials x 1 min with focus on upright posture to facilitate heel cord stretch. Instructed in seated hamstring stretch R/L 2 trials each to tolerance, pt c/o increased pain/tightness R > L LE. Instructed in [name] HEP for fall prevention and strengthening using RW for UE support in standing: marching x 20, heel raises x 20, knee flexion x 10 each LE, squats x 12, hip abduction x 12 each LE. Patient required intermittent rest breaks throughout session due to fatigue. Patient ambulated back to room and left sitting EOB with needs in reach to await OT session.   Therapy Documentation Precautions:  Precautions Precautions: Fall Restrictions Weight Bearing Restrictions: No Pain: Pain Assessment Pain Assessment: No/denies pain Pain Score: 0-No pain  See Function Navigator for Current Functional Status.   Therapy/Group: Individual Therapy  Laretta Alstrom 02/05/2016, 8:43 AM

## 2016-02-05 NOTE — Discharge Summary (Signed)
Physician Discharge Summary  Patient ID: Raymond Swanson MRN: [id number] DOB/AGE: 1925-02-28 82 y.o.  Admit date: 01/28/2016 Discharge date: 02/07/2016  Discharge Diagnoses:  Principal Problem:   Traumatic subdural hematoma with loss of consciousness of 30 minutes or less ([place]) Active Problems:   Essential hypertension   Fracture of multiple ribs of left side   T8 vertebral fracture (HCC)   L1 vertebral fracture (HCC)   Traumatic intracranial subdural hematoma (HCC)   Discharged Condition: stable.   Significant Diagnostic Studies:   Labs:  Basic Metabolic Panel: BMP Latest Ref Rng 02/07/2016 02/04/2016 01/28/2016  Glucose 65 - 99 mg/dL 93 102(H) 134(H)  BUN 6 - 20 mg/dL 15 23(H) 22(H)  Creatinine 0.61 - 1.24 mg/dL 0.91 1.14 1.05  Sodium 135 - 145 mmol/L 141 140 138  Potassium 3.5 - 5.1 mmol/L 4.2 4.5 3.9  Chloride 101 - 111 mmol/L 108 106 102  CO2 22 - 32 mmol/L 25 29 27   Calcium 8.9 - 10.3 mg/dL 9.8 9.2 8.9     CBC: CBC Latest Ref Rng 02/07/2016 01/28/2016 01/23/2016  WBC 4.0 - 10.5 K/uL 8.9 9.2 11.8(H)  Hemoglobin 13.0 - 17.0 g/dL 11.9(L) 11.1(L) 11.9(L)  Hematocrit 39.0 - 52.0 % 37.2(L) 34.9(L) 37.3(L)  Platelets 150 - 400 K/uL 425(H) 196 149(L)     CBG: No results for input(s): GLUCAP in the last 168 hours.  Brief HPI:   Raymond Swanson is an 82 year old gentleman with history of CAD, macular degeneration, prostate cancer who was found down after a fall from ladder with amnesia of events. CT head/neck reavealed moderate left parietal scalp contusion, acute R-frontotemporal SDH, acute hemorrhagic cortical and SAH bilateral frontal lobes and moderate degenerative changes cervical spine. Also found to have left 6th- 11th rib fractures, new small air-filled collections LUL likely traumatic pneumoceles, mesenteric edema, L-4 transverse process fracture, compression fractures T8 (age indeterminate) and L1. Dr. Saintclair Halsted evaluated patient and recommended serial CT head for monitoring and  no surgical intervention needed.  Mental status has been stable but patient with resultant left chest pain hypoxia and  tachycardia with limited.  Therapy ongoing and CIR recommended for follow up therapy.    Hospital Course: Raymond Swanson was admitted to rehab 01/28/2016 for inpatient therapies to consist of PT, ST and OT at least three hours five days a week. Past admission physiatrist, therapy team and rehab RN have worked together to provide customized collaborative inpatient rehab.  Blood pressures have been stable and he has been weaned off oxygen.  Po intake was noted to be poor and megace was added to help stimulate appetite. Lytes have been monitored showing evidence of dehydration. Nutritional supplements were added tid between meals and he has been encouraged to increase po fluid intake.  Low dose oxycodone was scheduled before therapy sessions to help with tolerance of activity.  Lidocaine patches were added to help manage rib pain and have been effective.  He has made steady progress and is currently at supervision level. Pain control has greatly improved and he is currently using tylenol on prn basis. He will continue to receive follow up HHPT and [place] [facility] at home after discharge.    Rehab course: During patient's stay in rehab weekly team conferences were held to monitor patient's progress, set goals and discuss barriers to discharge. At admission, patient required min to moderate assistance with mobility and min assist with basic self care tasks. He was able to complete tasks with written instructions and MOCA score of  26/30 was considered to be WNL therefore no speech therapy needed.  He  has had improvement in activity tolerance, balance, postural control, as well as ability to compensate for deficits. He is able to complete ADL tasks with min assist to supervision and requires extra time to complete the task. He is ambulating 150'X 2 with RW and supervision.  Family education was done  with wife regarding safety and all aspects of care.    Disposition: 01-Home or Self Care  Diet: Regular.   Special Instructions: 1. No Driving.       Discharge Instructions    Ambulatory referral to Physical Medicine Rehab    Complete by:  As directed   1-2 weeks            Medication List    STOP taking these medications        diphenhydramine-acetaminophen 25-500 MG Tabs tablet  Commonly known as:  TYLENOL PM     OMEGA-3 FISH OIL PO      TAKE these medications        acetaminophen 325 MG tablet  Commonly known as:  TYLENOL  Take 1 tablet (325 mg total) by mouth every 6 (six) hours as needed.     calcium gluconate 500 MG tablet  Take 500 mg by mouth daily.     cholecalciferol 1000 units tablet  Commonly known as:  VITAMIN D  Take 1,000 Units by mouth daily.     docusate sodium 100 MG capsule  Commonly known as:  COLACE  Take 1 capsule (100 mg total) by mouth 2 (two) times daily.  Notes to Patient:  Stool softener for constipation     famotidine 20 MG tablet  Commonly known as:  PEPCID  Take 1 tablet (20 mg total) by mouth 2 (two) times daily.  Notes to Patient:  For reflux     fluorouracil 5 % cream  Commonly known as:  EFUDEX  Apply topically 2 (two) times daily.     levothyroxine 50 MCG tablet  Commonly known as:  SYNTHROID, LEVOTHROID  Take 1 tablet (50 mcg total) by mouth daily.     lidocaine 5 %  Commonly known as:  LIDODERM  Apply to ribs at 7 am and remove at 7 pm daily.  Notes to Patient:  NOTE: Insurance will likely not cover these patches.  If not covered get over the counter patches     Melatonin 5 MG Tabs  Take 2.5 mg by mouth at bedtime.     nitroGLYCERIN 0.4 MG SL tablet  Commonly known as:  NITROSTAT  Place 1 tablet (0.4 mg total) under the tongue every 5 (five) minutes as needed.     polyethylene glycol packet  Commonly known as:  MIRALAX / GLYCOLAX  Take 17 g by mouth 2 (two) times daily.  Notes to Patient:  For  constipation     PRESERVISION/LUTEIN PO  Take by mouth. Daily     simvastatin 20 MG tablet  Commonly known as:  ZOCOR  Take 1 tablet (20 mg total) by mouth daily.     traZODone 50 MG tablet  Commonly known as:  DESYREL  Take 0.5 tablets (25 mg total) by mouth at bedtime as needed for sleep.       Follow-up Information    Follow up with [name], MD.   Specialty:  Physical Medicine and Rehabilitation   Why:  office will call you with follow up appointment   Contact information:   [address], [address] [place] [address] [phone]       Follow up with Elaina Hoops, MD. Call today.   Specialty:  Neurosurgery   Why:  for follow up appointment in 2-3 weeks.    Contact information:   [address] [place] [address] [place] [address] [phone]       Follow up with Nyoka Cowden, MD On 02/17/2016.   Specialty:  Internal Medicine   Why:  @ 2:45 pm (hospital follow up visit)   Contact information:   [place] [address] [phone]       Signed: Bary Leriche 02/07/2016, 1:48 PM

## 2016-02-05 NOTE — Patient Care Conference (Signed)
Inpatient RehabilitationTeam Conference and Plan of Care Update Date: 02/04/2016   Time: 2:45 PM    Patient Name: Raymond Swanson      Medical Record Number: [id number]  Date of Birth: December 13, 1924 Sex: Male         Room/Bed: 4W19C/4W19C-01 Payor Info: Payor: [place] / Plan: BCBS MEDICARE / Product Type: *No Product type* /    Admitting Diagnosis: Trauma TBI  Admit Date/Time:  01/28/2016  5:25 PM Admission Comments: No comment available   Primary Diagnosis:  Traumatic subdural hematoma with loss of consciousness of 30 minutes or less (HCC) Principal Problem: Traumatic subdural hematoma with loss of consciousness of 30 minutes or less [facility])  Patient Active Problem List   Diagnosis Date Noted  . Traumatic intracranial subdural hematoma (HCC) 01/28/2016  . Fall 01/27/2016  . Fracture of multiple ribs of left side 01/27/2016  . T8 vertebral fracture ([place]) 01/27/2016  . L1 vertebral fracture ([place]) 01/27/2016  . Lumbar transverse process fracture ([place]) 01/27/2016  . Pressure ulcer 01/26/2016  . Traumatic subdural hematoma with loss of consciousness of 30 minutes or less ([place]) 01/22/2016  . ANEMIA 10/08/2009  . ACUT GASTR ULCER W/HEMORR W/O MENTION OBST 09/10/2009  . MELENA 08/28/2009  . Hypothyroidism 10/18/2007  . Dyslipidemia 01/26/2007  . Essential hypertension 01/26/2007  . Coronary atherosclerosis 01/26/2007  . PROSTATE CANCER, HX OF 01/26/2007  . SKIN CANCER, HX OF 01/26/2007    Expected Discharge Date: Expected Discharge Date: 02/07/16  Team Members Present: Physician leading conference: Dr. Alger Simons Social Worker Present: Lennart Pall, LCSW Nurse Present: Heather Roberts, RN PT Present: [name], PT OT Present: Roanna Epley, [place], OT SLP Present: Weston Anna, SLP PPS Coordinator present : Daiva Nakayama, RN, CRRN     Current Status/Progress Goal Weekly Team Focus  Medical   tbi/polytrauma after fall cleaning gutters. pain and  coping issues prominent. a little depressed. eating better  improve po intake and pain control  see above.    Bowel/Bladder   Continent bowel and bladder; LBM 6/13  Supervision  Know s/s of constipation   Swallow/Nutrition/ Hydration             ADL's   BADLs-steady A; toilet transfers-steady A; shower treansfers-steady A; decreased activity tolerance  overall superivison  activity tolerance, functional transfers, functional amb with RW, standing balance, safety awareness, family education   Mobility   supervision, decreased activity tolerance but improving  supervision  functional mobility training, standing balance, activity tolerance, pt education   Communication             Safety/Cognition/ Behavioral Observations            Pain   Scheduled pain medications, Minimal c/o pain  <3 on a 0-10 scale  assess pain q 4-6 hrs and prn, educate patient to call RN when in need of pain meds   Skin   Bruising to Right side of body and lower back, multiple abrasions from fall, skin tear to right elbow from fall with foam dressing in place  no new skin breakdown while on rehab  assess q shift and prn    Rehab Goals Patient on target to meet rehab goals: Yes *See Care Plan and progress notes for long and short-term goals.  Barriers to Discharge: age, multiple ortho isses. wife cannot provide physical support    Possible Resolutions to Barriers:  supervision goals, increase exercise tolerance and improve pain control    Discharge Planning/Teaching Needs:  Plan to d/c home with elderly wife who can provide supervision.  Teaching to be scheduled closert to d/c.   Team Discussion:  Some depression about being here still.  Poor fluid intake.  Doing well with PT and reaching supervision level goals.  Poor endurance still and pt worried about his placing burden on his wife.    Revisions to Treatment Plan:  None   Continued Need for Acute Rehabilitation Level of Care: The patient requires  daily medical management by a physician with specialized training in physical medicine and rehabilitation for the following conditions: Daily direction of a multidisciplinary physical rehabilitation program to ensure safe treatment while eliciting the highest outcome that is of practical value to the patient.: Yes Daily medical management of patient stability for increased activity during participation in an intensive rehabilitation regime.: Yes Daily analysis of laboratory values and/or radiology reports with any subsequent need for medication adjustment of medical intervention for : Neurological problems;Post surgical problems;Nutritional problems  Helios Kohlmann 02/05/2016, 4:01 PM

## 2016-02-05 NOTE — Progress Notes (Signed)
Occupational Therapy Session Note  Patient Details  Name: Raymond Swanson MRN: [id number] Date of Birth: 08/06/1925  Today's Date: 02/05/2016 OT Individual Time: [id number] OT Individual Time Calculation (min): 45 min    Short Term Goals: Week 1:  OT Short Term Goal 1 (Week 1): Pt will be able to ambulate to toilet with close S using LRD. OT Short Term Goal 2 (Week 1): Pt will be able to don socks and shoes with min A. OT Short Term Goal 3 (Week 1): Pt will be able to reach into kitchen cupboards without LOB. OT Short Term Goal 4 (Week 1): Pt will demonstrate improved dynamic standing balance with steadying A during functional activities.  Skilled Therapeutic Interventions/Progress Updates:    Pt resting in bed upon arrival.  Pt engaged in BADL retraining to include dressing with sit<>stand from w/c at sink.  Pt declined bathing this morning.  Pt completed all dressing tasks as supervision level with more than a reasonable amount of time and multiple rest breaks.  Pt requested to lay back in bed near the end of the session.  Pt had just returned from PT therapy and stated he was still fatigued from session.  Pt remained in bed with all needs within reach.   Therapy Documentation Precautions:  Precautions Precautions: Fall Restrictions Weight Bearing Restrictions: No General: General OT Amount of Missed Time: 15 Minutes  Pain: Pain Assessment Pain Assessment: No/denies pain Pain Score: 0-No pain  See Function Navigator for Current Functional Status.   Therapy/Group: Individual Therapy  Leroy Libman 02/05/2016, 10:02 AM

## 2016-02-06 ENCOUNTER — Inpatient Hospital Stay (HOSPITAL_COMMUNITY): Payer: Medicare Other | Admitting: Physical Therapy

## 2016-02-06 ENCOUNTER — Inpatient Hospital Stay (HOSPITAL_COMMUNITY): Payer: Medicare Other

## 2016-02-06 ENCOUNTER — Inpatient Hospital Stay (HOSPITAL_COMMUNITY): Payer: Medicare Other | Admitting: Occupational Therapy

## 2016-02-06 MED ORDER — ACETAMINOPHEN 325 MG PO TABS: 325.0000 mg | ORAL_TABLET | Freq: Four times a day (QID) | ORAL | Status: DC | PRN

## 2016-02-06 NOTE — Progress Notes (Signed)
Raymond Swanson PHYSICAL MEDICINE & REHABILITATION     PROGRESS NOTE    Subjective/Complaints: Occasionally experiencing fatigue particularly in the afternoon. Pain better   ROS: Pt denies fever, rash/itching, headache, blurred or double vision, nausea, vomiting, abdominal pain, diarrhea,   palpitations, dysuria, dizziness, neck or back pain, bleeding, anxiety, or depression   Objective: Vital Signs: Blood pressure 141/49, pulse 106, temperature 97.7 F (36.5 C), temperature source Oral, resp. rate 18, height 5\' 8"  (1.727 m), weight 62 kg (136 lb 11 oz), SpO2 100 %. No results found. No results for input(s): WBC, HGB, HCT, PLT in the last 72 hours.  Recent Labs  02/04/16 0439  NA 140  K 4.5  CL 106  GLUCOSE 102*  BUN 23*  CREATININE 1.14  CALCIUM 9.2   CBG (last 3)  No results for input(s): GLUCAP in the last 72 hours.  Wt Readings from Last 3 Encounters:  02/06/16 62 kg (136 lb 11 oz)  01/22/16 62.4 kg (137 lb 9.1 oz)  12/31/15 63.504 kg (140 lb)    Physical Exam:  Constitutional: He is oriented to person, place, and time. He appears well-developed and well-nourished.  Thin elderly male  HENT:  Head: Normocephalic and atraumatic.  Right Ear: External ear normal.       Eyes: Conjunctivae are normal. Pupils are equal, round, and reactive to light.  Neck: Normal range of motion. Neck supple. No tracheal deviation present. No thyromegaly present.  Cardiovascular: Normal rate and regular rhythm. Exam reveals no friction rub.  No murmur heard. Respiratory: Effort normal. He has no wheezes. He has no rales. He exhibits tenderness left chest    GI: Soft. Bowel sounds are normal. He exhibits no distension. There is no tenderness.  Musculoskeletal: He exhibits no edema or tenderness.  Neurological: He is alert and oriented to person, place, and time.  HOH. Speech clear.  hearing deficits.  Reasonable insight and awareness.  RUE 4/5 prox to distal. LUE 3-4/5 and limited  due to pain in chest. LE's 4/5 HF,KE and 4+ ADF/PF. No gross sensory abnl.  Skin: Skin is warm and dry.  Psychiatric: He has a normal mood and affect. His speech is normal and behavior is normal.   Assessment/Plan: 1. Functional and cognitive deficits secondary to TBI with polytrauma which require 3+ hours per day of interdisciplinary therapy in a comprehensive inpatient rehab setting. Physiatrist is providing close team supervision and 24 hour management of active medical problems listed below. Physiatrist and rehab team continue to assess barriers to discharge/monitor patient progress toward functional and medical goals.  Function:  Bathing Bathing position Bathing activity did not occur: Refused Position: Shower  Bathing parts Body parts bathed by patient: Right arm, Left arm, Abdomen, Chest, Front perineal area, Buttocks, Right upper leg, Left upper leg, Right lower leg, Left lower leg Body parts bathed by helper: Right lower leg, Back  Bathing assist Assist Level: Touching or steadying assistance(Pt > 75%)      Upper Body Dressing/Undressing Upper body dressing   What is the patient wearing?: Button up shirt         Button up shirt - Perfomed by patient: Thread/unthread right sleeve, Thread/unthread left sleeve, Pull shirt around back, Button/unbutton shirt      Upper body assist Assist Level: Set up, Supervision or verbal cues      Lower Body Dressing/Undressing Lower body dressing   What is the patient wearing?: Pants, Shoes, Underwear, Socks Underwear - Performed by patient: Thread/unthread right underwear leg,  Pull underwear up/down, Thread/unthread left underwear leg Underwear - Performed by helper: Thread/unthread left underwear leg Pants- Performed by patient: Thread/unthread right pants leg, Thread/unthread left pants leg, Fasten/unfasten pants, Pull pants up/down Pants- Performed by helper: Thread/unthread right pants leg, Thread/unthread left pants leg, Pull  pants up/down, Fasten/unfasten pants     Socks - Performed by patient: Don/doff right sock, Don/doff left sock Socks - Performed by helper: Don/doff right sock, Don/doff left sock Shoes - Performed by patient: Don/doff right shoe, Don/doff left shoe, Fasten right, Fasten left Shoes - Performed by helper: Don/doff right shoe, Don/doff left shoe, Fasten right, Fasten left          Lower body assist Assist for lower body dressing: Supervision or verbal cues      Toileting Toileting   Toileting steps completed by patient: Adjust clothing prior to toileting, Performs perineal hygiene, Adjust clothing after toileting Toileting steps completed by helper: Adjust clothing prior to toileting Toileting Assistive Devices: Grab bar or rail  Toileting assist Assist level: Supervision or verbal cues   Transfers Chair/bed transfer   Chair/bed transfer method: Ambulatory Chair/bed transfer assist level: Supervision or verbal cues Chair/bed transfer assistive device: Armrests, Medical sales representative     Max distance: 200 Assist level: Supervision or verbal cues   Wheelchair   Type: Manual Max wheelchair distance: 150 Assist Level: Supervision or verbal cues  Cognition Comprehension Comprehension assist level: Follows complex conversation/direction with extra time/assistive device  Expression Expression assist level: Expresses complex ideas: With extra time/assistive device  Social Interaction Social Interaction assist level: Interacts appropriately with others with medication or extra time (anti-anxiety, antidepressant).  Problem Solving Problem solving assist level: Solves complex 90% of the time/cues < 10% of the time  Memory Memory assist level: Recognizes or recalls 90% of the time/requires cueing < 10% of the time   Medical Problem List and Plan: 1. Functional, cognitive, mobility deficits secondary to TBI/polytrauma (including L1, T8 fx's) after  fall -continue CIR therapies. Improving mobility and pain  -fatigues easily--will need to avoid excessive work in the afternoons 2. DVT Prophylaxis/Anticoagulation: Mechanical: Sequential compression devices, below knee Bilateral lower extremities 3. Pain Management: Continue ultram and tylenol qid--  dc scheduled oxycodone prior to therapy sessions-- 4. Mood: provided encouragement. ?neuropsych  - scheduled low dose trazodone for sleep  5. Neuropsych: This patient is capable of making decisions on his own behalf. 6. Skin/Wound Care: Monitor abrasion for healing.  7. Fluids/Electrolytes/Nutrition: Monitor I/O. Encourage PO fluids  -re-check labs tomorrow  -continue megace for appetite.   8. CAD: On Zocor. No ASA due to bleed.  9. Hypotension: encourage fluid intake. Monitor bid.  10. Anemia: last wk hgb 11.1---re-check tomorrow 11. Leucocytosis:resolved 12. Left rib fractures with Hypoxia: Encourage IS--oxygen prn with activity  -pain improving  -lidoderm patches to rib cage  -kpad 13. Constipaton: bm today   LOS (Days) 9 A FACE TO FACE EVALUATION WAS PERFORMED  SWARTZ,ZACHARY T 02/06/2016 11:03 AM

## 2016-02-06 NOTE — Progress Notes (Signed)
Occupational Therapy Session Note  Patient Details  Name: Raymond Swanson MRN: [id number] Date of Birth: 01-01-1925  Today's Date: 02/06/2016 OT Individual Time: [id number] OT Individual Time Calculation (min): 55 min    Short Term Goals: Week 1:  OT Short Term Goal 1 (Week 1): Pt will be able to ambulate to toilet with close S using LRD. OT Short Term Goal 2 (Week 1): Pt will be able to don socks and shoes with min A. OT Short Term Goal 3 (Week 1): Pt will be able to reach into kitchen cupboards without LOB. OT Short Term Goal 4 (Week 1): Pt will demonstrate improved dynamic standing balance with steadying A during functional activities.  Skilled Therapeutic Interventions/Progress Updates:    Pt resting in bed upon arrival.  Pt required min encouragement to participate in BADLs with assurance he could return to bed upon completion.  Pt engaged in BADL retraining (bathing, dressing, and toileting) with focus on increased activity tolerance, standing balance, functional amb with RW, functional transfers, and energy conservation.  Pt requires more than a reasonable amount of time to complete tasks with multiple rest breaks.  Pt returned to bed upon completion and remained in bed with all needs within reach.   Therapy Documentation Precautions:  Precautions Precautions: Fall Restrictions Weight Bearing Restrictions: No  Pain: Pain Assessment Pain Assessment: No/denies pain Pain Score: 1   RN aware  See Function Navigator for Current Functional Status.   Therapy/Group: Individual Therapy  Leroy Libman 02/06/2016, 10:07 AM

## 2016-02-06 NOTE — Progress Notes (Signed)
Occupational Therapy Session Note  Patient Details  Name: Raymond Swanson MRN: [id number] Date of Birth: 1924/10/14  Today's Date: 02/06/2016 OT Individual Time: [id number] OT Individual Time Calculation (min): 30 min   Short Term Goals: Week 1:  OT Short Term Goal 1 (Week 1): Pt will be able to ambulate to toilet with close S using LRD. OT Short Term Goal 2 (Week 1): Pt will be able to don socks and shoes with min A. OT Short Term Goal 3 (Week 1): Pt will be able to reach into kitchen cupboards without LOB. OT Short Term Goal 4 (Week 1): Pt will demonstrate improved dynamic standing balance with steadying A during functional activities.  Skilled Therapeutic Interventions/Progress Updates:  Patient found seated EOB, trying to get up to go use the BR. Pt with just plain socks on feet, therapist encouraged pt to don shoes before getting up and to be sure not to get up without staff present for his safety. From here, pt donned bilateral shoes with min assist from therapist, stood with RW and ambulated into BR. Pt stood at toilet to urinate. Pt then ambulated back to room and stood at sink to wash hands. Pt took a couple minute break, then engaged in BUE theraband functional strengthening exercises; shoulder horizontal ab/adduction, shoulder ab/adduction, shoulder flexion, elbow flexion/extension - completed all exercises 2 sets of 10. At end of session, assisted pt back to bed with bed alarm set and left all needs within reach.   Therapy Documentation Precautions:  Precautions Precautions: Fall Restrictions Weight Bearing Restrictions: No  Vital Signs: Therapy Vitals Temp: 97.7 F (36.5 C) Temp Source: Oral Pulse Rate: (!) 106 Resp: 18 BP: (!) 141/49 mmHg Patient Position (if appropriate): Lying Oxygen Therapy SpO2: 100 % O2 Device: Not Delivered  See Function Navigator for Current Functional Status.  Therapy/Group: Individual Therapy  Chrys Racer , MS, OTR/L, CLT  02/06/2016,  11:37 AM

## 2016-02-06 NOTE — Progress Notes (Signed)
Social Work Patient ID: Raymond Swanson, male   DOB: 09-19-1924, 82 y.o.   MRN: [id number]   Met with pt and wife yesterday afternoon to review team conference.  They both feel they will be ready for d/c tomorrow and wife reports she feels comfortable providing 2/47 supervision.  Have arranged [place] follow up.    Shawnte Winton, LCSW

## 2016-02-06 NOTE — Progress Notes (Signed)
Occupational Therapy Discharge Summary  Patient Details  Name: Raymond Swanson MRN: [id number] Date of Birth: 01/15/1925  Patient has met 7 of 7 long term goals due to improved activity tolerance, improved balance, postural control, ability to compensate for deficits and improved coordination.  Pt made steady progress with BADLs during this admission.  Pt completes all tasks at supervision level requiring more than a reasonable amount of time with multiple rest breaks.  Pt continues to fatigue quickly.  Educated patient on energy conservation strategies. Patient to discharge at overall Supervision level.  Patient's care partner is independent to provide the necessary physical assistance at discharge.    Recommendation:  Patient will benefit from ongoing skilled OT services in home health setting to continue to advance functional skills in the area of BADL and Reduce care partner burden.  Equipment: No equipment providedPt owns necessary equipment  Reasons for discharge: treatment goals met and discharge from hospital  Patient/family agrees with progress made and goals achieved: Yes  OT Discharge ADL ADL Comments: steady A overall with BADLs Vision/Perception  Vision- History Patient Visual Report: No change from baseline Vision- Assessment Vision Assessment?: No apparent visual deficits Perception Comments: WFL  Cognition Overall Cognitive Status: Within Functional Limits for tasks assessed Arousal/Alertness: Awake/alert Orientation Level: Oriented X4 Attention: Sustained Memory: Appears intact Awareness: Appears intact Problem Solving: Appears intact Safety/Judgment: Appears intact [employer] Scales of Cognitive Functioning: Purposeful/appropriate Sensation Sensation Light Touch: Appears Intact Stereognosis: Appears Intact Hot/Cold: Appears Intact Proprioception: Appears Intact Motor  Motor Motor: Within Functional Limits    Trunk/Postural Assessment  Cervical  Assessment Cervical Assessment: Within Functional Limits Thoracic Assessment Thoracic Assessment: Exceptions to [facility] (forward flexed) Lumbar Assessment Lumbar Assessment: Exceptions to [facility] (posterior pelvic tilt) Postural Control Postural Control: Deficits on evaluation Protective Responses: impaired/delayed  Balance Balance Balance Assessed: Yes Standardized Balance Assessment Standardized Balance Assessment: Berg Balance Test Berg Balance Test Sit to Stand: Able to stand  independently using hands Standing Unsupported: Able to stand 2 minutes with supervision Sitting with Back Unsupported but Feet Supported on Floor or Stool: Able to sit safely and securely 2 minutes Stand to Sit: Controls descent by using hands Transfers: Able to transfer with verbal cueing and /or supervision Standing Unsupported with Eyes Closed: Able to stand 10 seconds with supervision Standing Ubsupported with Feet Together: Needs help to attain position but able to stand for 30 seconds with feet together From Standing, Reach Forward with Outstretched Arm: Reaches forward but needs supervision From Standing Position, Pick up Object from Floor: Unable to try/needs assist to keep balance From Standing Position, Turn to Look Behind Over each Shoulder: Needs supervision when turning Turn 360 Degrees: Needs close supervision or verbal cueing Standing Unsupported, Alternately Place Feet on Step/Stool: Needs assistance to keep from falling or unable to try Standing Unsupported, One Foot in Front: Loses balance while stepping or standing Standing on One Leg: Unable to try or needs assist to prevent fall Total Score: 22 Dynamic Sitting Balance Dynamic Sitting - Level of Assistance: 6: Modified independent (Device/Increase time) Static Standing Balance Static Standing - Balance Support: During functional activity;Bilateral upper extremity supported Static Standing - Level of Assistance: 5: Stand by assistance Dynamic  Standing Balance Dynamic Standing - Balance Support: During functional activity;Left upper extremity supported Dynamic Standing - Level of Assistance: 5: Stand by assistance Extremity/Trunk Assessment RUE Assessment RUE Assessment: Within Functional Limits LUE Assessment LUE Assessment: Within Functional Limits   See Function Navigator for Current Functional Status.  Madilyn Fireman,  Anselmo Rod 02/06/2016, 3:01 PM

## 2016-02-06 NOTE — Progress Notes (Signed)
Physical Therapy Discharge Summary  Patient Details  Name: Raymond Swanson MRN: [id number] Date of Birth: 01-14-1925  Today's Date: 02/06/2016 PT Individual Time: 0905-1000 and 1330-1430 PT Individual Time Calculation (min): 55 min and 60 min   Patient has met 9 of 9 long term goals due to improved activity tolerance, improved balance, improved postural control, increased strength, decreased pain, ability to compensate for deficits, functional use of  right upper extremity, right lower extremity, left upper extremity and left lower extremity and improved coordination.  Patient to discharge at an ambulatory level Supervision.   Patient's care partner is independent to provide the necessary physical assistance at discharge.  Reasons goals not met: NA  Recommendation:  Patient will benefit from ongoing skilled PT services in home health setting to continue to advance safe functional mobility, address ongoing impairments in strength, standing balance, activity tolerance, and minimize fall risk.  Equipment: No equipment provided  Reasons for discharge: treatment goals met and discharge from hospital  Patient/family agrees with progress made and goals achieved: Yes  Skilled Therapeutic Intervention Treatment 1: Patient requires supervision overall using RW for functional ambulation, transfers, stairs, and simulated car transfer to sedan height and mod I for bed mobility in ADL apartment on regular bed. Patient picked up item from floor with min A. Instructed in 5TSS from arm chair with UE support using RW = 32 sec. Rest breaks provided throughout session as needed due to fatigue. Patient with no questions/concerns regarding discharge. Patient left semi reclined in bed with all needs in reach.  Treatment 2: Patient resting in bed upon arrival. Donned shoes seated EOB, ambulated to/from bathroom using RW, and performed toileting tasks using RW or grab bar and performed hand hygiene at sink with  supervision. Gait training using RW 2 x 200 ft with supervision. 10 MWT = 0.42 m/s. Patient demonstrates high fall risk as noted by score of  22/56 on Berg Balance Scale, but demonstrates 10-point improvement from score of 12/56 on 02/04/16. Performed NuStep using BUE/BLE at level 5 x 8 min for strengthening and endurance. Patient requires rest breaks throughout session due to fatigue. Patient ambulated back to room and left semi reclined in bed with all needs in reach.   PT Discharge Precautions/Restrictions Precautions Precautions: Fall Restrictions Weight Bearing Restrictions: No Pain Pain Assessment Pain Assessment: No/denies pain Vision/Perception   No change from baseline  Cognition Overall Cognitive Status: Within Functional Limits for tasks assessed Arousal/Alertness: Awake/alert Orientation Level: Oriented X4 Sensation Sensation Light Touch: Appears Intact Stereognosis: Appears Intact Hot/Cold: Appears Intact Proprioception: Appears Intact Coordination Gross Motor Movements are Fluid and Coordinated: No Fine Motor Movements are Fluid and Coordinated: Yes Motor  Motor Motor: Within Functional Limits  Mobility Bed Mobility Bed Mobility: Rolling Right;Rolling Left;Sit to Supine;Supine to Sit Rolling Right: 6: Modified independent (Device/Increase time) Rolling Left: 6: Modified independent (Device/Increase time) Supine to Sit: 6: Modified independent (Device/Increase time) Sit to Supine: 6: Modified independent (Device/Increase time) Transfers Sit to Stand: 5: Supervision;With upper extremity assist Stand to Sit: 5: Supervision;With upper extremity assist Locomotion  Ambulation Ambulation: Yes Ambulation/Gait Assistance: 5: Supervision Ambulation Distance (Feet): 200 Feet Assistive device: Rolling walker Gait Gait: Yes Gait Pattern: Impaired Gait Pattern: Step-through pattern;Decreased stride length;Trunk flexed;Decreased trunk rotation Gait velocity: 10 MWT =  0.42 m/s Stairs / Additional Locomotion Stairs: Yes Stairs Assistance: 5: Supervision Stair Management Technique: Two rails;Step to pattern;Forwards Number of Stairs: 12 Height of Stairs: 3 (8 3", 4 6") Ramp: 5: Firefighter  Mobility: No  Trunk/Postural Assessment  Cervical Assessment Cervical Assessment: Within Functional Limits Thoracic Assessment Thoracic Assessment: Exceptions to [facility] (forward flexed) Lumbar Assessment Lumbar Assessment: Exceptions to [facility] (posterior pelvic tilt) Postural Control Postural Control: Deficits on evaluation Protective Responses: impaired/delayed  Balance Balance Balance Assessed: Yes Standardized Balance Assessment Standardized Balance Assessment: Berg Balance Test Berg Balance Test Sit to Stand: Able to stand  independently using hands Standing Unsupported: Able to stand 2 minutes with supervision Sitting with Back Unsupported but Feet Supported on Floor or Stool: Able to sit safely and securely 2 minutes Stand to Sit: Controls descent by using hands Transfers: Able to transfer with verbal cueing and /or supervision Standing Unsupported with Eyes Closed: Able to stand 10 seconds with supervision Standing Ubsupported with Feet Together: Needs help to attain position but able to stand for 30 seconds with feet together From Standing, Reach Forward with Outstretched Arm: Reaches forward but needs supervision From Standing Position, Pick up Object from Floor: Unable to try/needs assist to keep balance From Standing Position, Turn to Look Behind Over each Shoulder: Needs supervision when turning Turn 360 Degrees: Needs close supervision or verbal cueing Standing Unsupported, Alternately Place Feet on Step/Stool: Needs assistance to keep from falling or unable to try Standing Unsupported, One Foot in Front: Loses balance while stepping or standing Standing on One Leg: Unable to try or needs assist to prevent fall Total  Score: 22 Static Standing Balance Static Standing - Balance Support: During functional activity;Bilateral upper extremity supported Static Standing - Level of Assistance: 5: Stand by assistance Dynamic Standing Balance Dynamic Standing - Balance Support: During functional activity;Left upper extremity supported Dynamic Standing - Level of Assistance: 5: Stand by assistance  Five times Sit to Stand Test (FTSS) Method: Use a straight back chair with a solid seat that is 16-18" high. Ask participant to sit on the chair with arms folded across their chest.   Instructions: "Stand up and sit down as quickly as possible 5 times, keeping your arms folded across your chest."   Measurement: Stop timing when the participant stands the 5th time.  TIME: __32 sec WITH BUE SUPPORT AND USE OF RW_ (in seconds)  Times > 13.6 seconds is associated with increased disability and morbidity (Guralnik, 2000) Times > 15 seconds is predictive of recurrent falls in healthy individuals aged 31 and older (Buatois, et al., 2008) Normal performance values in community dwelling individuals aged 6 and older (Bohannon, 2006): o 60-69 years: 11.4 seconds o 70-79 years: 12.6 seconds o 80-89 years: 14.8 seconds  MCID: ? 2.3 seconds for Vestibular Disorders (Meretta, 2006)  Extremity Assessment  RUE Assessment RUE Assessment: Within Functional Limits LUE Assessment LUE Assessment: Within Functional Limits RLE Assessment RLE Assessment: Within Functional Limits LLE Assessment LLE Assessment: Within Functional Limits   See Function Navigator for Current Functional Status.  [name] A 02/06/2016, 7:49 AM

## 2016-02-07 LAB — BASIC METABOLIC PANEL
ANION GAP: 8 (ref 5–15)
BUN: 15 mg/dL (ref 6–20)
CHLORIDE: 108 mmol/L (ref 101–111)
CO2: 25 mmol/L (ref 22–32)
Calcium: 9.8 mg/dL (ref 8.9–10.3)
Creatinine, Ser: 0.91 mg/dL (ref 0.61–1.24)
GFR calc non Af Amer: >60 mL/min (ref >60)
Glucose, Bld: 93 mg/dL (ref 65–99)
POTASSIUM: 4.2 mmol/L (ref 3.5–5.1)
Sodium: 141 mmol/L (ref 135–145)

## 2016-02-07 LAB — CBC
HEMATOCRIT: 37.2 % — AB (ref 39.0–52.0)
Hemoglobin: 11.9 g/dL — ABNORMAL LOW (ref 13.0–17.0)
MCH: 29.2 pg (ref 26.0–34.0)
MCHC: 32 g/dL (ref 30.0–36.0)
MCV: 91.2 fL (ref 78.0–100.0)
Platelets: 425 10*3/uL — ABNORMAL HIGH (ref 150–400)
RBC: 4.08 MIL/uL — AB (ref 4.22–5.81)
RDW: 14.8 % (ref 11.5–15.5)
WBC: 8.9 10*3/uL (ref 4.0–10.5)

## 2016-02-07 LAB — TSH: TSH: 8.226 u[IU]/mL — ABNORMAL HIGH (ref 0.350–4.500)

## 2016-02-07 MED ORDER — TRAZODONE HCL 50 MG PO TABS: 25.0000 mg | ORAL_TABLET | Freq: Every evening | ORAL | Status: DC | PRN

## 2016-02-07 MED ORDER — FAMOTIDINE 20 MG PO TABS: 20.0000 mg | ORAL_TABLET | Freq: Two times a day (BID) | ORAL | Status: DC

## 2016-02-07 MED ORDER — POLYETHYLENE GLYCOL 3350 17 G PO PACK: 17.0000 g | PACK | Freq: Two times a day (BID) | ORAL | Status: DC

## 2016-02-07 MED ORDER — DOCUSATE SODIUM 100 MG PO CAPS: 100.0000 mg | ORAL_CAPSULE | Freq: Two times a day (BID) | ORAL | Status: DC

## 2016-02-07 MED ORDER — LIDOCAINE 5 % EX PTCH: MEDICATED_PATCH | CUTANEOUS | Status: DC

## 2016-02-07 NOTE — Progress Notes (Signed)
[facility] PHYSICAL MEDICINE & REHABILITATION     PROGRESS NOTE    Subjective/Complaints: Feeling well. A little apprehensive about going home as he doesn't want to burden his wife.  ROS: Pt denies fever, rash/itching, headache, blurred or double vision, nausea, vomiting, abdominal pain, diarrhea,   palpitations, dysuria, dizziness, neck or back pain, bleeding, anxiety, or depression   Objective: Vital Signs: Blood pressure 149/62, pulse 83, temperature 97.9 F (36.6 C), temperature source Oral, resp. rate 18, height 5' 8"  (1.727 m), weight 62 kg (136 lb 11 oz), SpO2 100 %. No results found.  Recent Labs  02/07/16 0638  WBC 8.9  HGB 11.9*  HCT 37.2*  PLT 425*    Recent Labs  02/07/16 0638  NA 141  K 4.2  CL 108  GLUCOSE 93  BUN 15  CREATININE 0.91  CALCIUM 9.8   CBG (last 3)  No results for input(s): GLUCAP in the last 72 hours.  Wt Readings from Last 3 Encounters:  02/06/16 62 kg (136 lb 11 oz)  01/22/16 62.4 kg (137 lb 9.1 oz)  12/31/15 63.504 kg (140 lb)    Physical Exam:  Constitutional: He is oriented to person, place, and time. He appears well-developed and well-nourished.  Thin elderly male  HENT:  Head: Normocephalic and atraumatic.  Right Ear: External ear normal.       Eyes: Conjunctivae are normal. Pupils are equal, round, and reactive to light.  Neck: Normal range of motion. Neck supple. No tracheal deviation present. No thyromegaly present.  Cardiovascular: Normal rate and regular rhythm. Exam reveals no friction rub.  No murmur heard. Respiratory: Effort normal. He has no wheezes. He has no rales. He exhibits tenderness left chest    GI: Soft. Bowel sounds are normal. He exhibits no distension. There is no tenderness.  Musculoskeletal: He exhibits no edema or tenderness.  Neurological: He is alert and oriented to person, place, and time.  HOH. Speech clear.  hearing deficits.  Reasonable insight and awareness.  RUE 4/5 prox to  distal. LUE 4/5  . LE's 4+ HF,KE and 4+ ADF/PF. No gross sensory abnl.  Skin: Skin is warm and dry.  Psychiatric: He has a normal mood and affect. His speech is normal and behavior is normal.   Assessment/Plan: 1. Functional and cognitive deficits secondary to TBI with polytrauma which require 3+ hours per day of interdisciplinary therapy in a comprehensive inpatient rehab setting. Physiatrist is providing close team supervision and 24 hour management of active medical problems listed below. Physiatrist and rehab team continue to assess barriers to discharge/monitor patient progress toward functional and medical goals.  Function:  Bathing Bathing position Bathing activity did not occur: Refused Position: Shower  Bathing parts Body parts bathed by patient: Right arm, Left arm, Abdomen, Chest, Front perineal area, Buttocks, Right upper leg, Left upper leg, Right lower leg, Left lower leg, Back Body parts bathed by helper: Right lower leg, Back  Bathing assist Assist Level: Supervision or verbal cues      Upper Body Dressing/Undressing Upper body dressing   What is the patient wearing?: Button up shirt         Button up shirt - Perfomed by patient: Thread/unthread right sleeve, Thread/unthread left sleeve, Pull shirt around back, Button/unbutton shirt      Upper body assist Assist Level: Set up, Supervision or verbal cues      Lower Body Dressing/Undressing Lower body dressing   What is the patient wearing?: Pants, Shoes, Underwear, Socks Underwear -  Performed by patient: Thread/unthread right underwear leg, Pull underwear up/down, Thread/unthread left underwear leg Underwear - Performed by helper: Thread/unthread left underwear leg Pants- Performed by patient: Thread/unthread right pants leg, Thread/unthread left pants leg, Fasten/unfasten pants, Pull pants up/down Pants- Performed by helper: Thread/unthread right pants leg, Thread/unthread left pants leg, Pull pants up/down,  Fasten/unfasten pants     Socks - Performed by patient: Don/doff right sock, Don/doff left sock Socks - Performed by helper: Don/doff right sock, Don/doff left sock Shoes - Performed by patient: Don/doff right shoe, Don/doff left shoe, Fasten right, Fasten left Shoes - Performed by helper: Don/doff right shoe, Don/doff left shoe, Fasten right, Fasten left          Lower body assist Assist for lower body dressing: Supervision or verbal cues      Toileting Toileting   Toileting steps completed by patient: Adjust clothing prior to toileting, Performs perineal hygiene, Adjust clothing after toileting Toileting steps completed by helper: Adjust clothing prior to toileting Toileting Assistive Devices: Grab bar or rail  Toileting assist Assist level: Supervision or verbal cues   Transfers Chair/bed transfer   Chair/bed transfer method: Ambulatory Chair/bed transfer assist level: Supervision or verbal cues Chair/bed transfer assistive device: Armrests, Medical sales representative     Max distance: 200 Assist level: Supervision or verbal cues   Wheelchair   Type: Manual Max wheelchair distance: 150 Assist Level: Supervision or verbal cues  Cognition Comprehension Comprehension assist level: Follows basic conversation/direction with extra time/assistive device  Expression Expression assist level: Expresses basic needs/ideas: With no assist  Social Interaction Social Interaction assist level: Interacts appropriately 90% of the time - Needs monitoring or encouragement for participation or interaction.  Problem Solving Problem solving assist level: Solves basic 75 - 89% of the time/requires cueing 10 - 24% of the time  Memory Memory assist level: Recognizes or recalls 90% of the time/requires cueing < 10% of the time   Medical Problem List and Plan: 1. Functional, cognitive, mobility deficits secondary to TBI/polytrauma (including L1, T8 fx's) after fall -continue  CIR therapies. Home today goals met  -Patient to see me in the office for transitional care encounter in 1-2 weeks. 2. DVT Prophylaxis/Anticoagulation: ambulation 3. Pain Management: Continue ultram and tylenol qid--   -change ultram to prn at home 4. Mood: provided encouragement. ?neuropsych  - scheduled low dose trazodone for sleep --can use PRN at home 5. Neuropsych: This patient is capable of making decisions on his own behalf. 6. Skin/Wound Care: Monitor abrasion for healing.  7. Fluids/Electrolytes/Nutrition: Monitor I/O. Encourage PO fluids  -I personally reviewed the patient's labs today. improved  -continue megace for appetite.   8. CAD: On Zocor. No ASA due to bleed.  9. Hypotension: encourage fluid intake. Monitor bid.  10. Anemia: last wk hgb 11.9 today 11. Leucocytosis:resolved 12. Left rib fractures with Hypoxia: Encourage IS--oxygen prn with activity  -pain improving  -lidoderm patches to rib cage  -kpad 13. Constipaton: moving bowels   LOS (Days) 10 A FACE TO FACE EVALUATION WAS PERFORMED  Lenae Wherley T 02/07/2016 9:39 AM

## 2016-02-07 NOTE — Progress Notes (Signed)
Pt given discharge instructions via Reesa Chew, PAC. Pt discharged to home with family.

## 2016-02-07 NOTE — Progress Notes (Signed)
Patient noted to have increased frequency of urination throughout the night with low volumes each time. Urine concentrated and strong smelling. No complaints of pain throughout shift. Able to stand bedside to use urinal with guard assist only. No acute distress.

## 2016-02-07 NOTE — Progress Notes (Signed)
Social Work  Discharge Note  The overall goal for the admission was met for:   Discharge location: Yes - home with wife who can provide supervision  Length of Stay: Yes - 10 days  Discharge activity level: Yes - supervision  Home/community participation: Yes  Services provided included: MD, RD, PT, OT, SLP, RN, TR, Pharmacy and [place]: [facility] Medicare  Follow-up services arranged: Home Health: PT, OT via [facility], DME: 3n1 commode via [place] and Patient/Family has no preference for HH/DME agencies  Comments (or additional information):  Patient/Family verbalized understanding of follow-up arrangements: Yes  Individual responsible for coordination of the follow-up plan: pt  Confirmed correct DME delivered: Gweneth Fredlund 02/07/2016    Spero Gunnels

## 2016-02-07 NOTE — Discharge Instructions (Signed)
Inpatient Rehab Discharge Instructions  WARING WOOLARD Discharge date and time:  02/07/16  Activities/Precautions/ Functional Status: Activity: no lifting, driving, or strenuous exercise for till cleared by MD. Diet: regular diet Wound Care: none needed   Functional status:  ___ No restrictions     ___ Walk up steps independently _X__ 24/7 supervision/assistance   ___ Walk up steps with assistance ___ Intermittent supervision/assistance  ___ Bathe/dress independently ___ Walk with walker     ___ Bathe/dress with assistance ___ Walk Independently    ___ Shower independently __X_ Walk with supervision.     _X__ Shower with assistance _X__ No alcohol     ___ Return to work/school ________   COMMUNITY REFERRALS UPON DISCHARGE:    Home Health:   PT     OT                     Agency:  [facility]    Phone: [phone]   Medical Equipment/Items Ordered: bedside commode                                                     Agency/Supplier:  [place] [phone]   Special Instructions:    My questions have been answered and I understand these instructions. I will adhere to these goals and the provided educational materials after my discharge from the hospital.  Patient/Caregiver Signature _______________________________ Date __________  Clinician Signature _______________________________________ Date __________  Please bring this form and your medication list with you to all your follow-up doctor's appointments.

## 2016-02-08 DIAGNOSIS — S2242XD Multiple fractures of ribs, left side, subsequent encounter for fracture with routine healing: Secondary | ICD-10-CM | POA: Diagnosis not present

## 2016-02-08 DIAGNOSIS — S32010D Wedge compression fracture of first lumbar vertebra, subsequent encounter for fracture with routine healing: Secondary | ICD-10-CM | POA: Diagnosis not present

## 2016-02-08 DIAGNOSIS — S22060D Wedge compression fracture of T7-T8 vertebra, subsequent encounter for fracture with routine healing: Secondary | ICD-10-CM | POA: Diagnosis not present

## 2016-02-08 DIAGNOSIS — S065X1D Traumatic subdural hemorrhage with loss of consciousness of 30 minutes or less, subsequent encounter: Secondary | ICD-10-CM | POA: Diagnosis not present

## 2016-02-08 DIAGNOSIS — I2510 Atherosclerotic heart disease of native coronary artery without angina pectoris: Secondary | ICD-10-CM | POA: Diagnosis not present

## 2016-02-08 DIAGNOSIS — S0219XD Other fracture of base of skull, subsequent encounter for fracture with routine healing: Secondary | ICD-10-CM | POA: Diagnosis not present

## 2016-02-10 ENCOUNTER — Telehealth: Payer: Self-pay | Admitting: Internal Medicine

## 2016-02-10 NOTE — Telephone Encounter (Signed)
Okay for physical therapy.

## 2016-02-10 NOTE — Telephone Encounter (Signed)
Discussed with Dr.K that orders for home health aid was needed also for pt. Okay per Dr.K.

## 2016-02-10 NOTE — Telephone Encounter (Signed)
Left detailed message on personal voicemail of Erin PT verbal orders given for Physical Therapy 3x's a week for 2 weeks followed by 2x's a week for 3 weeks, also home health aid for personal service 2x's a week for 2 weeks okay per Dr. Raliegh Ip. Any questions please call office.

## 2016-02-10 NOTE — Telephone Encounter (Signed)
Erin PT need verbal orders for Physical Therapy  3x a week for 2 weeks followed by 2x a week for 3 weeks  Need home health aid for personal service 2x a week for 2 weeks.   PT # [id number] to report verbal orders

## 2016-02-10 NOTE — Telephone Encounter (Signed)
Okay for PT for pt? 

## 2016-02-13 ENCOUNTER — Telehealth: Payer: Self-pay | Admitting: Internal Medicine

## 2016-02-13 NOTE — Telephone Encounter (Signed)
Spoke to pt's wife Sunday Spillers, told her orders were given a few days ago for PT and Home Health services. Told her someone should be in contact with you regarding services. Sunday Spillers verbalized understanding.

## 2016-02-13 NOTE — Telephone Encounter (Signed)
Pts wife calling to see if she can get home health services to help he out with pt.

## 2016-02-17 ENCOUNTER — Encounter: Payer: Self-pay | Admitting: Internal Medicine

## 2016-02-17 ENCOUNTER — Telehealth: Payer: Self-pay | Admitting: Physical Medicine & Rehabilitation

## 2016-02-17 ENCOUNTER — Ambulatory Visit (INDEPENDENT_AMBULATORY_CARE_PROVIDER_SITE_OTHER): Payer: Medicare Other | Admitting: Internal Medicine

## 2016-02-17 VITALS — BP 126/70 | HR 60 | Temp 97.5°F | Resp 18 | Ht 68.0 in | Wt 128.0 lb

## 2016-02-17 DIAGNOSIS — Z5189 Encounter for other specified aftercare: Secondary | ICD-10-CM

## 2016-02-17 DIAGNOSIS — W19XXXD Unspecified fall, subsequent encounter: Secondary | ICD-10-CM | POA: Diagnosis not present

## 2016-02-17 DIAGNOSIS — S065X1D Traumatic subdural hemorrhage with loss of consciousness of 30 minutes or less, subsequent encounter: Secondary | ICD-10-CM | POA: Diagnosis not present

## 2016-02-17 DIAGNOSIS — I10 Essential (primary) hypertension: Secondary | ICD-10-CM | POA: Diagnosis not present

## 2016-02-17 NOTE — Patient Instructions (Signed)
Continue home physical therapy  Call or return to clinic prn if these symptoms worsen or fail to improve as anticipated.

## 2016-02-17 NOTE — Progress Notes (Signed)
Pre visit review using our clinic review tool, if applicable. No additional management support is needed unless otherwise documented below in the visit note. 

## 2016-02-17 NOTE — Telephone Encounter (Signed)
Verbal orders for 4 weeks for 7 visits. Spoke with Shanon Brow and gave verbal ok.

## 2016-02-17 NOTE — Progress Notes (Signed)
Subjective:    Patient ID: Raymond Swanson, male    DOB: 15-Feb-1925, 82 y.o.   MRN: [id number]  HPI  Admit date: 01/28/2016 Discharge date: 02/07/2016  Discharge Diagnoses:  Principal Problem:  Traumatic subdural hematoma with loss of consciousness of 30 minutes or less ([place]) Active Problems:  Essential hypertension  Fracture of multiple ribs of left side  T8 vertebral fracture (HCC)  L1 vertebral fracture (HCC)  Traumatic intracranial subdural hematoma ([place])  82 year old patient status post traumatic SDH who was admitted for 7 days and readmitted for CIR for an additional 10 days.  He was discharged 10 days ago and continues to do well.  He receives home PT 3 times weekly and is inflammatory with a walker.  No further falls He sustained multiple trauma including traumatic subdural hematoma.  Multiple left-sided rib fractures as well as vertebral fractures.  He states that he has had very little discomfort after the initial 3 days.  Stable medical problems include essential hypertension, hypothyroidism.  His only complaint today is some insomnia  Past Medical History  Diagnosis Date  . Cancer ([place])   . ACUT GASTR ULCER W/HEMORR W/O MENTION OBST 09/10/2009  . ANEMIA 10/08/2009  . CORONARY ARTERY DISEASE 01/26/2007  . HYPERLIPIDEMIA 01/26/2007  . HYPERTENSION 01/26/2007  . HYPOTHYROIDISM 10/18/2007  . MELENA 08/28/2009  . PROSTATE CANCER, HX OF 01/26/2007  . SKIN CANCER, HX OF 01/26/2007     Social History   Social History  . Marital Status: Married    Spouse Name: N/A  . Number of Children: N/A  . Years of Education: N/A   Occupational History  . Not on file.   Social History Main Topics  . Smoking status: Former Smoker -- 1.00 packs/day for 20 years    Types: Cigarettes    Quit date: 10/28/1948  . Smokeless tobacco: Never Used  . Alcohol Use: Yes  . Drug Use: No  . Sexual Activity: Not on file   Other Topics Concern  . Not on file   Social History Narrative     Past Surgical History  Procedure Laterality Date  . Coronary artery bypass graft      1991  . Hernia repair      ingunial  . Prostate surgery      prostatectomy  . Mohs surgery    . Cataract extraction      Family History  Problem Relation Age of Onset  . Lung cancer Brother 56  . GI problems Sister     No Known Allergies  Current Outpatient Prescriptions on File Prior to Visit  Medication Sig Dispense Refill  . acetaminophen (TYLENOL) 325 MG tablet Take 1 tablet (325 mg total) by mouth every 6 (six) hours as needed.    . calcium gluconate 500 MG tablet Take 500 mg by mouth daily.      . cholecalciferol (VITAMIN D) 1000 UNITS tablet Take 1,000 Units by mouth daily.      Marland Kitchen docusate sodium (COLACE) 100 MG capsule Take 1 capsule (100 mg total) by mouth 2 (two) times daily. 60 capsule 0  . famotidine (PEPCID) 20 MG tablet Take 1 tablet (20 mg total) by mouth 2 (two) times daily. 60 tablet 0  . fluorouracil (EFUDEX) 5 % cream Apply topically 2 (two) times daily. 40 g 2  . levothyroxine (SYNTHROID, LEVOTHROID) 50 MCG tablet Take 1 tablet (50 mcg total) by mouth daily. 90 tablet 3  . lidocaine (LIDODERM) 5 % Apply to ribs at 7  am and remove at 7 pm daily. 60 patch 0  . Melatonin 5 MG TABS Take 2.5 mg by mouth at bedtime.    . Multiple Vitamins-Minerals (PRESERVISION/LUTEIN PO) Take by mouth. Daily       . nitroGLYCERIN (NITROSTAT) 0.4 MG SL tablet Place 1 tablet (0.4 mg total) under the tongue every 5 (five) minutes as needed. 20 tablet 5  . polyethylene glycol (MIRALAX / GLYCOLAX) packet Take 17 g by mouth 2 (two) times daily. 60 each 0  . simvastatin (ZOCOR) 20 MG tablet Take 1 tablet (20 mg total) by mouth daily. 90 tablet 3  . traZODone (DESYREL) 50 MG tablet Take 0.5 tablets (25 mg total) by mouth at bedtime as needed for sleep. 15 tablet 0   No current facility-administered medications on file prior to visit.    BP 126/70 mmHg  Pulse 60  Temp(Src) 97.5 F (36.4 C)  (Oral)  Resp 18  Ht 5\' 8"  (1.727 m)  Wt 128 lb (58.06 kg)  BMI 19.47 kg/m2  SpO2 96%     Review of Systems  Constitutional: Positive for activity change. Negative for fever, chills, appetite change and fatigue.  HENT: Negative for congestion, dental problem, ear pain, hearing loss, sore throat, tinnitus, trouble swallowing and voice change.   Eyes: Negative for pain, discharge and visual disturbance.  Respiratory: Negative for cough, chest tightness, wheezing and stridor.   Cardiovascular: Positive for chest pain. Negative for palpitations and leg swelling.  Gastrointestinal: Negative for nausea, vomiting, abdominal pain, diarrhea, constipation, blood in stool and abdominal distention.  Genitourinary: Negative for urgency, hematuria, flank pain, discharge, difficulty urinating and genital sores.  Musculoskeletal: Positive for gait problem. Negative for myalgias, back pain, joint swelling, arthralgias and neck stiffness.  Skin: Negative for rash.  Neurological: Negative for dizziness, syncope, speech difficulty, weakness, numbness and headaches.  Hematological: Negative for adenopathy. Does not bruise/bleed easily.  Psychiatric/Behavioral: Positive for sleep disturbance. Negative for behavioral problems and dysphoric mood. The patient is not nervous/anxious.        Objective:   Physical Exam  Constitutional: He is oriented to person, place, and time. He appears well-developed.  Elderly Frail No distress Alert Blood pressure 126/70  Ambulatory with a walker  HENT:  Head: Normocephalic.  Right Ear: External ear normal.  Left Ear: External ear normal.  Eyes: Conjunctivae and EOM are normal.  Neck: Normal range of motion.  Cardiovascular: Normal rate and normal heart sounds.   Pulmonary/Chest: Breath sounds normal.  Abdominal: Bowel sounds are normal.  Musculoskeletal: Normal range of motion. He exhibits edema. He exhibits no tenderness.  Neurological: He is alert and  oriented to person, place, and time.  Psychiatric: He has a normal mood and affect. His behavior is normal.          Assessment & Plan:    status post traumatic subdural hematoma  status post multitrauma with multiple left rib and vertebral fractures .  Coronary artery disease, stable .  Essential hypertension , dyslipidemia  .  No change in therapy .  Continue home physical therapy .  Follow-up rehabilitation in 3 days as scheduled  .  Return here in 6 months or as needed  Nyoka Cowden, MD

## 2016-02-17 NOTE — Telephone Encounter (Signed)
Shanon Brow OT with [facility]) needs to get clarification on OT orders for patient.  If someone could call him at [phone].

## 2016-02-20 ENCOUNTER — Encounter: Payer: Medicare Other | Admitting: Physical Medicine & Rehabilitation

## 2016-02-21 ENCOUNTER — Telehealth: Payer: Self-pay | Admitting: Internal Medicine

## 2016-02-21 DIAGNOSIS — G479 Sleep disorder, unspecified: Secondary | ICD-10-CM

## 2016-02-21 DIAGNOSIS — Z7409 Other reduced mobility: Secondary | ICD-10-CM

## 2016-02-21 NOTE — Telephone Encounter (Addendum)
Daughter in law called for pt to request home health. (she lives in [place]) [place] Mrs Vanduyne she is not on [place], so will assist in getting one on file. In the meantime:  Pt seen 6/26 for post hospital follow up appointment. Pt sleeping on the couch,  Pt is having a hard time getting around.and getting up so cannot sleep in bed.  Going to the bathroom every 2 hours. Not sleeping at night. Having a hard time showering, dressing. Would like to know if dr Raliegh Ip will order home health assessment.  Advised we will need to call the pt to set up.

## 2016-02-24 DIAGNOSIS — S065X1D Traumatic subdural hemorrhage with loss of consciousness of 30 minutes or less, subsequent encounter: Secondary | ICD-10-CM | POA: Diagnosis not present

## 2016-02-24 DIAGNOSIS — S22060D Wedge compression fracture of T7-T8 vertebra, subsequent encounter for fracture with routine healing: Secondary | ICD-10-CM

## 2016-02-24 DIAGNOSIS — S2242XD Multiple fractures of ribs, left side, subsequent encounter for fracture with routine healing: Secondary | ICD-10-CM | POA: Diagnosis not present

## 2016-02-24 DIAGNOSIS — I2510 Atherosclerotic heart disease of native coronary artery without angina pectoris: Secondary | ICD-10-CM

## 2016-02-24 DIAGNOSIS — R2681 Unsteadiness on feet: Secondary | ICD-10-CM | POA: Diagnosis not present

## 2016-02-24 DIAGNOSIS — Z7409 Other reduced mobility: Secondary | ICD-10-CM | POA: Diagnosis not present

## 2016-02-24 DIAGNOSIS — S32010D Wedge compression fracture of first lumbar vertebra, subsequent encounter for fracture with routine healing: Secondary | ICD-10-CM

## 2016-02-24 DIAGNOSIS — S0219XD Other fracture of base of skull, subsequent encounter for fracture with routine healing: Secondary | ICD-10-CM

## 2016-02-24 NOTE — Telephone Encounter (Signed)
Okay for home health assessment

## 2016-02-24 NOTE — Telephone Encounter (Signed)
I called the pts daughter in law and advised her the referral was placed and someone will call with further information for the assessment and she agreed.

## 2016-02-27 ENCOUNTER — Encounter (INDEPENDENT_AMBULATORY_CARE_PROVIDER_SITE_OTHER): Payer: Medicare Other | Admitting: Ophthalmology

## 2016-02-27 DIAGNOSIS — I10 Essential (primary) hypertension: Secondary | ICD-10-CM

## 2016-02-27 DIAGNOSIS — H35033 Hypertensive retinopathy, bilateral: Secondary | ICD-10-CM | POA: Diagnosis not present

## 2016-02-27 DIAGNOSIS — H43813 Vitreous degeneration, bilateral: Secondary | ICD-10-CM

## 2016-02-27 DIAGNOSIS — H353221 Exudative age-related macular degeneration, left eye, with active choroidal neovascularization: Secondary | ICD-10-CM

## 2016-02-27 DIAGNOSIS — H353112 Nonexudative age-related macular degeneration, right eye, intermediate dry stage: Secondary | ICD-10-CM | POA: Diagnosis not present

## 2016-03-05 ENCOUNTER — Telehealth: Payer: Self-pay | Admitting: *Deleted

## 2016-03-05 NOTE — Telephone Encounter (Signed)
OT called for verbal orders for OT home health 2week2, verbal orders given per office protocol

## 2016-03-11 ENCOUNTER — Encounter: Payer: Medicare Other | Attending: Physical Medicine & Rehabilitation | Admitting: Physical Medicine & Rehabilitation

## 2016-03-11 ENCOUNTER — Encounter: Payer: Self-pay | Admitting: Physical Medicine & Rehabilitation

## 2016-03-11 VITALS — BP 163/68 | HR 85 | Resp 14

## 2016-03-11 DIAGNOSIS — W19XXXD Unspecified fall, subsequent encounter: Secondary | ICD-10-CM | POA: Diagnosis not present

## 2016-03-11 DIAGNOSIS — E785 Hyperlipidemia, unspecified: Secondary | ICD-10-CM | POA: Insufficient documentation

## 2016-03-11 DIAGNOSIS — H3530 Unspecified macular degeneration: Secondary | ICD-10-CM | POA: Insufficient documentation

## 2016-03-11 DIAGNOSIS — Z5189 Encounter for other specified aftercare: Secondary | ICD-10-CM | POA: Insufficient documentation

## 2016-03-11 DIAGNOSIS — Z87891 Personal history of nicotine dependence: Secondary | ICD-10-CM | POA: Insufficient documentation

## 2016-03-11 DIAGNOSIS — E039 Hypothyroidism, unspecified: Secondary | ICD-10-CM | POA: Diagnosis not present

## 2016-03-11 DIAGNOSIS — Z85828 Personal history of other malignant neoplasm of skin: Secondary | ICD-10-CM | POA: Diagnosis not present

## 2016-03-11 DIAGNOSIS — I10 Essential (primary) hypertension: Secondary | ICD-10-CM | POA: Diagnosis not present

## 2016-03-11 DIAGNOSIS — Z87820 Personal history of traumatic brain injury: Secondary | ICD-10-CM | POA: Insufficient documentation

## 2016-03-11 DIAGNOSIS — I2510 Atherosclerotic heart disease of native coronary artery without angina pectoris: Secondary | ICD-10-CM | POA: Insufficient documentation

## 2016-03-11 DIAGNOSIS — S065X1S Traumatic subdural hemorrhage with loss of consciousness of 30 minutes or less, sequela: Secondary | ICD-10-CM

## 2016-03-11 DIAGNOSIS — G479 Sleep disorder, unspecified: Secondary | ICD-10-CM | POA: Insufficient documentation

## 2016-03-11 DIAGNOSIS — Z8546 Personal history of malignant neoplasm of prostate: Secondary | ICD-10-CM | POA: Insufficient documentation

## 2016-03-11 DIAGNOSIS — S22069A Unspecified fracture of T7-T8 vertebra, initial encounter for closed fracture: Secondary | ICD-10-CM | POA: Diagnosis not present

## 2016-03-11 MED ORDER — TRAZODONE HCL 50 MG PO TABS: 25.0000 mg | ORAL_TABLET | Freq: Every evening | ORAL | Status: DC | PRN

## 2016-03-11 NOTE — Addendum Note (Signed)
Addended by: Delice Lesch A on: 03/11/2016 11:48 AM   Modules accepted: Orders

## 2016-03-11 NOTE — Progress Notes (Signed)
Subjective:    Patient ID: Raymond Swanson, male    DOB: 07-25-1925, 82 y.o.   MRN: [id number]  HPI  82 year old gentleman with history of CAD, macular degeneration, prostate cancer who presents for hospital follow up after TBI/polytrauma (including L1, T8 fx's) after fall.  Since discharge, he states he is progressing every day. He gait has improved to the point where can ambulate without his walker at times.  He remains active.  He has not seen Dr. Saintclair Halsted. He saw his PCP.  He denies pain. He continues to have poor sleep (baseline). He is taking the trazodone.  He does not have problems with his appetite.   Limited history due to [facility].    DME: None Therapies: 2/week for PT/OT Mobility: Rolling walker    Pain Inventory Average Pain 0 Pain Right Now 0 My pain is no pain  In the last 24 hours, has pain interfered with the following? General activity 0 Relation with others 0 Enjoyment of life 0 What TIME of day is your pain at its worst? night Sleep (in general) Poor  Pain is worse with: some activites Pain improves with: therapy/exercise Relief from Meds: 8  Mobility walk with assistance use a walker ability to climb steps?  no do you drive?  no Do you have any goals in this area?  yes  Function retired I need assistance with the following:  household duties and shopping  Neuro/Psych bladder control problems weakness trouble walking  Prior Studies hospital f/u  Physicians involved in your care hospital f/u   Family History  Problem Relation Age of Onset  . Lung cancer Brother 43  . GI problems Sister    Social History   Social History  . Marital Status: Married    Spouse Name: N/A  . Number of Children: N/A  . Years of Education: N/A   Social History Main Topics  . Smoking status: Former Smoker -- 1.00 packs/day for 20 years    Types: Cigarettes    Quit date: 10/28/1948  . Smokeless tobacco: Never Used  . Alcohol Use: Yes  . Drug Use: No  . Sexual  Activity: Not Asked   Other Topics Concern  . None   Social History Narrative   Past Surgical History  Procedure Laterality Date  . Coronary artery bypass graft      1991  . Hernia repair      ingunial  . Prostate surgery      prostatectomy  . Mohs surgery    . Cataract extraction     Past Medical History  Diagnosis Date  . Cancer ([place])   . ACUT GASTR ULCER W/HEMORR W/O MENTION OBST 09/10/2009  . ANEMIA 10/08/2009  . CORONARY ARTERY DISEASE 01/26/2007  . HYPERLIPIDEMIA 01/26/2007  . HYPERTENSION 01/26/2007  . HYPOTHYROIDISM 10/18/2007  . MELENA 08/28/2009  . PROSTATE CANCER, HX OF 01/26/2007  . SKIN CANCER, HX OF 01/26/2007   BP 163/68 mmHg  Pulse 85  Resp 14  SpO2 94%  Opioid Risk Score:   Fall Risk Score:  `1  Depression screen PHQ 2/9  Depression screen [facility] 2/9 03/11/2016 12/31/2015 12/25/2014 12/05/2013  Decreased Interest 2 0 0 0  Down, Depressed, Hopeless 2 0 0 0  PHQ - 2 Score 4 0 0 0  Altered sleeping 3 - - -  Tired, decreased energy 3 - - -  Change in appetite 0 - - -  Feeling bad or failure about yourself  0 - - -  Trouble concentrating 0 - - -  Moving slowly or fidgety/restless 0 - - -  Suicidal thoughts 0 - - -  PHQ-9 Score 10 - - -  Difficult doing work/chores Somewhat difficult - - -     Review of Systems  HENT: Positive for hearing loss.   Eyes: Negative.   Respiratory: Negative.   Cardiovascular: Negative.   Gastrointestinal: Negative.   Endocrine: Negative.   Genitourinary: Positive for difficulty urinating.  Musculoskeletal: Positive for gait problem.  Skin: Negative.   Allergic/Immunologic: Negative.   Neurological: Positive for weakness.  Hematological: Negative.   Psychiatric/Behavioral: Negative.   All other systems reviewed and are negative.      Objective:   Physical Exam Constitutional: He appears well-developed and well-nourished. NAD. HENT:  Normocephalic and atraumatic.  Eyes: Conjunctivae and EOM are normal.    Cardiovascular:  Normal rate and regular rhythm.  No murmur heard. Respiratory: Effort normal. He has no wheezes. He has no rales.   GI: Soft. Bowel sounds are normal. He exhibits no distension. There is no tenderness.  Gait: Kyphotic posture with rolling walker Musculoskeletal: He exhibits no edema or tenderness.  Neurological: He is alert and oriented to person, place, and time.  HOH.  B/l UE 4/5 proximal to distal.   B/l LE's:  4+/5 proximal to distal. Skin: Skin is warm and dry.  Psychiatric: He has a normal mood and affect. His speech is normal and behavior is normal.     Assessment & Plan:  82 year old gentleman with history of CAD, macular degeneration, prostate cancer who presents for hospital follow up after TBI/polytrauma (including L1, T8 fx's) after fall.   1.  S/p TBI/polytrauma (including L1, T8 fx's) after fall  Pt improving day by day with increase in functional independence since discharge  Cont therapies  Follow up with Dr. Saintclair Halsted  2. Sleep disturbance  Trazodone refilled  3. HTN  Follow up with PCP regarding BP.  He states it is only when he comes to the doctors office, but has never had a problem  Medications reviewed Referrals reviewed All questions answered

## 2016-03-11 NOTE — Patient Instructions (Addendum)
Please follow up with Dr. Kary Kos: Neurosurgery  Contact information:  [address] [place] [address] [place] [address] [phone]

## 2016-03-17 ENCOUNTER — Telehealth: Payer: Self-pay | Admitting: Physical Medicine & Rehabilitation

## 2016-03-17 NOTE — Telephone Encounter (Signed)
Wes Reynolds PT with [facility] needs to get verbal orders to extend patient 2w3; 1w1.  Please call him at [phone].

## 2016-03-31 DIAGNOSIS — I609 Nontraumatic subarachnoid hemorrhage, unspecified: Secondary | ICD-10-CM | POA: Diagnosis not present

## 2016-04-07 ENCOUNTER — Telehealth: Payer: Self-pay

## 2016-04-07 NOTE — Telephone Encounter (Signed)
Wes-PT from [facility] at [facility] like to recert PT for 1 time this week, then 2wk6. Verbal order approved per office protocol.

## 2016-04-08 DIAGNOSIS — S2242XD Multiple fractures of ribs, left side, subsequent encounter for fracture with routine healing: Secondary | ICD-10-CM | POA: Diagnosis not present

## 2016-04-08 DIAGNOSIS — S22060D Wedge compression fracture of T7-T8 vertebra, subsequent encounter for fracture with routine healing: Secondary | ICD-10-CM | POA: Diagnosis not present

## 2016-04-08 DIAGNOSIS — I2510 Atherosclerotic heart disease of native coronary artery without angina pectoris: Secondary | ICD-10-CM | POA: Diagnosis not present

## 2016-04-08 DIAGNOSIS — S065X1S Traumatic subdural hemorrhage with loss of consciousness of 30 minutes or less, sequela: Secondary | ICD-10-CM | POA: Diagnosis not present

## 2016-04-08 DIAGNOSIS — S0219XD Other fracture of base of skull, subsequent encounter for fracture with routine healing: Secondary | ICD-10-CM | POA: Diagnosis not present

## 2016-04-08 DIAGNOSIS — S32010D Wedge compression fracture of first lumbar vertebra, subsequent encounter for fracture with routine healing: Secondary | ICD-10-CM | POA: Diagnosis not present

## 2016-04-14 DIAGNOSIS — H5000 Unspecified esotropia: Secondary | ICD-10-CM | POA: Diagnosis not present

## 2016-04-14 DIAGNOSIS — H353132 Nonexudative age-related macular degeneration, bilateral, intermediate dry stage: Secondary | ICD-10-CM | POA: Diagnosis not present

## 2016-04-14 DIAGNOSIS — Z961 Presence of intraocular lens: Secondary | ICD-10-CM | POA: Diagnosis not present

## 2016-05-07 ENCOUNTER — Ambulatory Visit (INDEPENDENT_AMBULATORY_CARE_PROVIDER_SITE_OTHER): Payer: Medicare Other

## 2016-05-07 DIAGNOSIS — Z23 Encounter for immunization: Secondary | ICD-10-CM

## 2016-05-12 DIAGNOSIS — C61 Malignant neoplasm of prostate: Secondary | ICD-10-CM | POA: Diagnosis not present

## 2016-05-19 DIAGNOSIS — R3129 Other microscopic hematuria: Secondary | ICD-10-CM | POA: Diagnosis not present

## 2016-05-19 DIAGNOSIS — C61 Malignant neoplasm of prostate: Secondary | ICD-10-CM | POA: Diagnosis not present

## 2016-05-19 DIAGNOSIS — R9721 Rising PSA following treatment for malignant neoplasm of prostate: Secondary | ICD-10-CM | POA: Diagnosis not present

## 2016-05-21 ENCOUNTER — Ambulatory Visit (INDEPENDENT_AMBULATORY_CARE_PROVIDER_SITE_OTHER): Payer: Medicare Other | Admitting: Family Medicine

## 2016-05-21 ENCOUNTER — Encounter: Payer: Self-pay | Admitting: Family Medicine

## 2016-05-21 VITALS — BP 145/83 | HR 88 | Temp 97.8°F | Ht 68.0 in | Wt 129.0 lb

## 2016-05-21 DIAGNOSIS — L929 Granulomatous disorder of the skin and subcutaneous tissue, unspecified: Secondary | ICD-10-CM

## 2016-05-21 NOTE — Progress Notes (Signed)
Pre visit review using our clinic review tool, if applicable. No additional management support is needed unless otherwise documented below in the visit note. 

## 2016-05-21 NOTE — Progress Notes (Signed)
   Subjective:    Patient ID: Raymond Swanson, male    DOB: 26-Jul-1925, 82 y.o.   MRN: [id number]  HPI Here to check on a tender lesion on the left shoulder that first appeared after some trauma. On 01-28-16 he fell off his deck at home and landed on his left side. He was evaluated for minor injuries then but his shoulder was not a concern. However shortly after that a skin lesion appeared and it has been growing larger ever since. It is now tender and bleeds occasionally.    Review of Systems  Constitutional: Negative.   Respiratory: Negative.   Cardiovascular: Negative.   Skin: Positive for wound.  Neurological: Negative.        Objective:   Physical Exam  Constitutional: He appears well-developed and well-nourished.  Cardiovascular: Normal rate, regular rhythm, normal heart sounds and intact distal pulses.   Pulmonary/Chest: Effort normal and breath sounds normal.  Musculoskeletal:  The left shoulder joint is intact but there is a large papular bloody tender lesion on the skin of the posterior shoulder          Assessment & Plan:  This is a post-traumatic granuloma. We will refer him to the [facility] for excision.  Laurey Morale, MD

## 2016-05-25 ENCOUNTER — Telehealth: Payer: Self-pay | Admitting: Physical Medicine & Rehabilitation

## 2016-05-25 NOTE — Telephone Encounter (Signed)
Wes PT with [facility] needs to get verbal to extend patient to 2 week 3.  Please call him at [phone].

## 2016-05-25 NOTE — Telephone Encounter (Signed)
Verbal orders given  

## 2016-05-26 DIAGNOSIS — R3129 Other microscopic hematuria: Secondary | ICD-10-CM | POA: Diagnosis not present

## 2016-05-26 DIAGNOSIS — N200 Calculus of kidney: Secondary | ICD-10-CM | POA: Diagnosis not present

## 2016-06-09 ENCOUNTER — Encounter: Payer: Medicare Other | Attending: Physical Medicine & Rehabilitation | Admitting: Physical Medicine & Rehabilitation

## 2016-06-09 ENCOUNTER — Encounter: Payer: Self-pay | Admitting: Physical Medicine & Rehabilitation

## 2016-06-09 VITALS — BP 132/78 | HR 77 | Resp 14

## 2016-06-09 DIAGNOSIS — S22069A Unspecified fracture of T7-T8 vertebra, initial encounter for closed fracture: Secondary | ICD-10-CM | POA: Diagnosis not present

## 2016-06-09 DIAGNOSIS — E039 Hypothyroidism, unspecified: Secondary | ICD-10-CM | POA: Insufficient documentation

## 2016-06-09 DIAGNOSIS — G479 Sleep disorder, unspecified: Secondary | ICD-10-CM | POA: Insufficient documentation

## 2016-06-09 DIAGNOSIS — E785 Hyperlipidemia, unspecified: Secondary | ICD-10-CM | POA: Insufficient documentation

## 2016-06-09 DIAGNOSIS — S065X1S Traumatic subdural hemorrhage with loss of consciousness of 30 minutes or less, sequela: Secondary | ICD-10-CM | POA: Diagnosis not present

## 2016-06-09 DIAGNOSIS — I10 Essential (primary) hypertension: Secondary | ICD-10-CM | POA: Diagnosis not present

## 2016-06-09 DIAGNOSIS — Z87891 Personal history of nicotine dependence: Secondary | ICD-10-CM | POA: Insufficient documentation

## 2016-06-09 DIAGNOSIS — Z87820 Personal history of traumatic brain injury: Secondary | ICD-10-CM | POA: Diagnosis not present

## 2016-06-09 DIAGNOSIS — Z5189 Encounter for other specified aftercare: Secondary | ICD-10-CM | POA: Diagnosis not present

## 2016-06-09 DIAGNOSIS — I2510 Atherosclerotic heart disease of native coronary artery without angina pectoris: Secondary | ICD-10-CM | POA: Diagnosis not present

## 2016-06-09 DIAGNOSIS — H3530 Unspecified macular degeneration: Secondary | ICD-10-CM | POA: Insufficient documentation

## 2016-06-09 DIAGNOSIS — Z8546 Personal history of malignant neoplasm of prostate: Secondary | ICD-10-CM | POA: Insufficient documentation

## 2016-06-09 DIAGNOSIS — Z85828 Personal history of other malignant neoplasm of skin: Secondary | ICD-10-CM | POA: Diagnosis not present

## 2016-06-09 NOTE — Progress Notes (Signed)
Subjective:    Patient ID: Raymond Swanson, male    DOB: Feb 23, 1925, 82 y.o.   MRN: [id number]  HPI   Raymond Swanson is here in follow up of his TBI and polytrauma. He is doing quite well. He is using a cane for balance. He hasn't had any falls. He states he's had some near misses in the morning when he first gets up in the morning.   He is exercising 1-2 hours daily. He loves to walk.   He does states that he has a lack of energy but that it was a problem prior to the injury. His energy may be improving a little however.     Pain Inventory Average Pain 0 Pain Right Now 0 My pain is No pain  In the last 24 hours, has pain interfered with the following? General activity 0 Relation with others 0 Enjoyment of life 0 What TIME of day is your pain at its worst? No pain Sleep (in general) Fair  Pain is worse with: No pain Pain improves with: No pain Relief from Meds: No pain  Mobility use a cane use a walker how many minutes can you walk? 10-15 ability to climb steps?  no do you drive?  yes Do you have any goals in this area?  yes  Function retired  Neuro/Psych bladder control problems trouble walking  Prior Studies Any changes since last visit?  no  Physicians involved in your care Any changes since last visit?  no   Family History  Problem Relation Age of Onset  . Lung cancer Brother 38  . GI problems Sister    Social History   Social History  . Marital status: Married    Spouse name: N/A  . Number of children: N/A  . Years of education: N/A   Social History Main Topics  . Smoking status: Former Smoker    Packs/day: 1.00    Years: 20.00    Types: Cigarettes    Quit date: 10/28/1948  . Smokeless tobacco: Never Used  . Alcohol use Yes  . Drug use: No  . Sexual activity: Not Asked   Other Topics Concern  . None   Social History Narrative  . None   Past Surgical History:  Procedure Laterality Date  . CATARACT EXTRACTION    . CORONARY ARTERY BYPASS  GRAFT     1991  . HERNIA REPAIR     ingunial  . MOHS SURGERY    . PROSTATE SURGERY     prostatectomy   Past Medical History:  Diagnosis Date  . ACUT GASTR ULCER W/HEMORR W/O MENTION OBST 09/10/2009  . ANEMIA 10/08/2009  . Cancer ([place])   . CORONARY ARTERY DISEASE 01/26/2007  . HYPERLIPIDEMIA 01/26/2007  . HYPERTENSION 01/26/2007  . HYPOTHYROIDISM 10/18/2007  . MELENA 08/28/2009  . PROSTATE CANCER, HX OF 01/26/2007  . SKIN CANCER, HX OF 01/26/2007   BP 132/78   Pulse 77   Resp 14   SpO2 94%   Opioid Risk Score:   Fall Risk Score:  `1  Depression screen PHQ 2/9  Depression screen [facility] 2/9 03/11/2016 12/31/2015 12/25/2014 12/05/2013  Decreased Interest 2 0 0 0  Down, Depressed, Hopeless 2 0 0 0  PHQ - 2 Score 4 0 0 0  Altered sleeping 3 - - -  Tired, decreased energy 3 - - -  Change in appetite 0 - - -  Feeling bad or failure about yourself  0 - - -  Trouble concentrating  0 - - -  Moving slowly or fidgety/restless 0 - - -  Suicidal thoughts 0 - - -  PHQ-9 Score 10 - - -  Difficult doing work/chores Somewhat difficult - - -    Review of Systems  Respiratory: Positive for shortness of breath.   All other systems reviewed and are negative.      Objective:   Physical Exam  Constitutional: He appears well-developed and well-nourished. NAD. HENT: Normocephalic and atraumatic.  Eyes: Conjunctivae and EOM are normal.  Cardiovascular: Normal rate and regular rhythm. No murmur heard. Respiratory: Effort normal. He has no wheezes. He has no rales.   GI: Soft. Bowel sounds are normal. He exhibits no distension. There is no tenderness.  Gait: Kyphotic posture with rolling walker Musculoskeletal: He exhibits no edema or tenderness.  Neurological: He is alert and oriented to person, place, and time.  HOH.  B/l UE 5/5 proximal to distal.   B/l LE's:  5/5 proximal to distal. Skin: Skin is warm and dry.  Psychiatric: He has a normal mood and affect. His speech is normal and behavior is  normal.     Assessment & Plan:   1. S/p TBI/polytrauma (including L1, T8 fx's) after fall            --near baseline  -looks great  -may resume ASA as he was taking prior to the injury  -Cane for longer dx  2. Sleep disturbance---resolved             Trazodone refilled  3. HTN             Follow up with PCP regarding BP.    Follow up with me PRN

## 2016-06-09 NOTE — Patient Instructions (Signed)
BRING YOUR CANE WITH YOU WHEN YOU WALK LONGER DISTANCES    YOU MAY RESUME ASPIRIN   PLEASE CALL ME WITH ANY PROBLEMS OR QUESTIONS [phone])

## 2016-06-15 DIAGNOSIS — H903 Sensorineural hearing loss, bilateral: Secondary | ICD-10-CM | POA: Diagnosis not present

## 2016-06-23 DIAGNOSIS — C61 Malignant neoplasm of prostate: Secondary | ICD-10-CM | POA: Diagnosis not present

## 2016-06-23 DIAGNOSIS — R3129 Other microscopic hematuria: Secondary | ICD-10-CM | POA: Diagnosis not present

## 2016-06-23 DIAGNOSIS — R9721 Rising PSA following treatment for malignant neoplasm of prostate: Secondary | ICD-10-CM | POA: Diagnosis not present

## 2016-06-24 ENCOUNTER — Encounter (INDEPENDENT_AMBULATORY_CARE_PROVIDER_SITE_OTHER): Payer: Medicare Other | Admitting: Ophthalmology

## 2016-06-24 DIAGNOSIS — H353112 Nonexudative age-related macular degeneration, right eye, intermediate dry stage: Secondary | ICD-10-CM | POA: Diagnosis not present

## 2016-06-24 DIAGNOSIS — H353221 Exudative age-related macular degeneration, left eye, with active choroidal neovascularization: Secondary | ICD-10-CM | POA: Diagnosis not present

## 2016-06-24 DIAGNOSIS — I10 Essential (primary) hypertension: Secondary | ICD-10-CM

## 2016-06-24 DIAGNOSIS — H43813 Vitreous degeneration, bilateral: Secondary | ICD-10-CM

## 2016-06-24 DIAGNOSIS — H35033 Hypertensive retinopathy, bilateral: Secondary | ICD-10-CM | POA: Diagnosis not present

## 2016-06-26 ENCOUNTER — Encounter: Payer: Self-pay | Admitting: Internal Medicine

## 2016-06-26 ENCOUNTER — Ambulatory Visit (INDEPENDENT_AMBULATORY_CARE_PROVIDER_SITE_OTHER): Payer: Medicare Other | Admitting: Internal Medicine

## 2016-06-26 VITALS — BP 120/80 | HR 85 | Temp 97.6°F | Resp 20 | Ht 68.0 in | Wt 131.5 lb

## 2016-06-26 DIAGNOSIS — S065X0D Traumatic subdural hemorrhage without loss of consciousness, subsequent encounter: Secondary | ICD-10-CM

## 2016-06-26 DIAGNOSIS — L989 Disorder of the skin and subcutaneous tissue, unspecified: Secondary | ICD-10-CM | POA: Diagnosis not present

## 2016-06-26 DIAGNOSIS — I10 Essential (primary) hypertension: Secondary | ICD-10-CM | POA: Diagnosis not present

## 2016-06-26 NOTE — Progress Notes (Signed)
Pre visit review using our clinic review tool, if applicable. No additional management support is needed unless otherwise documented below in the visit note. 

## 2016-06-26 NOTE — Progress Notes (Signed)
Subjective:    Patient ID: Raymond Swanson, male    DOB: [birth year], 82 y.o.   MRN: [id number]  HPI  82 year old patient who was hospitalized in June after a fall resulting in multitrauma including a subdural hematoma He states that since this traumatic incident, he has developed skin lesions involving his left shoulder and left lower arm.  These are enlarging and remained tender  Past Medical History:  Diagnosis Date  . ACUT GASTR ULCER W/HEMORR W/O MENTION OBST 09/10/2009  . ANEMIA 10/08/2009  . Cancer ([place])   . CORONARY ARTERY DISEASE 01/26/2007  . HYPERLIPIDEMIA 01/26/2007  . HYPERTENSION 01/26/2007  . HYPOTHYROIDISM 10/18/2007  . MELENA 08/28/2009  . PROSTATE CANCER, HX OF 01/26/2007  . SKIN CANCER, HX OF 01/26/2007     Social History   Social History  . Marital status: Married    Spouse name: N/A  . Number of children: N/A  . Years of education: N/A   Occupational History  . Not on file.   Social History Main Topics  . Smoking status: Former Smoker    Packs/day: 1.00    Years: 20.00    Types: Cigarettes    Quit date: 10/28/1948  . Smokeless tobacco: Never Used  . Alcohol use Yes  . Drug use: No  . Sexual activity: Not on file   Other Topics Concern  . Not on file   Social History Narrative  . No narrative on file    Past Surgical History:  Procedure Laterality Date  . CATARACT EXTRACTION    . CORONARY ARTERY BYPASS GRAFT     1991  . HERNIA REPAIR     ingunial  . MOHS SURGERY    . PROSTATE SURGERY     prostatectomy    Family History  Problem Relation Age of Onset  . Lung cancer Brother 23  . GI problems Sister     No Known Allergies  Current Outpatient Prescriptions on File Prior to Visit  Medication Sig Dispense Refill  . acetaminophen (TYLENOL) 325 MG tablet Take 1 tablet (325 mg total) by mouth every 6 (six) hours as needed.    . calcium gluconate 500 MG tablet Take 500 mg by mouth daily.      . cholecalciferol (VITAMIN D) 1000 UNITS tablet Take  1,000 Units by mouth daily.      Marland Kitchen docusate sodium (COLACE) 100 MG capsule Take 1 capsule (100 mg total) by mouth 2 (two) times daily. 60 capsule 0  . famotidine (PEPCID) 20 MG tablet Take 1 tablet (20 mg total) by mouth 2 (two) times daily. 60 tablet 0  . levothyroxine (SYNTHROID, LEVOTHROID) 50 MCG tablet Take 1 tablet (50 mcg total) by mouth daily. 90 tablet 3  . lidocaine (LIDODERM) 5 % Apply to ribs at 7 am and remove at 7 pm daily. 60 patch 0  . Melatonin 5 MG TABS Take 2.5 mg by mouth at bedtime.    . Multiple Vitamins-Minerals (PRESERVISION/LUTEIN PO) Take by mouth. Daily       . nitroGLYCERIN (NITROSTAT) 0.4 MG SL tablet Place 1 tablet (0.4 mg total) under the tongue every 5 (five) minutes as needed. 20 tablet 5  . polyethylene glycol (MIRALAX / GLYCOLAX) packet Take 17 g by mouth 2 (two) times daily. 60 each 0  . simvastatin (ZOCOR) 20 MG tablet Take 1 tablet (20 mg total) by mouth daily. 90 tablet 3  . traZODone (DESYREL) 50 MG tablet Take 0.5 tablets (25 mg total) by mouth at  bedtime as needed for sleep. 30 tablet 2   No current facility-administered medications on file prior to visit.     BP 120/80 (BP Location: Left Arm, Patient Position: Sitting, Cuff Size: Normal)   Pulse 85   Temp 97.6 F (36.4 C) (Oral)   Resp 20   Ht 5\' 8"  (1.727 m)   Wt 131 lb 8 oz (59.6 kg)   SpO2 98%   BMI 19.99 kg/m     Review of Systems  Constitutional: Positive for fatigue.  Skin: Positive for wound.  Neurological: Positive for weakness.       Objective:   Physical Exam  Constitutional: He appears well-developed and well-nourished. No distress.  Frail.  Blood pressure normal   Skin:  The patient has 3 skin lesions, the largest 15 x 20 mm involving his left posterior shoulder area, which is raised and papillomatosis friable and bleeds easily He has 2 smaller lesions distal to the elbow of 20 x 20 mm and 10 x 15 mm; these are slightly raised and tender to touch            Assessment & Plan:   Posttraumatic papillomatous skin lesions, left shoulder and arm.  Will set up for general surgery evaluation and excision under local anesthesia History of essential hypertension.  Remains stable off medication.  We'll continue to monitor Status post fall with multitrauma  Follow-up 6 months  [id number],PETER Pilar Plate

## 2016-06-26 NOTE — Patient Instructions (Signed)
General surgery referral as discussed  Return in 6 months for follow-up

## 2016-07-02 DIAGNOSIS — H6122 Impacted cerumen, left ear: Secondary | ICD-10-CM | POA: Diagnosis not present

## 2016-07-02 DIAGNOSIS — H9313 Tinnitus, bilateral: Secondary | ICD-10-CM | POA: Diagnosis not present

## 2016-07-02 DIAGNOSIS — H903 Sensorineural hearing loss, bilateral: Secondary | ICD-10-CM | POA: Diagnosis not present

## 2016-07-03 DIAGNOSIS — H02051 Trichiasis without entropian right upper eyelid: Secondary | ICD-10-CM | POA: Diagnosis not present

## 2016-07-21 ENCOUNTER — Ambulatory Visit: Payer: Self-pay | Admitting: General Surgery

## 2016-07-21 DIAGNOSIS — L929 Granulomatous disorder of the skin and subcutaneous tissue, unspecified: Secondary | ICD-10-CM | POA: Diagnosis not present

## 2016-07-21 DIAGNOSIS — R2232 Localized swelling, mass and lump, left upper limb: Secondary | ICD-10-CM | POA: Diagnosis not present

## 2016-08-10 ENCOUNTER — Encounter (HOSPITAL_COMMUNITY): Payer: Self-pay

## 2016-08-10 ENCOUNTER — Encounter (HOSPITAL_COMMUNITY)
Admission: RE | Admit: 2016-08-10 | Discharge: 2016-08-10 | Disposition: A | Discharge status: Home / Self Care | Payer: Medicare Other | Source: Ambulatory Visit | Attending: General Surgery | Admitting: General Surgery

## 2016-08-10 DIAGNOSIS — N400 Enlarged prostate without lower urinary tract symptoms: Secondary | ICD-10-CM | POA: Diagnosis not present

## 2016-08-10 DIAGNOSIS — C44629 Squamous cell carcinoma of skin of left upper limb, including shoulder: Secondary | ICD-10-CM | POA: Diagnosis not present

## 2016-08-10 DIAGNOSIS — Z8719 Personal history of other diseases of the digestive system: Secondary | ICD-10-CM | POA: Diagnosis not present

## 2016-08-10 DIAGNOSIS — Z8546 Personal history of malignant neoplasm of prostate: Secondary | ICD-10-CM | POA: Diagnosis not present

## 2016-08-10 DIAGNOSIS — Z951 Presence of aortocoronary bypass graft: Secondary | ICD-10-CM | POA: Diagnosis not present

## 2016-08-10 DIAGNOSIS — R2232 Localized swelling, mass and lump, left upper limb: Secondary | ICD-10-CM | POA: Diagnosis present

## 2016-08-10 DIAGNOSIS — Z87891 Personal history of nicotine dependence: Secondary | ICD-10-CM | POA: Diagnosis not present

## 2016-08-10 DIAGNOSIS — I10 Essential (primary) hypertension: Secondary | ICD-10-CM | POA: Diagnosis not present

## 2016-08-10 HISTORY — DX: Personal history of urinary calculi: Z87.442

## 2016-08-10 LAB — CBC WITH DIFFERENTIAL/PLATELET
Basophils Absolute: 0 10*3/uL (ref 0.0–0.1)
Basophils Relative: 0 %
Eosinophils Absolute: 0.1 10*3/uL (ref 0.0–0.7)
Eosinophils Relative: 1 %
HEMATOCRIT: 44.5 % (ref 39.0–52.0)
HEMOGLOBIN: 14.5 g/dL (ref 13.0–17.0)
LYMPHS ABS: 1.1 10*3/uL (ref 0.7–4.0)
LYMPHS PCT: 11 %
MCH: 30.3 pg (ref 26.0–34.0)
MCHC: 32.6 g/dL (ref 30.0–36.0)
MCV: 92.9 fL (ref 78.0–100.0)
MONO ABS: 0.7 10*3/uL (ref 0.1–1.0)
MONOS PCT: 7 %
NEUTROS ABS: 7.7 10*3/uL (ref 1.7–7.7)
NEUTROS PCT: 81 %
Platelets: 200 10*3/uL (ref 150–400)
RBC: 4.79 MIL/uL (ref 4.22–5.81)
RDW: 15.5 % (ref 11.5–15.5)
WBC: 9.6 10*3/uL (ref 4.0–10.5)

## 2016-08-10 LAB — BASIC METABOLIC PANEL
ANION GAP: 7 (ref 5–15)
BUN: 17 mg/dL (ref 6–20)
CHLORIDE: 106 mmol/L (ref 101–111)
CO2: 30 mmol/L (ref 22–32)
Calcium: 9.5 mg/dL (ref 8.9–10.3)
Creatinine, Ser: 1.04 mg/dL (ref 0.61–1.24)
GFR calc Af Amer: >60 mL/min (ref >60)
GLUCOSE: 105 mg/dL — AB (ref 65–99)
POTASSIUM: 3.8 mmol/L (ref 3.5–5.1)
Sodium: 143 mmol/L (ref 135–145)

## 2016-08-10 NOTE — Progress Notes (Signed)
PCP: Dr. Burnice Logan Cardiologist: Dr. Lynden Ang visit 10/2015, pt. States he see him yearly.

## 2016-08-10 NOTE — Pre-Procedure Instructions (Signed)
    Kenner Agro Slaven  08/10/2016      [id number], [place] [address] Phone: [phone] Fax: [phone]    Your procedure is scheduled on Tues.  Dec. 19  Report to [facility] Admitting at 9:45 A.M.  Call this number if you have problems the morning of surgery:  [phone]   Remember:  Do not eat food or drink liquids after midnight.  Take these medicines the morning of surgery with A SIP OF WATER : tylenol if  Needed, eye drops if needed, famotidine (pepcid), nitroglycerine if needed, levothyroxine(synthroid)              Stop aspirin, motrin, advil, aleve,vitamins/herbal medicines   Do not wear jewelry.  Do not wear lotions, powders, or cologne, or deoderant.  Do not shave 48 hours prior to surgery.  Men may shave face and neck.  Do not bring valuables to the hospital.  [facility] is not responsible for any belongings or valuables.  Contacts, dentures or bridgework may not be worn into surgery.  Leave your suitcase in the car.  After surgery it may be brought to your room.  For patients admitted to the hospital, discharge time will be determined by your treatment team.  Patients discharged the day of surgery will not be allowed to drive home.   Name and phone number of your driver:    Special instructions:  Review preparing for surgery handout  Please read over the following fact sheets that you were given. Coughing and Deep Breathing and Surgical Site Infection Prevention

## 2016-08-11 ENCOUNTER — Ambulatory Visit (HOSPITAL_COMMUNITY): Payer: Medicare Other | Admitting: Certified Registered Nurse Anesthetist

## 2016-08-11 ENCOUNTER — Ambulatory Visit (HOSPITAL_COMMUNITY)
Admission: RE | Admit: 2016-08-11 | Discharge: 2016-08-11 | Disposition: A | Discharge status: Home / Self Care | Payer: Medicare Other | Source: Ambulatory Visit | Attending: General Surgery | Admitting: General Surgery

## 2016-08-11 ENCOUNTER — Encounter (HOSPITAL_COMMUNITY)
Admission: RE | Disposition: A | Discharge status: Home / Self Care | Payer: Self-pay | Source: Ambulatory Visit | Attending: General Surgery

## 2016-08-11 ENCOUNTER — Encounter (HOSPITAL_COMMUNITY): Payer: Self-pay | Admitting: Certified Registered Nurse Anesthetist

## 2016-08-11 DIAGNOSIS — Z951 Presence of aortocoronary bypass graft: Secondary | ICD-10-CM | POA: Diagnosis not present

## 2016-08-11 DIAGNOSIS — C44629 Squamous cell carcinoma of skin of left upper limb, including shoulder: Secondary | ICD-10-CM | POA: Diagnosis not present

## 2016-08-11 DIAGNOSIS — Z8719 Personal history of other diseases of the digestive system: Secondary | ICD-10-CM | POA: Diagnosis not present

## 2016-08-11 DIAGNOSIS — I10 Essential (primary) hypertension: Secondary | ICD-10-CM | POA: Diagnosis not present

## 2016-08-11 DIAGNOSIS — N400 Enlarged prostate without lower urinary tract symptoms: Secondary | ICD-10-CM | POA: Insufficient documentation

## 2016-08-11 DIAGNOSIS — Z87891 Personal history of nicotine dependence: Secondary | ICD-10-CM | POA: Insufficient documentation

## 2016-08-11 DIAGNOSIS — S61502A Unspecified open wound of left wrist, initial encounter: Secondary | ICD-10-CM | POA: Diagnosis not present

## 2016-08-11 DIAGNOSIS — Z8546 Personal history of malignant neoplasm of prostate: Secondary | ICD-10-CM | POA: Diagnosis not present

## 2016-08-11 DIAGNOSIS — S41002A Unspecified open wound of left shoulder, initial encounter: Secondary | ICD-10-CM | POA: Diagnosis not present

## 2016-08-11 DIAGNOSIS — S51802A Unspecified open wound of left forearm, initial encounter: Secondary | ICD-10-CM | POA: Diagnosis not present

## 2016-08-11 DIAGNOSIS — D0462 Carcinoma in situ of skin of left upper limb, including shoulder: Secondary | ICD-10-CM | POA: Diagnosis not present

## 2016-08-11 HISTORY — PX: EXCISION MASS UPPER EXTREMETIES: SHX6704

## 2016-08-11 SURGERY — EXCISION MASS UPPER EXTREMITIES
Anesthesia: General | Site: Shoulder | Laterality: Left

## 2016-08-11 MED ORDER — FENTANYL CITRATE (PF) 100 MCG/2ML IJ SOLN
INTRAMUSCULAR | Status: DC | PRN
Start: 2016-08-11 — End: 2016-08-11
  Administered 2016-08-11: 50 ug via INTRAVENOUS

## 2016-08-11 MED ORDER — FENTANYL CITRATE (PF) 100 MCG/2ML IJ SOLN: 25.0000 ug | INTRAMUSCULAR | Status: DC | PRN

## 2016-08-11 MED ORDER — SUGAMMADEX SODIUM 200 MG/2ML IV SOLN
INTRAVENOUS | Status: DC | PRN
  Administered 2016-08-11: 120 mg via INTRAVENOUS

## 2016-08-11 MED ORDER — PHENYLEPHRINE 40 MCG/ML (10ML) SYRINGE FOR IV PUSH (FOR BLOOD PRESSURE SUPPORT)
PREFILLED_SYRINGE | INTRAVENOUS | Status: AC
  Filled 2016-08-11: qty 20

## 2016-08-11 MED ORDER — ONDANSETRON HCL 4 MG/2ML IJ SOLN
INTRAMUSCULAR | Status: AC
  Filled 2016-08-11: qty 6

## 2016-08-11 MED ORDER — LACTATED RINGERS IV SOLN
INTRAVENOUS | Status: DC
  Administered 2016-08-11: 10:00:00 via INTRAVENOUS

## 2016-08-11 MED ORDER — GLYCOPYRROLATE 0.2 MG/ML IJ SOLN
INTRAMUSCULAR | Status: DC | PRN
  Administered 2016-08-11: 0.2 mg via INTRAVENOUS

## 2016-08-11 MED ORDER — PHENYLEPHRINE HCL 10 MG/ML IJ SOLN
INTRAVENOUS | Status: DC | PRN
  Administered 2016-08-11: 20 ug/min via INTRAVENOUS

## 2016-08-11 MED ORDER — CHLORHEXIDINE GLUCONATE CLOTH 2 % EX PADS: 6.0000 | MEDICATED_PAD | Freq: Once | CUTANEOUS | Status: DC

## 2016-08-11 MED ORDER — DEXAMETHASONE SODIUM PHOSPHATE 10 MG/ML IJ SOLN
INTRAMUSCULAR | Status: AC
  Filled 2016-08-11: qty 3

## 2016-08-11 MED ORDER — ONDANSETRON HCL 4 MG/2ML IJ SOLN
INTRAMUSCULAR | Status: DC | PRN
  Administered 2016-08-11: 4 mg via INTRAVENOUS

## 2016-08-11 MED ORDER — PROPOFOL 10 MG/ML IV BOLUS
INTRAVENOUS | Status: AC
  Filled 2016-08-11: qty 20

## 2016-08-11 MED ORDER — KETOROLAC TROMETHAMINE 30 MG/ML IJ SOLN: 30.0000 mg | Freq: Once | INTRAMUSCULAR | Status: DC

## 2016-08-11 MED ORDER — TRAMADOL HCL 50 MG PO TABS: 50.0000 mg | ORAL_TABLET | Freq: Four times a day (QID) | ORAL | 0 refills | Status: DC | PRN

## 2016-08-11 MED ORDER — ROCURONIUM BROMIDE 100 MG/10ML IV SOLN
INTRAVENOUS | Status: DC | PRN
  Administered 2016-08-11: 20 mg via INTRAVENOUS

## 2016-08-11 MED ORDER — LIDOCAINE-EPINEPHRINE (PF) 1 %-1:200000 IJ SOLN
INTRAMUSCULAR | Status: AC
  Filled 2016-08-11: qty 30

## 2016-08-11 MED ORDER — PHENYLEPHRINE HCL 10 MG/ML IJ SOLN
INTRAMUSCULAR | Status: DC | PRN
  Administered 2016-08-11: 80 ug via INTRAVENOUS

## 2016-08-11 MED ORDER — FENTANYL CITRATE (PF) 100 MCG/2ML IJ SOLN
INTRAMUSCULAR | Status: AC
  Filled 2016-08-11: qty 2

## 2016-08-11 MED ORDER — POVIDONE-IODINE 10 % EX OINT
TOPICAL_OINTMENT | CUTANEOUS | Status: AC
  Filled 2016-08-11: qty 28.35

## 2016-08-11 MED ORDER — BUPIVACAINE HCL (PF) 0.5 % IJ SOLN
INTRAMUSCULAR | Status: DC | PRN
Start: 2016-08-11 — End: 2016-08-11
  Administered 2016-08-11: 30 mL

## 2016-08-11 MED ORDER — LIDOCAINE HCL (CARDIAC) 20 MG/ML IV SOLN
INTRAVENOUS | Status: DC | PRN
  Administered 2016-08-11: 100 mg via INTRAVENOUS

## 2016-08-11 MED ORDER — BUPIVACAINE HCL (PF) 0.5 % IJ SOLN
INTRAMUSCULAR | Status: AC
  Filled 2016-08-11: qty 30

## 2016-08-11 MED ORDER — BUPIVACAINE-EPINEPHRINE (PF) 0.25% -1:200000 IJ SOLN
INTRAMUSCULAR | Status: AC
  Filled 2016-08-11: qty 30

## 2016-08-11 MED ORDER — PROPOFOL 10 MG/ML IV BOLUS
INTRAVENOUS | Status: DC | PRN
  Administered 2016-08-11: 50 mg via INTRAVENOUS
  Administered 2016-08-11: 100 mg via INTRAVENOUS
  Administered 2016-08-11: 50 mg via INTRAVENOUS

## 2016-08-11 MED ORDER — SUGAMMADEX SODIUM 200 MG/2ML IV SOLN
INTRAVENOUS | Status: AC
  Filled 2016-08-11: qty 2

## 2016-08-11 MED ORDER — CEFAZOLIN SODIUM-DEXTROSE 2-4 GM/100ML-% IV SOLN
INTRAVENOUS | Status: AC
  Filled 2016-08-11: qty 100

## 2016-08-11 MED ORDER — BACITRACIN ZINC 500 UNIT/GM EX OINT
TOPICAL_OINTMENT | CUTANEOUS | Status: AC
  Filled 2016-08-11: qty 28.35

## 2016-08-11 MED ORDER — ONDANSETRON HCL 4 MG/2ML IJ SOLN: 4.0000 mg | Freq: Once | INTRAMUSCULAR | Status: DC | PRN

## 2016-08-11 MED ORDER — SUGAMMADEX SODIUM 500 MG/5ML IV SOLN
INTRAVENOUS | Status: AC
  Filled 2016-08-11: qty 5

## 2016-08-11 MED ORDER — ESMOLOL HCL 100 MG/10ML IV SOLN
INTRAVENOUS | Status: AC
  Filled 2016-08-11: qty 10

## 2016-08-11 MED ORDER — LIDOCAINE-EPINEPHRINE (PF) 1 %-1:200000 IJ SOLN
INTRAMUSCULAR | Status: DC | PRN
  Administered 2016-08-11: 30 mL

## 2016-08-11 MED ORDER — ROCURONIUM BROMIDE 50 MG/5ML IV SOSY
PREFILLED_SYRINGE | INTRAVENOUS | Status: AC
  Filled 2016-08-11: qty 5

## 2016-08-11 MED ORDER — CEFAZOLIN SODIUM-DEXTROSE 2-4 GM/100ML-% IV SOLN
2.0000 g | INTRAVENOUS | Status: AC
  Administered 2016-08-11: 2 g via INTRAVENOUS

## 2016-08-11 SURGICAL SUPPLY — 45 items
ADH SKN CLS APL DERMABOND .7 (GAUZE/BANDAGES/DRESSINGS)
BNDG GAUZE ELAST 4 BULKY (GAUZE/BANDAGES/DRESSINGS) ×1 IMPLANT
CANISTER SUCTION 2500CC (MISCELLANEOUS) IMPLANT
CHLORAPREP W/TINT 26ML (MISCELLANEOUS) ×2 IMPLANT
CLEANER TIP ELECTROSURG 2X2 (MISCELLANEOUS) ×2 IMPLANT
COVER SURGICAL LIGHT HANDLE (MISCELLANEOUS) ×2 IMPLANT
DECANTER SPIKE VIAL GLASS SM (MISCELLANEOUS) ×2 IMPLANT
DERMABOND ADVANCED (GAUZE/BANDAGES/DRESSINGS)
DERMABOND ADVANCED .7 DNX12 (GAUZE/BANDAGES/DRESSINGS) IMPLANT
DRAPE LAPAROTOMY T 102X78X121 (DRAPES) ×2 IMPLANT
DRAPE UTILITY XL STRL (DRAPES) ×4 IMPLANT
ELECT REM PT RETURN 9FT ADLT (ELECTROSURGICAL) ×2
ELECTRODE REM PT RTRN 9FT ADLT (ELECTROSURGICAL) ×1 IMPLANT
GAUZE SPONGE 4X4 12PLY STRL (GAUZE/BANDAGES/DRESSINGS) IMPLANT
GAUZE SPONGE 4X4 16PLY XRAY LF (GAUZE/BANDAGES/DRESSINGS) IMPLANT
GLOVE BIOGEL PI IND STRL 8 (GLOVE) ×1 IMPLANT
GLOVE BIOGEL PI INDICATOR 8 (GLOVE) ×1
GLOVE ECLIPSE 7.5 STRL STRAW (GLOVE) ×2 IMPLANT
GOWN STRL REUS W/ TWL LRG LVL3 (GOWN DISPOSABLE) ×2 IMPLANT
GOWN STRL REUS W/TWL LRG LVL3 (GOWN DISPOSABLE) ×4
KIT BASIN OR (CUSTOM PROCEDURE TRAY) ×2 IMPLANT
KIT ROOM TURNOVER OR (KITS) ×2 IMPLANT
NDL HYPO 25X1 1.5 SAFETY (NEEDLE) ×1 IMPLANT
NEEDLE HYPO 25X1 1.5 SAFETY (NEEDLE) ×2 IMPLANT
NS IRRIG 1000ML POUR BTL (IV SOLUTION) ×2 IMPLANT
PACK SURGICAL SETUP 50X90 (CUSTOM PROCEDURE TRAY) ×2 IMPLANT
PAD ARMBOARD 7.5X6 YLW CONV (MISCELLANEOUS) ×4 IMPLANT
PENCIL BUTTON HOLSTER BLD 10FT (ELECTRODE) ×2 IMPLANT
SPECIMEN JAR SMALL (MISCELLANEOUS) ×4 IMPLANT
SPONGE GAUZE 4X4 12PLY STER LF (GAUZE/BANDAGES/DRESSINGS) ×2 IMPLANT
SPONGE LAP 18X18 X RAY DECT (DISPOSABLE) ×2 IMPLANT
STRIP CLOSURE SKIN 1/2X4 (GAUZE/BANDAGES/DRESSINGS) IMPLANT
SUT ETHILON 3 0 PS 1 (SUTURE) ×5 IMPLANT
SUT MNCRL AB 4-0 PS2 18 (SUTURE) ×2 IMPLANT
SUT VIC AB 2-0 CT1 27 (SUTURE) ×2
SUT VIC AB 2-0 CT1 TAPERPNT 27 (SUTURE) IMPLANT
SUT VIC AB 3-0 SH 27 (SUTURE) ×2
SUT VIC AB 3-0 SH 27X BRD (SUTURE) ×1 IMPLANT
SYR BULB 3OZ (MISCELLANEOUS) ×2 IMPLANT
SYR CONTROL 10ML LL (SYRINGE) ×2 IMPLANT
TAPE CLOTH SURG 6X10 WHT LF (GAUZE/BANDAGES/DRESSINGS) ×2 IMPLANT
TOWEL OR 17X24 6PK STRL BLUE (TOWEL DISPOSABLE) ×2 IMPLANT
TOWEL OR 17X26 10 PK STRL BLUE (TOWEL DISPOSABLE) ×2 IMPLANT
TUBE CONNECTING 12X1/4 (SUCTIONS) ×1 IMPLANT
YANKAUER SUCT BULB TIP NO VENT (SUCTIONS) IMPLANT

## 2016-08-11 NOTE — Anesthesia Postprocedure Evaluation (Signed)
Anesthesia Post Note  Patient: Raymond Swanson  Procedure(s) Performed: Procedure(s) (LRB): EXCISION MASS LEFT SHOULDER, LEFT FOREARM, LEFT WRIST (Left)  Patient location during evaluation: PACU Anesthesia Type: General Level of consciousness: sedated and patient cooperative Pain management: pain level controlled Vital Signs Assessment: post-procedure vital signs reviewed and stable Respiratory status: spontaneous breathing Cardiovascular status: stable Anesthetic complications: no       Last Vitals:  Vitals:   08/11/16 1520 08/11/16 1537  BP: 127/66 135/71  Pulse: 91 94  Resp: 14 16  Temp: 36.4 C     Last Pain:  Vitals:   08/11/16 0933  TempSrc: Oral                 Nolon Nations

## 2016-08-11 NOTE — H&P (Signed)
  Raymond Swanson. Raymond Swanson 07/21/2016 11:28 AM Location: [facility] Patient #: [id number] DOB: [birth year] Married / Language: English / Race: White Male   The patient is a 82 year old male.  Other Problems Nance Pear, [place]; 07/21/2016 11:28 AM) Enlarged Prostate  Gastric Ulcer  Inguinal Hernia  Prostate Cancer  Thyroid Disease   Past Surgical History Nance Pear, [place]; 07/21/2016 11:28 AM) Cataract Surgery  Bilateral. Coronary Artery Bypass Graft  Prostate Surgery - Removal   Diagnostic Studies History Nance Pear, [place]; 07/21/2016 11:28 AM) Colonoscopy  >10 years ago  Allergies Nance Pear, [place]; 07/21/2016 11:29 AM) No Known Drug Allergies 07/21/2016  Medication History Nance Pear, CMA; 07/21/2016 11:31 AM) Levothyroxine Sodium (50MCG Tablet, Oral daily) Active. Vitamin D (Cholecalciferol) (1000UNIT Tablet, Oral daily) Active. Tylenol (325MG  Tablet, Oral as needed) Active. Melatonin (5MG  Tablet, Oral as needed) Active. Multivitamin Adults 50+ (Oral daily) Active. Nitrostat (0.4MG  Tab Sublingual, Sublingual as needed) Active. Simvastatin (20MG  Tablet, Oral daily) Active. Medications Reconciled  Social History Nance Pear, [place]; 07/21/2016 11:28 AM) Alcohol use  Moderate alcohol use. Caffeine use  Coffee. No drug use  Tobacco use  Former smoker.  Family History Nance Pear, [place]; 07/21/2016 11:28 AM) Family history unknown  First Degree Relatives     Review of Systems Nance Pear CMA; 07/21/2016 11:28 AM) General Not Present- Appetite Loss, Chills, Fatigue, Fever, Night Sweats, Weight Gain and Weight Loss. Skin Present- New Lesions and Non-Healing Wounds. Not Present- Change in Wart/Mole, Dryness, Hives, Jaundice, Rash and Ulcer. HEENT Present- Hearing Loss, Ringing in the Ears and Wears glasses/contact lenses. Not Present- Earache, Hoarseness, Nose Bleed, Oral Ulcers, Seasonal Allergies, Sinus Pain, Sore Throat, Visual  Disturbances and Yellow Eyes. Respiratory Present- Snoring. Not Present- Bloody sputum, Chronic Cough, Difficulty Breathing and Wheezing. Breast Not Present- Breast Mass, Breast Pain, Nipple Discharge and Skin Changes. Cardiovascular Present- Shortness of Breath. Not Present- Chest Pain, Difficulty Breathing Lying Down, Leg Cramps, Palpitations, Rapid Heart Rate and Swelling of Extremities. Gastrointestinal Not Present- Abdominal Pain, Bloating, Bloody Stool, Change in Bowel Habits, Chronic diarrhea, Constipation, Difficulty Swallowing, Excessive gas, Gets full quickly at meals, Hemorrhoids, Indigestion, Nausea, Rectal Pain and Vomiting. Male Genitourinary Present- Change in Urinary Stream, Frequency and Nocturia. Not Present- Blood in Urine, Impotence, Painful Urination, Urgency and Urine Leakage.  Vitals Bary Castilla Bradford CMA; 07/21/2016 11:32 AM) 07/21/2016 11:31 AM Weight: 131.4 lb Height: 68in Body Surface Area: 1.71 m Body Mass Index: 19.98 kg/m  Temp.: 97.59F  Pulse: 64 (Regular)  BP: 128/68 (Sitting, Left Arm, Standard) BP a bit high today, may be related to anxiety, 179/63      Physical Exam Jeneen Rinks O. Hulen Skains MD; 07/21/2016 11:50 AM) Musculoskeletal:  Lesions are covered today.with bandages, but on left shoulder, left forearm and left wrist.   Will excise with margin  Assessment & Plan Jeneen Rinks O. Damira Kem MD; 07/21/2016 11:53 AM) MASS OF SKIN OF SHOULDER, LEFT (R22.32) Story: Has been present since patient fell several months ago. Impression: Exuberant granulation tissue on stalk. 2 cm base, 5-6 cm top FOREARM MASS, LEFT (R22.32) Impression: Subcutaneous mass, likely related to previous fall. mobile with healed skin. MASS OF WRIST, LEFT (R22.32) Impression: Again looks like non-healing granulation area EXUBERANT GRANULATION TISSUE (L92.9) Impression: BOth the left shoulder and left wrist lesions appear to be this.  Lateral position.  Kathryne Eriksson. Dahlia Bailiff, MD,  [place] [phone] [facility]

## 2016-08-11 NOTE — Op Note (Signed)
OPERATIVE REPORT  DATE OF OPERATION: 08/11/2016  PATIENT:  Raymond Swanson  82 y.o. male  PRE-OPERATIVE DIAGNOSIS:  Non-healing lesion of left shoulder, left forearm, left wrist  POST-OPERATIVE DIAGNOSIS:  Non-healing lesion of left shoulder, left forearm, left wrist  INDICATION(S) FOR OPERATION:  Exophytic lesions of left shoulder (5.0 cm), left forearm (4.0 cm) and left wrist (2.0 cm)  FINDINGS:  Exophytic lesions of the areas identified.  PROCEDURE:  Procedure(s): EXCISION MASS LEFT SHOULDER, LEFT FOREARM, LEFT WRIST  SURGEON:  Surgeon(s): Judeth Horn, MD  ASSISTANT: None  ANESTHESIA:   general  COMPLICATIONS:  None  EBL: 30 ml  BLOOD ADMINISTERED: none  DRAINS: none   SPECIMEN:  Source of Specimen:  Oval skin and lesion from left shoulder with anterior marked with long stitch, superior with short stitch, left forearm oval skin and lesion  with distal long stitch and radial short stitch, and left srist oval skin and lesion, distal long and radial short  COUNTS CORRECT:  YES  PROCEDURE DETAILS: The patient was taken to the operating room and placed initially on the table in supine position. After an adequate general endotracheal anesthetic was administered he was placed in the right lateral decubitus position with the left arm slightly flexed on a long board above the table.  A proper timeout was performed identifying the patient and procedure to be performed. The patient was prepped and draped in usual sterile manner. Initial lesion excised with the left shoulder lesion which measured approximately 5 cm in a circular dimension. Margins were taken along with this lesion ending up with a suture line those approximately a 8.5 cm on taking 10 horizontal mattress sutures of 3-0 Nylon.  The left forearm lesion approximately 4 cm in size was taken with an oval incision removing it leaving a 7.5 cm scar with 10 stitches. These were also mixture of simple and horizontal mattress  sutures of 3-0 nylon.  The last lesion removed was the left wrist which is a 2 cm mass excised leaving the suture line 6 cm long was 7 horizontal mattress sutures. These with 3-0 nylon sutures.  Each incision was injected with half-and-half 0.5% Marcaine and 1% Xylocaine with epinephrine. All counts were correct sterile dressing was applied.  PATIENT DISPOSITION:  PACU - hemodynamically stable.   Warden Buffa 12/19/20172:09 PM

## 2016-08-11 NOTE — Transfer of Care (Signed)
Immediate Anesthesia Transfer of Care Note  Patient: Raymond Swanson  Procedure(s) Performed: Procedure(s): EXCISION MASS LEFT SHOULDER, LEFT FOREARM, LEFT WRIST (Left)  Patient Location: PACU  Anesthesia Type:General  Level of Consciousness: sedated  Airway & Oxygen Therapy: Patient Spontanous Breathing and Patient connected to nasal cannula oxygen  Post-op Assessment: Report given to RN and Post -op Vital signs reviewed and stable  Post vital signs: Reviewed and stable  Last Vitals:  Vitals:   08/11/16 0933 08/11/16 1436  BP: (!) 179/63 (!) 153/83  Pulse: (!) 5 (!) 117  Resp: 18 15  Temp: 36.4 C 36.3 C    Last Pain:  Vitals:   08/11/16 0933  TempSrc: Oral         Complications: No apparent anesthesia complications

## 2016-08-11 NOTE — Progress Notes (Signed)
Orthopedic Tech Progress Note Patient Details:  Raymond Swanson 22-Apr-1925 [id number]  Ortho Devices Type of Ortho Device: Arm sling Ortho Device/Splint Location: LUE Ortho Device/Splint Interventions: Ordered, Application   Braulio Bosch 08/11/2016, 4:27 PM

## 2016-08-11 NOTE — Anesthesia Procedure Notes (Signed)
Procedure Name: Intubation Date/Time: 08/11/2016 12:54 PM Performed by: Valda Favia Pre-anesthesia Checklist: Patient identified, Emergency Drugs available, Suction available, Patient being monitored and Timeout performed Patient Re-evaluated:Patient Re-evaluated prior to inductionOxygen Delivery Method: Circle system utilized Preoxygenation: Pre-oxygenation with 100% oxygen Intubation Type: IV induction Ventilation: Mask ventilation without difficulty Laryngoscope Size: Mac and 4 Grade View: Grade I Tube type: Oral Tube size: 7.5 mm Number of attempts: 1 Airway Equipment and Method: Stylet Placement Confirmation: ETT inserted through vocal cords under direct vision,  positive ETCO2 and breath sounds checked- equal and bilateral Secured at: 22 cm Tube secured with: Tape Dental Injury: Teeth and Oropharynx as per pre-operative assessment

## 2016-08-11 NOTE — Anesthesia Preprocedure Evaluation (Signed)
Anesthesia Evaluation  Patient identified by MRN, date of birth, ID band Patient awake    Reviewed: Allergy & Precautions, NPO status , Patient's Chart, lab work & pertinent test results  Airway Mallampati: II       Dental no notable dental hx. (+) Poor Dentition, Missing, Loose,    Pulmonary neg pulmonary ROS, former smoker,    Pulmonary exam normal        Cardiovascular hypertension, negative cardio ROS Normal cardiovascular exam     Neuro/Psych negative neurological ROS  negative psych ROS   GI/Hepatic negative GI ROS, Neg liver ROS,   Endo/Other  negative endocrine ROS  Renal/GU negative Renal ROS  negative genitourinary   Musculoskeletal negative musculoskeletal ROS (+)   Abdominal Normal abdominal exam  (+)   Peds negative pediatric ROS (+)  Hematology negative hematology ROS (+)   Anesthesia Other Findings   Reproductive/Obstetrics negative OB ROS                             Anesthesia Physical Anesthesia Plan  ASA: II  Anesthesia Plan: General   Post-op Pain Management:    Induction: Intravenous  Airway Management Planned: LMA  Additional Equipment:   Intra-op Plan:   Post-operative Plan:   Informed Consent: I have reviewed the patients History and Physical, chart, labs and discussed the procedure including the risks, benefits and alternatives for the proposed anesthesia with the patient or authorized representative who has indicated his/her understanding and acceptance.     Plan Discussed with: CRNA and Surgeon  Anesthesia Plan Comments:         Anesthesia Quick Evaluation

## 2016-08-11 NOTE — Discharge Instructions (Addendum)
Excision of Skin Lesions, Care After Introduction Refer to this sheet in the next few weeks. These instructions provide you with information about caring for yourself after your procedure. Your health care provider may also give you more specific instructions. Your treatment has been planned according to current medical practices, but problems sometimes occur. Call your health care provider if you have any problems or questions after your procedure. What can I expect after the procedure? After your procedure, it is common to have pain or discomfort at the excision site. Follow these instructions at home:  Take over-the-counter and prescription medicines only as told by your health care provider.  Follow instructions from your health care provider about:  How to take care of your excision site. You should keep the site clean, dry, and protected for at least 48 hours.  When and how you should change your bandage (dressing).  When you should remove your dressing.  Removing whatever was used to close your excision site.  Check the excision area every day for signs of infection. Watch for:  Redness, swelling, or pain.  Fluid, blood, or pus.  For bleeding, apply gentle but firm pressure to the area using a folded towel for 20 minutes.  Avoid high-impact exercise and activities until the stitches (sutures) are removed or the area heals.  Follow instructions from your health care provider about how to minimize scarring. Avoid sun exposure until the area has healed. Scarring should lessen over time.  Keep all follow-up visits as told by your health care provider. This is important. Contact a health care provider if:  You have a fever.  You have redness, swelling, or pain at the excision site.  You have fluid, blood, or pus coming from the excision site.  You have ongoing bleeding at the excision site.  You have pain that does not improve in 2-3 days after your procedure.  You  notice skin irregularities or changes in sensation. This information is not intended to replace advice given to you by your health care provider. Make sure you discuss any questions you have with your health care provider. Document Released: 12/25/2014 Document Revised: 01/16/2016 Document Reviewed: 09/26/2014  2017 [employer]

## 2016-08-12 ENCOUNTER — Encounter (HOSPITAL_COMMUNITY): Payer: Self-pay | Admitting: General Surgery

## 2016-08-25 ENCOUNTER — Other Ambulatory Visit: Payer: Self-pay | Admitting: Internal Medicine

## 2016-08-25 DIAGNOSIS — C4492 Squamous cell carcinoma of skin, unspecified: Secondary | ICD-10-CM

## 2016-09-17 ENCOUNTER — Encounter: Payer: Self-pay | Admitting: Hematology

## 2016-09-17 ENCOUNTER — Telehealth: Payer: Self-pay | Admitting: Hematology

## 2016-09-17 NOTE — Telephone Encounter (Signed)
Pt's wife returned call and scheduled appt for 02/01 @ 11am, verified demo and insurance, mailed pt letter, faxed referring provider appt date/time.

## 2016-09-24 ENCOUNTER — Encounter: Payer: Self-pay | Admitting: Hematology

## 2016-09-24 ENCOUNTER — Ambulatory Visit (HOSPITAL_BASED_OUTPATIENT_CLINIC_OR_DEPARTMENT_OTHER): Payer: Medicare Other | Admitting: Hematology

## 2016-09-24 VITALS — BP 160/76 | HR 66 | Temp 97.6°F | Resp 17 | Ht 68.0 in | Wt 133.7 lb

## 2016-09-24 DIAGNOSIS — C44629 Squamous cell carcinoma of skin of left upper limb, including shoulder: Secondary | ICD-10-CM

## 2016-09-24 DIAGNOSIS — Z87891 Personal history of nicotine dependence: Secondary | ICD-10-CM | POA: Diagnosis not present

## 2016-09-24 DIAGNOSIS — D049 Carcinoma in situ of skin, unspecified: Secondary | ICD-10-CM

## 2016-09-24 DIAGNOSIS — Z9181 History of falling: Secondary | ICD-10-CM

## 2016-09-24 DIAGNOSIS — Z801 Family history of malignant neoplasm of trachea, bronchus and lung: Secondary | ICD-10-CM

## 2016-09-24 DIAGNOSIS — D0462 Carcinoma in situ of skin of left upper limb, including shoulder: Secondary | ICD-10-CM

## 2016-09-24 DIAGNOSIS — Z8546 Personal history of malignant neoplasm of prostate: Secondary | ICD-10-CM

## 2016-09-24 NOTE — Progress Notes (Signed)
Marland Kitchen    HEMATOLOGY/ONCOLOGY CONSULTATION NOTE  Date of Service: 09/24/2016  Patient Care Team: Marletta Lor, MD as PCP - General Minus Breeding, MD (Cardiology) Judeth Horn MD (surgery)  CHIEF COMPLAINTS/PURPOSE OF CONSULTATION:  Multiple left Upper extremity cutaneous Squamous cell carcinomas  HISTORY OF PRESENTING ILLNESS:   Raymond Swanson is a wonderful 83 y.o. male who has been referred to [place] by Dr .Nyoka Cowden, MD  for evaluation and management of multiple left upper extremity cutaneous squamous cell carcinomas.  Patient has a history of hypertension, dyslipidemia, coronary artery disease, PUD , prostate cancer who was hospitalized in June 2017 after a mechanical fall resulting in multiple traumatic injuries including a SDH and SAH with cortical contusions. He notes that he also had significant trauma with laceration on his left upper extremity, fracture of multiple ribs on the left side, T8 and L1 vertebral fracture. Patient notes that since the traumatic incident to was noted to have non healing skin lesions on his left shoulder, left forearm and left wrist. He notes that the skin lesions on the left wrist was present even prior to the trauma and he isnt sure of the other 2 lesions. These skin lesions were gradually enlarging and were somewhat tender and were eventually resected by Dr.James Wyatt on 08/11/2016.  There were all noted to be squamous cell carcinomas. Pathology revealed that the lesions on the left forearm and left wrist were resected with negative margins , however the left shoulder lesion which showed a moderately to poor differented SCC showed neg margins on the main excision specimen but a separate unoriented piece of tissue showed tumor involving the edges.  Patient's wounds have healed well after and he currently has no open unhealed skin lesions. No palpable regional lymph nodes noted.  He was referred to [place] to determine further management of his  multiple cutaneous squamous cell carcinoma's. Patient is very clear that he would not want any additional surgical resection or radiation even if these were indicated.  He notes that he has a and underwent surgery in 01/2007 and has been getting followed up by his urologist Dr Hartley Barefoot.  Patient reports that his PSA level has been gradually increasing over the years and is up to 47 recently. He notes that he has had multiple imaging studies that have not demonstrated any overt evidence of measurable prostate cancer lesions. He reports that his urologist recommended no other additional evaluation at this time.  Patient notes he is functioning relatively well considering his age and still functions independently and lives with his wife.  Patient notes that he was previously followed by Dr Tonia Brooms (Dermatology) and has had skin lesions removed in the past which were not cancerous.  MEDICAL HISTORY:  Past Medical History:  Diagnosis Date  . ACUT GASTR ULCER W/HEMORR W/O MENTION OBST 09/10/2009  . ANEMIA 10/08/2009  . Cancer ([place])   . CORONARY ARTERY DISEASE 01/26/2007  . History of kidney stones   . HYPERLIPIDEMIA 01/26/2007  . HYPERTENSION 01/26/2007  . HYPOTHYROIDISM 10/18/2007  . MELENA 08/28/2009  . PROSTATE CANCER, HX OF 01/26/2007  . SKIN CANCER, HX OF 01/26/2007    SURGICAL HISTORY: Past Surgical History:  Procedure Laterality Date  . CATARACT EXTRACTION    . CORONARY ARTERY BYPASS GRAFT     1991  . EXCISION MASS UPPER EXTREMETIES Left 08/11/2016   Procedure: EXCISION MASS LEFT SHOULDER, LEFT FOREARM, LEFT WRIST;  Surgeon: Judeth Horn, MD;  Location: [place];  Service: General;  Laterality:  Left;  . HERNIA REPAIR     ingunial  . MOHS SURGERY    . PROSTATE SURGERY     prostatectomy    SOCIAL HISTORY: Social History   Social History  . Marital status: Married    Spouse name: N/A  . Number of children: N/A  . Years of education: N/A   Occupational History  . Not on file.   Social  History Main Topics  . Smoking status: Former Smoker    Packs/day: 1.00    Years: 20.00    Types: Cigarettes    Quit date: 10/28/1948  . Smokeless tobacco: Never Used  . Alcohol use Yes     Comment: 4 oz. wine daily  . Drug use: No  . Sexual activity: Not on file   Other Topics Concern  . Not on file   Social History Narrative  . No narrative on file    FAMILY HISTORY: Family History  Problem Relation Age of Onset  . Lung cancer Brother 25  . GI problems Sister     ALLERGIES:  has No Known Allergies.  MEDICATIONS:  Current Outpatient Prescriptions  Medication Sig Dispense Refill  . acetaminophen (TYLENOL) 325 MG tablet Take 1 tablet (325 mg total) by mouth every 6 (six) hours as needed. (Patient taking differently: Take 325 mg by mouth every 6 (six) hours as needed (for pain/fever.). )    . calcium gluconate 500 MG tablet Take 500 mg by mouth daily.      . carboxymethylcellulose (REFRESH TEARS) 0.5 % SOLN Place 1 drop into both eyes 3 (three) times daily as needed (for dry/irritated eyes).    . cholecalciferol (VITAMIN D) 1000 UNITS tablet Take 1,000 Units by mouth daily.      Marland Kitchen docusate sodium (COLACE) 100 MG capsule Take 1 capsule (100 mg total) by mouth 2 (two) times daily. 60 capsule 0  . famotidine (PEPCID) 20 MG tablet Take 1 tablet (20 mg total) by mouth 2 (two) times daily. 60 tablet 0  . levothyroxine (SYNTHROID, LEVOTHROID) 50 MCG tablet Take 1 tablet (50 mcg total) by mouth daily. 90 tablet 3  . Melatonin 5 MG TABS Take 2.5 mg by mouth at bedtime.    . Multiple Vitamins-Minerals (PRESERVISION/LUTEIN PO) Take 1 tablet by mouth daily. Daily       . nitroGLYCERIN (NITROSTAT) 0.4 MG SL tablet Place 1 tablet (0.4 mg total) under the tongue every 5 (five) minutes as needed. (Patient taking differently: Place 0.4 mg under the tongue every 5 (five) minutes as needed for chest pain. ) 20 tablet 5  . polyethylene glycol (MIRALAX / GLYCOLAX) packet Take 17 g by mouth 2 (two)  times daily. 60 each 0  . simvastatin (ZOCOR) 20 MG tablet Take 1 tablet (20 mg total) by mouth daily. (Patient taking differently: Take 20 mg by mouth every evening. ) 90 tablet 3  . traMADol (ULTRAM) 50 MG tablet Take 1-2 tablets (50-100 mg total) by mouth every 6 (six) hours as needed for moderate pain or severe pain. 40 tablet 0  . traZODone (DESYREL) 50 MG tablet Take 0.5 tablets (25 mg total) by mouth at bedtime as needed for sleep. 30 tablet 2   No current facility-administered medications for this visit.     REVIEW OF SYSTEMS:    10 Point review of Systems was done is negative except as noted above.  PHYSICAL EXAMINATION: ECOG PERFORMANCE STATUS: 2 - Symptomatic, <50% confined to bed  . Vitals:   09/24/16 1117  BP: (!) 160/76  Pulse: 66  Resp: 17  Temp: 97.6 F (36.4 C)   Filed Weights   09/24/16 1117  Weight: 133 lb 11.2 oz (60.6 kg)   .Body mass index is 20.33 kg/m.  GENERAL:alert, in no acute distress and comfortable SKIN: healed surgical incision on left shoulder left forearm and wrist. He notes a couple of scaly lesions on his back and several other skin findings which he notes have not recently changed. EYES: normal, conjunctiva are pink and non-injected, sclera clear OROPHARYNX:no exudate, no erythema and lips, buccal mucosa, and tongue normal  NECK: supple, no JVD, thyroid normal size, non-tender, without nodularity LYMPH:  no palpable lymphadenopathy in the cervical, axillary or inguinal LUNGS: clear to auscultation with normal respiratory effort HEART: regular rate & rhythm ABDOMEN: abdomen soft, non-tender, normoactive bowel sounds , no palpable hepato-splenomegaly Musculoskeletal: no cyanosis of digits and no clubbing  PSYCH: alert & oriented x 3 with fluent speech NEURO: no focal motor/sensory deficits  LABORATORY DATA:  I have reviewed the data as listed  . CBC Latest Ref Rng & Units 08/10/2016 02/07/2016 01/28/2016  WBC 4.0 - 10.5 K/uL 9.6 8.9 9.2    Hemoglobin 13.0 - 17.0 g/dL 14.5 11.9(L) 11.1(L)  Hematocrit 39.0 - 52.0 % 44.5 37.2(L) 34.9(L)  Platelets 150 - 400 K/uL 200 425(H) 196    . CMP Latest Ref Rng & Units 08/10/2016 02/07/2016 02/04/2016  Glucose 65 - 99 mg/dL 105(H) 93 102(H)  BUN 6 - 20 mg/dL 17 15 23(H)  Creatinine 0.61 - 1.24 mg/dL 1.04 0.91 1.14  Sodium 135 - 145 mmol/L 143 141 140  Potassium 3.5 - 5.1 mmol/L 3.8 4.2 4.5  Chloride 101 - 111 mmol/L 106 108 106  CO2 22 - 32 mmol/L 30 25 29   Calcium 8.9 - 10.3 mg/dL 9.5 9.8 9.2  Total Protein 6.5 - 8.1 g/dL - - -  Total Bilirubin 0.3 - 1.2 mg/dL - - -  Alkaline Phos 38 - 126 U/L - - -  AST 15 - 41 U/L - - -  ALT 17 - 63 U/L - - -       RADIOGRAPHIC STUDIES: I have personally reviewed the radiological images as listed and agreed with the findings in the report. No results found.  ASSESSMENT & PLAN:   83 yo with multiple medical co-morbids with   1) Multiple Cutaneous Squamous cell Carcinoma's (left shoulder, left forearm and left wrist as per pathology report noted above). These were possibly present even prior to his fall with trauma to his left upper extremity but were noticeably more prominent, non healing and painful after his trauma in June 2017. These were resected on 08/11/2016 and his skin has healed with no overt grossly noticeable residual lesions are the surgical sites.  Lesions on the left forearm and left wrist were resected with free margins. Some concern for residual tumor at the edges of the resected left shoulder lesions (in a separate no oriented piece of tissue sent for pathology)  PLAN -I discussed the pathology findings, diagnosis and general principles of treatment with the patient in details and answered all his questions. -we discussed that any positive resection margins may be treated with additional resection of the area or post-operative radiation therapy to the area to reduce the  Risk of local recurrence at the site of his left  shoulder lesion. -Patient notes he understands this but would like to decline any additional surgery or adjuvant radiation therapy. His informed wishes are respected. (  this is a reasonable choice considering the patient age and other health priorities) -no indication for adjuvant chemotherapy. -I would recommend f/u with dermatology for ongoing skin screening and evaluation/management of other concerning skin lesions and to monitor for local recurrence of his SCC at the excision site especially over left shoulder. -I discussed this with the patient and he prefers the referral to come from his PCP. (Has previously been seen by Dr Tonia Brooms) -patient was very thankful for a detailed and frank discussion.  2) h/o Prostate cancer s/p surgery with biochemical recurrence.  He notes that his last PSA level was 58 with no NED on imaging studies Plan -continue f/u with urology (Dr Gaynelle Arabian)  RTC with .Nyoka Cowden, MD Kindly let us know if any new questions or concerns arise.  All of the patients questions were answered with apparent satisfaction. The patient knows to call the clinic with any problems, questions or concerns.  I spent 60 minutes counseling the patient face to face. The total time spent in the appointment was 80 minutes and more than 50% was on counseling and direct patient cares.    Sullivan Lone MD [place] AAHIVMS [facility] Hematology/Oncology Physician [facility]  (Office):       [phone] (Work cell):  [phone] (Fax):           [phone]  09/24/2016 11:47 AM

## 2016-11-04 ENCOUNTER — Encounter (INDEPENDENT_AMBULATORY_CARE_PROVIDER_SITE_OTHER): Payer: Medicare Other | Admitting: Ophthalmology

## 2016-11-04 DIAGNOSIS — H35033 Hypertensive retinopathy, bilateral: Secondary | ICD-10-CM

## 2016-11-04 DIAGNOSIS — H43813 Vitreous degeneration, bilateral: Secondary | ICD-10-CM | POA: Diagnosis not present

## 2016-11-04 DIAGNOSIS — H353221 Exudative age-related macular degeneration, left eye, with active choroidal neovascularization: Secondary | ICD-10-CM

## 2016-11-04 DIAGNOSIS — I10 Essential (primary) hypertension: Secondary | ICD-10-CM

## 2016-11-04 DIAGNOSIS — H353112 Nonexudative age-related macular degeneration, right eye, intermediate dry stage: Secondary | ICD-10-CM | POA: Diagnosis not present

## 2016-11-10 ENCOUNTER — Encounter: Payer: Self-pay | Admitting: Cardiology

## 2016-11-18 NOTE — Progress Notes (Signed)
HPI The patient presents for follow up of CAD.  Since I last saw him he was in the hospital of last year.  He had a fall and SAH after cleaning his gutters.  Since discharge he has done well.  The patient denies any new symptoms such as chest discomfort, neck or arm discomfort. There has been no new shortness of breath, PND or orthopnea. There have been no reported palpitations, presyncope or syncope.  He is just weak.     No Known Allergies  Current Outpatient Prescriptions  Medication Sig Dispense Refill  . acetaminophen (TYLENOL) 325 MG tablet Take 1 tablet (325 mg total) by mouth every 6 (six) hours as needed. (Patient taking differently: Take 325 mg by mouth every 6 (six) hours as needed (for pain/fever.). )    . calcium gluconate 500 MG tablet Take 500 mg by mouth daily.      . carboxymethylcellulose (REFRESH TEARS) 0.5 % SOLN Place 1 drop into both eyes 3 (three) times daily as needed (for dry/irritated eyes).    . cholecalciferol (VITAMIN D) 1000 UNITS tablet Take 1,000 Units by mouth daily.      Marland Kitchen docusate sodium (COLACE) 100 MG capsule Take 1 capsule (100 mg total) by mouth 2 (two) times daily. 60 capsule 0  . famotidine (PEPCID) 20 MG tablet Take 1 tablet (20 mg total) by mouth 2 (two) times daily. 60 tablet 0  . levothyroxine (SYNTHROID, LEVOTHROID) 50 MCG tablet Take 1 tablet (50 mcg total) by mouth daily. 90 tablet 3  . Melatonin 5 MG TABS Take 2.5 mg by mouth at bedtime.    . Multiple Vitamins-Minerals (PRESERVISION/LUTEIN PO) Take 1 tablet by mouth daily. Daily       . nitroGLYCERIN (NITROSTAT) 0.4 MG SL tablet Place 1 tablet (0.4 mg total) under the tongue every 5 (five) minutes as needed. (Patient taking differently: Place 0.4 mg under the tongue every 5 (five) minutes as needed for chest pain. ) 20 tablet 5  . polyethylene glycol (MIRALAX / GLYCOLAX) packet Take 17 g by mouth 2 (two) times daily. 60 each 0  . simvastatin (ZOCOR) 20 MG tablet Take 1 tablet (20 mg total) by  mouth daily. (Patient taking differently: Take 20 mg by mouth every evening. ) 90 tablet 3  . traZODone (DESYREL) 50 MG tablet Take 0.5 tablets (25 mg total) by mouth at bedtime as needed for sleep. 30 tablet 2   No current facility-administered medications for this visit.     Past Medical History:  Diagnosis Date  . ACUT GASTR ULCER W/HEMORR W/O MENTION OBST 09/10/2009  . ANEMIA 10/08/2009  . Cancer ([place])   . CORONARY ARTERY DISEASE 01/26/2007  . History of kidney stones   . HYPERLIPIDEMIA 01/26/2007  . HYPERTENSION 01/26/2007  . HYPOTHYROIDISM 10/18/2007  . MELENA 08/28/2009  . PROSTATE CANCER, HX OF 01/26/2007  . SKIN CANCER, HX OF 01/26/2007    Past Surgical History:  Procedure Laterality Date  . CATARACT EXTRACTION    . CORONARY ARTERY BYPASS GRAFT     1991  . EXCISION MASS UPPER EXTREMETIES Left 08/11/2016   Procedure: EXCISION MASS LEFT SHOULDER, LEFT FOREARM, LEFT WRIST;  Surgeon: Judeth Horn, MD;  Location: [place];  Service: General;  Laterality: Left;  . HERNIA REPAIR     ingunial  . MOHS SURGERY    . PROSTATE SURGERY     prostatectomy    ROS:  Hard of hearing.  Otherwise as here in stated in the HPI and negative  for all other systems.  PHYSICAL EXAM BP (!) 148/76   Pulse 66   Ht 5\' 6"  (1.676 m)   Wt 132 lb 6.4 oz (60.1 kg)   BMI 21.37 kg/m  GENERAL:  Well appearing HEENT:  Pupils equal round and reactive, fundi not visualized, oral mucosa unremarkable NECK:  No jugular venous distention, waveform within normal limits, carotid upstroke brisk and symmetric, bilateral bruits, no thyromegaly LUNGS:  Clear to auscultation bilaterally BACK:  No CVA tenderness CHEST:  Well healed sternotomy scar. HEART:  PMI not displaced or sustained,S1 and S2 within normal limits, no S3, no S4, no clicks, no rubs, no murmurs ABD:  Flat, positive bowel sounds normal in frequency in pitch, no bruits, no rebound, no guarding, no midline pulsatile mass, no hepatomegaly, no splenomegaly EXT:  2  plus pulses throughout, no edema, no cyanosis no clubbing     Lab Results  Component Value Date   CHOL 143 12/24/2015   TRIG 85.0 12/24/2015   HDL 54.80 12/24/2015   LDLCALC 71 12/24/2015    ASSESSMENT AND PLAN  CORONARY ARTERY DISEASE -  The patient has no new sypmtoms.  No further cardiovascular testing is indicated.  We will continue with aggressive risk reduction and meds as listed.  HYPERTENSION -  The blood pressure is at target. No change in medications is indicated. We will continue with therapeutic lifestyle changes (TLC).   HYPERLIPIDEMIA -  His lipids were at goal.  I will defer repeat labs to Nyoka Cowden, MD  BRUITS - I will check carotid dopplers today.

## 2016-11-19 ENCOUNTER — Ambulatory Visit (HOSPITAL_COMMUNITY)
Admission: RE | Admit: 2016-11-19 | Discharge: 2016-11-19 | Disposition: A | Discharge status: Home / Self Care | Payer: Medicare Other | Source: Ambulatory Visit | Attending: Cardiovascular Disease | Admitting: Cardiovascular Disease

## 2016-11-19 ENCOUNTER — Ambulatory Visit (INDEPENDENT_AMBULATORY_CARE_PROVIDER_SITE_OTHER): Payer: Medicare Other | Admitting: Cardiology

## 2016-11-19 VITALS — BP 148/76 | HR 66 | Ht 66.0 in | Wt 132.4 lb

## 2016-11-19 DIAGNOSIS — E785 Hyperlipidemia, unspecified: Secondary | ICD-10-CM | POA: Diagnosis not present

## 2016-11-19 DIAGNOSIS — R0989 Other specified symptoms and signs involving the circulatory and respiratory systems: Secondary | ICD-10-CM

## 2016-11-19 DIAGNOSIS — I2510 Atherosclerotic heart disease of native coronary artery without angina pectoris: Secondary | ICD-10-CM | POA: Insufficient documentation

## 2016-11-19 DIAGNOSIS — Z951 Presence of aortocoronary bypass graft: Secondary | ICD-10-CM | POA: Diagnosis not present

## 2016-11-19 DIAGNOSIS — I6523 Occlusion and stenosis of bilateral carotid arteries: Secondary | ICD-10-CM | POA: Insufficient documentation

## 2016-11-19 DIAGNOSIS — I10 Essential (primary) hypertension: Secondary | ICD-10-CM | POA: Diagnosis not present

## 2016-11-19 DIAGNOSIS — Z87891 Personal history of nicotine dependence: Secondary | ICD-10-CM | POA: Insufficient documentation

## 2016-11-19 NOTE — Patient Instructions (Signed)
Medication Instructions:  Continue current medication  Labwork: None Ordered  Testing/Procedures: Your physician has requested that you have a carotid duplex. This test is an ultrasound of the carotid arteries in your neck. It looks at blood flow through these arteries that supply the brain with blood. Allow one hour for this exam. There are no restrictions or special instructions.  Follow-Up: Your physician wants you to follow-up in: 1 Year. You will receive a reminder letter in the mail two months in advance. If you don't receive a letter, please call our office to schedule the follow-up appointment.  Any Other Special Instructions Will Be Listed Below (If Applicable).   If you need a refill on your cardiac medications before your next appointment, please call your pharmacy.

## 2016-11-20 ENCOUNTER — Encounter: Payer: Self-pay | Admitting: Cardiology

## 2016-12-22 ENCOUNTER — Encounter: Payer: Self-pay | Admitting: Internal Medicine

## 2016-12-22 ENCOUNTER — Ambulatory Visit (INDEPENDENT_AMBULATORY_CARE_PROVIDER_SITE_OTHER): Payer: Medicare Other | Admitting: Internal Medicine

## 2016-12-22 VITALS — BP 118/68 | HR 79 | Temp 97.9°F | Ht 66.0 in | Wt 135.8 lb

## 2016-12-22 DIAGNOSIS — I10 Essential (primary) hypertension: Secondary | ICD-10-CM | POA: Diagnosis not present

## 2016-12-22 DIAGNOSIS — Z85828 Personal history of other malignant neoplasm of skin: Secondary | ICD-10-CM

## 2016-12-22 DIAGNOSIS — E038 Other specified hypothyroidism: Secondary | ICD-10-CM

## 2016-12-22 DIAGNOSIS — C4492 Squamous cell carcinoma of skin, unspecified: Secondary | ICD-10-CM

## 2016-12-22 DIAGNOSIS — G479 Sleep disorder, unspecified: Secondary | ICD-10-CM

## 2016-12-22 DIAGNOSIS — C800 Disseminated malignant neoplasm, unspecified: Secondary | ICD-10-CM | POA: Diagnosis not present

## 2016-12-22 MED ORDER — TRAZODONE HCL 50 MG PO TABS: 50.0000 mg | ORAL_TABLET | Freq: Every evening | ORAL | 2 refills | Status: DC | PRN

## 2016-12-22 NOTE — Progress Notes (Signed)
Subjective:    Patient ID: Raymond Swanson, male    DOB: July 06, 1925, 83 y.o.   MRN: [id number]  HPI 83 year old patient who is seen today for his six-month follow-up. He was hospitalized for multiple trauma including TBI and subdural hematoma in June of last year.  He states that he has had significant insomnia since that time.  He has a difficult time falling asleep and especially staying asleep for any meaningful interval.  He awakens and reads before going back to sleep.  He falls asleep easily throughout the day while watching television, reading or in front of his computer. He has had no recent falls.  He does use his cane about 20% of the time. He has been using melatonin.  More recently and did not respond well to low dose trazodone.  He has been seen by oncology due to multiple squamous cell skin cancer involving his left shoulder and arm areas.  These were resected by general surgery with clean margins except for possible residual disease involving a lesion involving the left shoulder area.  He is agreeable for dermatology follow-up  He has essential hypertension which has been stable.  He has been seen by cardiology due to coronary artery disease and has had a follow-up carotid artery Doppler study performed Denies any exertional chest pain or focal neurological symptoms  Past Medical History:  Diagnosis Date  . ACUT GASTR ULCER W/HEMORR W/O MENTION OBST 09/10/2009  . ANEMIA 10/08/2009  . Cancer ([place])   . CORONARY ARTERY DISEASE 01/26/2007  . History of kidney stones   . HYPERLIPIDEMIA 01/26/2007  . HYPERTENSION 01/26/2007  . HYPOTHYROIDISM 10/18/2007  . MELENA 08/28/2009  . PROSTATE CANCER, HX OF 01/26/2007  . SKIN CANCER, HX OF 01/26/2007     Social History   Social History  . Marital status: Married    Spouse name: N/A  . Number of children: N/A  . Years of education: N/A   Occupational History  . Not on file.   Social History Main Topics  . Smoking status: Former Smoker   Packs/day: 1.00    Years: 20.00    Types: Cigarettes    Quit date: 10/28/1948  . Smokeless tobacco: Never Used  . Alcohol use Yes     Comment: 4 oz. wine daily  . Drug use: No  . Sexual activity: Not on file   Other Topics Concern  . Not on file   Social History Narrative  . No narrative on file    Past Surgical History:  Procedure Laterality Date  . CATARACT EXTRACTION    . CORONARY ARTERY BYPASS GRAFT     1991  . EXCISION MASS UPPER EXTREMETIES Left 08/11/2016   Procedure: EXCISION MASS LEFT SHOULDER, LEFT FOREARM, LEFT WRIST;  Surgeon: Judeth Horn, MD;  Location: [place];  Service: General;  Laterality: Left;  . HERNIA REPAIR     ingunial  . MOHS SURGERY    . PROSTATE SURGERY     prostatectomy    Family History  Problem Relation Age of Onset  . Lung cancer Brother 29  . GI problems Sister     No Known Allergies  Current Outpatient Prescriptions on File Prior to Visit  Medication Sig Dispense Refill  . acetaminophen (TYLENOL) 325 MG tablet Take 1 tablet (325 mg total) by mouth every 6 (six) hours as needed. (Patient taking differently: Take 325 mg by mouth every 6 (six) hours as needed (for pain/fever.). )    . calcium gluconate 500  MG tablet Take 500 mg by mouth daily.      . carboxymethylcellulose (REFRESH TEARS) 0.5 % SOLN Place 1 drop into both eyes 3 (three) times daily as needed (for dry/irritated eyes).    . cholecalciferol (VITAMIN D) 1000 UNITS tablet Take 1,000 Units by mouth daily.      Marland Kitchen docusate sodium (COLACE) 100 MG capsule Take 1 capsule (100 mg total) by mouth 2 (two) times daily. 60 capsule 0  . famotidine (PEPCID) 20 MG tablet Take 1 tablet (20 mg total) by mouth 2 (two) times daily. 60 tablet 0  . levothyroxine (SYNTHROID, LEVOTHROID) 50 MCG tablet Take 1 tablet (50 mcg total) by mouth daily. 90 tablet 3  . Melatonin 5 MG TABS Take 2.5 mg by mouth at bedtime.    . Multiple Vitamins-Minerals (PRESERVISION/LUTEIN PO) Take 1 tablet by mouth daily.  Daily       . nitroGLYCERIN (NITROSTAT) 0.4 MG SL tablet Place 1 tablet (0.4 mg total) under the tongue every 5 (five) minutes as needed. (Patient taking differently: Place 0.4 mg under the tongue every 5 (five) minutes as needed for chest pain. ) 20 tablet 5  . polyethylene glycol (MIRALAX / GLYCOLAX) packet Take 17 g by mouth 2 (two) times daily. 60 each 0  . simvastatin (ZOCOR) 20 MG tablet Take 1 tablet (20 mg total) by mouth daily. (Patient taking differently: Take 20 mg by mouth every evening. ) 90 tablet 3   No current facility-administered medications on file prior to visit.     BP 118/68 (BP Location: Left Arm, Patient Position: Sitting, Cuff Size: Normal)   Pulse 79   Temp 97.9 F (36.6 C) (Oral)   Ht 5\' 6"  (1.676 m)   Wt 135 lb 12.8 oz (61.6 kg)   SpO2 97%   BMI 21.92 kg/m      Review of Systems  Constitutional: Negative for appetite change, chills, fatigue and fever.  HENT: Negative for congestion, dental problem, ear pain, hearing loss, sore throat, tinnitus, trouble swallowing and voice change.   Eyes: Negative for pain, discharge and visual disturbance.  Respiratory: Negative for cough, chest tightness, wheezing and stridor.   Cardiovascular: Negative for chest pain, palpitations and leg swelling.  Gastrointestinal: Negative for abdominal distention, abdominal pain, blood in stool, constipation, diarrhea, nausea and vomiting.  Genitourinary: Negative for difficulty urinating, discharge, flank pain, genital sores, hematuria and urgency.  Musculoskeletal: Positive for gait problem. Negative for arthralgias, back pain, joint swelling, myalgias and neck stiffness.  Skin: Positive for wound. Negative for rash.  Neurological: Negative for dizziness, syncope, speech difficulty, weakness, numbness and headaches.  Hematological: Negative for adenopathy. Does not bruise/bleed easily.  Psychiatric/Behavioral: Positive for sleep disturbance. Negative for behavioral problems and  dysphoric mood. The patient is not nervous/anxious.        Objective:   Physical Exam  Constitutional: He is oriented to person, place, and time. He appears well-developed. No distress.  HENT:  Head: Normocephalic.  Right Ear: External ear normal.  Left Ear: External ear normal.  Eyes: Conjunctivae and EOM are normal.  Neck: Normal range of motion.  Cardiovascular: Normal rate and normal heart sounds.   Pulmonary/Chest: Breath sounds normal.  Abdominal: Bowel sounds are normal.  Musculoskeletal: Normal range of motion. He exhibits no edema or tenderness.  Neurological: He is alert and oriented to person, place, and time.  Skin:  Incisions involving his left shoulder and left lower arm were examined.  No obvious reoccurrence  Psychiatric: He has a  normal mood and affect. His behavior is normal.          Assessment & Plan:   Insomnia.  Sleep hygiene issues discussed.  He will continue melatonin and increase trazodone to 25 mg at bedtime Hypothyroidism.  Continue levothyroxine supplement Status post multiple trauma including traumatic brain syndrome, stable  History of multiple squamous cell skin cancers, left arm.  Patient agreeable for dermatology follow-up.  We'll schedule  Return here in 3-6 months  Nyoka Cowden

## 2016-12-22 NOTE — Patient Instructions (Addendum)
Dermatology consultation as discussed  Insomnia Insomnia is a sleep disorder that makes it difficult to fall asleep or to stay asleep. Insomnia can cause tiredness (fatigue), low energy, difficulty concentrating, mood swings, and poor performance at work or school. There are three different ways to classify insomnia:  Difficulty falling asleep.  Difficulty staying asleep.  Waking up too early in the morning. Any type of insomnia can be long-term (chronic) or short-term (acute). Both are common. Short-term insomnia usually lasts for three months or less. Chronic insomnia occurs at least three times a week for longer than three months. What are the causes? Insomnia may be caused by another condition, situation, or substance, such as:  Anxiety.  Certain medicines.  Gastroesophageal reflux disease (GERD) or other gastrointestinal conditions.  Asthma or other breathing conditions.  Restless legs syndrome, sleep apnea, or other sleep disorders.  Chronic pain.  Menopause. This may include hot flashes.  Stroke.  Abuse of alcohol, tobacco, or illegal drugs.  Depression.  Caffeine.  Neurological disorders, such as Alzheimer disease.  An overactive thyroid (hyperthyroidism). The cause of insomnia may not be known. What increases the risk? Risk factors for insomnia include:  Gender. Women are more commonly affected than men.  Age. Insomnia is more common as you get older.  Stress. This may involve your professional or personal life.  Income. Insomnia is more common in people with lower income.  Lack of exercise.  Irregular work schedule or night shifts.  Traveling between different time zones. What are the signs or symptoms? If you have insomnia, trouble falling asleep or trouble staying asleep is the main symptom. This may lead to other symptoms, such as:  Feeling fatigued.  Feeling nervous about going to sleep.  Not feeling rested in the morning.  Having  trouble concentrating.  Feeling irritable, anxious, or depressed. How is this treated? Treatment for insomnia depends on the cause. If your insomnia is caused by an underlying condition, treatment will focus on addressing the condition. Treatment may also include:  Medicines to help you sleep.  Counseling or therapy.  Lifestyle adjustments. Follow these instructions at home:  Take medicines only as directed by your health care provider.  Keep regular sleeping and waking hours. Avoid naps.  Keep a sleep diary to help you and your health care provider figure out what could be causing your insomnia. Include:  When you sleep.  When you wake up during the night.  How well you sleep.  How rested you feel the next day.  Any side effects of medicines you are taking.  What you eat and drink.  Make your bedroom a comfortable place where it is easy to fall asleep:  Put up shades or special blackout curtains to block light from outside.  Use a white noise machine to block noise.  Keep the temperature cool.  Exercise regularly as directed by your health care provider. Avoid exercising right before bedtime.  Use relaxation techniques to manage stress. Ask your health care provider to suggest some techniques that may work well for you. These may include:  Breathing exercises.  Routines to release muscle tension.  Visualizing peaceful scenes.  Cut back on alcohol, caffeinated beverages, and cigarettes, especially close to bedtime. These can disrupt your sleep.  Do not overeat or eat spicy foods right before bedtime. This can lead to digestive discomfort that can make it hard for you to sleep.  Limit screen use before bedtime. This includes:  Watching TV.  Using your smartphone, tablet, and  computer.  Stick to a routine. This can help you fall asleep faster. Try to do a quiet activity, brush your teeth, and go to bed at the same time each night.  Get out of bed if you are  still awake after 15 minutes of trying to sleep. Keep the lights down, but try reading or doing a quiet activity. When you feel sleepy, go back to bed.  Make sure that you drive carefully. Avoid driving if you feel very sleepy.  Keep all follow-up appointments as directed by your health care provider. This is important. Contact a health care provider if:  You are tired throughout the day or have trouble in your daily routine due to sleepiness.  You continue to have sleep problems or your sleep problems get worse. Get help right away if:  You have serious thoughts about hurting yourself or someone else. This information is not intended to replace advice given to you by your health care provider. Make sure you discuss any questions you have with your health care provider. Document Released: 08/07/2000 Document Revised: 01/10/2016 Document Reviewed: 05/11/2014 Elsevier Interactive Patient Education  2017 [employer].

## 2016-12-22 NOTE — Progress Notes (Signed)
Pre visit review using our clinic review tool, if applicable. No additional management support is needed unless otherwise documented below in the visit note. 

## 2017-02-11 ENCOUNTER — Other Ambulatory Visit: Payer: Self-pay | Admitting: Internal Medicine

## 2017-02-22 ENCOUNTER — Encounter: Payer: Self-pay | Admitting: Internal Medicine

## 2017-02-22 ENCOUNTER — Ambulatory Visit (INDEPENDENT_AMBULATORY_CARE_PROVIDER_SITE_OTHER): Payer: Medicare Other | Admitting: Internal Medicine

## 2017-02-22 VITALS — BP 152/62 | HR 80 | Temp 97.6°F | Ht 66.0 in | Wt 132.6 lb

## 2017-02-22 DIAGNOSIS — I10 Essential (primary) hypertension: Secondary | ICD-10-CM

## 2017-02-22 DIAGNOSIS — I2510 Atherosclerotic heart disease of native coronary artery without angina pectoris: Secondary | ICD-10-CM

## 2017-02-22 DIAGNOSIS — E785 Hyperlipidemia, unspecified: Secondary | ICD-10-CM | POA: Diagnosis not present

## 2017-02-22 DIAGNOSIS — D500 Iron deficiency anemia secondary to blood loss (chronic): Secondary | ICD-10-CM

## 2017-02-22 DIAGNOSIS — E039 Hypothyroidism, unspecified: Secondary | ICD-10-CM

## 2017-02-22 DIAGNOSIS — Z0000 Encounter for general adult medical examination without abnormal findings: Secondary | ICD-10-CM

## 2017-02-22 LAB — CBC WITH DIFFERENTIAL/PLATELET
BASOS PCT: 0.3 % (ref 0.0–3.0)
Basophils Absolute: 0 10*3/uL (ref 0.0–0.1)
EOS ABS: 0.1 10*3/uL (ref 0.0–0.7)
EOS PCT: 1.1 % (ref 0.0–5.0)
HEMATOCRIT: 40.2 % (ref 39.0–52.0)
HEMOGLOBIN: 13.5 g/dL (ref 13.0–17.0)
LYMPHS PCT: 11.3 % — AB (ref 12.0–46.0)
Lymphs Abs: 1 10*3/uL (ref 0.7–4.0)
MCHC: 33.5 g/dL (ref 30.0–36.0)
MCV: 91.3 fl (ref 78.0–100.0)
Monocytes Absolute: 0.6 10*3/uL (ref 0.1–1.0)
Monocytes Relative: 7.5 % (ref 3.0–12.0)
Neutro Abs: 6.7 10*3/uL (ref 1.4–7.7)
Neutrophils Relative %: 79.8 % — ABNORMAL HIGH (ref 43.0–77.0)
Platelets: 274 10*3/uL (ref 150.0–400.0)
RBC: 4.4 Mil/uL (ref 4.22–5.81)
RDW: 14.1 % (ref 11.5–15.5)
WBC: 8.4 10*3/uL (ref 4.0–10.5)

## 2017-02-22 LAB — COMPREHENSIVE METABOLIC PANEL
ALT: 11 U/L (ref 0–53)
AST: 18 U/L (ref 0–37)
Albumin: 3.9 g/dL (ref 3.5–5.2)
Alkaline Phosphatase: 73 U/L (ref 39–117)
BUN: 26 mg/dL — ABNORMAL HIGH (ref 6–23)
CALCIUM: 9.7 mg/dL (ref 8.4–10.5)
CO2: 31 mEq/L (ref 19–32)
Chloride: 103 mEq/L (ref 96–112)
Creatinine, Ser: 1.63 mg/dL — ABNORMAL HIGH (ref 0.40–1.50)
GFR: 42.41 mL/min — ABNORMAL LOW (ref >60.00)
Glucose, Bld: 97 mg/dL (ref 70–99)
POTASSIUM: 4.4 meq/L (ref 3.5–5.1)
Sodium: 141 mEq/L (ref 135–145)
Total Bilirubin: 0.7 mg/dL (ref 0.2–1.2)
Total Protein: 6.6 g/dL (ref 6.0–8.3)

## 2017-02-22 LAB — TSH: TSH: 5.05 u[IU]/mL — ABNORMAL HIGH (ref 0.35–4.50)

## 2017-02-22 MED ORDER — PRAMIPEXOLE DIHYDROCHLORIDE 0.125 MG PO TABS: ORAL_TABLET | ORAL | 2 refills | Status: DC

## 2017-02-22 NOTE — Patient Instructions (Signed)
Limit your sodium (Salt) intake  Return in 2 months for follow-up  Pramipexole 1 tablet 2 hours prior to bedtime for 4 days, then increase to 2 tablets prior to bedtime

## 2017-02-22 NOTE — Progress Notes (Signed)
Subjective:    Patient ID: Raymond Swanson, male    DOB: Apr 18, 1925, 83 y.o.   MRN: [id number]  HPI 83 year old patient who is seen today for an annual health assessment.  He has been seen by oncology due to multiple cutaneous squamous cell carcinomas, mainly involving the left shoulder and forearm.  He has been followed by urology with a history of prostate cancer with biochemical recurrence following surgery.  He has also been followed by cardiology and has had a recent carotid artery duplex study performed in March.  He has also been seen by GI His main complaint continues to be insomnia.  He has difficulty falling asleep and maintaining good sleep.  He has tried melatonin and trazodone without much benefit.  He also describes severe "twitching of the legs that interferes with his ability to sleep". He describes a significant decline in his functional statusAlthough he does state that he exercises 60 minutes per day with sit ups balancing exercises and walking  Past Medical History:  Diagnosis Date  . ACUT GASTR ULCER W/HEMORR W/O MENTION OBST 09/10/2009  . ANEMIA 10/08/2009  . Cancer ([place])   . CORONARY ARTERY DISEASE 01/26/2007  . History of kidney stones   . HYPERLIPIDEMIA 01/26/2007  . HYPERTENSION 01/26/2007  . HYPOTHYROIDISM 10/18/2007  . MELENA 08/28/2009  . PROSTATE CANCER, HX OF 01/26/2007  . SKIN CANCER, HX OF 01/26/2007     Social History   Social History  . Marital status: Married    Spouse name: N/A  . Number of children: N/A  . Years of education: N/A   Occupational History  . Not on file.   Social History Main Topics  . Smoking status: Former Smoker    Packs/day: 1.00    Years: 20.00    Types: Cigarettes    Quit date: 10/28/1948  . Smokeless tobacco: Never Used  . Alcohol use Yes     Comment: 4 oz. wine daily  . Drug use: No  . Sexual activity: Not on file   Other Topics Concern  . Not on file   Social History Narrative  . No narrative on file    Past Surgical  History:  Procedure Laterality Date  . CATARACT EXTRACTION    . CORONARY ARTERY BYPASS GRAFT     1991  . EXCISION MASS UPPER EXTREMETIES Left 08/11/2016   Procedure: EXCISION MASS LEFT SHOULDER, LEFT FOREARM, LEFT WRIST;  Surgeon: Judeth Horn, MD;  Location: [facility];  Service: General;  Laterality: Left;  . HERNIA REPAIR     ingunial  . MOHS SURGERY    . PROSTATE SURGERY     prostatectomy    Family History  Problem Relation Age of Onset  . Lung cancer Brother 33  . GI problems Sister     No Known Allergies  Current Outpatient Prescriptions on File Prior to Visit  Medication Sig Dispense Refill  . acetaminophen (TYLENOL) 325 MG tablet Take 1 tablet (325 mg total) by mouth every 6 (six) hours as needed. (Patient taking differently: Take 325 mg by mouth every 6 (six) hours as needed (for pain/fever.). )    . calcium gluconate 500 MG tablet Take 500 mg by mouth daily.      . carboxymethylcellulose (REFRESH TEARS) 0.5 % SOLN Place 1 drop into both eyes 3 (three) times daily as needed (for dry/irritated eyes).    . cholecalciferol (VITAMIN D) 1000 UNITS tablet Take 1,000 Units by mouth daily.      Marland Kitchen docusate  sodium (COLACE) 100 MG capsule Take 1 capsule (100 mg total) by mouth 2 (two) times daily. 60 capsule 0  . famotidine (PEPCID) 20 MG tablet Take 1 tablet (20 mg total) by mouth 2 (two) times daily. 60 tablet 0  . levothyroxine (SYNTHROID, LEVOTHROID) 50 MCG tablet take 1 tablet by mouth once daily 30 tablet 0  . Melatonin 5 MG TABS Take 2.5 mg by mouth at bedtime.    . Multiple Vitamins-Minerals (PRESERVISION/LUTEIN PO) Take 1 tablet by mouth daily. Daily       . nitroGLYCERIN (NITROSTAT) 0.4 MG SL tablet Place 1 tablet (0.4 mg total) under the tongue every 5 (five) minutes as needed. (Patient taking differently: Place 0.4 mg under the tongue every 5 (five) minutes as needed for chest pain. ) 20 tablet 5  . polyethylene glycol (MIRALAX / GLYCOLAX) packet Take 17 g by mouth 2 (two)  times daily. 60 each 0  . simvastatin (ZOCOR) 20 MG tablet Take 1 tablet (20 mg total) by mouth daily. (Patient taking differently: Take 20 mg by mouth every evening. ) 90 tablet 3  . traZODone (DESYREL) 50 MG tablet Take 1 tablet (50 mg total) by mouth at bedtime as needed for sleep. 30 tablet 2   No current facility-administered medications on file prior to visit.     BP (!) 152/62 (BP Location: Left Arm, Patient Position: Sitting, Cuff Size: Normal)   Pulse 80   Temp 97.6 F (36.4 C) (Oral)   Ht 5\' 6"  (1.676 m)   Wt 132 lb 9.6 oz (60.1 kg)   SpO2 97%   BMI 21.40 kg/m   Subsequent Medicare wellness visit  1. Risk factors, based on past  M,S,F history .  Patient has known coronary artery disease status post CABG.  Cognitive S risk factors include hypertension, age and dyslipidemia  2.  Physical activities: exercises 60 minutes per day with balance and exercises, sit-ups and walking  3.  Depression/mood:no history of major depression.  Has had worsening fatigue over the past several months  4.  Hearing:significant deficits  5.  ADL's:independent  6.  Fall risk:high fall risk  7.  Home safety:no prongs identified  8.  Height weight, and visual acuity;height and weight stable no change in visual acuity  9.  Counseling:continue active lifestyle and heart healthy diet  10. Lab orders based on risk factors:laboratory update will be reviewed  11. Referral :follow-up cardiology and urology  12. Care plan:continue efforts at risk factor modification.  13. Cognitive assessment: alert and appropriate with normal affect  14. Screening: Patient provided with a written and personalized 5-10 year screening schedule in the AVS.    15. Provider List Update: primary care oncology dermatology urology and cardiology     Review of Systems  Constitutional: Positive for fatigue. Negative for activity change, appetite change, chills and fever.  HENT: Negative for congestion, dental  problem, ear pain, hearing loss, mouth sores, rhinorrhea, sinus pressure, sneezing, tinnitus, trouble swallowing and voice change.   Eyes: Negative for photophobia, pain, redness and visual disturbance.  Respiratory: Negative for apnea, cough, choking, chest tightness, shortness of breath and wheezing.   Cardiovascular: Negative for chest pain, palpitations and leg swelling.  Gastrointestinal: Negative for abdominal distention, abdominal pain, anal bleeding, blood in stool, constipation, diarrhea, nausea, rectal pain and vomiting.  Genitourinary: Negative for decreased urine volume, difficulty urinating, discharge, dysuria, flank pain, frequency, genital sores, hematuria, penile swelling, scrotal swelling, testicular pain and urgency.  Musculoskeletal: Positive for gait problem.  Negative for arthralgias, back pain, joint swelling, myalgias, neck pain and neck stiffness.  Skin: Positive for rash. Negative for color change and wound.  Neurological: Positive for weakness. Negative for dizziness, tremors, seizures, syncope, facial asymmetry, speech difficulty, light-headedness, numbness and headaches.  Hematological: Negative for adenopathy. Does not bruise/bleed easily.  Psychiatric/Behavioral: Positive for dysphoric mood and sleep disturbance. Negative for agitation, behavioral problems, confusion, decreased concentration, hallucinations, self-injury and suicidal ideas. The patient is not nervous/anxious.        Objective:   Physical Exam  Constitutional: He is oriented to person, place, and time. He appears well-developed.  Elderly, frail Requires some assistance to transfer Blood pressure 150/60  HENT:  Head: Normocephalic.  Right Ear: External ear normal.  Left Ear: External ear normal.  Bilateral hearing aids Wears glasses  Eyes: Conjunctivae and EOM are normal.  Neck: Normal range of motion.  Left carotid bruit  Cardiovascular: Normal rate and normal heart sounds.   Frequent  ectopics Right femoral bruit  Pulmonary/Chest: Breath sounds normal.  Status post sternotomy  Abdominal: Soft. Bowel sounds are normal. He exhibits no distension. There is no tenderness.  Lower midline surgical scar  Genitourinary: Rectal exam shows guaiac negative stool.  Musculoskeletal: Normal range of motion. He exhibits no edema or tenderness.  Neurological: He is alert and oriented to person, place, and time.  Decreased vibratory sensation distal to the knees Absent monofilament testing  Skin:  Surgical scars involving the left shoulder, arm and right shoulder Senile skin changes, generalized  Psychiatric: He has a normal mood and affect. His behavior is normal.          Assessment & Plan:   Preventive health examination Chronic insomnia/possible restless leg syndrome History recurrent squamous cell carcinoma of the skin Hypothyroidism.  We'll check TSH History of prostate cancer with elevated PSA Essential hypertension  Trial pramipexole  Check screening lab  Follow-up 6 weeks  Parv Manthey Pilar Plate

## 2017-03-12 ENCOUNTER — Other Ambulatory Visit: Payer: Self-pay | Admitting: Internal Medicine

## 2017-03-22 ENCOUNTER — Other Ambulatory Visit: Payer: Self-pay | Admitting: Internal Medicine

## 2017-04-07 ENCOUNTER — Encounter (INDEPENDENT_AMBULATORY_CARE_PROVIDER_SITE_OTHER): Payer: Medicare Other | Admitting: Ophthalmology

## 2017-04-07 DIAGNOSIS — I10 Essential (primary) hypertension: Secondary | ICD-10-CM

## 2017-04-07 DIAGNOSIS — H353112 Nonexudative age-related macular degeneration, right eye, intermediate dry stage: Secondary | ICD-10-CM

## 2017-04-07 DIAGNOSIS — H35033 Hypertensive retinopathy, bilateral: Secondary | ICD-10-CM

## 2017-04-07 DIAGNOSIS — H43813 Vitreous degeneration, bilateral: Secondary | ICD-10-CM

## 2017-04-07 DIAGNOSIS — H353221 Exudative age-related macular degeneration, left eye, with active choroidal neovascularization: Secondary | ICD-10-CM

## 2017-04-10 ENCOUNTER — Other Ambulatory Visit: Payer: Self-pay | Admitting: Internal Medicine

## 2017-04-20 DIAGNOSIS — H5021 Vertical strabismus, right eye: Secondary | ICD-10-CM | POA: Diagnosis not present

## 2017-04-20 DIAGNOSIS — H5000 Unspecified esotropia: Secondary | ICD-10-CM | POA: Diagnosis not present

## 2017-04-20 DIAGNOSIS — H353132 Nonexudative age-related macular degeneration, bilateral, intermediate dry stage: Secondary | ICD-10-CM | POA: Diagnosis not present

## 2017-04-20 DIAGNOSIS — Z961 Presence of intraocular lens: Secondary | ICD-10-CM | POA: Diagnosis not present

## 2017-04-27 ENCOUNTER — Ambulatory Visit (INDEPENDENT_AMBULATORY_CARE_PROVIDER_SITE_OTHER): Payer: Medicare Other | Admitting: Internal Medicine

## 2017-04-27 ENCOUNTER — Encounter: Payer: Self-pay | Admitting: Internal Medicine

## 2017-04-27 VITALS — BP 132/60 | HR 94 | Temp 97.7°F | Ht 66.0 in | Wt 135.0 lb

## 2017-04-27 DIAGNOSIS — I10 Essential (primary) hypertension: Secondary | ICD-10-CM | POA: Diagnosis not present

## 2017-04-27 DIAGNOSIS — I2510 Atherosclerotic heart disease of native coronary artery without angina pectoris: Secondary | ICD-10-CM

## 2017-04-27 DIAGNOSIS — E039 Hypothyroidism, unspecified: Secondary | ICD-10-CM

## 2017-04-27 DIAGNOSIS — G2581 Restless legs syndrome: Secondary | ICD-10-CM

## 2017-04-27 LAB — BASIC METABOLIC PANEL
BUN: 25 mg/dL — AB (ref 6–23)
CO2: 30 mEq/L (ref 19–32)
Calcium: 9.7 mg/dL (ref 8.4–10.5)
Chloride: 105 mEq/L (ref 96–112)
Creatinine, Ser: 1.3 mg/dL (ref 0.40–1.50)
GFR: 55.04 mL/min — AB (ref >60.00)
Glucose, Bld: 113 mg/dL — ABNORMAL HIGH (ref 70–99)
POTASSIUM: 4.4 meq/L (ref 3.5–5.1)
Sodium: 142 mEq/L (ref 135–145)

## 2017-04-27 LAB — TSH: TSH: 4.08 u[IU]/mL (ref 0.35–4.50)

## 2017-04-27 MED ORDER — SIMVASTATIN 20 MG PO TABS: 20.0000 mg | ORAL_TABLET | Freq: Every day | ORAL | 3 refills | Status: DC

## 2017-04-27 MED ORDER — LEVOTHYROXINE SODIUM 50 MCG PO TABS: 50.0000 ug | ORAL_TABLET | Freq: Every day | ORAL | 4 refills | Status: DC

## 2017-04-27 MED ORDER — PRAMIPEXOLE DIHYDROCHLORIDE 0.125 MG PO TABS: ORAL_TABLET | ORAL | 4 refills | Status: DC

## 2017-04-27 NOTE — Progress Notes (Signed)
Subjective:    Patient ID: Raymond Swanson, male    DOB: 08-18-1925, 83 y.o.   MRN: [id number]  HPI  83 year old patient who is seen today in follow-up. 2 months ago he was placed on Mirapex for possible restless leg syndrome. He has had some chronic insomnia issues and gave a history of "twitching of the leg".  He has had a dramatic response to low-dose Mirapex and now sleeps often 8 hours per night.  He describes increase energy level and now is walking much more effectively often without his cane. He is very pleased with his progress He requests form completion for DMV handicap placard Request copies of his lab.  This was reviewed and revealed a slightly elevated TSH and a slight bump in his creatinine  Past Medical History:  Diagnosis Date  . ACUT GASTR ULCER W/HEMORR W/O MENTION OBST 09/10/2009  . ANEMIA 10/08/2009  . Cancer ([place])   . CORONARY ARTERY DISEASE 01/26/2007  . History of kidney stones   . HYPERLIPIDEMIA 01/26/2007  . HYPERTENSION 01/26/2007  . HYPOTHYROIDISM 10/18/2007  . MELENA 08/28/2009  . PROSTATE CANCER, HX OF 01/26/2007  . SKIN CANCER, HX OF 01/26/2007     Social History   Social History  . Marital status: Married    Spouse name: N/A  . Number of children: N/A  . Years of education: N/A   Occupational History  . Not on file.   Social History Main Topics  . Smoking status: Former Smoker    Packs/day: 1.00    Years: 20.00    Types: Cigarettes    Quit date: 10/28/1948  . Smokeless tobacco: Never Used  . Alcohol use Yes     Comment: 4 oz. wine daily  . Drug use: No  . Sexual activity: Not on file   Other Topics Concern  . Not on file   Social History Narrative  . No narrative on file    Past Surgical History:  Procedure Laterality Date  . CATARACT EXTRACTION    . CORONARY ARTERY BYPASS GRAFT     1991  . EXCISION MASS UPPER EXTREMETIES Left 08/11/2016   Procedure: EXCISION MASS LEFT SHOULDER, LEFT FOREARM, LEFT WRIST;  Surgeon: Judeth Horn, MD;   Location: [facility];  Service: General;  Laterality: Left;  . HERNIA REPAIR     ingunial  . MOHS SURGERY    . PROSTATE SURGERY     prostatectomy    Family History  Problem Relation Age of Onset  . Lung cancer Brother 74  . GI problems Sister     No Known Allergies  Current Outpatient Prescriptions on File Prior to Visit  Medication Sig Dispense Refill  . acetaminophen (TYLENOL) 325 MG tablet Take 1 tablet (325 mg total) by mouth every 6 (six) hours as needed. (Patient taking differently: Take 325 mg by mouth every 6 (six) hours as needed (for pain/fever.). )    . calcium gluconate 500 MG tablet Take 500 mg by mouth daily.      . carboxymethylcellulose (REFRESH TEARS) 0.5 % SOLN Place 1 drop into both eyes 3 (three) times daily as needed (for dry/irritated eyes).    . cholecalciferol (VITAMIN D) 1000 UNITS tablet Take 1,000 Units by mouth daily.      Marland Kitchen docusate sodium (COLACE) 100 MG capsule Take 1 capsule (100 mg total) by mouth 2 (two) times daily. 60 capsule 0  . famotidine (PEPCID) 20 MG tablet Take 1 tablet (20 mg total) by mouth 2 (two)  times daily. 60 tablet 0  . Melatonin 5 MG TABS Take 2.5 mg by mouth at bedtime.    . Multiple Vitamins-Minerals (PRESERVISION/LUTEIN PO) Take 1 tablet by mouth daily. Daily       . nitroGLYCERIN (NITROSTAT) 0.4 MG SL tablet Place 1 tablet (0.4 mg total) under the tongue every 5 (five) minutes as needed. (Patient taking differently: Place 0.4 mg under the tongue every 5 (five) minutes as needed for chest pain. ) 20 tablet 5  . polyethylene glycol (MIRALAX / GLYCOLAX) packet Take 17 g by mouth 2 (two) times daily. 60 each 0   No current facility-administered medications on file prior to visit.     BP 132/60 (BP Location: Left Arm, Patient Position: Sitting, Cuff Size: Normal)   Pulse 94   Temp 97.7 F (36.5 C) (Oral)   Ht 5\' 6"  (1.676 m)   Wt 135 lb (61.2 kg)   SpO2 96%   BMI 21.79 kg/m     Review of Systems  Constitutional: Negative for  appetite change, chills, fatigue and fever.  HENT: Negative for congestion, dental problem, ear pain, hearing loss, sore throat, tinnitus, trouble swallowing and voice change.   Eyes: Negative for pain, discharge and visual disturbance.  Respiratory: Negative for cough, chest tightness, wheezing and stridor.   Cardiovascular: Negative for chest pain, palpitations and leg swelling.  Gastrointestinal: Negative for abdominal distention, abdominal pain, blood in stool, constipation, diarrhea, nausea and vomiting.  Genitourinary: Negative for difficulty urinating, discharge, flank pain, genital sores, hematuria and urgency.  Musculoskeletal: Positive for gait problem. Negative for arthralgias, back pain, joint swelling, myalgias and neck stiffness.  Skin: Negative for rash.  Neurological: Negative for dizziness, syncope, speech difficulty, weakness, numbness and headaches.  Hematological: Negative for adenopathy. Does not bruise/bleed easily.  Psychiatric/Behavioral: Negative for behavioral problems and dysphoric mood. The patient is not nervous/anxious.        Objective:   Physical Exam  Constitutional: He is oriented to person, place, and time. He appears well-developed and well-nourished. No distress.  Musculoskeletal:  Walks with a cane  Neurological: He is alert and oriented to person, place, and time.          Assessment & Plan:   Restless leg syndrome.  Very nice clinical response to low-dose Mirapex.  We'll continue Hypothyroidism.  TSH was slightly elevated.  Will repeat.  Consider dose adjustment Elevated creatinine.  We'll repeat creatinine and electrolytes. Dyslipidemia.  Continue statin therapy  Follow-up 6 months  [email or web address],PETER Pilar Plate

## 2017-04-27 NOTE — Patient Instructions (Addendum)
Return in 6 months for follow-up  Restless Legs Syndrome Restless legs syndrome is a condition that causes uncomfortable feelings or sensations in the legs, especially while sitting or lying down. The sensations usually cause an overwhelming urge to move the legs. The arms can also sometimes be affected. The condition can range from mild to severe. The symptoms often interfere with a person's ability to sleep. What are the causes? The cause of this condition is not known. What increases the risk? This condition is more likely to develop in:  People who are older than age 57.  Pregnant women. In general, restless legs syndrome is more common in women than in men.  People who have a family history of the condition.  People who have certain medical conditions, such as iron deficiency, kidney disease, Parkinson disease, or nerve damage.  People who take certain medicines, such as medicines for high blood pressure, nausea, colds, allergies, depression, and some heart conditions.  What are the signs or symptoms? The main symptom of this condition is uncomfortable sensations in the legs. These sensations may be:  Described as pulling, tingling, prickling, throbbing, crawling, or burning.  Worse while you are sitting or lying down.  Worse during periods of rest or inactivity.  Worse at night, often interfering with your sleep.  Accompanied by a very strong urge to move your legs.  Temporarily relieved by movement of your legs.  The sensations usually affect both sides of the body. The arms can also be affected, but this is rare. People who have this condition often have tiredness during the day because of their lack of sleep at night. How is this diagnosed? This condition may be diagnosed based on your description of the symptoms. You may also have tests, including blood tests, to check for other conditions that may lead to your symptoms. In some cases, you may be asked to spend some  time in a sleep lab so your sleeping can be monitored. How is this treated? Treatment for this condition is focused on managing the symptoms. Treatment may include:  Self-help and lifestyle changes.  Medicines.  Follow these instructions at home:  Take medicines only as directed by your health care provider.  Try these methods to get temporary relief from the uncomfortable sensations: ? Massage your legs. ? Walk or stretch. ? Take a cold or hot bath.  Practice good sleep habits. For example, go to bed and get up at the same time every day.  Exercise regularly.  Practice ways of relaxing, such as yoga or meditation.  Avoid caffeine and alcohol.  Do not use any tobacco products, including cigarettes, chewing tobacco, or electronic cigarettes. If you need help quitting, ask your health care provider.  Keep all follow-up visits as directed by your health care provider. This is important. Contact a health care provider if: Your symptoms do not improve with treatment, or they get worse. This information is not intended to replace advice given to you by your health care provider. Make sure you discuss any questions you have with your health care provider. Document Released: 07/31/2002 Document Revised: 01/16/2016 Document Reviewed: 08/06/2014 Elsevier Interactive Patient Education  [employer].

## 2017-05-13 ENCOUNTER — Encounter: Payer: Self-pay | Admitting: Internal Medicine

## 2017-06-01 ENCOUNTER — Ambulatory Visit (INDEPENDENT_AMBULATORY_CARE_PROVIDER_SITE_OTHER): Payer: Medicare Other | Admitting: *Deleted

## 2017-06-01 DIAGNOSIS — Z23 Encounter for immunization: Secondary | ICD-10-CM

## 2017-07-06 ENCOUNTER — Telehealth: Payer: Self-pay | Admitting: Internal Medicine

## 2017-07-06 NOTE — Telephone Encounter (Signed)
Patient walked in needing the instructions to be changed on pramipexole (MIRAPEX) 0.125 MG tablet  The old Rx said to take 1 daily for a week then go to 2 daily  He just needs it changed back to take 2 tablets daily  Send to: [facility] used to be:  [id number], [place]  Please advise

## 2017-07-08 ENCOUNTER — Other Ambulatory Visit: Payer: Self-pay | Admitting: Internal Medicine

## 2017-07-08 MED ORDER — PRAMIPEXOLE DIHYDROCHLORIDE 0.125 MG PO TABS: ORAL_TABLET | ORAL | 4 refills | Status: DC

## 2017-07-08 NOTE — Telephone Encounter (Signed)
Okay to refill with a twice daily dosing

## 2017-07-08 NOTE — Telephone Encounter (Signed)
Please advise on dosage

## 2017-07-08 NOTE — Telephone Encounter (Signed)
Prescription was corrected and sent to the pharmacy.

## 2017-07-26 DIAGNOSIS — R3129 Other microscopic hematuria: Secondary | ICD-10-CM | POA: Diagnosis not present

## 2017-07-26 DIAGNOSIS — M545 Low back pain: Secondary | ICD-10-CM | POA: Diagnosis not present

## 2017-07-26 DIAGNOSIS — R350 Frequency of micturition: Secondary | ICD-10-CM | POA: Diagnosis not present

## 2017-07-26 DIAGNOSIS — C61 Malignant neoplasm of prostate: Secondary | ICD-10-CM | POA: Diagnosis not present

## 2017-08-19 DIAGNOSIS — C61 Malignant neoplasm of prostate: Secondary | ICD-10-CM | POA: Diagnosis not present

## 2017-08-19 DIAGNOSIS — R310 Gross hematuria: Secondary | ICD-10-CM | POA: Diagnosis not present

## 2017-09-01 ENCOUNTER — Encounter (INDEPENDENT_AMBULATORY_CARE_PROVIDER_SITE_OTHER): Payer: Medicare Other | Admitting: Ophthalmology

## 2017-09-01 DIAGNOSIS — H353221 Exudative age-related macular degeneration, left eye, with active choroidal neovascularization: Secondary | ICD-10-CM | POA: Diagnosis not present

## 2017-09-01 DIAGNOSIS — H353112 Nonexudative age-related macular degeneration, right eye, intermediate dry stage: Secondary | ICD-10-CM | POA: Diagnosis not present

## 2017-09-01 DIAGNOSIS — H35033 Hypertensive retinopathy, bilateral: Secondary | ICD-10-CM

## 2017-09-01 DIAGNOSIS — I10 Essential (primary) hypertension: Secondary | ICD-10-CM

## 2017-09-01 DIAGNOSIS — H43813 Vitreous degeneration, bilateral: Secondary | ICD-10-CM

## 2017-09-09 ENCOUNTER — Other Ambulatory Visit (HOSPITAL_COMMUNITY): Payer: Self-pay | Admitting: Urology

## 2017-09-09 DIAGNOSIS — R972 Elevated prostate specific antigen [PSA]: Secondary | ICD-10-CM

## 2017-09-09 DIAGNOSIS — C61 Malignant neoplasm of prostate: Secondary | ICD-10-CM

## 2017-09-09 DIAGNOSIS — R310 Gross hematuria: Secondary | ICD-10-CM | POA: Diagnosis not present

## 2017-09-09 DIAGNOSIS — C672 Malignant neoplasm of lateral wall of bladder: Secondary | ICD-10-CM | POA: Diagnosis not present

## 2017-09-09 DIAGNOSIS — R9721 Rising PSA following treatment for malignant neoplasm of prostate: Secondary | ICD-10-CM | POA: Diagnosis not present

## 2017-09-20 ENCOUNTER — Telehealth: Payer: Self-pay | Admitting: Internal Medicine

## 2017-09-20 NOTE — Telephone Encounter (Signed)
Copied from [place] [id number]. Topic: Inquiry >> Sep 20, 2017  4:58 PM Conception Chancy, NT wrote: Marlowe Kays is calling from [facility] and states she needs clearance for pt to have surgery for a  cystoscopy and bipolar transurethral resection of a bladder tumor and stint placement. Marlowe Kays will be faxing over a form and can also be called back. [phone] ext [phone]

## 2017-09-21 NOTE — Telephone Encounter (Signed)
Sir please see below message, please advise.

## 2017-09-22 NOTE — Telephone Encounter (Signed)
okay

## 2017-09-23 ENCOUNTER — Encounter (HOSPITAL_COMMUNITY)
Admission: RE | Admit: 2017-09-23 | Discharge: 2017-09-23 | Disposition: A | Discharge status: Home / Self Care | Payer: Medicare Other | Source: Ambulatory Visit | Attending: Urology | Admitting: Urology

## 2017-09-23 DIAGNOSIS — N200 Calculus of kidney: Secondary | ICD-10-CM | POA: Diagnosis not present

## 2017-09-23 DIAGNOSIS — R972 Elevated prostate specific antigen [PSA]: Secondary | ICD-10-CM | POA: Diagnosis not present

## 2017-09-23 DIAGNOSIS — R9349 Abnormal radiologic findings on diagnostic imaging of other urinary organs: Secondary | ICD-10-CM | POA: Diagnosis not present

## 2017-09-23 DIAGNOSIS — C61 Malignant neoplasm of prostate: Secondary | ICD-10-CM

## 2017-09-23 DIAGNOSIS — R937 Abnormal findings on diagnostic imaging of other parts of musculoskeletal system: Secondary | ICD-10-CM | POA: Insufficient documentation

## 2017-09-23 DIAGNOSIS — R9721 Rising PSA following treatment for malignant neoplasm of prostate: Secondary | ICD-10-CM | POA: Insufficient documentation

## 2017-09-23 DIAGNOSIS — R310 Gross hematuria: Secondary | ICD-10-CM | POA: Diagnosis not present

## 2017-09-23 MED ORDER — TECHNETIUM TC 99M MEDRONATE IV KIT: 22.0000 | PACK | Freq: Once | INTRAVENOUS | Status: DC | PRN

## 2017-09-23 NOTE — Telephone Encounter (Signed)
[facility] is requested a note with the okay and doctor signature fax number is [phone]. Pt has not been scheduled at this time.

## 2017-09-24 ENCOUNTER — Encounter: Payer: Self-pay | Admitting: Family Medicine

## 2017-09-24 NOTE — Telephone Encounter (Signed)
Letter printed and faxed to [phone]

## 2017-09-27 NOTE — Telephone Encounter (Signed)
Dr K 

## 2017-09-30 DIAGNOSIS — C672 Malignant neoplasm of lateral wall of bladder: Secondary | ICD-10-CM | POA: Diagnosis not present

## 2017-09-30 DIAGNOSIS — R310 Gross hematuria: Secondary | ICD-10-CM | POA: Diagnosis not present

## 2017-09-30 DIAGNOSIS — C61 Malignant neoplasm of prostate: Secondary | ICD-10-CM | POA: Diagnosis not present

## 2017-09-30 DIAGNOSIS — R972 Elevated prostate specific antigen [PSA]: Secondary | ICD-10-CM | POA: Diagnosis not present

## 2017-10-04 ENCOUNTER — Other Ambulatory Visit: Payer: Self-pay | Admitting: Urology

## 2017-10-08 NOTE — Patient Instructions (Addendum)
Raymond Swanson  10/08/2017   Your procedure is scheduled on: 10-13-17   Report to [facility] Main  Entrance    Report to admitting at 8:00AM   Call this number if you have problems the morning of surgery [phone]     Remember: Do not eat food or drink liquids :After Midnight.     Take these medicines the morning of surgery with A SIP OF WATER: levothyroxine, tylenol if needed, eye drops                                 You may not have any metal on your body including hair pins and              piercings  Do not wear jewelry, make-up, lotions, powders or perfumes, deodorant              Men may shave face and neck.   Do not bring valuables to the hospital. [place].  Contacts, dentures or bridgework may not be worn into surgery.      Patients discharged the day of surgery will not be allowed to drive home.  Name and phone number of your driver:  Special Instructions: N/A              Please read over the following fact sheets you were given: _____________________________________________________________________             [facility] - Preparing for Surgery Before surgery, you can play an important role.  Because skin is not sterile, your skin needs to be as free of germs as possible.  You can reduce the number of germs on your skin by washing with CHG (chlorahexidine gluconate) soap before surgery.  CHG is an antiseptic cleaner which kills germs and bonds with the skin to continue killing germs even after washing. Please DO NOT use if you have an allergy to CHG or antibacterial soaps.  If your skin becomes reddened/irritated stop using the CHG and inform your nurse when you arrive at Short Stay. Do not shave (including legs and underarms) for at least 48 hours prior to the first CHG shower.  You may shave your face/neck. Please follow these instructions carefully:  1.  Shower with CHG Soap the  night before surgery and the  morning of Surgery.  2.  If you choose to wash your hair, wash your hair first as usual with your  normal  shampoo.  3.  After you shampoo, rinse your hair and body thoroughly to remove the  shampoo.                           4.  Use CHG as you would any other liquid soap.  You can apply chg directly  to the skin and wash                       Gently with a scrungie or clean washcloth.  5.  Apply the CHG Soap to your body ONLY FROM THE NECK DOWN.   Do not use on face/ open  Wound or open sores. Avoid contact with eyes, ears mouth and genitals (private parts).                       Wash face,  Genitals (private parts) with your normal soap.             6.  Wash thoroughly, paying special attention to the area where your surgery  will be performed.  7.  Thoroughly rinse your body with warm water from the neck down.  8.  DO NOT shower/wash with your normal soap after using and rinsing off  the CHG Soap.                9.  Pat yourself dry with a clean towel.            10.  Wear clean pajamas.            11.  Place clean sheets on your bed the night of your first shower and do not  sleep with pets. Day of Surgery : Do not apply any lotions/deodorants the morning of surgery.  Please wear clean clothes to the hospital/surgery center.  FAILURE TO FOLLOW THESE INSTRUCTIONS MAY RESULT IN THE CANCELLATION OF YOUR SURGERY PATIENT SIGNATURE_________________________________  NURSE SIGNATURE__________________________________  ________________________________________________________________________

## 2017-10-08 NOTE — Progress Notes (Signed)
[place] cardiology Dr. Minus Breeding 11-19-16 epic

## 2017-10-11 ENCOUNTER — Encounter (HOSPITAL_COMMUNITY): Payer: Self-pay

## 2017-10-11 ENCOUNTER — Other Ambulatory Visit: Payer: Self-pay

## 2017-10-11 ENCOUNTER — Encounter (HOSPITAL_COMMUNITY)
Admission: RE | Admit: 2017-10-11 | Discharge: 2017-10-11 | Disposition: A | Discharge status: Home / Self Care | Payer: Medicare Other | Source: Ambulatory Visit | Attending: Urology | Admitting: Urology

## 2017-10-11 DIAGNOSIS — Z79899 Other long term (current) drug therapy: Secondary | ICD-10-CM | POA: Diagnosis not present

## 2017-10-11 DIAGNOSIS — E039 Hypothyroidism, unspecified: Secondary | ICD-10-CM | POA: Diagnosis not present

## 2017-10-11 DIAGNOSIS — Z87891 Personal history of nicotine dependence: Secondary | ICD-10-CM | POA: Diagnosis not present

## 2017-10-11 DIAGNOSIS — Z87442 Personal history of urinary calculi: Secondary | ICD-10-CM | POA: Diagnosis not present

## 2017-10-11 DIAGNOSIS — Z85828 Personal history of other malignant neoplasm of skin: Secondary | ICD-10-CM | POA: Diagnosis not present

## 2017-10-11 DIAGNOSIS — Z8546 Personal history of malignant neoplasm of prostate: Secondary | ICD-10-CM | POA: Diagnosis not present

## 2017-10-11 DIAGNOSIS — I10 Essential (primary) hypertension: Secondary | ICD-10-CM | POA: Diagnosis not present

## 2017-10-11 DIAGNOSIS — R310 Gross hematuria: Secondary | ICD-10-CM | POA: Diagnosis not present

## 2017-10-11 DIAGNOSIS — C672 Malignant neoplasm of lateral wall of bladder: Secondary | ICD-10-CM | POA: Diagnosis not present

## 2017-10-11 DIAGNOSIS — Z951 Presence of aortocoronary bypass graft: Secondary | ICD-10-CM | POA: Diagnosis not present

## 2017-10-11 DIAGNOSIS — D649 Anemia, unspecified: Secondary | ICD-10-CM | POA: Diagnosis not present

## 2017-10-11 DIAGNOSIS — E785 Hyperlipidemia, unspecified: Secondary | ICD-10-CM | POA: Diagnosis not present

## 2017-10-11 DIAGNOSIS — I2510 Atherosclerotic heart disease of native coronary artery without angina pectoris: Secondary | ICD-10-CM | POA: Diagnosis not present

## 2017-10-11 HISTORY — DX: Unspecified fall, initial encounter: W19.XXXA

## 2017-10-11 HISTORY — DX: Unspecified hearing loss, unspecified ear: H91.90

## 2017-10-11 LAB — BASIC METABOLIC PANEL
Anion gap: 8 (ref 5–15)
BUN: 24 mg/dL — AB (ref 6–20)
CALCIUM: 9.3 mg/dL (ref 8.9–10.3)
CO2: 26 mmol/L (ref 22–32)
Chloride: 106 mmol/L (ref 101–111)
Creatinine, Ser: 1.27 mg/dL — ABNORMAL HIGH (ref 0.61–1.24)
GFR calc Af Amer: 55 mL/min — ABNORMAL LOW (ref >60)
GFR, EST NON AFRICAN AMERICAN: 48 mL/min — AB (ref >60)
GLUCOSE: 103 mg/dL — AB (ref 65–99)
POTASSIUM: 4.4 mmol/L (ref 3.5–5.1)
SODIUM: 140 mmol/L (ref 135–145)

## 2017-10-11 LAB — CBC
HEMATOCRIT: 34.9 % — AB (ref 39.0–52.0)
Hemoglobin: 11.2 g/dL — ABNORMAL LOW (ref 13.0–17.0)
MCH: 29.2 pg (ref 26.0–34.0)
MCHC: 32.1 g/dL (ref 30.0–36.0)
MCV: 90.9 fL (ref 78.0–100.0)
PLATELETS: 225 10*3/uL (ref 150–400)
RBC: 3.84 MIL/uL — ABNORMAL LOW (ref 4.22–5.81)
RDW: 14.1 % (ref 11.5–15.5)
WBC: 5.8 10*3/uL (ref 4.0–10.5)

## 2017-10-11 NOTE — H&P (Signed)
Urology Preoperative H&P   Chief Complaint: Bladder tumor  History of Present Illness: Raymond Swanson is a 84 y.o. male with recurrent episodes of painless gross hematuria starting in December 2018.  He underwent a cystoscopy in the office and was found to have a large papillary bladder tumor concerning for malignancy.  He also has a long history of prostate cancer treated via RRP in 1990 and was recently found to have a markedly elevated PSA.  He had a re-staging CT and bone scan that was negative for signs of distant metastasis, but did note the large bladder lesion seen on cystoscopy.    From a voiding standpoint, he feels like he is emptying his bladder well and denies pain or discomfort with urination.     Past Medical History:  Diagnosis Date  . ACUT GASTR ULCER W/HEMORR W/O MENTION OBST 09/10/2009  . ANEMIA 10/08/2009  . Cancer ([place])   . CORONARY ARTERY DISEASE 01/26/2007  . Fall 2016   fall from deck while cleaning gutters; sustained subdemral hematoma ; doing ok now   . History of kidney stones   . HOH (hard of hearing)   . HYPERLIPIDEMIA 01/26/2007  . HYPERTENSION 01/26/2007  . HYPOTHYROIDISM 10/18/2007  . MELENA 08/28/2009  . PROSTATE CANCER, HX OF 01/26/2007  . SKIN CANCER, HX OF 01/26/2007    Past Surgical History:  Procedure Laterality Date  . CATARACT EXTRACTION    . CORONARY ARTERY BYPASS GRAFT     1991; reports at PAT appt 10-11-17: i went to my priamry doctor for a physical and had aroutine stress test that was bad, was sent for cath ( more than 1 but nsure how many or if stents were placed), he rprots " that didnt work so they did the surgery". denies heart symptoms for over 20 years;  sees  cardiology [facility] annually for EKGs   . EXCISION MASS UPPER EXTREMETIES Left 08/11/2016   Procedure: EXCISION MASS LEFT SHOULDER, LEFT FOREARM, LEFT WRIST;  Surgeon: Judeth Horn, MD;  Location: [facility];  Service: General;  Laterality: Left;  . HERNIA REPAIR     ingunial  . MOHS SURGERY     . PROSTATE SURGERY     prostatectomy    Allergies: No Known Allergies  Family History  Problem Relation Age of Onset  . Lung cancer Brother 76  . GI problems Sister     Social History:  reports that he quit smoking about 69 years ago. His smoking use included cigarettes. He has a 20.00 pack-year smoking history. he has never used smokeless tobacco. He reports that he drinks alcohol. He reports that he does not use drugs.  ROS: A complete review of systems was performed.  All systems are negative except for pertinent findings as noted.  Physical Exam:  Vital signs in last 24 hours: Pulse Rate:  [82] 82 (02/18 1417) Resp:  [18] 18 (02/18 1417) BP: (141)/(68) 141/68 (02/18 1417) SpO2:  [99 %] 99 % (02/18 1417) Weight:  [62.1 kg (137 lb)] 62.1 kg (137 lb) (02/18 1417) Constitutional:  Alert and oriented, No acute distress Cardiovascular: Regular rate and rhythm, No JVD Respiratory: Normal respiratory effort, Lungs clear bilaterally GI: Abdomen is soft, nontender, nondistended, no abdominal masses GU: No CVA tenderness Lymphatic: No lymphadenopathy Neurologic: Grossly intact, no focal deficits Psychiatric: Normal mood and affect  Laboratory Data:  Recent Labs    10/11/17 1510  WBC 5.8  HGB 11.2*  HCT 34.9*  PLT 225    Recent Labs  10/11/17 1510  NA 140  K 4.4  CL 106  GLUCOSE 103*  BUN 24*  CALCIUM 9.3  CREATININE 1.27*     Results for orders placed or performed during the hospital encounter of 10/11/17 (from the past 24 hour(s))  Basic metabolic panel     Status: Abnormal   Collection Time: 10/11/17  3:10 PM  Result Value Ref Range   Sodium 140 135 - 145 mmol/L   Potassium 4.4 3.5 - 5.1 mmol/L   Chloride 106 101 - 111 mmol/L   CO2 26 22 - 32 mmol/L   Glucose, Bld 103 (H) 65 - 99 mg/dL   BUN 24 (H) 6 - 20 mg/dL   Creatinine, Ser 1.27 (H) 0.61 - 1.24 mg/dL   Calcium 9.3 8.9 - 10.3 mg/dL   GFR calc non Af Amer 48 (L) >60 mL/min   GFR calc Af Amer 55  (L) >60 mL/min   Anion gap 8 5 - 15  CBC     Status: Abnormal   Collection Time: 10/11/17  3:10 PM  Result Value Ref Range   WBC 5.8 4.0 - 10.5 K/uL   RBC 3.84 (L) 4.22 - 5.81 MIL/uL   Hemoglobin 11.2 (L) 13.0 - 17.0 g/dL   HCT 34.9 (L) 39.0 - 52.0 %   MCV 90.9 78.0 - 100.0 fL   MCH 29.2 26.0 - 34.0 pg   MCHC 32.1 30.0 - 36.0 g/dL   RDW 14.1 11.5 - 15.5 %   Platelets 225 150 - 400 K/uL   No results found for this or any previous visit (from the past 240 hour(s)).  Renal Function: Recent Labs    10/11/17 1510  CREATININE 1.27*   Estimated Creatinine Clearance: 33.3 mL/min (A) (by C-G formula based on SCr of 1.27 mg/dL (H)).  Radiologic Imaging: No results found.  I independently reviewed the above imaging studies.  Assessment and Plan Raymond Swanson is a 84 y.o. male recurrent episodes of gross hematuria and a large papillary bladder tumor   -I had a long discussion with the patient concerning his age/medical co-morbidities, rising PSA as well as the bladder tumor identified on cystoscopy during his last OV (likely the source of his ongoing gross hematuria). His intermittent episodes of gross hematuria will likely recur until his bladder tumor is addressed via a TURBT.  -The risks, benefits and alternatives of cystoscopy with transurethral resection of bladder tumor was discussed with the patient. Risks include bleeding, urinary tract infection, bladder perforation requiring prolonged Foley catheterization and/or open repair, bladder tumor recurrence following resection and the inherent risk with general anesthesia such as myocardial infarction, pulmonary embolism, cerebrovascular accident and other imponderables.  He voices understanding and wishes to proceed.     Ellison Hughs, MD 10/11/2017, 8:04 PM  [facility] Pager: [phone]

## 2017-10-11 NOTE — Progress Notes (Signed)
RN called and LVMM for Raymond Swanson at [facility] urology requesting any clearances for Wednesday surgery  be faxed to PAT .

## 2017-10-12 DIAGNOSIS — H903 Sensorineural hearing loss, bilateral: Secondary | ICD-10-CM | POA: Diagnosis not present

## 2017-10-12 DIAGNOSIS — H6122 Impacted cerumen, left ear: Secondary | ICD-10-CM | POA: Diagnosis not present

## 2017-10-12 NOTE — Progress Notes (Signed)
Surgical clearance Dr. Joycelyn Man on chart

## 2017-10-13 ENCOUNTER — Encounter (HOSPITAL_COMMUNITY): Payer: Self-pay

## 2017-10-13 ENCOUNTER — Ambulatory Visit (HOSPITAL_COMMUNITY)
Admission: RE | Admit: 2017-10-13 | Discharge: 2017-10-13 | Disposition: A | Discharge status: Home / Self Care | Payer: Medicare Other | Source: Ambulatory Visit | Attending: Urology | Admitting: Urology

## 2017-10-13 ENCOUNTER — Ambulatory Visit (HOSPITAL_COMMUNITY): Payer: Medicare Other | Admitting: Anesthesiology

## 2017-10-13 ENCOUNTER — Encounter (HOSPITAL_COMMUNITY)
Admission: RE | Disposition: A | Discharge status: Home / Self Care | Payer: Self-pay | Source: Ambulatory Visit | Attending: Urology

## 2017-10-13 ENCOUNTER — Ambulatory Visit (HOSPITAL_COMMUNITY): Payer: Medicare Other

## 2017-10-13 DIAGNOSIS — Z87891 Personal history of nicotine dependence: Secondary | ICD-10-CM | POA: Insufficient documentation

## 2017-10-13 DIAGNOSIS — I2510 Atherosclerotic heart disease of native coronary artery without angina pectoris: Secondary | ICD-10-CM | POA: Diagnosis not present

## 2017-10-13 DIAGNOSIS — I10 Essential (primary) hypertension: Secondary | ICD-10-CM | POA: Diagnosis not present

## 2017-10-13 DIAGNOSIS — Z79899 Other long term (current) drug therapy: Secondary | ICD-10-CM | POA: Diagnosis not present

## 2017-10-13 DIAGNOSIS — D649 Anemia, unspecified: Secondary | ICD-10-CM | POA: Diagnosis not present

## 2017-10-13 DIAGNOSIS — E785 Hyperlipidemia, unspecified: Secondary | ICD-10-CM | POA: Insufficient documentation

## 2017-10-13 DIAGNOSIS — Z8546 Personal history of malignant neoplasm of prostate: Secondary | ICD-10-CM | POA: Insufficient documentation

## 2017-10-13 DIAGNOSIS — Z951 Presence of aortocoronary bypass graft: Secondary | ICD-10-CM | POA: Insufficient documentation

## 2017-10-13 DIAGNOSIS — D494 Neoplasm of unspecified behavior of bladder: Secondary | ICD-10-CM | POA: Diagnosis not present

## 2017-10-13 DIAGNOSIS — N135 Crossing vessel and stricture of ureter without hydronephrosis: Secondary | ICD-10-CM | POA: Diagnosis not present

## 2017-10-13 DIAGNOSIS — Z85828 Personal history of other malignant neoplasm of skin: Secondary | ICD-10-CM | POA: Insufficient documentation

## 2017-10-13 DIAGNOSIS — E039 Hypothyroidism, unspecified: Secondary | ICD-10-CM | POA: Diagnosis not present

## 2017-10-13 DIAGNOSIS — C679 Malignant neoplasm of bladder, unspecified: Secondary | ICD-10-CM | POA: Diagnosis not present

## 2017-10-13 DIAGNOSIS — Z87442 Personal history of urinary calculi: Secondary | ICD-10-CM | POA: Insufficient documentation

## 2017-10-13 DIAGNOSIS — R310 Gross hematuria: Secondary | ICD-10-CM | POA: Diagnosis not present

## 2017-10-13 DIAGNOSIS — C672 Malignant neoplasm of lateral wall of bladder: Secondary | ICD-10-CM | POA: Diagnosis not present

## 2017-10-13 HISTORY — PX: TRANSURETHRAL RESECTION OF BLADDER TUMOR: SHX2575

## 2017-10-13 HISTORY — PX: CYSTOSCOPY W/ URETERAL STENT PLACEMENT: SHX1429

## 2017-10-13 SURGERY — TURBT (TRANSURETHRAL RESECTION OF BLADDER TUMOR)
Anesthesia: General | Laterality: Right

## 2017-10-13 MED ORDER — ROCURONIUM BROMIDE 100 MG/10ML IV SOLN
INTRAVENOUS | Status: DC | PRN
  Administered 2017-10-13: 45 mg via INTRAVENOUS

## 2017-10-13 MED ORDER — CEFAZOLIN SODIUM-DEXTROSE 2-4 GM/100ML-% IV SOLN
2.0000 g | Freq: Once | INTRAVENOUS | Status: AC
  Administered 2017-10-13: 2 g via INTRAVENOUS
  Filled 2017-10-13: qty 100

## 2017-10-13 MED ORDER — ONDANSETRON HCL 4 MG/2ML IJ SOLN
INTRAMUSCULAR | Status: AC
  Filled 2017-10-13: qty 2

## 2017-10-13 MED ORDER — PHENYLEPHRINE 40 MCG/ML (10ML) SYRINGE FOR IV PUSH (FOR BLOOD PRESSURE SUPPORT)
PREFILLED_SYRINGE | INTRAVENOUS | Status: DC | PRN
  Administered 2017-10-13 (×3): 40 ug via INTRAVENOUS

## 2017-10-13 MED ORDER — PROPOFOL 10 MG/ML IV BOLUS
INTRAVENOUS | Status: DC | PRN
  Administered 2017-10-13: 80 mg via INTRAVENOUS
  Administered 2017-10-13: 30 mg via INTRAVENOUS

## 2017-10-13 MED ORDER — PHENAZOPYRIDINE HCL 200 MG PO TABS: 200.0000 mg | ORAL_TABLET | Freq: Three times a day (TID) | ORAL | 0 refills | Status: DC | PRN

## 2017-10-13 MED ORDER — ONDANSETRON HCL 4 MG/2ML IJ SOLN
INTRAMUSCULAR | Status: DC | PRN
  Administered 2017-10-13: 4 mg via INTRAVENOUS

## 2017-10-13 MED ORDER — FENTANYL CITRATE (PF) 100 MCG/2ML IJ SOLN
INTRAMUSCULAR | Status: DC | PRN
  Administered 2017-10-13 (×2): 25 ug via INTRAVENOUS

## 2017-10-13 MED ORDER — ROCURONIUM BROMIDE 10 MG/ML (PF) SYRINGE
PREFILLED_SYRINGE | INTRAVENOUS | Status: AC
  Filled 2017-10-13: qty 5

## 2017-10-13 MED ORDER — PROPOFOL 10 MG/ML IV BOLUS
INTRAVENOUS | Status: AC
  Filled 2017-10-13: qty 20

## 2017-10-13 MED ORDER — ONDANSETRON HCL 4 MG PO TABS: 4.0000 mg | ORAL_TABLET | Freq: Every day | ORAL | 1 refills | Status: DC | PRN

## 2017-10-13 MED ORDER — HYDROCODONE-ACETAMINOPHEN 5-325 MG PO TABS: 1.0000 | ORAL_TABLET | ORAL | 0 refills | Status: DC | PRN

## 2017-10-13 MED ORDER — IOHEXOL 300 MG/ML  SOLN
INTRAMUSCULAR | Status: DC | PRN
  Administered 2017-10-13: 17 mL

## 2017-10-13 MED ORDER — LACTATED RINGERS IV SOLN
INTRAVENOUS | Status: DC
  Administered 2017-10-13: 1000 mL via INTRAVENOUS
  Administered 2017-10-13: 12:00:00 via INTRAVENOUS

## 2017-10-13 MED ORDER — OXYBUTYNIN CHLORIDE ER 10 MG PO TB24: 10.0000 mg | ORAL_TABLET | Freq: Every day | ORAL | 0 refills | Status: AC

## 2017-10-13 MED ORDER — DEXAMETHASONE SODIUM PHOSPHATE 10 MG/ML IJ SOLN
INTRAMUSCULAR | Status: DC | PRN
  Administered 2017-10-13: 10 mg via INTRAVENOUS

## 2017-10-13 MED ORDER — SUCCINYLCHOLINE CHLORIDE 200 MG/10ML IV SOSY
PREFILLED_SYRINGE | INTRAVENOUS | Status: AC
  Filled 2017-10-13: qty 10

## 2017-10-13 MED ORDER — FENTANYL CITRATE (PF) 100 MCG/2ML IJ SOLN
INTRAMUSCULAR | Status: AC
  Filled 2017-10-13: qty 2

## 2017-10-13 MED ORDER — FENTANYL CITRATE (PF) 100 MCG/2ML IJ SOLN: 25.0000 ug | INTRAMUSCULAR | Status: DC | PRN

## 2017-10-13 MED ORDER — LIDOCAINE 2% (20 MG/ML) 5 ML SYRINGE
INTRAMUSCULAR | Status: AC
  Filled 2017-10-13: qty 5

## 2017-10-13 MED ORDER — SODIUM CHLORIDE 0.9 % IR SOLN
Status: DC | PRN
  Administered 2017-10-13: 21000 mL via INTRAVESICAL

## 2017-10-13 MED ORDER — DEXAMETHASONE SODIUM PHOSPHATE 10 MG/ML IJ SOLN
INTRAMUSCULAR | Status: AC
  Filled 2017-10-13: qty 1

## 2017-10-13 MED ORDER — SUGAMMADEX SODIUM 200 MG/2ML IV SOLN
INTRAVENOUS | Status: DC | PRN
Start: 2017-10-13 — End: 2017-10-13
  Administered 2017-10-13: 200 mg via INTRAVENOUS

## 2017-10-13 MED ORDER — CIPROFLOXACIN HCL 500 MG PO TABS: 500.0000 mg | ORAL_TABLET | Freq: Two times a day (BID) | ORAL | 0 refills | Status: AC

## 2017-10-13 MED ORDER — PHENYLEPHRINE 40 MCG/ML (10ML) SYRINGE FOR IV PUSH (FOR BLOOD PRESSURE SUPPORT)
PREFILLED_SYRINGE | INTRAVENOUS | Status: AC
  Filled 2017-10-13: qty 10

## 2017-10-13 MED ORDER — LIDOCAINE HCL (CARDIAC) 20 MG/ML IV SOLN
INTRAVENOUS | Status: DC | PRN
  Administered 2017-10-13: 50 mg via INTRAVENOUS

## 2017-10-13 SURGICAL SUPPLY — 20 items
BAG URINE DRAINAGE (UROLOGICAL SUPPLIES) IMPLANT
BAG URO CATCHER STRL LF (MISCELLANEOUS) ×3 IMPLANT
CATH INTERMIT  6FR 70CM (CATHETERS) ×3 IMPLANT
CLOTH BEACON ORANGE TIMEOUT ST (SAFETY) ×3 IMPLANT
COVER FOOTSWITCH UNIV (MISCELLANEOUS) ×1 IMPLANT
COVER SURGICAL LIGHT HANDLE (MISCELLANEOUS) ×3 IMPLANT
ELECT REM PT RETURN 15FT ADLT (MISCELLANEOUS) ×3 IMPLANT
GLOVE BIOGEL M STRL SZ7.5 (GLOVE) ×3 IMPLANT
GOWN STRL REUS W/TWL LRG LVL3 (GOWN DISPOSABLE) ×6 IMPLANT
GOWN STRL REUS W/TWL XL LVL3 (GOWN DISPOSABLE) ×3 IMPLANT
GUIDEWIRE STR DUAL SENSOR (WIRE) ×3 IMPLANT
LOOP CUT BIPOLAR 24F LRG (ELECTROSURGICAL) ×1 IMPLANT
MANIFOLD NEPTUNE II (INSTRUMENTS) ×3 IMPLANT
PACK CYSTO (CUSTOM PROCEDURE TRAY) ×3 IMPLANT
SET ASPIRATION TUBING (TUBING) IMPLANT
STENT CONTOUR 6FRX26X.038 (STENTS) ×1 IMPLANT
STENT PERCUFLEX 4.8FRX26 (STENTS) ×1 IMPLANT
STRIP CLOSURE SKIN 1/2X4 (GAUZE/BANDAGES/DRESSINGS) ×1 IMPLANT
SYRINGE IRR TOOMEY STRL 70CC (SYRINGE) IMPLANT
TUBING CONNECTING 10 (TUBING) ×3 IMPLANT

## 2017-10-13 NOTE — Interval H&P Note (Signed)
History and Physical Interval Note:  10/13/2017 10:21 AM  Raymond Swanson  has presented today for surgery, with the diagnosis of BLADDER TUMOR  The various methods of treatment have been discussed with the patient and family. After consideration of risks, benefits and other options for treatment, the patient has consented to  Procedure(s) with comments: TRANSURETHRAL RESECTION OF BLADDER TUMOR/ BIPOLAR (TURBT) (N/A) - ONLY NEEDS 90 MIN FOR BOTH PROCEDURES CYSTOSCOPY WITH STENT REPLACEMENT (Right) as a surgical intervention .  The patient's history has been reviewed, patient examined, no change in status, stable for surgery.  I have reviewed the patient's chart and labs.  Questions were answered to the patient's satisfaction.     Conception Oms Raymond Swanson

## 2017-10-13 NOTE — Anesthesia Preprocedure Evaluation (Addendum)
Anesthesia Evaluation  Patient identified by MRN, date of birth, ID band Patient awake    Reviewed: Allergy & Precautions, NPO status , Patient's Chart, lab work & pertinent test results  Airway Mallampati: I  TM Distance: >3 FB Neck ROM: Full    Dental  (+) Dental Advisory Given, Missing, Poor Dentition,    Pulmonary former smoker,    breath sounds clear to auscultation       Cardiovascular hypertension, + CAD and + CABG   Rhythm:Regular Rate:Normal + Systolic murmurs    Neuro/Psych negative neurological ROS  negative psych ROS   GI/Hepatic Neg liver ROS, PUD, GERD  Medicated,  Endo/Other  Hypothyroidism   Renal/GU negative Renal ROS  negative genitourinary   Musculoskeletal negative musculoskeletal ROS (+)   Abdominal Normal abdominal exam  (+)   Peds  Hematology  (+) anemia ,   Anesthesia Other Findings   Reproductive/Obstetrics                           Anesthesia Physical Anesthesia Plan  ASA: III  Anesthesia Plan: General   Post-op Pain Management:    Induction: Intravenous  PONV Risk Score and Plan: 3 and Ondansetron, Dexamethasone and Midazolam  Airway Management Planned: Oral ETT  Additional Equipment: None  Intra-op Plan:   Post-operative Plan: Extubation in OR  Informed Consent: I have reviewed the patients History and Physical, chart, labs and discussed the procedure including the risks, benefits and alternatives for the proposed anesthesia with the patient or authorized representative who has indicated his/her understanding and acceptance.   Dental advisory given  Plan Discussed with: CRNA  Anesthesia Plan Comments:        Anesthesia Quick Evaluation

## 2017-10-13 NOTE — Anesthesia Procedure Notes (Signed)
Procedure Name: Intubation Date/Time: 10/13/2017 10:38 AM Performed by: Glory Buff, CRNA Pre-anesthesia Checklist: Patient identified, Emergency Drugs available, Suction available and Patient being monitored Patient Re-evaluated:Patient Re-evaluated prior to induction Oxygen Delivery Method: Circle system utilized Preoxygenation: Pre-oxygenation with 100% oxygen Induction Type: IV induction Ventilation: Mask ventilation without difficulty Laryngoscope Size: Mac and 4 Grade View: Grade I Tube type: Oral Tube size: 7.5 mm Number of attempts: 1 Airway Equipment and Method: Stylet and Oral airway Placement Confirmation: ETT inserted through vocal cords under direct vision,  positive ETCO2 and breath sounds checked- equal and bilateral Secured at: 21 cm Tube secured with: Tape Dental Injury: Teeth and Oropharynx as per pre-operative assessment  Comments: Placed by Dene Gentry SRNA

## 2017-10-13 NOTE — Transfer of Care (Signed)
Immediate Anesthesia Transfer of Care Note  Patient: Raymond Swanson  Procedure(s) Performed: TRANSURETHRAL RESECTION OF BLADDER TUMOR/ BIPOLAR (TURBT) (N/A ) CYSTOSCOPY WITH STENT REPLACEMENT (Right )  Patient Location: PACU  Anesthesia Type:General  Level of Consciousness: awake, alert  and oriented  Airway & Oxygen Therapy: Patient Spontanous Breathing and Patient connected to face mask oxygen  Post-op Assessment: Report given to RN and Post -op Vital signs reviewed and stable  Post vital signs: Reviewed and stable  Last Vitals:  Vitals:   10/13/17 0759  BP: (!) 160/80  Pulse: 96  Resp: 18  Temp: 36.7 C  SpO2: 98%    Last Pain:  Vitals:   10/13/17 0759  TempSrc: Oral      Patients Stated Pain Goal: 4 (26/94/85 4627)  Complications: No apparent anesthesia complications

## 2017-10-13 NOTE — Op Note (Signed)
Operative Note  Preoperative diagnosis:  1.  6 cm papillary bladder tumor involving the right lateral bladder wall, adjacent to the right ureteral orifice 2.  History of prostate cancer with rising PSA  Postoperative diagnosis: 1.  1.  6 cm papillary bladder tumor involving the right lateral bladder wall, adjacent to the right ureteral orifice 2.  History of prostate cancer with rising PSA 3.  Uniformly stenotic right ureter  Procedure(s): 1.  Cystoscopy with right retrograde pyelogram with intraoperative interpretation of fluoroscopic imaging 2.  TURBT large 3.  Right JJ stent placement  Surgeon: Ellison Hughs, MD  Assistants: None  Anesthesia: General endotracheal  Complications: None  EBL: 50 mL  Specimens: 1.  Superficial and deep bladder tumor margins  Drains/Catheters: 1.  20 French three-way Foley catheter with 20 mL and the balloon 2.  4.8 French right JJ stent with tether  Intraoperative findings:   1.  Large papillary bladder tumor involving the right lateral bladder wall adjacent to the lateral aspect of the right ureteral orifice, concerning for urothelial carcinoma and possibly recurrent prostate cancer 2.  Uniformly stenotic right ureter that could not accommodate a 6 French ureteral catheter nor a 6 [place] JJ stent with no other filling defects seen within the right renal pelvis and its associated calyces.  Indication:  Raymond Swanson is a 84 y.o. male with recurrent episodes of painless gross hematuria starting in December 2018.  He underwent a cystoscopy in the office and was found to have a large papillary bladder tumor concerning for malignancy.  He also has a long history of prostate cancer treated via RRP in 1990 and was recently found to have a markedly elevated PSA.  He had a re-staging CT and bone scan that was negative for signs of distant metastasis, but did note the large bladder lesion seen on cystoscopy.    From a voiding standpoint, he feels  like he is emptying his bladder well and denies pain or discomfort with urination.   Description of procedure:  After informed consent was obtained, the patient was brought to the operating room and general endotracheal anesthesia was administered. The patient was then placed in the dorsolithotomy position and prepped and draped in usual sterile fashion. A timeout was performed. A 21 French rigid cystoscope was then inserted into the urethral meatus and advanced into the bladder under direct vision. A complete bladder survey revealed the bladder tumor with details listed above.  The rigid cystoscope was then exchanged for a 26 French resectoscope with a bipolar loop working element.  The bladder tumor was then systematically resected down to its bladder base.  The superficial aspect of the tumor was then irrigated through the sheath of the resectoscope and sent for pathologic analysis as a separate specimen.  The deep margins of the tumor was then resected down to the detrusor musculature and also sent as a separate specimen.  The bladder tumor resection bed was then extensively fulgurated until hemostasis was achieved.  Given that the tumor was adjacent to the right ureteral orifice, but uninvolved by tumor, a right retrograde pyelogram was obtained that showed uniform stenosis of the entire length of the right ureter.  The 6 [place] open-ended catheter could not be advanced beyond the midportion of the right ureter.  An attempt was then made to place a tapered 6 [place] JJ stent, but it cannot be advanced beyond the midportion of the ureter either.  I was able to place a 4.8 [place] JJ stent  over the wire and into good position within the right collecting system, confirming placement by fluoroscopy.  The cystoscope was then removed under direct vision, leaving the tether of the stent in place.  A 20 French three-way Foley catheter was then placed in the tether of the stent secured to it.  The catheter was  then placed on continuous bladder irrigation with return of clear irrigant following hand irrigation.  The patient tolerated the procedure well and was transferred to the postanesthesia unit in stable condition.  Plan:  CBI in PACU.  Follow-up in 1 week for a voiding trial and stent removal.

## 2017-10-14 ENCOUNTER — Encounter (HOSPITAL_COMMUNITY): Payer: Self-pay | Admitting: Urology

## 2017-10-14 NOTE — Anesthesia Postprocedure Evaluation (Signed)
Anesthesia Post Note  Patient: DEWITTE VANNICE  Procedure(s) Performed: TRANSURETHRAL RESECTION OF BLADDER TUMOR/ BIPOLAR (TURBT) (N/A ) CYSTOSCOPY WITH STENT REPLACEMENT (Right )     Patient location during evaluation: PACU Anesthesia Type: General Level of consciousness: awake and alert Pain management: pain level controlled Vital Signs Assessment: post-procedure vital signs reviewed and stable Respiratory status: spontaneous breathing, nonlabored ventilation, respiratory function stable and patient connected to nasal cannula oxygen Cardiovascular status: blood pressure returned to baseline and stable Postop Assessment: no apparent nausea or vomiting Anesthetic complications: no    Last Vitals:  Vitals:   10/13/17 1315 10/13/17 1324  BP: 134/61 (!) 143/70  Pulse: 80 94  Resp: 13 14  Temp: 36.6 C (!) 36.3 C  SpO2: 93% 92%    Last Pain:  Vitals:   10/13/17 1315  TempSrc:   PainSc: 0-No pain                 Effie Berkshire

## 2017-10-21 DIAGNOSIS — C672 Malignant neoplasm of lateral wall of bladder: Secondary | ICD-10-CM | POA: Diagnosis not present

## 2017-10-21 DIAGNOSIS — R310 Gross hematuria: Secondary | ICD-10-CM | POA: Diagnosis not present

## 2017-10-21 DIAGNOSIS — R972 Elevated prostate specific antigen [PSA]: Secondary | ICD-10-CM | POA: Diagnosis not present

## 2017-10-21 DIAGNOSIS — C61 Malignant neoplasm of prostate: Secondary | ICD-10-CM | POA: Diagnosis not present

## 2017-10-26 ENCOUNTER — Ambulatory Visit: Payer: Medicare Other | Admitting: Internal Medicine

## 2017-11-01 ENCOUNTER — Telehealth: Payer: Self-pay | Admitting: Cardiology

## 2017-11-01 NOTE — Telephone Encounter (Signed)
Closed Encounter  °

## 2017-11-02 ENCOUNTER — Ambulatory Visit: Payer: Medicare Other | Admitting: Internal Medicine

## 2017-11-02 ENCOUNTER — Encounter: Payer: Self-pay | Admitting: Internal Medicine

## 2017-11-02 VITALS — BP 150/60 | HR 60 | Temp 97.9°F | Wt 137.4 lb

## 2017-11-02 DIAGNOSIS — I10 Essential (primary) hypertension: Secondary | ICD-10-CM

## 2017-11-02 DIAGNOSIS — E039 Hypothyroidism, unspecified: Secondary | ICD-10-CM | POA: Diagnosis not present

## 2017-11-02 DIAGNOSIS — I2510 Atherosclerotic heart disease of native coronary artery without angina pectoris: Secondary | ICD-10-CM

## 2017-11-02 DIAGNOSIS — G2581 Restless legs syndrome: Secondary | ICD-10-CM

## 2017-11-02 DIAGNOSIS — Z8546 Personal history of malignant neoplasm of prostate: Secondary | ICD-10-CM | POA: Diagnosis not present

## 2017-11-02 NOTE — Progress Notes (Signed)
Subjective:    Patient ID: Raymond Swanson, male    DOB: 16-Dec-1924, 84 y.o.   MRN: [id number]  HPI  84 year old patient who was seen today for his six-month follow-up.  He has been followed by urology recently due to gross hematuria.  He has had bladder cancer recently diagnosed and is scheduled for urology follow-up soon.  He also has a history of prostate cancer and an increase in PSA.  He has been treated for restless leg syndrome which has done quite well on Mirapex.  He still has some insomnia issues but this seems to be stable.  He has coronary artery disease remains asymptomatic. He has urinary frequency and is on oxybutynin.  He does complain of some dry mouth.  He has hypothyroidism.  Past Medical History:  Diagnosis Date  . ACUT GASTR ULCER W/HEMORR W/O MENTION OBST 09/10/2009  . ANEMIA 10/08/2009  . Cancer ([place])   . CORONARY ARTERY DISEASE 01/26/2007  . Fall 2016   fall from deck while cleaning gutters; sustained subdemral hematoma ; doing ok now   . History of kidney stones   . HOH (hard of hearing)   . HYPERLIPIDEMIA 01/26/2007  . HYPERTENSION 01/26/2007  . HYPOTHYROIDISM 10/18/2007  . MELENA 08/28/2009  . PROSTATE CANCER, HX OF 01/26/2007  . SKIN CANCER, HX OF 01/26/2007     Social History   Socioeconomic History  . Marital status: Married    Spouse name: Not on file  . Number of children: Not on file  . Years of education: Not on file  . Highest education level: Not on file  Social Needs  . Financial resource strain: Not on file  . Food insecurity - worry: Not on file  . Food insecurity - inability: Not on file  . Transportation needs - medical: Not on file  . Transportation needs - non-medical: Not on file  Occupational History  . Not on file  Tobacco Use  . Smoking status: Former Smoker    Packs/day: 1.00    Years: 20.00    Pack years: 20.00    Types: Cigarettes    Last attempt to quit: 10/28/1948    Years since quitting: 69.0  . Smokeless tobacco: Never Used    Substance and Sexual Activity  . Alcohol use: Yes    Comment: 4 oz. wine daily  . Drug use: No  . Sexual activity: Not on file  Other Topics Concern  . Not on file  Social History Narrative  . Not on file    Past Surgical History:  Procedure Laterality Date  . CATARACT EXTRACTION    . CORONARY ARTERY BYPASS GRAFT     1991; reports at PAT appt 10-11-17: i went to my priamry doctor for a physical and had aroutine stress test that was bad, was sent for cath ( more than 1 but nsure how many or if stents were placed), he rprots " that didnt work so they did the surgery". denies heart symptoms for over 20 years;  sees  cardiology Hochrein annually for EKGs   . CYSTOSCOPY W/ URETERAL STENT PLACEMENT Right 10/13/2017   Procedure: CYSTOSCOPY WITH STENT REPLACEMENT;  Surgeon: Ceasar Mons, MD;  Location: [facility];  Service: Urology;  Laterality: Right;  . EXCISION MASS UPPER EXTREMETIES Left 08/11/2016   Procedure: EXCISION MASS LEFT SHOULDER, LEFT FOREARM, LEFT WRIST;  Surgeon: Judeth Horn, MD;  Location: [place];  Service: General;  Laterality: Left;  . HERNIA REPAIR     ingunial  .  MOHS SURGERY    . PROSTATE SURGERY     prostatectomy  . TRANSURETHRAL RESECTION OF BLADDER TUMOR N/A 10/13/2017   Procedure: TRANSURETHRAL RESECTION OF BLADDER TUMOR/ BIPOLAR (TURBT);  Surgeon: Ceasar Mons, MD;  Location: [facility];  Service: Urology;  Laterality: N/A;  ONLY NEEDS 90 MIN FOR BOTH PROCEDURES    Family History  Problem Relation Age of Onset  . Lung cancer Brother 9  . GI problems Sister     No Known Allergies  Current Outpatient Medications on File Prior to Visit  Medication Sig Dispense Refill  . acetaminophen (TYLENOL) 325 MG tablet Take 1 tablet (325 mg total) by mouth every 6 (six) hours as needed. (Patient taking differently: Take 325 mg by mouth every 6 (six) hours as needed. )    . aspirin EC 81 MG tablet Take 81 mg by mouth daily.    Marland Kitchen b complex vitamins tablet  Take 1 tablet by mouth daily.    . calcium gluconate 500 MG tablet Take 500 mg by mouth daily.      . carboxymethylcellulose (REFRESH TEARS) 0.5 % SOLN Place 1 drop into both eyes 3 (three) times daily as needed (for dry/irritated eyes).    . cholecalciferol (VITAMIN D) 1000 UNITS tablet Take 1,000 Units by mouth daily.      Marland Kitchen docusate sodium (COLACE) 100 MG capsule Take 1 capsule (100 mg total) by mouth 2 (two) times daily. 60 capsule 0  . famotidine (PEPCID) 20 MG tablet Take 1 tablet (20 mg total) by mouth 2 (two) times daily. 60 tablet 0  . HYDROcodone-acetaminophen (NORCO) 5-325 MG tablet Take 1 tablet by mouth every 4 (four) hours as needed for moderate pain. 20 tablet 0  . levothyroxine (SYNTHROID, LEVOTHROID) 50 MCG tablet Take 1 tablet (50 mcg total) by mouth daily. 90 tablet 4  . Melatonin 5 MG TABS Take 2.5 mg by mouth at bedtime as needed (sleep).     . Multiple Vitamin (MULTIVITAMIN WITH MINERALS) TABS tablet Take 1 tablet by mouth daily.    . Multiple Vitamins-Minerals (PRESERVISION/LUTEIN PO) Take 2 tablets by mouth daily. Daily       . nitroGLYCERIN (NITROSTAT) 0.4 MG SL tablet Place 1 tablet (0.4 mg total) under the tongue every 5 (five) minutes as needed. (Patient taking differently: Place 0.4 mg under the tongue every 5 (five) minutes as needed for chest pain. ) 20 tablet 5  . ondansetron (ZOFRAN) 4 MG tablet Take 1 tablet (4 mg total) by mouth daily as needed for nausea or vomiting. 30 tablet 1  . oxybutynin (DITROPAN XL) 10 MG 24 hr tablet Take 1 tablet (10 mg total) by mouth daily. 30 tablet 0  . phenazopyridine (PYRIDIUM) 200 MG tablet Take 1 tablet (200 mg total) by mouth 3 (three) times daily as needed for pain. 30 tablet 0  . polyethylene glycol (MIRALAX / GLYCOLAX) packet Take 17 g by mouth 2 (two) times daily. 60 each 0  . pramipexole (MIRAPEX) 0.125 MG tablet 2 tablets  2 hours prior to bedtime (Patient taking differently: Take 0.25 mg by mouth at bedtime. 2 tablets  2  hours prior to bedtime) 180 tablet 4  . simvastatin (ZOCOR) 20 MG tablet Take 1 tablet (20 mg total) by mouth daily. (Patient taking differently: Take 20 mg by mouth every evening. ) 90 tablet 3   No current facility-administered medications on file prior to visit.     BP (!) 150/60 (BP Location: Left Arm, Patient Position: Sitting,  Cuff Size: Normal)   Pulse 60   Temp 97.9 F (36.6 C) (Oral)   Wt 137 lb 6.4 oz (62.3 kg)   SpO2 96%   BMI 20.89 kg/m      Review of Systems  Constitutional: Negative for appetite change, chills, fatigue and fever.  HENT: Negative for congestion, dental problem, ear pain, hearing loss, sore throat, tinnitus, trouble swallowing and voice change.        Dry mouth  Eyes: Negative for pain, discharge and visual disturbance.  Respiratory: Negative for cough, chest tightness, wheezing and stridor.   Cardiovascular: Negative for chest pain, palpitations and leg swelling.  Gastrointestinal: Negative for abdominal distention, abdominal pain, blood in stool, constipation, diarrhea, nausea and vomiting.  Genitourinary: Negative for difficulty urinating, discharge, flank pain, genital sores, hematuria and urgency.  Musculoskeletal: Positive for back pain and gait problem. Negative for arthralgias, joint swelling, myalgias and neck stiffness.  Skin: Negative for rash.  Neurological: Negative for dizziness, syncope, speech difficulty, weakness, numbness and headaches.  Hematological: Negative for adenopathy. Does not bruise/bleed easily.  Psychiatric/Behavioral: Positive for sleep disturbance. Negative for behavioral problems and dysphoric mood. The patient is not nervous/anxious.        Objective:   Physical Exam  Constitutional: He is oriented to person, place, and time. He appears well-developed.  HENT:  Head: Normocephalic.  Right Ear: External ear normal.  Left Ear: External ear normal.  Eyes: Conjunctivae and EOM are normal.  Neck: Normal range of  motion.  Cardiovascular: Normal rate and normal heart sounds.  Pulmonary/Chest: Breath sounds normal.  Abdominal: Bowel sounds are normal.  Musculoskeletal: Normal range of motion. He exhibits no edema or tenderness.  Neurological: He is alert and oriented to person, place, and time.  Psychiatric: He has a normal mood and affect. His behavior is normal.          Assessment & Plan:   Recent diagnosis of bladder cancer.  Follow-up urology Prostate cancer Restless leg syndrome stable Hypothyroidism. Coronary artery disease.  Asymptomatic  Follow-up urology CPX 6 months with follow-up lab  [facility]

## 2017-11-02 NOTE — Patient Instructions (Signed)
Urology follow-up as scheduled  Return in 6 months for follow-up or as needed

## 2017-11-13 ENCOUNTER — Inpatient Hospital Stay (HOSPITAL_COMMUNITY)
Admission: EM | Admit: 2017-11-13 | Discharge: 2017-11-18 | DRG: 193 | Disposition: A | Discharge status: Skilled Nursing Facility | Payer: Medicare Other | Attending: Family Medicine | Admitting: Family Medicine

## 2017-11-13 ENCOUNTER — Encounter (HOSPITAL_COMMUNITY): Payer: Self-pay

## 2017-11-13 ENCOUNTER — Emergency Department (HOSPITAL_COMMUNITY): Payer: Medicare Other

## 2017-11-13 DIAGNOSIS — Z515 Encounter for palliative care: Secondary | ICD-10-CM

## 2017-11-13 DIAGNOSIS — B952 Enterococcus as the cause of diseases classified elsewhere: Secondary | ICD-10-CM | POA: Diagnosis present

## 2017-11-13 DIAGNOSIS — W19XXXA Unspecified fall, initial encounter: Secondary | ICD-10-CM | POA: Diagnosis present

## 2017-11-13 DIAGNOSIS — R7881 Bacteremia: Secondary | ICD-10-CM | POA: Diagnosis not present

## 2017-11-13 DIAGNOSIS — I350 Nonrheumatic aortic (valve) stenosis: Secondary | ICD-10-CM

## 2017-11-13 DIAGNOSIS — E785 Hyperlipidemia, unspecified: Secondary | ICD-10-CM | POA: Diagnosis present

## 2017-11-13 DIAGNOSIS — F05 Delirium due to known physiological condition: Secondary | ICD-10-CM | POA: Diagnosis present

## 2017-11-13 DIAGNOSIS — R0602 Shortness of breath: Secondary | ICD-10-CM | POA: Diagnosis not present

## 2017-11-13 DIAGNOSIS — E039 Hypothyroidism, unspecified: Secondary | ICD-10-CM | POA: Diagnosis present

## 2017-11-13 DIAGNOSIS — J9601 Acute respiratory failure with hypoxia: Secondary | ICD-10-CM | POA: Diagnosis not present

## 2017-11-13 DIAGNOSIS — D649 Anemia, unspecified: Secondary | ICD-10-CM | POA: Diagnosis not present

## 2017-11-13 DIAGNOSIS — M6389 Disorders of muscle in diseases classified elsewhere, multiple sites: Secondary | ICD-10-CM | POA: Diagnosis not present

## 2017-11-13 DIAGNOSIS — Z7982 Long term (current) use of aspirin: Secondary | ICD-10-CM | POA: Diagnosis not present

## 2017-11-13 DIAGNOSIS — Z79890 Hormone replacement therapy (postmenopausal): Secondary | ICD-10-CM

## 2017-11-13 DIAGNOSIS — C674 Malignant neoplasm of posterior wall of bladder: Secondary | ICD-10-CM | POA: Diagnosis not present

## 2017-11-13 DIAGNOSIS — H9190 Unspecified hearing loss, unspecified ear: Secondary | ICD-10-CM | POA: Diagnosis present

## 2017-11-13 DIAGNOSIS — I2510 Atherosclerotic heart disease of native coronary artery without angina pectoris: Secondary | ICD-10-CM | POA: Diagnosis not present

## 2017-11-13 DIAGNOSIS — F0390 Unspecified dementia without behavioral disturbance: Secondary | ICD-10-CM | POA: Diagnosis present

## 2017-11-13 DIAGNOSIS — J9811 Atelectasis: Secondary | ICD-10-CM | POA: Diagnosis present

## 2017-11-13 DIAGNOSIS — I130 Hypertensive heart and chronic kidney disease with heart failure and stage 1 through stage 4 chronic kidney disease, or unspecified chronic kidney disease: Secondary | ICD-10-CM | POA: Diagnosis present

## 2017-11-13 DIAGNOSIS — R748 Abnormal levels of other serum enzymes: Secondary | ICD-10-CM | POA: Diagnosis present

## 2017-11-13 DIAGNOSIS — Z66 Do not resuscitate: Secondary | ICD-10-CM | POA: Diagnosis present

## 2017-11-13 DIAGNOSIS — Z8546 Personal history of malignant neoplasm of prostate: Secondary | ICD-10-CM | POA: Diagnosis not present

## 2017-11-13 DIAGNOSIS — E876 Hypokalemia: Secondary | ICD-10-CM | POA: Diagnosis present

## 2017-11-13 DIAGNOSIS — R0989 Other specified symptoms and signs involving the circulatory and respiratory systems: Secondary | ICD-10-CM | POA: Diagnosis not present

## 2017-11-13 DIAGNOSIS — S299XXA Unspecified injury of thorax, initial encounter: Secondary | ICD-10-CM | POA: Diagnosis not present

## 2017-11-13 DIAGNOSIS — R32 Unspecified urinary incontinence: Secondary | ICD-10-CM | POA: Diagnosis not present

## 2017-11-13 DIAGNOSIS — Z9849 Cataract extraction status, unspecified eye: Secondary | ICD-10-CM

## 2017-11-13 DIAGNOSIS — R0902 Hypoxemia: Secondary | ICD-10-CM

## 2017-11-13 DIAGNOSIS — Z85828 Personal history of other malignant neoplasm of skin: Secondary | ICD-10-CM

## 2017-11-13 DIAGNOSIS — R404 Transient alteration of awareness: Secondary | ICD-10-CM | POA: Diagnosis not present

## 2017-11-13 DIAGNOSIS — R509 Fever, unspecified: Secondary | ICD-10-CM | POA: Diagnosis not present

## 2017-11-13 DIAGNOSIS — R7989 Other specified abnormal findings of blood chemistry: Secondary | ICD-10-CM | POA: Diagnosis not present

## 2017-11-13 DIAGNOSIS — J209 Acute bronchitis, unspecified: Secondary | ICD-10-CM | POA: Diagnosis not present

## 2017-11-13 DIAGNOSIS — N183 Chronic kidney disease, stage 3 (moderate): Secondary | ICD-10-CM | POA: Diagnosis present

## 2017-11-13 DIAGNOSIS — I509 Heart failure, unspecified: Secondary | ICD-10-CM | POA: Diagnosis present

## 2017-11-13 DIAGNOSIS — K219 Gastro-esophageal reflux disease without esophagitis: Secondary | ICD-10-CM | POA: Diagnosis present

## 2017-11-13 DIAGNOSIS — R531 Weakness: Secondary | ICD-10-CM | POA: Diagnosis not present

## 2017-11-13 DIAGNOSIS — I160 Hypertensive urgency: Secondary | ICD-10-CM | POA: Diagnosis present

## 2017-11-13 DIAGNOSIS — I10 Essential (primary) hypertension: Secondary | ICD-10-CM | POA: Diagnosis present

## 2017-11-13 DIAGNOSIS — R05 Cough: Secondary | ICD-10-CM | POA: Diagnosis not present

## 2017-11-13 DIAGNOSIS — J189 Pneumonia, unspecified organism: Secondary | ICD-10-CM | POA: Diagnosis present

## 2017-11-13 DIAGNOSIS — R2681 Unsteadiness on feet: Secondary | ICD-10-CM | POA: Diagnosis not present

## 2017-11-13 DIAGNOSIS — N179 Acute kidney failure, unspecified: Secondary | ICD-10-CM | POA: Diagnosis present

## 2017-11-13 DIAGNOSIS — Z951 Presence of aortocoronary bypass graft: Secondary | ICD-10-CM | POA: Diagnosis present

## 2017-11-13 DIAGNOSIS — I352 Nonrheumatic aortic (valve) stenosis with insufficiency: Secondary | ICD-10-CM | POA: Diagnosis present

## 2017-11-13 DIAGNOSIS — W1789XA Other fall from one level to another, initial encounter: Secondary | ICD-10-CM | POA: Diagnosis present

## 2017-11-13 DIAGNOSIS — G8911 Acute pain due to trauma: Secondary | ICD-10-CM | POA: Diagnosis not present

## 2017-11-13 DIAGNOSIS — I351 Nonrheumatic aortic (valve) insufficiency: Secondary | ICD-10-CM | POA: Diagnosis not present

## 2017-11-13 DIAGNOSIS — Z79899 Other long term (current) drug therapy: Secondary | ICD-10-CM

## 2017-11-13 DIAGNOSIS — Z87891 Personal history of nicotine dependence: Secondary | ICD-10-CM

## 2017-11-13 DIAGNOSIS — R011 Cardiac murmur, unspecified: Secondary | ICD-10-CM | POA: Diagnosis not present

## 2017-11-13 DIAGNOSIS — R278 Other lack of coordination: Secondary | ICD-10-CM | POA: Diagnosis not present

## 2017-11-13 DIAGNOSIS — R062 Wheezing: Secondary | ICD-10-CM | POA: Diagnosis not present

## 2017-11-13 DIAGNOSIS — R41841 Cognitive communication deficit: Secondary | ICD-10-CM | POA: Diagnosis not present

## 2017-11-13 DIAGNOSIS — Z8551 Personal history of malignant neoplasm of bladder: Secondary | ICD-10-CM | POA: Diagnosis not present

## 2017-11-13 DIAGNOSIS — I700 Atherosclerosis of aorta: Secondary | ICD-10-CM | POA: Diagnosis not present

## 2017-11-13 DIAGNOSIS — R778 Other specified abnormalities of plasma proteins: Secondary | ICD-10-CM

## 2017-11-13 LAB — CBC WITH DIFFERENTIAL/PLATELET
BASOS ABS: 0 10*3/uL (ref 0.0–0.1)
Basophils Relative: 0 %
EOS ABS: 0 10*3/uL (ref 0.0–0.7)
Eosinophils Relative: 0 %
HCT: 32 % — ABNORMAL LOW (ref 39.0–52.0)
Hemoglobin: 9.7 g/dL — ABNORMAL LOW (ref 13.0–17.0)
Lymphocytes Relative: 7 %
Lymphs Abs: 0.7 10*3/uL (ref 0.7–4.0)
MCH: 26.1 pg (ref 26.0–34.0)
MCHC: 30.3 g/dL (ref 30.0–36.0)
MCV: 86.3 fL (ref 78.0–100.0)
Monocytes Absolute: 0.4 10*3/uL (ref 0.1–1.0)
Monocytes Relative: 4 %
NEUTROS PCT: 89 %
Neutro Abs: 8.2 10*3/uL — ABNORMAL HIGH (ref 1.7–7.7)
PLATELETS: 167 10*3/uL (ref 150–400)
RBC: 3.71 MIL/uL — AB (ref 4.22–5.81)
RDW: 14.7 % (ref 11.5–15.5)
WBC: 9.3 10*3/uL (ref 4.0–10.5)

## 2017-11-13 LAB — COMPREHENSIVE METABOLIC PANEL
ALBUMIN: 3.2 g/dL — AB (ref 3.5–5.0)
ALT: 17 U/L (ref 17–63)
AST: 39 U/L (ref 15–41)
Alkaline Phosphatase: 80 U/L (ref 38–126)
Anion gap: 10 (ref 5–15)
BILIRUBIN TOTAL: 0.6 mg/dL (ref 0.3–1.2)
BUN: 28 mg/dL — AB (ref 6–20)
CO2: 23 mmol/L (ref 22–32)
CREATININE: 1.78 mg/dL — AB (ref 0.61–1.24)
Calcium: 8.5 mg/dL — ABNORMAL LOW (ref 8.9–10.3)
Chloride: 107 mmol/L (ref 101–111)
GFR calc Af Amer: 37 mL/min — ABNORMAL LOW (ref >60)
GFR, EST NON AFRICAN AMERICAN: 32 mL/min — AB (ref >60)
GLUCOSE: 178 mg/dL — AB (ref 65–99)
POTASSIUM: 4 mmol/L (ref 3.5–5.1)
Sodium: 140 mmol/L (ref 135–145)
Total Protein: 6.4 g/dL — ABNORMAL LOW (ref 6.5–8.1)

## 2017-11-13 LAB — TROPONIN I: Troponin I: 0.21 ng/mL (ref <0.03)

## 2017-11-13 LAB — I-STAT CG4 LACTIC ACID, ED: Lactic Acid, Venous: 1.09 mmol/L (ref 0.5–1.9)

## 2017-11-13 MED ORDER — ACETAMINOPHEN 500 MG PO TABS
1000.0000 mg | ORAL_TABLET | Freq: Once | ORAL | Status: AC
  Administered 2017-11-13: 1000 mg via ORAL
  Filled 2017-11-13: qty 2

## 2017-11-13 MED ORDER — SODIUM CHLORIDE 0.9 % IV SOLN
1.0000 g | Freq: Once | INTRAVENOUS | Status: AC
  Administered 2017-11-13: 1 g via INTRAVENOUS
  Filled 2017-11-13: qty 10

## 2017-11-13 MED ORDER — SODIUM CHLORIDE 0.9 % IV BOLUS (SEPSIS)
1000.0000 mL | Freq: Once | INTRAVENOUS | Status: AC
  Administered 2017-11-13: 1000 mL via INTRAVENOUS

## 2017-11-13 MED ORDER — SODIUM CHLORIDE 0.9 % IV SOLN
500.0000 mg | Freq: Once | INTRAVENOUS | Status: AC
  Administered 2017-11-13: 500 mg via INTRAVENOUS
  Filled 2017-11-13: qty 500

## 2017-11-13 MED ORDER — ASPIRIN 81 MG PO CHEW
324.0000 mg | CHEWABLE_TABLET | Freq: Once | ORAL | Status: AC
  Administered 2017-11-13: 324 mg via ORAL
  Filled 2017-11-13: qty 4

## 2017-11-13 NOTE — ED Notes (Signed)
Nurse collecting labs. 

## 2017-11-13 NOTE — Progress Notes (Signed)
Pharmacy Code Sepsis Protocol  Time of code sepsis page: 2102 []  Antibiotics delivered at n/a    Were antibiotics ordered at the time of the code sepsis page? No Was it required to contact the physician? []  Physician not contacted [x]  Physician contacted at 2150 to order antibiotics for code sepsis []  Physician contacted to recommend changing antibiotics   Pharmacy consulted for: n/a- no orders entered yet  Anti-infectives (From admission, onward)   None        [facility]

## 2017-11-13 NOTE — ED Notes (Signed)
Lab called with critical Troponin 0.21.  Results called to Dr. Gilford Raid.

## 2017-11-13 NOTE — ED Triage Notes (Signed)
To room via EMS.  Onset 45 min PTA pt fell and was too weak to get up.  No head injury.  EMS put pt on 2L oxygen via [place] for O2 sats 86% on RA.

## 2017-11-13 NOTE — ED Provider Notes (Signed)
[facility] EMERGENCY DEPARTMENT Provider Note   CSN: [id number] Arrival date & time: 11/13/17  2053     History   Chief Complaint Chief Complaint  Patient presents with  . Fall  . Fever    HPI Raymond Swanson is a 84 y.o. male.  Pt presents to the ED today with weakness.  He lives at home with his wife and fell about 45 min pta.  He was too weak to get up, so his wife called 911.  The pt did not hit his head.  When EMS arrived, his O2 sats were 86% on RA.  The pt was placed on oxygen.  Pt unaware of fevers.  He is HOH and is a poor historian.     Past Medical History:  Diagnosis Date  . ACUT GASTR ULCER W/HEMORR W/O MENTION OBST 09/10/2009  . ANEMIA 10/08/2009  . Cancer ([place])   . CORONARY ARTERY DISEASE 01/26/2007  . Fall 2016   fall from deck while cleaning gutters; sustained subdemral hematoma ; doing ok now   . History of kidney stones   . HOH (hard of hearing)   . HYPERLIPIDEMIA 01/26/2007  . HYPERTENSION 01/26/2007  . HYPOTHYROIDISM 10/18/2007  . MELENA 08/28/2009  . PROSTATE CANCER, HX OF 01/26/2007  . SKIN CANCER, HX OF 01/26/2007    Patient Active Problem List   Diagnosis Date Noted  . Restless leg syndrome, controlled 04/27/2017  . Squamous cell carcinoma of multiple sites 08/25/2016  . Sleep disturbance 03/11/2016  . Traumatic intracranial subdural hematoma ([facility]) 01/28/2016  . Fall 01/27/2016  . Fracture of multiple ribs of left side 01/27/2016  . T8 vertebral fracture ([place]) 01/27/2016  . L1 vertebral fracture ([place]) 01/27/2016  . Lumbar transverse process fracture ([place]) 01/27/2016  . Pressure ulcer 01/26/2016  . Traumatic subdural hematoma with loss of consciousness of 30 minutes or less ([place]) 01/22/2016  . ANEMIA 10/08/2009  . ACUT GASTR ULCER W/HEMORR W/O MENTION OBST 09/10/2009  . MELENA 08/28/2009  . Hypothyroidism 10/18/2007  . Dyslipidemia 01/26/2007  . Essential hypertension 01/26/2007  . Coronary atherosclerosis 01/26/2007  .  PROSTATE CANCER, HX OF 01/26/2007  . SKIN CANCER, HX OF 01/26/2007    Past Surgical History:  Procedure Laterality Date  . CATARACT EXTRACTION    . CORONARY ARTERY BYPASS GRAFT     1991; reports at PAT appt 10-11-17: i went to my priamry doctor for a physical and had aroutine stress test that was bad, was sent for cath ( more than 1 but nsure how many or if stents were placed), he rprots " that didnt work so they did the surgery". denies heart symptoms for over 20 years;  sees  cardiology Hochrein annually for EKGs   . CYSTOSCOPY W/ URETERAL STENT PLACEMENT Right 10/13/2017   Procedure: CYSTOSCOPY WITH STENT REPLACEMENT;  Surgeon: Ceasar Mons, MD;  Location: [facility];  Service: Urology;  Laterality: Right;  . EXCISION MASS UPPER EXTREMETIES Left 08/11/2016   Procedure: EXCISION MASS LEFT SHOULDER, LEFT FOREARM, LEFT WRIST;  Surgeon: Judeth Horn, MD;  Location: [place];  Service: General;  Laterality: Left;  . HERNIA REPAIR     ingunial  . MOHS SURGERY    . PROSTATE SURGERY     prostatectomy  . TRANSURETHRAL RESECTION OF BLADDER TUMOR N/A 10/13/2017   Procedure: TRANSURETHRAL RESECTION OF BLADDER TUMOR/ BIPOLAR (TURBT);  Surgeon: Ceasar Mons, MD;  Location: [facility];  Service: Urology;  Laterality: N/A;  ONLY NEEDS 90 MIN FOR  BOTH PROCEDURES        Home Medications    Prior to Admission medications   Medication Sig Start Date End Date Taking? Authorizing Provider  acetaminophen (TYLENOL) 325 MG tablet Take 1 tablet (325 mg total) by mouth every 6 (six) hours as needed. Patient taking differently: Take 325 mg by mouth every 6 (six) hours as needed.  02/06/16   Love, Ivan Anchors, PA-C  aspirin EC 81 MG tablet Take 81 mg by mouth daily.    [provider]  b complex vitamins tablet Take 1 tablet by mouth daily.    [provider]  calcium gluconate 500 MG tablet Take 500 mg by mouth daily.      [provider]  carboxymethylcellulose (REFRESH  TEARS) 0.5 % SOLN Place 1 drop into both eyes 3 (three) times daily as needed (for dry/irritated eyes).    [provider]  cholecalciferol (VITAMIN D) 1000 UNITS tablet Take 1,000 Units by mouth daily.      [provider]  docusate sodium (COLACE) 100 MG capsule Take 1 capsule (100 mg total) by mouth 2 (two) times daily. 02/07/16   Love, Ivan Anchors, PA-C  famotidine (PEPCID) 20 MG tablet Take 1 tablet (20 mg total) by mouth 2 (two) times daily. 02/07/16   Love, Ivan Anchors, PA-C  HYDROcodone-acetaminophen (NORCO) 5-325 MG tablet Take 1 tablet by mouth every 4 (four) hours as needed for moderate pain. 10/13/17   Ceasar Mons, MD  levothyroxine (SYNTHROID, LEVOTHROID) 50 MCG tablet Take 1 tablet (50 mcg total) by mouth daily. 04/27/17   Marletta Lor, MD  Melatonin 5 MG TABS Take 2.5 mg by mouth at bedtime as needed (sleep).     [provider]  Multiple Vitamin (MULTIVITAMIN WITH MINERALS) TABS tablet Take 1 tablet by mouth daily.    [provider]  Multiple Vitamins-Minerals (PRESERVISION/LUTEIN PO) Take 2 tablets by mouth daily. Daily       [provider]  nitroGLYCERIN (NITROSTAT) 0.4 MG SL tablet Place 1 tablet (0.4 mg total) under the tongue every 5 (five) minutes as needed. Patient taking differently: Place 0.4 mg under the tongue every 5 (five) minutes as needed for chest pain.  12/31/15   Marletta Lor, MD  ondansetron [facility]) 4 MG tablet Take 1 tablet (4 mg total) by mouth daily as needed for nausea or vomiting. 10/13/17 10/13/18  Ceasar Mons, MD  phenazopyridine (PYRIDIUM) 200 MG tablet Take 1 tablet (200 mg total) by mouth 3 (three) times daily as needed for pain. 10/13/17 10/13/18  Ceasar Mons, MD  polyethylene glycol [facility] Floria Raveling) packet Take 17 g by mouth 2 (two) times daily. 02/07/16   Love, Ivan Anchors, PA-C  pramipexole (MIRAPEX) 0.125 MG tablet 2 tablets  2 hours prior to bedtime Patient  taking differently: Take 0.25 mg by mouth at bedtime. 2 tablets  2 hours prior to bedtime 07/08/17   Marletta Lor, MD  simvastatin (ZOCOR) 20 MG tablet Take 1 tablet (20 mg total) by mouth daily. Patient taking differently: Take 20 mg by mouth every evening.  04/27/17   Marletta Lor, MD    Family History Family History  Problem Relation Age of Onset  . Lung cancer Brother 35  . GI problems Sister     Social History Social History   Tobacco Use  . Smoking status: Former Smoker    Packs/day: 1.00    Years: 20.00    Pack years: 20.00  Types: Cigarettes    Last attempt to quit: 10/28/1948    Years since quitting: 69.0  . Smokeless tobacco: Never Used  Substance Use Topics  . Alcohol use: Yes    Comment: 4 oz. wine daily  . Drug use: No     Allergies   Patient has no known allergies.   Review of Systems Review of Systems  Neurological: Positive for weakness.  All other systems reviewed and are negative.    Physical Exam Updated Vital Signs BP 122/79   Pulse 100   Temp 99.7 F (37.6 C) (Oral)   Resp (!) 26   SpO2 96%   Physical Exam  Constitutional: He is oriented to person, place, and time. He appears distressed.  HENT:  Head: Normocephalic and atraumatic.  Right Ear: External ear normal.  Left Ear: External ear normal.  Nose: Nose normal.  Mouth/Throat: Mucous membranes are dry.  Eyes: Pupils are equal, round, and reactive to light. Conjunctivae and EOM are normal.  Neck: Normal range of motion. Neck supple.  Cardiovascular: Regular rhythm, normal heart sounds and intact distal pulses. Tachycardia present.  Pulmonary/Chest: Accessory muscle usage present. Tachypnea noted.  Abdominal: Soft. Bowel sounds are normal.  Musculoskeletal: Normal range of motion.  Neurological: He is alert and oriented to person, place, and time.  Skin: Skin is warm. Capillary refill takes less than 2 seconds.  Psychiatric: He has a normal mood and affect. His  behavior is normal. Judgment and thought content normal.  Nursing note and vitals reviewed.    ED Treatments / Results  Labs (all labs ordered are listed, but only abnormal results are displayed) Labs Reviewed  COMPREHENSIVE METABOLIC PANEL - Abnormal; Notable for the following components:      Result Value   Glucose, Bld 178 (*)    BUN 28 (*)    Creatinine, Ser 1.78 (*)    Calcium 8.5 (*)    Total Protein 6.4 (*)    Albumin 3.2 (*)    GFR calc non Af Amer 32 (*)    GFR calc Af Amer 37 (*)    All other components within normal limits  CBC WITH DIFFERENTIAL/PLATELET - Abnormal; Notable for the following components:   RBC 3.71 (*)    Hemoglobin 9.7 (*)    HCT 32.0 (*)    Neutro Abs 8.2 (*)    All other components within normal limits  TROPONIN I - Abnormal; Notable for the following components:   Troponin I 0.21 (*)    All other components within normal limits  CULTURE, BLOOD (ROUTINE X 2)  CULTURE, BLOOD (ROUTINE X 2)  URINE CULTURE  INFLUENZA PANEL BY PCR (TYPE A & B)  URINALYSIS, ROUTINE W REFLEX MICROSCOPIC  URINALYSIS, ROUTINE W REFLEX MICROSCOPIC  I-STAT CG4 LACTIC ACID, ED  I-STAT CG4 LACTIC ACID, ED    EKG EKG Interpretation  Date/Time:  Saturday November 13 2017 22:47:05 EDT Ventricular Rate:  109 PR Interval:    QRS Duration: 108 QT Interval:  345 QTC Calculation: 465 R Axis:   55 Text Interpretation:  Sinus tachycardia Confirmed by Isla Pence [phone]) on 11/13/2017 10:49:25 PM   Radiology Dg Chest Port 1 View  Result Date: 11/13/2017 CLINICAL DATA:  Patient fell and was too weak to get up.  Dyspnea. EXAM: PORTABLE CHEST 1 VIEW COMPARISON:  01/23/2016 FINDINGS: Heart size and mediastinal contours are stable with post CABG change and moderate aortic atherosclerosis. Bibasilar atelectasis is redemonstrated without acute pneumonic consolidation, effusion or pneumothorax. Chronic left-sided  rib fractures involving the posterior left sixth and seventh ribs.  IMPRESSION: Persistent bibasilar atelectasis. Aortic atherosclerosis with borderline cardiomegaly. Chronic left-sided rib fractures. Electronically Signed   By: Ashley Royalty M.D.   On: 11/13/2017 21:38    Procedures Procedures (including critical care time)  Medications Ordered in ED Medications  sodium chloride 0.9 % bolus 1,000 mL (0 mLs Intravenous Stopped 11/13/17 2157)    And  sodium chloride 0.9 % bolus 1,000 mL (0 mLs Intravenous Stopped 11/13/17 2158)  acetaminophen (TYLENOL) tablet 1,000 mg (1,000 mg Oral Given 11/13/17 2141)  cefTRIAXone (ROCEPHIN) 1 g in sodium chloride 0.9 % 100 mL IVPB (0 g Intravenous Stopped 11/13/17 2305)  azithromycin (ZITHROMAX) 500 mg in sodium chloride 0.9 % 250 mL IVPB (0 mg Intravenous Stopped 11/13/17 2335)  aspirin chewable tablet 324 mg (324 mg Oral Given 11/13/17 2327)     Initial Impression / Assessment and Plan / ED Course  I have reviewed the triage vital signs and the nursing notes.  Pertinent labs & imaging results that were available during my care of the patient were reviewed by me and considered in my medical decision making (see chart for details).    Pt given rocephin and zithromax for possible pna due to the fever and the hypoxia.  Pt is feeling better after IVFs and oxygen.  His EKG is ok, but troponin is slightly elevated, so pt given asa.   Pt d/w hospitalist for admission.  Final Clinical Impressions(s) / ED Diagnoses   Final diagnoses:  Fever, unspecified fever cause  Hypoxia  Acute bronchitis, unspecified organism  Elevated troponin  Anemia, unspecified type    ED Discharge Orders    None       Isla Pence, MD 11/14/17 [phone]

## 2017-11-13 NOTE — ED Notes (Signed)
This nurse returned call from pts wife at [phone].  Update given.

## 2017-11-14 ENCOUNTER — Other Ambulatory Visit: Payer: Self-pay

## 2017-11-14 ENCOUNTER — Inpatient Hospital Stay (HOSPITAL_COMMUNITY): Payer: Medicare Other

## 2017-11-14 DIAGNOSIS — Z515 Encounter for palliative care: Secondary | ICD-10-CM | POA: Diagnosis not present

## 2017-11-14 DIAGNOSIS — I10 Essential (primary) hypertension: Secondary | ICD-10-CM

## 2017-11-14 DIAGNOSIS — Z79890 Hormone replacement therapy (postmenopausal): Secondary | ICD-10-CM | POA: Diagnosis not present

## 2017-11-14 DIAGNOSIS — E785 Hyperlipidemia, unspecified: Secondary | ICD-10-CM | POA: Diagnosis not present

## 2017-11-14 DIAGNOSIS — C674 Malignant neoplasm of posterior wall of bladder: Secondary | ICD-10-CM | POA: Diagnosis not present

## 2017-11-14 DIAGNOSIS — I700 Atherosclerosis of aorta: Secondary | ICD-10-CM | POA: Diagnosis not present

## 2017-11-14 DIAGNOSIS — I2510 Atherosclerotic heart disease of native coronary artery without angina pectoris: Secondary | ICD-10-CM | POA: Diagnosis not present

## 2017-11-14 DIAGNOSIS — R7989 Other specified abnormal findings of blood chemistry: Secondary | ICD-10-CM

## 2017-11-14 DIAGNOSIS — I351 Nonrheumatic aortic (valve) insufficiency: Secondary | ICD-10-CM | POA: Diagnosis not present

## 2017-11-14 DIAGNOSIS — D649 Anemia, unspecified: Secondary | ICD-10-CM | POA: Diagnosis present

## 2017-11-14 DIAGNOSIS — N179 Acute kidney failure, unspecified: Secondary | ICD-10-CM

## 2017-11-14 DIAGNOSIS — Z79899 Other long term (current) drug therapy: Secondary | ICD-10-CM | POA: Diagnosis not present

## 2017-11-14 DIAGNOSIS — J9601 Acute respiratory failure with hypoxia: Secondary | ICD-10-CM | POA: Diagnosis present

## 2017-11-14 DIAGNOSIS — R748 Abnormal levels of other serum enzymes: Secondary | ICD-10-CM | POA: Diagnosis not present

## 2017-11-14 DIAGNOSIS — R778 Other specified abnormalities of plasma proteins: Secondary | ICD-10-CM | POA: Insufficient documentation

## 2017-11-14 DIAGNOSIS — I160 Hypertensive urgency: Secondary | ICD-10-CM | POA: Diagnosis present

## 2017-11-14 DIAGNOSIS — R0989 Other specified symptoms and signs involving the circulatory and respiratory systems: Secondary | ICD-10-CM | POA: Diagnosis not present

## 2017-11-14 DIAGNOSIS — F0390 Unspecified dementia without behavioral disturbance: Secondary | ICD-10-CM | POA: Diagnosis present

## 2017-11-14 DIAGNOSIS — J9811 Atelectasis: Secondary | ICD-10-CM | POA: Diagnosis not present

## 2017-11-14 DIAGNOSIS — Z7982 Long term (current) use of aspirin: Secondary | ICD-10-CM | POA: Diagnosis not present

## 2017-11-14 DIAGNOSIS — B952 Enterococcus as the cause of diseases classified elsewhere: Secondary | ICD-10-CM | POA: Diagnosis present

## 2017-11-14 DIAGNOSIS — Z9849 Cataract extraction status, unspecified eye: Secondary | ICD-10-CM | POA: Diagnosis not present

## 2017-11-14 DIAGNOSIS — R7881 Bacteremia: Secondary | ICD-10-CM | POA: Diagnosis not present

## 2017-11-14 DIAGNOSIS — J209 Acute bronchitis, unspecified: Secondary | ICD-10-CM | POA: Diagnosis not present

## 2017-11-14 DIAGNOSIS — E039 Hypothyroidism, unspecified: Secondary | ICD-10-CM | POA: Diagnosis present

## 2017-11-14 DIAGNOSIS — I350 Nonrheumatic aortic (valve) stenosis: Secondary | ICD-10-CM | POA: Diagnosis not present

## 2017-11-14 DIAGNOSIS — I509 Heart failure, unspecified: Secondary | ICD-10-CM | POA: Diagnosis present

## 2017-11-14 DIAGNOSIS — E876 Hypokalemia: Secondary | ICD-10-CM | POA: Diagnosis present

## 2017-11-14 DIAGNOSIS — R0602 Shortness of breath: Secondary | ICD-10-CM | POA: Diagnosis not present

## 2017-11-14 DIAGNOSIS — J189 Pneumonia, unspecified organism: Principal | ICD-10-CM

## 2017-11-14 DIAGNOSIS — H9190 Unspecified hearing loss, unspecified ear: Secondary | ICD-10-CM | POA: Diagnosis present

## 2017-11-14 DIAGNOSIS — Z8546 Personal history of malignant neoplasm of prostate: Secondary | ICD-10-CM | POA: Diagnosis not present

## 2017-11-14 DIAGNOSIS — F05 Delirium due to known physiological condition: Secondary | ICD-10-CM | POA: Diagnosis present

## 2017-11-14 DIAGNOSIS — G8911 Acute pain due to trauma: Secondary | ICD-10-CM | POA: Diagnosis not present

## 2017-11-14 DIAGNOSIS — R062 Wheezing: Secondary | ICD-10-CM | POA: Diagnosis not present

## 2017-11-14 DIAGNOSIS — R509 Fever, unspecified: Secondary | ICD-10-CM | POA: Diagnosis not present

## 2017-11-14 DIAGNOSIS — R011 Cardiac murmur, unspecified: Secondary | ICD-10-CM | POA: Diagnosis not present

## 2017-11-14 DIAGNOSIS — W1789XA Other fall from one level to another, initial encounter: Secondary | ICD-10-CM | POA: Diagnosis present

## 2017-11-14 DIAGNOSIS — I130 Hypertensive heart and chronic kidney disease with heart failure and stage 1 through stage 4 chronic kidney disease, or unspecified chronic kidney disease: Secondary | ICD-10-CM | POA: Diagnosis present

## 2017-11-14 LAB — URINALYSIS, ROUTINE W REFLEX MICROSCOPIC
BILIRUBIN URINE: NEGATIVE
Glucose, UA: NEGATIVE mg/dL
Ketones, ur: NEGATIVE mg/dL
Nitrite: NEGATIVE
PH: 5 (ref 5.0–8.0)
Protein, ur: 100 mg/dL — AB
SPECIFIC GRAVITY, URINE: 1.018 (ref 1.005–1.030)

## 2017-11-14 LAB — BLOOD CULTURE ID PANEL (REFLEXED)
Acinetobacter baumannii: NOT DETECTED
CANDIDA GLABRATA: NOT DETECTED
CANDIDA KRUSEI: NOT DETECTED
Candida albicans: NOT DETECTED
Candida parapsilosis: NOT DETECTED
Candida tropicalis: NOT DETECTED
ENTEROBACTER CLOACAE COMPLEX: NOT DETECTED
ENTEROBACTERIACEAE SPECIES: NOT DETECTED
ENTEROCOCCUS SPECIES: DETECTED — AB
ESCHERICHIA COLI: NOT DETECTED
Haemophilus influenzae: NOT DETECTED
Klebsiella oxytoca: NOT DETECTED
Klebsiella pneumoniae: NOT DETECTED
Listeria monocytogenes: NOT DETECTED
Neisseria meningitidis: NOT DETECTED
PSEUDOMONAS AERUGINOSA: NOT DETECTED
Proteus species: NOT DETECTED
SERRATIA MARCESCENS: NOT DETECTED
STAPHYLOCOCCUS SPECIES: NOT DETECTED
Staphylococcus aureus (BCID): NOT DETECTED
Streptococcus agalactiae: NOT DETECTED
Streptococcus pneumoniae: NOT DETECTED
Streptococcus pyogenes: NOT DETECTED
Streptococcus species: NOT DETECTED
Vancomycin resistance: NOT DETECTED

## 2017-11-14 LAB — CBC
HCT: 28.6 % — ABNORMAL LOW (ref 39.0–52.0)
HCT: 35.7 % — ABNORMAL LOW (ref 39.0–52.0)
Hemoglobin: 8.8 g/dL — ABNORMAL LOW (ref 13.0–17.0)
Hemoglobin: 10.8 g/dL — ABNORMAL LOW (ref 13.0–17.0)
MCH: 26.8 pg (ref 26.0–34.0)
MCH: 27.1 pg (ref 26.0–34.0)
MCHC: 30.8 g/dL (ref 30.0–36.0)
MCHC: 30.3 g/dL (ref 30.0–36.0)
MCV: 87.2 fL (ref 78.0–100.0)
MCV: 89.5 fL (ref 78.0–100.0)
Platelets: 133 10*3/uL — ABNORMAL LOW (ref 150–400)
Platelets: 175 10*3/uL (ref 150–400)
RBC: 3.28 MIL/uL — ABNORMAL LOW (ref 4.22–5.81)
RBC: 3.99 MIL/uL — ABNORMAL LOW (ref 4.22–5.81)
RDW: 15.1 % (ref 11.5–15.5)
RDW: 15.6 % — AB (ref 11.5–15.5)
WBC: 7.8 10*3/uL (ref 4.0–10.5)
WBC: 11.9 10*3/uL — AB (ref 4.0–10.5)

## 2017-11-14 LAB — BASIC METABOLIC PANEL
ANION GAP: 12 (ref 5–15)
ANION GAP: 11 (ref 5–15)
BUN: 27 mg/dL — ABNORMAL HIGH (ref 6–20)
BUN: 32 mg/dL — AB (ref 6–20)
CALCIUM: 7.7 mg/dL — AB (ref 8.9–10.3)
CO2: 21 mmol/L — ABNORMAL LOW (ref 22–32)
CO2: 20 mmol/L — ABNORMAL LOW (ref 22–32)
CREATININE: 1.75 mg/dL — AB (ref 0.61–1.24)
Calcium: 8 mg/dL — ABNORMAL LOW (ref 8.9–10.3)
Chloride: 110 mmol/L (ref 101–111)
Chloride: 109 mmol/L (ref 101–111)
Creatinine, Ser: 1.69 mg/dL — ABNORMAL HIGH (ref 0.61–1.24)
GFR calc Af Amer: 39 mL/min — ABNORMAL LOW (ref >60)
GFR, EST AFRICAN AMERICAN: 37 mL/min — AB (ref >60)
GFR, EST NON AFRICAN AMERICAN: 32 mL/min — AB (ref >60)
GFR, EST NON AFRICAN AMERICAN: 34 mL/min — AB (ref >60)
GLUCOSE: 227 mg/dL — AB (ref 65–99)
Glucose, Bld: 136 mg/dL — ABNORMAL HIGH (ref 65–99)
Potassium: 3.9 mmol/L (ref 3.5–5.1)
Potassium: 4.7 mmol/L (ref 3.5–5.1)
SODIUM: 143 mmol/L (ref 135–145)
Sodium: 140 mmol/L (ref 135–145)

## 2017-11-14 LAB — LIPID PANEL
Cholesterol: 94 mg/dL (ref 0–200)
HDL: 43 mg/dL (ref >40)
LDL Cholesterol: 42 mg/dL (ref 0–99)
Total CHOL/HDL Ratio: 2.2 RATIO
Triglycerides: 43 mg/dL (ref <150)
VLDL: 9 mg/dL (ref 0–40)

## 2017-11-14 LAB — BRAIN NATRIURETIC PEPTIDE: B Natriuretic Peptide: 533.9 pg/mL — ABNORMAL HIGH (ref 0.0–100.0)

## 2017-11-14 LAB — I-STAT CG4 LACTIC ACID, ED: Lactic Acid, Venous: 0.54 mmol/L (ref 0.5–1.9)

## 2017-11-14 LAB — TROPONIN I
TROPONIN I: 0.27 ng/mL — AB (ref <0.03)
TROPONIN I: 0.2 ng/mL — AB (ref <0.03)

## 2017-11-14 LAB — INFLUENZA PANEL BY PCR (TYPE A & B)
INFLAPCR: NEGATIVE
INFLBPCR: NEGATIVE

## 2017-11-14 LAB — MRSA PCR SCREENING: MRSA BY PCR: NEGATIVE

## 2017-11-14 LAB — LACTIC ACID, PLASMA: Lactic Acid, Venous: 1.3 mmol/L (ref 0.5–1.9)

## 2017-11-14 MED ORDER — SIMVASTATIN 20 MG PO TABS
20.0000 mg | ORAL_TABLET | Freq: Every day | ORAL | Status: DC
  Administered 2017-11-14 – 2017-11-17 (×4): 20 mg via ORAL
  Filled 2017-11-14 (×5): qty 1

## 2017-11-14 MED ORDER — VANCOMYCIN HCL IN DEXTROSE 750-5 MG/150ML-% IV SOLN
750.0000 mg | INTRAVENOUS | Status: DC
  Administered 2017-11-14: 750 mg via INTRAVENOUS
  Filled 2017-11-14 (×2): qty 150

## 2017-11-14 MED ORDER — FUROSEMIDE 10 MG/ML IJ SOLN
40.0000 mg | Freq: Two times a day (BID) | INTRAMUSCULAR | Status: AC
  Administered 2017-11-14 – 2017-11-15 (×2): 40 mg via INTRAVENOUS
  Filled 2017-11-14 (×2): qty 4

## 2017-11-14 MED ORDER — FUROSEMIDE 10 MG/ML IJ SOLN
60.0000 mg | Freq: Once | INTRAMUSCULAR | Status: AC
  Administered 2017-11-14: 60 mg via INTRAVENOUS
  Filled 2017-11-14: qty 6

## 2017-11-14 MED ORDER — ASPIRIN EC 81 MG PO TBEC
81.0000 mg | DELAYED_RELEASE_TABLET | Freq: Every day | ORAL | Status: DC
  Filled 2017-11-14: qty 1

## 2017-11-14 MED ORDER — MELATONIN 3 MG PO TABS
3.0000 mg | ORAL_TABLET | Freq: Every evening | ORAL | Status: DC | PRN
  Administered 2017-11-14 – 2017-11-17 (×3): 3 mg via ORAL
  Filled 2017-11-14 (×3): qty 1

## 2017-11-14 MED ORDER — CARVEDILOL 3.125 MG PO TABS
3.1250 mg | ORAL_TABLET | Freq: Two times a day (BID) | ORAL | Status: DC
  Administered 2017-11-14 – 2017-11-18 (×9): 3.125 mg via ORAL
  Filled 2017-11-14 (×10): qty 1

## 2017-11-14 MED ORDER — ASPIRIN EC 81 MG PO TBEC
81.0000 mg | DELAYED_RELEASE_TABLET | Freq: Every day | ORAL | Status: DC
  Administered 2017-11-14 – 2017-11-18 (×5): 81 mg via ORAL
  Filled 2017-11-14 (×4): qty 1

## 2017-11-14 MED ORDER — SODIUM CHLORIDE 0.9 % IV SOLN: 1.0000 g | INTRAVENOUS | Status: DC

## 2017-11-14 MED ORDER — FAMOTIDINE 20 MG PO TABS
20.0000 mg | ORAL_TABLET | Freq: Two times a day (BID) | ORAL | Status: DC
  Administered 2017-11-14 – 2017-11-15 (×3): 20 mg via ORAL
  Filled 2017-11-14 (×3): qty 1

## 2017-11-14 MED ORDER — HYDROCODONE-ACETAMINOPHEN 5-325 MG PO TABS: 1.0000 | ORAL_TABLET | ORAL | Status: DC | PRN

## 2017-11-14 MED ORDER — AMLODIPINE BESYLATE 5 MG PO TABS
5.0000 mg | ORAL_TABLET | Freq: Every day | ORAL | Status: DC
  Administered 2017-11-15: 5 mg via ORAL
  Filled 2017-11-14: qty 1

## 2017-11-14 MED ORDER — IPRATROPIUM-ALBUTEROL 0.5-2.5 (3) MG/3ML IN SOLN
3.0000 mL | RESPIRATORY_TRACT | Status: DC | PRN
  Administered 2017-11-14: 3 mL via RESPIRATORY_TRACT
  Filled 2017-11-14: qty 3

## 2017-11-14 MED ORDER — IPRATROPIUM-ALBUTEROL 0.5-2.5 (3) MG/3ML IN SOLN
3.0000 mL | Freq: Four times a day (QID) | RESPIRATORY_TRACT | Status: DC
  Administered 2017-11-14: 3 mL via RESPIRATORY_TRACT

## 2017-11-14 MED ORDER — MORPHINE SULFATE (PF) 4 MG/ML IV SOLN: 1.0000 mg | INTRAVENOUS | Status: DC | PRN

## 2017-11-14 MED ORDER — METHYLPREDNISOLONE SODIUM SUCC 125 MG IJ SOLR
60.0000 mg | Freq: Two times a day (BID) | INTRAMUSCULAR | Status: AC
  Administered 2017-11-14 – 2017-11-15 (×3): 60 mg via INTRAVENOUS
  Filled 2017-11-14 (×3): qty 2

## 2017-11-14 MED ORDER — HEPARIN SODIUM (PORCINE) 5000 UNIT/ML IJ SOLN
5000.0000 [IU] | Freq: Three times a day (TID) | INTRAMUSCULAR | Status: DC
  Administered 2017-11-14 – 2017-11-18 (×11): 5000 [IU] via SUBCUTANEOUS
  Filled 2017-11-14 (×11): qty 1

## 2017-11-14 MED ORDER — SODIUM CHLORIDE 0.9 % IV SOLN
2.0000 g | Freq: Three times a day (TID) | INTRAVENOUS | Status: DC
  Administered 2017-11-14 (×2): 2 g via INTRAVENOUS
  Filled 2017-11-14 (×6): qty 2000

## 2017-11-14 MED ORDER — SODIUM CHLORIDE 0.9 % IV SOLN
INTRAVENOUS | Status: DC
  Administered 2017-11-14: 05:00:00 via INTRAVENOUS

## 2017-11-14 MED ORDER — LEVOTHYROXINE SODIUM 50 MCG PO TABS
50.0000 ug | ORAL_TABLET | Freq: Every day | ORAL | Status: DC
  Administered 2017-11-14 – 2017-11-18 (×5): 50 ug via ORAL
  Filled 2017-11-14 (×5): qty 1

## 2017-11-14 MED ORDER — DOXYCYCLINE HYCLATE 100 MG IV SOLR
100.0000 mg | Freq: Two times a day (BID) | INTRAVENOUS | Status: DC
  Administered 2017-11-14: 100 mg via INTRAVENOUS
  Filled 2017-11-14 (×2): qty 100

## 2017-11-14 MED ORDER — HYDRALAZINE HCL 20 MG/ML IJ SOLN
10.0000 mg | INTRAMUSCULAR | Status: DC | PRN
  Administered 2017-11-14: 10 mg via INTRAVENOUS
  Filled 2017-11-14: qty 1

## 2017-11-14 MED ORDER — NITROGLYCERIN 0.4 MG SL SUBL: 0.4000 mg | SUBLINGUAL_TABLET | SUBLINGUAL | Status: DC | PRN

## 2017-11-14 MED ORDER — IPRATROPIUM-ALBUTEROL 0.5-2.5 (3) MG/3ML IN SOLN: 3.0000 mL | Freq: Four times a day (QID) | RESPIRATORY_TRACT | Status: DC | PRN

## 2017-11-14 NOTE — ED Notes (Signed)
Admitting paged to RN Hayley per her request 

## 2017-11-14 NOTE — Progress Notes (Signed)
Patient was reevaluated at bedside in ER.  Patient was breathing fast.  He however was denied chest pain or shortness of breath.  His wife at bedside.  Patient received IV hydralazine with improvement of systolic blood pressure 132G. -Added Norvasc 5 mg, continue hydralazine as needed.  Avoid hypotension.  Patient received a dose of IV Lasix earlier.  Repeat chest x-ray.  Which was reviewed personally.  No sign of infiltrates.  May have minimal basal atelectasis. -Stat CBC, BMP, BNP and lactic acid level obtained. -Continue IV antibiotics for bacteremia.  Waiting for echo  I reviewed bacteremia, current unstable clinical condition and goals of care with the patient's wife at bedside.  She said she has a son at [place] and she would like to discuss with him before making any decision.  Palliative care requested.  For now continue current management.  Daily assessment for need of diuretics.  On steroid.  May need BiPAP if no improvement in breathing.  I will change bed order to stepdown unit.

## 2017-11-14 NOTE — ED Notes (Signed)
Admitting pagged per RN

## 2017-11-14 NOTE — Progress Notes (Signed)
Patient was seen and examined at bedside in ER.  Admitted after midnight.  Please see H&P for detail. 84 year old male who presented for a fall, weakness.  In the ER patient was found to have hypoxia requiring oxygen, febrile to 1 1.7.  Initially admitted for possible committee acquired pneumonia.  Blood culture positive for enterococcus.  Changed antibiotics to vancomycin and ampicillin. Patient with wheezing and hypoxia.  Ordered DuoNeb nebulization, Solu-Medrol twice a day, discontinue IV fluid.  Check BNP.  Continue bronchodilators and breathing treatment. Blood pressure elevated.  Hydralazine as needed.  Continue to monitor.  On Coreg. Repeat lab in the morning. DVT prophylaxis: On heparin subcu. Discussed with the ER nurse.

## 2017-11-14 NOTE — ED Notes (Signed)
Patient oxygen saturation dropped to 70% on 2L o2. Patient place on non re breather. 100% now. BP 208/101. Admitting aware.

## 2017-11-14 NOTE — Consult Note (Signed)
[facility]  Total days of antibiotics 2        Day 1 amp/sub               Reason for Consult:enterococcal bacteremia   Referring Physician: bhandari  Active Problems:   Hypothyroidism   Dyslipidemia   Essential hypertension   Coronary atherosclerosis   PROSTATE CANCER, HX OF   Fall   Pneumonia    HPI: Raymond Swanson is a 84 y.o. male with history of CAD, HTN, hx of Prostate CA, hematuria found to have papillary bladder tumer s/p cystoscopy, TURBT and R JJ stent placement on 2/20 admitted for weakness, ground level fall, but also complained of shortness of breath and cough over the last few days. He was started on CAP coverage. His labs show leukocytosis with mild bandemia of 5%, BNP elevated at 533;UA shows TMTC WBC,  Infectious work up shows that he has enterococcal bacteremia. abtx changed to amp/sub this morning. Patient is a man of few words,not a good historian. Smiles occasionally when asked questions. He received hydralazine for his elevated BP SBP >180  Past Medical History:  Diagnosis Date  . ACUT GASTR ULCER W/HEMORR W/O MENTION OBST 09/10/2009  . ANEMIA 10/08/2009  . Cancer ([place])   . CORONARY ARTERY DISEASE 01/26/2007  . Fall 2016   fall from deck while cleaning gutters; sustained subdemral hematoma ; doing ok now   . History of kidney stones   . HOH (hard of hearing)   . HYPERLIPIDEMIA 01/26/2007  . HYPERTENSION 01/26/2007  . HYPOTHYROIDISM 10/18/2007  . MELENA 08/28/2009  . PROSTATE CANCER, HX OF 01/26/2007  . SKIN CANCER, HX OF 01/26/2007    Allergies: No Known Allergies   MEDICATIONS: . aspirin EC  81 mg Oral Daily  . carvedilol  3.125 mg Oral BID WC  . famotidine  20 mg Oral BID  . heparin  5,000 Units Subcutaneous Q8H  . levothyroxine  50 mcg Oral QAC breakfast  . methylPREDNISolone (SOLU-MEDROL) injection  60 mg Intravenous Q12H  . simvastatin  20 mg Oral q1800    Social History   Tobacco Use  . Smoking status: Former Smoker    Packs/day: 1.00    Years: 20.00    Pack years: 20.00    Types: Cigarettes    Last attempt to quit: 10/28/1948    Years since quitting: 69.0  . Smokeless tobacco: Never Used  Substance Use Topics  . Alcohol use: Yes    Comment: 4 oz. wine daily  . Drug use: No    Family History  Problem Relation Age of Onset  . Lung cancer Brother 28  . GI problems Sister     Review of Systems - Unable to obtain due to patient encephalopathy from infection  OBJECTIVE: Temp:  [98.2 F (36.8 C)-101.7 F (38.7 C)] 98.4 F (36.9 C) (03/24 1201) Pulse Rate:  [76-127] 114 (03/24 1240) Resp:  [19-36] 32 (03/24 1240) BP: (119-208)/(60-104) 208/101 (03/24 1240) SpO2:  [91 %-99 %] 99 % (03/24 1240) Physical Exam  Constitutional: He is oriented to person, place. He appears frail, stated age.No distress.  HENT:  Mouth/Throat: Oropharynx is dry. No oropharyngeal exudate.  Cardiovascular: Normal rate, regular rhythm and normal heart sounds. Exam reveals no gallop and no friction rub.  No murmur heard.  Pulmonary/Chest: tachypnea No respiratory distress. Air movement throughout but mild exp wheezing on left ant lung fields Abdominal: Soft. Bowel sounds are normal. He exhibits no distension. There  is no tenderness.  Lymphadenopathy:  He has no cervical adenopathy.  Neurological: He is alert and oriented to person, place,only Skin: Skin is warm and dry. No rash noted. No erythema.     LABS: Results for orders placed or performed during the hospital encounter of 11/13/17 (from the past 48 hour(s))  Comprehensive metabolic panel     Status: Abnormal   Collection Time: 11/13/17  9:19 PM  Result Value Ref Range   Sodium 140 135 - 145 mmol/L   Potassium 4.0 3.5 - 5.1 mmol/L   Chloride 107 101 - 111 mmol/L   CO2 23 22 - 32 mmol/L   Glucose, Bld 178 (H) 65 - 99 mg/dL   BUN 28 (H) 6 - 20 mg/dL   Creatinine, Ser 1.78 (H) 0.61 - 1.24 mg/dL   Calcium 8.5 (L) 8.9 - 10.3 mg/dL   Total Protein 6.4 (L) 6.5 -  8.1 g/dL   Albumin 3.2 (L) 3.5 - 5.0 g/dL   AST 39 15 - 41 U/L   ALT 17 17 - 63 U/L   Alkaline Phosphatase 80 38 - 126 U/L   Total Bilirubin 0.6 0.3 - 1.2 mg/dL   GFR calc non Af Amer 32 (L) >60 mL/min   GFR calc Af Amer 37 (L) >60 mL/min    Comment: (NOTE) The eGFR has been calculated using the CKD EPI equation. This calculation has not been validated in all clinical situations. eGFR's persistently <60 mL/min signify possible Chronic Kidney Disease.    Anion gap 10 5 - 15    Comment: Performed at [facility] [address] [place], [place] [address]  CBC WITH DIFFERENTIAL     Status: Abnormal   Collection Time: 11/13/17  9:19 PM  Result Value Ref Range   WBC 9.3 4.0 - 10.5 K/uL   RBC 3.71 (L) 4.22 - 5.81 MIL/uL   Hemoglobin 9.7 (L) 13.0 - 17.0 g/dL   HCT 32.0 (L) 39.0 - 52.0 %   MCV 86.3 78.0 - 100.0 fL   MCH 26.1 26.0 - 34.0 pg   MCHC 30.3 30.0 - 36.0 g/dL   RDW 14.7 11.5 - 15.5 %   Platelets 167 150 - 400 K/uL   Neutrophils Relative % 89 %   Lymphocytes Relative 7 %   Monocytes Relative 4 %   Eosinophils Relative 0 %   Basophils Relative 0 %   Neutro Abs 8.2 (H) 1.7 - 7.7 K/uL   Lymphs Abs 0.7 0.7 - 4.0 K/uL   Monocytes Absolute 0.4 0.1 - 1.0 K/uL   Eosinophils Absolute 0.0 0.0 - 0.7 K/uL   Basophils Absolute 0.0 0.0 - 0.1 K/uL   RBC Morphology POLYCHROMASIA PRESENT    WBC Morphology MILD LEFT SHIFT (1-5% METAS, OCC MYELO, OCC BANDS)     Comment: Performed at [facility], [address], [place], [place] [address]  Troponin I     Status: Abnormal   Collection Time: 11/13/17  9:19 PM  Result Value Ref Range   Troponin I 0.21 (HH) <0.03 ng/mL    Comment: CRITICAL RESULT CALLED TO, READ BACK BY AND VERIFIED WITH: C.PRICE,RN 2258 11/13/17 [email or web address] Performed at [facility], [place] [address] [place], [place] [address]   Blood Culture (routine x 2)     Status: None (Preliminary result)   Collection Time: 11/13/17  9:20 PM  Result Value Ref Range    Specimen Description BLOOD RIGHT FOREARM    Special Requests  BOTTLES DRAWN AEROBIC AND ANAEROBIC Blood Culture adequate volume   Culture  Setup Time      GRAM POSITIVE COCCI IN PAIRS IN CHAINS IN BOTH AEROBIC AND ANAEROBIC BOTTLES CRITICAL RESULT CALLED TO, READ BACK BY AND VERIFIED WITH: Ailene Rud AT 1144 11/14/17 BY L BENFIELD Performed at [facility], [place] [address], [place], [place] [address]    Culture GRAM POSITIVE COCCI    Report Status PENDING   Blood Culture ID Panel (Reflexed)     Status: Abnormal   Collection Time: 11/13/17  9:20 PM  Result Value Ref Range   Enterococcus species DETECTED (A) NOT DETECTED    Comment: CRITICAL RESULT CALLED TO, READ BACK BY AND VERIFIED WITH: M MACCIA,PHARMD AT 1144 11/14/17 BY L BENFIELD    Vancomycin resistance NOT DETECTED NOT DETECTED   Listeria monocytogenes NOT DETECTED NOT DETECTED   Staphylococcus species NOT DETECTED NOT DETECTED   Staphylococcus aureus NOT DETECTED NOT DETECTED   Streptococcus species NOT DETECTED NOT DETECTED   Streptococcus agalactiae NOT DETECTED NOT DETECTED   Streptococcus pneumoniae NOT DETECTED NOT DETECTED   Streptococcus pyogenes NOT DETECTED NOT DETECTED   Acinetobacter baumannii NOT DETECTED NOT DETECTED   Enterobacteriaceae species NOT DETECTED NOT DETECTED   Enterobacter cloacae complex NOT DETECTED NOT DETECTED   Escherichia coli NOT DETECTED NOT DETECTED   Klebsiella oxytoca NOT DETECTED NOT DETECTED   Klebsiella pneumoniae NOT DETECTED NOT DETECTED   Proteus species NOT DETECTED NOT DETECTED   Serratia marcescens NOT DETECTED NOT DETECTED   Haemophilus influenzae NOT DETECTED NOT DETECTED   Neisseria meningitidis NOT DETECTED NOT DETECTED   Pseudomonas aeruginosa NOT DETECTED NOT DETECTED   Candida albicans NOT DETECTED NOT DETECTED   Candida glabrata NOT DETECTED NOT DETECTED   Candida krusei NOT DETECTED NOT DETECTED   Candida parapsilosis NOT DETECTED NOT DETECTED    Candida tropicalis NOT DETECTED NOT DETECTED    Comment: Performed at [facility], [address], [place], [place] [address]  Blood Culture (routine x 2)     Status: None (Preliminary result)   Collection Time: 11/13/17  9:29 PM  Result Value Ref Range   Specimen Description BLOOD BLOOD LEFT FOREARM    Special Requests      BOTTLES DRAWN AEROBIC AND ANAEROBIC Blood Culture adequate volume   Culture  Setup Time      GRAM POSITIVE COCCI IN PAIRS IN CHAINS IN BOTH AEROBIC AND ANAEROBIC BOTTLES CRITICAL RESULT CALLED TO, READ BACK BY AND VERIFIED WITH: Ailene Rud AT 3846 11/14/17 BY L BENFIELD Performed at [facility], [place] [address], [place], [place] [address]    Culture GRAM POSITIVE COCCI    Report Status PENDING   I-Stat CG4 Lactic Acid, ED  (not at  [facility])     Status: None   Collection Time: 11/13/17  9:41 PM  Result Value Ref Range   Lactic Acid, Venous 1.09 0.5 - 1.9 mmol/L  Influenza panel by PCR (type A & B)     Status: None   Collection Time: 11/13/17 10:51 PM  Result Value Ref Range   Influenza A By PCR NEGATIVE NEGATIVE   Influenza B By PCR NEGATIVE NEGATIVE    Comment: (NOTE) The Xpert Xpress Flu assay is intended as an aid in the diagnosis of  influenza and should not be used as a sole basis for treatment.  This  assay is FDA approved for nasopharyngeal swab specimens only. Nasal  washings and aspirates  are unacceptable for Xpert Xpress Flu testing. Performed at [facility], [place] [address], [place], [place] [address]   I-Stat CG4 Lactic Acid, ED  (not at  [facility])     Status: None   Collection Time: 11/14/17 12:55 AM  Result Value Ref Range   Lactic Acid, Venous 0.54 0.5 - 1.9 mmol/L  Urinalysis, Routine w reflex microscopic     Status: Abnormal   Collection Time: 11/14/17 12:57 AM  Result Value Ref Range   Color, Urine YELLOW YELLOW   APPearance HAZY (A) CLEAR   Specific Gravity, Urine 1.018 1.005 - 1.030   pH 5.0 5.0 - 8.0   Glucose, UA  NEGATIVE NEGATIVE mg/dL   Hgb urine dipstick MODERATE (A) NEGATIVE   Bilirubin Urine NEGATIVE NEGATIVE   Ketones, ur NEGATIVE NEGATIVE mg/dL   Protein, ur 100 (A) NEGATIVE mg/dL   Nitrite NEGATIVE NEGATIVE   Leukocytes, UA SMALL (A) NEGATIVE   RBC / HPF 6-30 0 - 5 RBC/hpf   WBC, UA TOO NUMEROUS TO COUNT 0 - 5 WBC/hpf   Bacteria, UA RARE (A) NONE SEEN   Squamous Epithelial / LPF 0-5 (A) NONE SEEN   Mucus PRESENT    Granular Casts, UA PRESENT     Comment: Performed at [facility], [address] [place], [place] [address]  CBC     Status: Abnormal   Collection Time: 11/14/17  4:18 AM  Result Value Ref Range   WBC 7.8 4.0 - 10.5 K/uL   RBC 3.28 (L) 4.22 - 5.81 MIL/uL   Hemoglobin 8.8 (L) 13.0 - 17.0 g/dL   HCT 28.6 (L) 39.0 - 52.0 %   MCV 87.2 78.0 - 100.0 fL   MCH 26.8 26.0 - 34.0 pg   MCHC 30.8 30.0 - 36.0 g/dL   RDW 15.1 11.5 - 15.5 %   Platelets 133 (L) 150 - 400 K/uL    Comment: Performed at [facility], [place] [address], [place], [place] [address]  Basic metabolic panel     Status: Abnormal   Collection Time: 11/14/17  4:18 AM  Result Value Ref Range   Sodium 143 135 - 145 mmol/L   Potassium 3.9 3.5 - 5.1 mmol/L   Chloride 110 101 - 111 mmol/L   CO2 21 (L) 22 - 32 mmol/L   Glucose, Bld 136 (H) 65 - 99 mg/dL   BUN 27 (H) 6 - 20 mg/dL   Creatinine, Ser 1.75 (H) 0.61 - 1.24 mg/dL   Calcium 7.7 (L) 8.9 - 10.3 mg/dL   GFR calc non Af Amer 32 (L) >60 mL/min   GFR calc Af Amer 37 (L) >60 mL/min    Comment: (NOTE) The eGFR has been calculated using the CKD EPI equation. This calculation has not been validated in all clinical situations. eGFR's persistently <60 mL/min signify possible Chronic Kidney Disease.    Anion gap 12 5 - 15    Comment: Performed at [facility] [address] [place], [place] [address]  Troponin I     Status: Abnormal   Collection Time: 11/14/17  4:18 AM  Result Value Ref Range   Troponin I 0.27 (HH) <0.03 ng/mL    Comment:  CRITICAL VALUE NOTED.  VALUE IS CONSISTENT WITH PREVIOUSLY REPORTED AND CALLED VALUE. Performed at [facility], [place] [address], [place], [place] [address]   Lipid panel     Status: None   Collection Time: 11/14/17  4:18 AM  Result  Value Ref Range   Cholesterol 94 0 - 200 mg/dL   Triglycerides 43 <150 mg/dL   HDL 43 >40 mg/dL   Total CHOL/HDL Ratio 2.2 RATIO   VLDL 9 0 - 40 mg/dL   LDL Cholesterol 42 0 - 99 mg/dL    Comment:        Total Cholesterol/HDL:CHD Risk Coronary Heart Disease Risk Table                     Men   Women  1/2 Average Risk   3.4   3.3  Average Risk       5.0   4.4  2 X Average Risk   9.6   7.1  3 X Average Risk  23.4   11.0        Use the calculated Patient Ratio above and the CHD Risk Table to determine the patient's CHD Risk.        ATP III CLASSIFICATION (LDL):  <100     mg/dL   Optimal  100-129  mg/dL   Near or Above                    Optimal  130-159  mg/dL   Borderline  160-189  mg/dL   High  >190     mg/dL   Very High Performed at [facility] [address] [place], [place] [address]   Troponin I     Status: Abnormal   Collection Time: 11/14/17  9:35 AM  Result Value Ref Range   Troponin I 0.20 (HH) <0.03 ng/mL    Comment: CRITICAL VALUE NOTED.  VALUE IS CONSISTENT WITH PREVIOUSLY REPORTED AND CALLED VALUE. Performed at [facility], [place] [address], [place], [place] [address]     MICRO: 3/23 blood cx enterococcus by bcid IMAGING: Dg Chest Port 1 View  Result Date: 11/13/2017 CLINICAL DATA:  Patient fell and was too weak to get up.  Dyspnea. EXAM: PORTABLE CHEST 1 VIEW COMPARISON:  01/23/2016 FINDINGS: Heart size and mediastinal contours are stable with post CABG change and moderate aortic atherosclerosis. Bibasilar atelectasis is redemonstrated without acute pneumonic consolidation, effusion or pneumothorax. Chronic left-sided rib fractures involving the posterior left sixth and seventh ribs. IMPRESSION: Persistent  bibasilar atelectasis. Aortic atherosclerosis with borderline cardiomegaly. Chronic left-sided rib fractures. Electronically Signed   By: Ashley Royalty M.D.   On: 11/13/2017 21:38     Assessment/Plan:  84yo M with hx of CAD, hx of Prostate CA, and most recently bladder cancer admitted for malaise, weakness, ground level fall. Found to have enterococcal bacteremia, but some respiratory distress from mild CHF.  - continue with amp/sub for treatment of enterococcal bacteremia which would also give you pneumonia coverage - appears in mild respiratory distress and may need bipap, agree with plan to step-down. - for now plan to get TTE, depending on goals of care => will dictate further work up and length of therapy - repeat blood cx tomorrow to see if bacteremia is cleared.  Dr Megan Salon to see tomorrow.

## 2017-11-14 NOTE — H&P (Signed)
PCP:   Marletta Lor, MD   Chief Complaint:  Fall/weakness  HPI: This is a 84 year old male who states he is been getting progressively weaker for the past week or so. He found that he needed his skin or his walker to ambulate. Yesterday he dropped a bottle, reach rotated and fell. He was unable to get up. 911 was called and was brought to the ER. The ER the patient had a wet cough that has been present approximately 3-4 days. He denies any shortness of breath but he does have a very mild wheeze. His troponin was also elevated but the patient denies any chest pains. His creatinine was elevated above baseline. The patient has a recent history of prostate cancer, he states he is on medications for this which he completed 2 days ago. He states the medication affected this the taste buds and his by mouth intake has been very poor. He states is also made his mouth very dry. The patient doesn't normally use a cane or walker. In the ER his oxygen sats are a little bit low at 86%, he normally doesn't use oxygen. He was also febrile to 101.7. The ER physician initiated treatment for community-acquired pneumonia, despite a negative chest x-ray and influenza swab. We have been asked to admit for weakness and possible viral bronchitis. History provided by the patient to who is hard of hearing but alert and oriented. No family members present at bedside.  Review of Systems:  The patient denies anorexia, fever, weight loss,, vision loss, decreased hearing, hoarseness, chest pain, syncope, dyspnea on exertion, peripheral edema, balance deficits, hemoptysis, abdominal pain, melena, hematochezia, severe indigestion/heartburn, fall, cough, hematuria, incontinence, genital sores, muscle weakness, suspicious skin lesions, transient blindness, difficulty walking, depression, unusual weight change, abnormal bleeding, enlarged lymph nodes, angioedema, and breast masses.  Past Medical History: Past Medical History:   Diagnosis Date  . ACUT GASTR ULCER W/HEMORR W/O MENTION OBST 09/10/2009  . ANEMIA 10/08/2009  . Cancer ([place])   . CORONARY ARTERY DISEASE 01/26/2007  . Fall 2016   fall from deck while cleaning gutters; sustained subdemral hematoma ; doing ok now   . History of kidney stones   . HOH (hard of hearing)   . HYPERLIPIDEMIA 01/26/2007  . HYPERTENSION 01/26/2007  . HYPOTHYROIDISM 10/18/2007  . MELENA 08/28/2009  . PROSTATE CANCER, HX OF 01/26/2007  . SKIN CANCER, HX OF 01/26/2007   Past Surgical History:  Procedure Laterality Date  . CATARACT EXTRACTION    . CORONARY ARTERY BYPASS GRAFT     1991; reports at PAT appt 10-11-17: i went to my priamry doctor for a physical and had aroutine stress test that was bad, was sent for cath ( more than 1 but nsure how many or if stents were placed), he rprots " that didnt work so they did the surgery". denies heart symptoms for over 20 years;  sees  cardiology [facility] annually for EKGs   . CYSTOSCOPY W/ URETERAL STENT PLACEMENT Right 10/13/2017   Procedure: CYSTOSCOPY WITH STENT REPLACEMENT;  Surgeon: Ceasar Mons, MD;  Location: [facility];  Service: Urology;  Laterality: Right;  . EXCISION MASS UPPER EXTREMETIES Left 08/11/2016   Procedure: EXCISION MASS LEFT SHOULDER, LEFT FOREARM, LEFT WRIST;  Surgeon: Judeth Horn, MD;  Location: [facility];  Service: General;  Laterality: Left;  . HERNIA REPAIR     ingunial  . MOHS SURGERY    . PROSTATE SURGERY     prostatectomy  . TRANSURETHRAL RESECTION OF BLADDER TUMOR  N/A 10/13/2017   Procedure: TRANSURETHRAL RESECTION OF BLADDER TUMOR/ BIPOLAR (TURBT);  Surgeon: Ceasar Mons, MD;  Location: [facility];  Service: Urology;  Laterality: N/A;  ONLY NEEDS 90 MIN FOR BOTH PROCEDURES    Medications: Prior to Admission medications   Medication Sig Start Date End Date Taking? Authorizing Provider  acetaminophen (TYLENOL) 325 MG tablet Take 1 tablet (325 mg total) by mouth every 6 (six) hours as needed. Patient  taking differently: Take 325 mg by mouth every 6 (six) hours as needed.  02/06/16   Love, Ivan Anchors, PA-C  aspirin EC 81 MG tablet Take 81 mg by mouth daily.    [provider]  b complex vitamins tablet Take 1 tablet by mouth daily.    [provider]  calcium gluconate 500 MG tablet Take 500 mg by mouth daily.      [provider]  carboxymethylcellulose (REFRESH TEARS) 0.5 % SOLN Place 1 drop into both eyes 3 (three) times daily as needed (for dry/irritated eyes).    [provider]  cholecalciferol (VITAMIN D) 1000 UNITS tablet Take 1,000 Units by mouth daily.      [provider]  docusate sodium (COLACE) 100 MG capsule Take 1 capsule (100 mg total) by mouth 2 (two) times daily. 02/07/16   Love, Ivan Anchors, PA-C  famotidine (PEPCID) 20 MG tablet Take 1 tablet (20 mg total) by mouth 2 (two) times daily. 02/07/16   Love, Ivan Anchors, PA-C  HYDROcodone-acetaminophen (NORCO) 5-325 MG tablet Take 1 tablet by mouth every 4 (four) hours as needed for moderate pain. 10/13/17   Ceasar Mons, MD  levothyroxine (SYNTHROID, LEVOTHROID) 50 MCG tablet Take 1 tablet (50 mcg total) by mouth daily. 04/27/17   Marletta Lor, MD  Melatonin 5 MG TABS Take 2.5 mg by mouth at bedtime as needed (sleep).     [provider]  Multiple Vitamin (MULTIVITAMIN WITH MINERALS) TABS tablet Take 1 tablet by mouth daily.    [provider]  Multiple Vitamins-Minerals (PRESERVISION/LUTEIN PO) Take 2 tablets by mouth daily. Daily       [provider]  nitroGLYCERIN (NITROSTAT) 0.4 MG SL tablet Place 1 tablet (0.4 mg total) under the tongue every 5 (five) minutes as needed. Patient taking differently: Place 0.4 mg under the tongue every 5 (five) minutes as needed for chest pain.  12/31/15   Marletta Lor, MD  ondansetron [facility]) 4 MG tablet Take 1 tablet (4 mg total) by mouth daily as needed for nausea or vomiting. 10/13/17 10/13/18  Ceasar Mons, MD  phenazopyridine (PYRIDIUM) 200 MG tablet Take 1 tablet (200 mg total) by mouth 3 (three) times daily as needed for pain. 10/13/17 10/13/18  Ceasar Mons, MD  polyethylene glycol [facility]) packet Take 17 g by mouth 2 (two) times daily. 02/07/16   Love, Ivan Anchors, PA-C  pramipexole (MIRAPEX) 0.125 MG tablet 2 tablets  2 hours prior to bedtime Patient taking differently: Take 0.25 mg by mouth at bedtime. 2 tablets  2 hours prior to bedtime 07/08/17   Marletta Lor, MD  simvastatin (ZOCOR) 20 MG tablet Take 1 tablet (20 mg total) by mouth daily. Patient taking differently: Take 20 mg by mouth every evening.  04/27/17   Marletta Lor, MD    Allergies:  No Known Allergies  Social History:  reports that he quit smoking about 69 years ago. His smoking use included cigarettes. He has a 20.00 pack-year smoking history.  He has never used smokeless tobacco. He reports that he drinks alcohol. He reports that he does not use drugs.  Family History: Family History  Problem Relation Age of Onset  . Lung cancer Brother 61  . GI problems Sister     Physical Exam: Vitals:   11/13/17 2300 11/13/17 2330 11/14/17 0000 11/14/17 0030  BP: 125/68 121/75 125/73 122/79  Pulse: (!) 106 (!) 107 (!) 106 100  Resp: (!) 28 (!) 28 (!) 29 (!) 26  Temp:      TempSrc:      SpO2: 96% 94% 96% 96%    General:  Alert and oriented times three, well developed and nourished, no acute distress Eyes: PERRLA, pink conjunctiva, no scleral icterus ENT: Moist oral mucosa, neck supple, no thyromegaly Lungs: clear to ascultation, no wheeze, no crackles, no use of accessory muscles Cardiovascular: regular rate and rhythm, no regurgitation, no gallops, no murmurs. No carotid bruits, no JVD Abdomen: soft, positive BS, non-tender, non-distended, no organomegaly, not an acute abdomen GU: not examined Neuro: CN II - XII grossly intact, sensation intact Musculoskeletal: strength  5/5 all extremities, no clubbing, cyanosis or edema Skin: no rash, no subcutaneous crepitation, no decubitus Psych: appropriate patient   Labs on Admission:  Recent Labs    11/13/17 2119  NA 140  K 4.0  CL 107  CO2 23  GLUCOSE 178*  BUN 28*  CREATININE 1.78*  CALCIUM 8.5*   Recent Labs    11/13/17 2119  AST 39  ALT 17  ALKPHOS 80  BILITOT 0.6  PROT 6.4*  ALBUMIN 3.2*   No results for input(s): LIPASE, AMYLASE in the last 72 hours. Recent Labs    11/13/17 2119  WBC 9.3  NEUTROABS 8.2*  HGB 9.7*  HCT 32.0*  MCV 86.3  PLT 167   Recent Labs    11/13/17 2119  TROPONINI 0.21*   Invalid input(s): POCBNP No results for input(s): DDIMER in the last 72 hours. No results for input(s): HGBA1C in the last 72 hours. No results for input(s): CHOL, HDL, LDLCALC, TRIG, CHOLHDL, LDLDIRECT in the last 72 hours. No results for input(s): TSH, T4TOTAL, T3FREE, THYROIDAB in the last 72 hours.  Invalid input(s): FREET3 No results for input(s): VITAMINB12, FOLATE, FERRITIN, TIBC, IRON, RETICCTPCT in the last 72 hours.  Micro Results: No results found for this or any previous visit (from the past 240 hour(s)).   Radiological Exams on Admission: Dg Chest Port 1 View  Result Date: 11/13/2017 CLINICAL DATA:  Patient fell and was too weak to get up.  Dyspnea. EXAM: PORTABLE CHEST 1 VIEW COMPARISON:  01/23/2016 FINDINGS: Heart size and mediastinal contours are stable with post CABG change and moderate aortic atherosclerosis. Bibasilar atelectasis is redemonstrated without acute pneumonic consolidation, effusion or pneumothorax. Chronic left-sided rib fractures involving the posterior left sixth and seventh ribs. IMPRESSION: Persistent bibasilar atelectasis. Aortic atherosclerosis with borderline cardiomegaly. Chronic left-sided rib fractures. Electronically Signed   By: Ashley Royalty M.D.   On: 11/13/2017 21:38    Assessment/Plan Present on Admission: Community acquired  pneumonia/bronchitis -Admit to [facility] -Chest x-rays negative the patient with fever and shortness of breath with mild hypoxia. -Cultures 2, sputum cultures ordered -IV Rocephin and doxycycline -Oxygen and doing nebs ordered when necessary -By mouth prednisone  Elevated troponin -Patient denies chest pain but will cycle cardiac enzymes with increased troponin.  -Monitor in telemetry -Continue baby aspirin -Lipid panel in a.m., very low dose beta blocker initiated. -May also be due to elevated  creatinine will check a CK and CK-MB  Acute kidney injury -IV fluid hydration. BMP in a.m.  Weakness -Likely due to above. Monitor, PT consult  . Coronary atherosclerosis -See above  . Dyslipidemia -Stable, resume home meds  . Essential hypertension -Stable, resume home meds  . Fall -PT consult  . Hypothyroidism -Stable, resume home meds  .    Tanny Harnack 11/14/2017, 12:55 AM

## 2017-11-14 NOTE — Progress Notes (Signed)
PHARMACY - PHYSICIAN COMMUNICATION CRITICAL VALUE ALERT - BLOOD CULTURE IDENTIFICATION (BCID)  Raymond Swanson is an 84 y.o. male who presented to [facility] on 11/13/2017 with a chief complaint of fall/weakess, now with enterococcal bacteremia  Name of physician (or Provider) Contacted: Dr Carolin Sicks, Dr Baxter Flattery  Plan: Dc ctx doxy Add amp 2 g q8h Add vanc 750 mg q24h Monitor suscept, renal fx, vt prn, ID recs  Results for orders placed or performed during the hospital encounter of 11/13/17  Blood Culture ID Panel (Reflexed) (Collected: 11/13/2017  9:20 PM)  Result Value Ref Range   Enterococcus species DETECTED (A) NOT DETECTED   Vancomycin resistance NOT DETECTED NOT DETECTED   Listeria monocytogenes NOT DETECTED NOT DETECTED   Staphylococcus species NOT DETECTED NOT DETECTED   Staphylococcus aureus NOT DETECTED NOT DETECTED   Streptococcus species NOT DETECTED NOT DETECTED   Streptococcus agalactiae NOT DETECTED NOT DETECTED   Streptococcus pneumoniae NOT DETECTED NOT DETECTED   Streptococcus pyogenes NOT DETECTED NOT DETECTED   Acinetobacter baumannii NOT DETECTED NOT DETECTED   Enterobacteriaceae species NOT DETECTED NOT DETECTED   Enterobacter cloacae complex NOT DETECTED NOT DETECTED   Escherichia coli NOT DETECTED NOT DETECTED   Klebsiella oxytoca NOT DETECTED NOT DETECTED   Klebsiella pneumoniae NOT DETECTED NOT DETECTED   Proteus species NOT DETECTED NOT DETECTED   Serratia marcescens NOT DETECTED NOT DETECTED   Haemophilus influenzae NOT DETECTED NOT DETECTED   Neisseria meningitidis NOT DETECTED NOT DETECTED   Pseudomonas aeruginosa NOT DETECTED NOT DETECTED   Candida albicans NOT DETECTED NOT DETECTED   Candida glabrata NOT DETECTED NOT DETECTED   Candida krusei NOT DETECTED NOT DETECTED   Candida parapsilosis NOT DETECTED NOT DETECTED   Candida tropicalis NOT DETECTED NOT DETECTED   Levester Fresh, PharmD, BCPS, BCCCP Clinical Pharmacist Clinical phone for  11/14/2017 from 7a-3:30p: [phone] If after 3:30p, please call main pharmacy at: [phone] 11/14/2017 11:52 AM

## 2017-11-14 NOTE — ED Notes (Signed)
Ordered diet tray for pt  

## 2017-11-14 NOTE — ED Notes (Signed)
Admitting paged per RN

## 2017-11-14 NOTE — ED Notes (Signed)
Pt has breakfast tray at bedside.  Assisted pt with tray set-up.

## 2017-11-15 ENCOUNTER — Other Ambulatory Visit (HOSPITAL_COMMUNITY): Payer: Medicare Other

## 2017-11-15 DIAGNOSIS — J209 Acute bronchitis, unspecified: Secondary | ICD-10-CM

## 2017-11-15 DIAGNOSIS — E039 Hypothyroidism, unspecified: Secondary | ICD-10-CM

## 2017-11-15 DIAGNOSIS — Z515 Encounter for palliative care: Secondary | ICD-10-CM

## 2017-11-15 DIAGNOSIS — R7881 Bacteremia: Secondary | ICD-10-CM

## 2017-11-15 DIAGNOSIS — R011 Cardiac murmur, unspecified: Secondary | ICD-10-CM

## 2017-11-15 DIAGNOSIS — Z8551 Personal history of malignant neoplasm of bladder: Secondary | ICD-10-CM

## 2017-11-15 DIAGNOSIS — B952 Enterococcus as the cause of diseases classified elsewhere: Secondary | ICD-10-CM

## 2017-11-15 DIAGNOSIS — Z8546 Personal history of malignant neoplasm of prostate: Secondary | ICD-10-CM

## 2017-11-15 DIAGNOSIS — R062 Wheezing: Secondary | ICD-10-CM

## 2017-11-15 DIAGNOSIS — R0989 Other specified symptoms and signs involving the circulatory and respiratory systems: Secondary | ICD-10-CM

## 2017-11-15 DIAGNOSIS — C674 Malignant neoplasm of posterior wall of bladder: Secondary | ICD-10-CM

## 2017-11-15 LAB — BASIC METABOLIC PANEL
ANION GAP: 11 (ref 5–15)
BUN: 37 mg/dL — ABNORMAL HIGH (ref 6–20)
CHLORIDE: 105 mmol/L (ref 101–111)
CO2: 27 mmol/L (ref 22–32)
Calcium: 8 mg/dL — ABNORMAL LOW (ref 8.9–10.3)
Creatinine, Ser: 1.74 mg/dL — ABNORMAL HIGH (ref 0.61–1.24)
GFR calc Af Amer: 38 mL/min — ABNORMAL LOW (ref >60)
GFR calc non Af Amer: 33 mL/min — ABNORMAL LOW (ref >60)
GLUCOSE: 144 mg/dL — AB (ref 65–99)
POTASSIUM: 3.5 mmol/L (ref 3.5–5.1)
Sodium: 143 mmol/L (ref 135–145)

## 2017-11-15 LAB — CBC
HEMATOCRIT: 33.2 % — AB (ref 39.0–52.0)
Hemoglobin: 10.1 g/dL — ABNORMAL LOW (ref 13.0–17.0)
MCH: 26.4 pg (ref 26.0–34.0)
MCHC: 30.4 g/dL (ref 30.0–36.0)
MCV: 86.9 fL (ref 78.0–100.0)
Platelets: 148 10*3/uL — ABNORMAL LOW (ref 150–400)
RBC: 3.82 MIL/uL — ABNORMAL LOW (ref 4.22–5.81)
RDW: 15.1 % (ref 11.5–15.5)
WBC: 8.8 10*3/uL (ref 4.0–10.5)

## 2017-11-15 LAB — URINE CULTURE

## 2017-11-15 LAB — TSH: TSH: 3.979 u[IU]/mL (ref 0.350–4.500)

## 2017-11-15 MED ORDER — HALOPERIDOL LACTATE 5 MG/ML IJ SOLN
2.0000 mg | Freq: Four times a day (QID) | INTRAMUSCULAR | Status: DC | PRN
  Filled 2017-11-15: qty 1

## 2017-11-15 MED ORDER — FAMOTIDINE 20 MG PO TABS
20.0000 mg | ORAL_TABLET | Freq: Every day | ORAL | Status: DC
  Administered 2017-11-16 – 2017-11-17 (×2): 20 mg via ORAL
  Filled 2017-11-15 (×2): qty 1

## 2017-11-15 MED ORDER — OLANZAPINE 5 MG PO TBDP
2.5000 mg | ORAL_TABLET | Freq: Every day | ORAL | Status: DC
  Administered 2017-11-16 – 2017-11-17 (×3): 2.5 mg via ORAL
  Filled 2017-11-15 (×3): qty 0.5

## 2017-11-15 MED ORDER — SODIUM CHLORIDE 0.9 % IV SOLN
3.0000 g | Freq: Two times a day (BID) | INTRAVENOUS | Status: DC
  Administered 2017-11-15 – 2017-11-17 (×5): 3 g via INTRAVENOUS
  Filled 2017-11-15 (×5): qty 3

## 2017-11-15 MED ORDER — FUROSEMIDE 40 MG PO TABS
40.0000 mg | ORAL_TABLET | Freq: Every day | ORAL | Status: DC
  Administered 2017-11-16 – 2017-11-18 (×3): 40 mg via ORAL
  Filled 2017-11-15 (×3): qty 1

## 2017-11-15 NOTE — Progress Notes (Signed)
PROGRESS NOTE    Raymond Swanson  PJK:[id number] DOB: Dec 08, 1924 DOA: 11/13/2017 PCP: Marletta Lor, MD   Brief Narrative: 84 year old male with history of hypertension, hypothyroidism, hyperlipidemia, presented to the ER for generalized weakness, fall, cough and temperature of 101.7.  Patient was found to have mild elevation in troponin and admitted for respiratory distress:  Assessment & Plan:   #Acute respiratory distress, acute respiratory failure with hypoxia: Chest x-ray with atelectasis and cardiomegaly.  Patient has elevated BNP level.  Treated with IV Lasix.  His shortness of breath and hypoxia is gradually improving today.  Switch to oral Lasix.  Follow-up echocardiogram. -Also treated with bronchodilators, steroid for possible acute bronchitis.  Continue to provide oxygen and supportive care.  #Enterococcal bacteremia: Source unknown.  He had by ID.  Continue Unasyn.  Follow-up echocardiogram and repeat culture today.  #Acute kidney injury on chronic kidney disease stage III: Monitor BMP.  Serum creatinine level stable.  On diuretics.  Urine contaminant.  #Hypertensive urgency on admission: Blood pressure better controlled.  On Coreg.  Received a dose of Norvasc today which is discontinued.  On diuretics and hydralazine as needed.  #Generalized weakness/fall: PT OT evaluation.  #Hypothyroidism: Check TSH.  Continue Synthroid.  #GOC: Palliative care meeting today.  DVT prophylaxis: Heparin subcu Code Status: DNR after palliative care discussed with the family Family Communication: Discussed with the patient's wife yesterday Disposition Plan: Social worker consulted for SNF.  PT OT evaluation.    Consultants:   Palliative care  Infectious disease  Procedures: Echo Antimicrobials: Ampicillin  Subjective: Seen and examined at bedside.  Was sitting on chair.  Looks more comfortable.  Reported shortness of breath is better.  Has some cough.  Denies chest pain,  nausea vomiting.  Objective: Vitals:   11/15/17 0732 11/15/17 1014 11/15/17 1015 11/15/17 1229  BP: 139/67 (!) 158/57 (!) 158/57 (!) 131/96  Pulse:   93   Resp: 19     Temp: 98.4 F (36.9 C)   98.1 F (36.7 C)  TempSrc: Oral   Oral  SpO2: (!) 87%   93%  Weight:      Height:        Intake/Output Summary (Last 24 hours) at 11/15/2017 1249 Last data filed at 11/15/2017 0446 Gross per 24 hour  Intake 990 ml  Output 1850 ml  Net -860 ml   Filed Weights   11/14/17 1644  Weight: 59.8 kg (131 lb 13.4 oz)    Examination:  General exam: Appears calm and comfortable  Respiratory system: Bibasal coarse breath sound, respiratory effort normal, Cardiovascular system: S1 & S2 heard, RRR.  Trace pedal edema. Gastrointestinal system: Abdomen is nondistended, soft and nontender. Normal bowel sounds heard. Central nervous system: Alert awake and following commands Extremities: Symmetric 5 x 5 power. Skin: No rashes, lesions or ulcers     Data Reviewed: I have personally reviewed following labs and imaging studies  CBC: Recent Labs  Lab 11/13/17 2119 11/14/17 0418 11/14/17 1508 11/15/17 0134  WBC 9.3 7.8 11.9* 8.8  NEUTROABS 8.2*  --   --   --   HGB 9.7* 8.8* 10.8* 10.1*  HCT 32.0* 28.6* 35.7* 33.2*  MCV 86.3 87.2 89.5 86.9  PLT 167 133* 175 809*   Basic Metabolic Panel: Recent Labs  Lab 11/13/17 2119 11/14/17 0418 11/14/17 1508 11/15/17 0134  NA 140 143 140 143  K 4.0 3.9 4.7 3.5  CL 107 110 109 105  CO2 23 21* 20* 27  GLUCOSE 178*  136* 227* 144*  BUN 28* 27* 32* 37*  CREATININE 1.78* 1.75* 1.69* 1.74*  CALCIUM 8.5* 7.7* 8.0* 8.0*   GFR: Estimated Creatinine Clearance: 23.4 mL/min (A) (by C-G formula based on SCr of 1.74 mg/dL (H)). Liver Function Tests: Recent Labs  Lab 11/13/17 2119  AST 39  ALT 17  ALKPHOS 80  BILITOT 0.6  PROT 6.4*  ALBUMIN 3.2*   No results for input(s): LIPASE, AMYLASE in the last 168 hours. No results for input(s): AMMONIA in  the last 168 hours. Coagulation Profile: No results for input(s): INR, PROTIME in the last 168 hours. Cardiac Enzymes: Recent Labs  Lab 11/13/17 2119 11/14/17 0418 11/14/17 0935  TROPONINI 0.21* 0.27* 0.20*   BNP (last 3 results) No results for input(s): PROBNP in the last 8760 hours. HbA1C: No results for input(s): HGBA1C in the last 72 hours. CBG: No results for input(s): GLUCAP in the last 168 hours. Lipid Profile: Recent Labs    11/14/17 0418  CHOL 94  HDL 43  LDLCALC 42  TRIG 43  CHOLHDL 2.2   Thyroid Function Tests: No results for input(s): TSH, T4TOTAL, FREET4, T3FREE, THYROIDAB in the last 72 hours. Anemia Panel: No results for input(s): VITAMINB12, FOLATE, FERRITIN, TIBC, IRON, RETICCTPCT in the last 72 hours. Sepsis Labs: Recent Labs  Lab 11/13/17 2141 11/14/17 0055 11/14/17 1508  LATICACIDVEN 1.09 0.54 1.3    Recent Results (from the past 240 hour(s))  Blood Culture (routine x 2)     Status: Abnormal (Preliminary result)   Collection Time: 11/13/17  9:20 PM  Result Value Ref Range Status   Specimen Description BLOOD RIGHT FOREARM  Final   Special Requests   Final    BOTTLES DRAWN AEROBIC AND ANAEROBIC Blood Culture adequate volume   Culture  Setup Time   Final    GRAM POSITIVE COCCI IN PAIRS IN CHAINS IN BOTH AEROBIC AND ANAEROBIC BOTTLES CRITICAL RESULT CALLED TO, READ BACK BY AND VERIFIED WITH: Ailene Rud AT 1144 11/14/17 BY L BENFIELD Performed at [facility], [place] [address], [place], [place] [address]    Culture ENTEROCOCCUS FAECALIS (A)  Final   Report Status PENDING  Incomplete  Blood Culture ID Panel (Reflexed)     Status: Abnormal   Collection Time: 11/13/17  9:20 PM  Result Value Ref Range Status   Enterococcus species DETECTED (A) NOT DETECTED Final    Comment: CRITICAL RESULT CALLED TO, READ BACK BY AND VERIFIED WITH: M MACCIA,PHARMD AT 1144 11/14/17 BY L BENFIELD    Vancomycin resistance NOT DETECTED NOT DETECTED Final    Listeria monocytogenes NOT DETECTED NOT DETECTED Final   Staphylococcus species NOT DETECTED NOT DETECTED Final   Staphylococcus aureus NOT DETECTED NOT DETECTED Final   Streptococcus species NOT DETECTED NOT DETECTED Final   Streptococcus agalactiae NOT DETECTED NOT DETECTED Final   Streptococcus pneumoniae NOT DETECTED NOT DETECTED Final   Streptococcus pyogenes NOT DETECTED NOT DETECTED Final   Acinetobacter baumannii NOT DETECTED NOT DETECTED Final   Enterobacteriaceae species NOT DETECTED NOT DETECTED Final   Enterobacter cloacae complex NOT DETECTED NOT DETECTED Final   Escherichia coli NOT DETECTED NOT DETECTED Final   Klebsiella oxytoca NOT DETECTED NOT DETECTED Final   Klebsiella pneumoniae NOT DETECTED NOT DETECTED Final   Proteus species NOT DETECTED NOT DETECTED Final   Serratia marcescens NOT DETECTED NOT DETECTED Final   Haemophilus influenzae NOT DETECTED NOT DETECTED Final   Neisseria meningitidis NOT DETECTED NOT DETECTED Final   Pseudomonas aeruginosa  NOT DETECTED NOT DETECTED Final   Candida albicans NOT DETECTED NOT DETECTED Final   Candida glabrata NOT DETECTED NOT DETECTED Final   Candida krusei NOT DETECTED NOT DETECTED Final   Candida parapsilosis NOT DETECTED NOT DETECTED Final   Candida tropicalis NOT DETECTED NOT DETECTED Final    Comment: Performed at [facility], [place] [address] [place], [place] [address]  Blood Culture (routine x 2)     Status: Abnormal (Preliminary result)   Collection Time: 11/13/17  9:29 PM  Result Value Ref Range Status   Specimen Description BLOOD BLOOD LEFT FOREARM  Final   Special Requests   Final    BOTTLES DRAWN AEROBIC AND ANAEROBIC Blood Culture adequate volume   Culture  Setup Time   Final    GRAM POSITIVE COCCI IN PAIRS IN CHAINS IN BOTH AEROBIC AND ANAEROBIC BOTTLES CRITICAL RESULT CALLED TO, READ BACK BY AND VERIFIED WITH: Ailene Rud AT 2778 11/14/17 BY L BENFIELD Performed at [facility], [place]  [address], [place], [place] [address]    Culture ENTEROCOCCUS FAECALIS (A)  Final   Report Status PENDING  Incomplete  Urine culture     Status: Abnormal   Collection Time: 11/14/17 12:57 AM  Result Value Ref Range Status   Specimen Description URINE, RANDOM  Final   Special Requests   Final    NONE Performed at [facility], [place] [address] [place], [place] [address]    Culture MULTIPLE SPECIES PRESENT, SUGGEST RECOLLECTION (A)  Final   Report Status 11/15/2017 FINAL  Final  MRSA PCR Screening     Status: None   Collection Time: 11/14/17  4:28 PM  Result Value Ref Range Status   MRSA by PCR NEGATIVE NEGATIVE Final    Comment:        The GeneXpert MRSA Assay (FDA approved for NASAL specimens only), is one component of a comprehensive MRSA colonization surveillance program. It is not intended to diagnose MRSA infection nor to guide or monitor treatment for MRSA infections. Performed at [facility], [place] [address], [place], [place] [address]          Radiology Studies: Dg Chest Port 1 View  Result Date: 11/14/2017 CLINICAL DATA:  Shortness of breath after fall. EXAM: PORTABLE CHEST 1 VIEW COMPARISON:  11/13/2017 FINDINGS: Sternotomy wires unchanged. Lungs are adequately inflated with minimal linear density in the left base unchanged likely atelectasis. No definite effusion. No pneumothorax. Mild stable cardiomegaly. Calcified plaque over the aortic arch. Old left posterior rib fractures. Degenerative change of the spine. IMPRESSION: Minimal stable linear atelectasis left base. Stable mild cardiomegaly. Electronically Signed   By: Marin Olp M.D.   On: 11/14/2017 14:43   Dg Chest Port 1 View  Result Date: 11/13/2017 CLINICAL DATA:  Patient fell and was too weak to get up.  Dyspnea. EXAM: PORTABLE CHEST 1 VIEW COMPARISON:  01/23/2016 FINDINGS: Heart size and mediastinal contours are stable with post CABG change and moderate aortic atherosclerosis. Bibasilar atelectasis  is redemonstrated without acute pneumonic consolidation, effusion or pneumothorax. Chronic left-sided rib fractures involving the posterior left sixth and seventh ribs. IMPRESSION: Persistent bibasilar atelectasis. Aortic atherosclerosis with borderline cardiomegaly. Chronic left-sided rib fractures. Electronically Signed   By: Ashley Royalty M.D.   On: 11/13/2017 21:38        Scheduled Meds: . aspirin EC  81 mg Oral Daily  . carvedilol  3.125 mg Oral BID WC  . famotidine  20 mg Oral  BID  . furosemide  40 mg Oral Daily  . heparin  5,000 Units Subcutaneous Q8H  . levothyroxine  50 mcg Oral QAC breakfast  . simvastatin  20 mg Oral q1800   Continuous Infusions: . ampicillin-sulbactam (UNASYN) IV       LOS: 1 day    Dron Tanna Furry, MD [facility] Pager [phone]  If 7PM-7AM, please contact night-coverage [email or web address] Password [facility] 11/15/2017, 12:49 PM

## 2017-11-15 NOTE — Clinical Social Work Note (Signed)
Clinical Social Work Assessment  Patient Details  Name: Raymond Swanson MRN: [id number] Date of Birth: 1924-12-26  Date of referral:  11/15/17               Reason for consult:  Facility Placement                Permission sought to share information with:  Chartered certified accountant granted to share information::  Yes, Verbal Permission Granted  Name::     Emergency planning/management officer::  SNF  Relationship::  wife  Contact Information:     Housing/Transportation Living arrangements for the past 2 months:  [place] of Information:  Patient, Spouse Patient Interpreter Needed:  None Criminal Activity/Legal Involvement Pertinent to Current Situation/Hospitalization:  No - Comment as needed Significant Relationships:  Spouse Lives with:  Spouse Do you feel safe going back to the place where you live?  No Need for family participation in patient care:  No (Coment)  Care giving concerns:  Pt lives at home with elderly spouse.  Pt currently very weak and there are concerns about pt safety to return home.   Social Worker assessment / plan:  CSW spoke with pt and pt wife concerning PT recommendation for SNF.  Explained SNF and SNF referral process.  Employment status:  Retired Nurse, adult PT Recommendations:  [place] / Referral to community resources:  [place]  Patient/Family's Response to care:  Pt and wife agreeable to pt transition to SNF for short term rehab prior to return home.  Patient/Family's Understanding of and Emotional Response to Diagnosis, Current Treatment, and Prognosis:  Pt seemed somewhat confused but this could partially be to hearing impairment- seems anxious to leave hospital but is realistic about possible need for increased care at time of DC.  Emotional Assessment Appearance:  Appears stated age Attitude/Demeanor/Rapport:    Affect (typically observed):  Appropriate,  Pleasant Orientation:  Oriented to Self, Oriented to Place, Oriented to  Time, Oriented to Situation Alcohol / Substance use:  Not Applicable Psych involvement (Current and /or in the community):  No (Comment)  Discharge Needs  Concerns to be addressed:  Care Coordination Readmission within the last 30 days:  No Current discharge risk:  Physical Impairment Barriers to Discharge:  Continued Medical Work up   Jorge Ny, LCSW 11/15/2017, 1:07 PM

## 2017-11-15 NOTE — Care Management Note (Signed)
Case Management Note  Patient Details  Name: Raymond Swanson MRN: [id number] Date of Birth: 1924-11-13  Subjective/Objective:  From home with wirfe, has   hx of CAD, hx of Prostate CA, and most recently bladder cancer admitted for malaise, weakness, ground level fall. Found to have enterococcal bacteremia, but some respiratory distress from mild CHF. ID is following , plan for TTE.                 Action/Plan: NCM will follow for transition of care needs.   Expected Discharge Date:                  Expected Discharge Plan:     In-House Referral:     Discharge planning Services  CM Consult  Post Acute Care Choice:    Choice offered to:     DME Arranged:    DME Agency:     HH Arranged:    HH Agency:     Status of Service:  In process, will continue to follow  If discussed at Long Length of Stay Meetings, dates discussed:    Additional Comments:  Zenon Mayo, RN 11/15/2017, 10:07 AM

## 2017-11-15 NOTE — Progress Notes (Signed)
SATURATION QUALIFICATIONS: (This note is used to comply with regulatory documentation for home oxygen)  Patient Saturations on Room Air at Rest = 85%  Patient Saturations on Room Air while Ambulating = N/A  Patient Saturations on 4 Liters of oxygen while Ambulating = 93%  Please briefly explain why patient needs home oxygen:Pt requires O2 at rest and with activity to keep sats >90%.   [place] [phone] (pager)

## 2017-11-15 NOTE — Consult Note (Signed)
Consultation Note Date: 11/15/2017   Patient Name: Raymond Swanson  DOB: 1925-07-17  MRN: [id number]  Age / Sex: 84 y.o., male  PCP: Marletta Lor, MD Referring Physician: Rosita Fire, MD  Reason for Consultation: Establishing goals of care  HPI/Patient Profile: 84 y.o. male  with past medical history of prostate CA, bladder cancer (10/15/2017) s/p TURBT and right JJ stent placed, who was admitted on 11/13/2017 with a fall and malaise.  Work up revealed enterococcal bacteremia.  ID is consulted. He developed respiratory distress on 3/24.    Clinical Assessment and Goals of Care:  I have reviewed medical records including EPIC notes, labs and imaging, received report from the care team, assessed the patient and then met at the bedside along with his wife to discuss diagnosis prognosis, GOC, EOL wishes, disposition and options.  I introduced Palliative Medicine as specialized medical care for people living with serious illness. It focuses on providing relief from the symptoms and stress of a serious illness. The goal is to improve quality of life for both the patient and the family.  We discussed a brief life review of the patient. 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  He has 1 son who is a Geophysicist/field seismologist in [place].  He is married and lives at home with his wife Sunday Spillers.  As far as functional and nutritional status he loves to eat and cook! Unfortunately the recent treatment for his bladder tumor has affected his taste so he no longer enjoys eating.  However he tells me his taste buds will grow back in 16 weeks.  He loves to be outside working in his garden.  Being active is very important to him.  He loves working on his computer, reading (1-2 books of fiction per week), and eating.  He states if he can't live life on his own terms he does not want to be here.  He has had  difficulty recently - since he turned 74 he has had to come to terms with not being able to control some things in his life.  He asserts that Quality of life is much more important to him than Quantity of life.  He wants to make due with less medical treatment rather than more.  As his wife hears him state that he is a DNR and does not want resuscitation if he arrests - she cries and says she wants to keep him here as long as possible.  Mr. Moody is having an echocardiogram done - I promised his wife to come back tomorrow at 11:00 to meet with her and their son.  We will review his test results and medical condition.   Primary Decision Maker:  PATIENT    SUMMARY OF RECOMMENDATIONS    Change code status to DNR. Mr. Shaff insists QUALITY of life over Quantity. PMT will return tomorrow at 11:00 to review test results and meet with son Richardson Landry.  Code Status/Advance Care Planning:  DNR   Psycho-social/Spiritual:  Desire for further Chaplaincy support: yes  Prognosis: less than 6 months    Discharge Planning: To Be Determined      Primary Diagnoses: Present on Admission: . Coronary atherosclerosis . Dyslipidemia . Essential hypertension . Fall . Hypothyroidism . Pneumonia . Bacteremia   I have reviewed the medical record, interviewed the patient and family, and examined the patient. The following aspects are pertinent.  Past Medical History:  Diagnosis Date  . ACUT GASTR ULCER W/HEMORR W/O MENTION OBST 09/10/2009  . ANEMIA 10/08/2009  . Cancer ([place])   . CORONARY ARTERY DISEASE 01/26/2007  . Fall 2016   fall from deck while cleaning gutters; sustained subdemral hematoma ; doing ok now   . History of kidney stones   . HOH (hard of hearing)   . HYPERLIPIDEMIA 01/26/2007  . HYPERTENSION 01/26/2007  . HYPOTHYROIDISM 10/18/2007  . MELENA 08/28/2009  . PROSTATE CANCER, HX OF 01/26/2007  . SKIN CANCER, HX OF 01/26/2007   Social History   Socioeconomic History  . Marital status:  Married    Spouse name: Not on file  . Number of children: Not on file  . Years of education: Not on file  . Highest education level: Not on file  Occupational History  . Not on file  Social Needs  . Financial resource strain: Not on file  . Food insecurity:    Worry: Not on file    Inability: Not on file  . Transportation needs:    Medical: Not on file    Non-medical: Not on file  Tobacco Use  . Smoking status: Former Smoker    Packs/day: 1.00    Years: 20.00    Pack years: 20.00    Types: Cigarettes    Last attempt to quit: 10/28/1948    Years since quitting: 69.0  . Smokeless tobacco: Never Used  Substance and Sexual Activity  . Alcohol use: Yes    Comment: 4 oz. wine daily  . Drug use: No  . Sexual activity: Not on file  Lifestyle  . Physical activity:    Days per week: Not on file    Minutes per session: Not on file  . Stress: Not on file  Relationships  . Social connections:    Talks on phone: Not on file    Gets together: Not on file    Attends religious service: Not on file    Active member of club or organization: Not on file    Attends meetings of clubs or organizations: Not on file    Relationship status: Not on file  Other Topics Concern  . Not on file  Social History Narrative  . Not on file   Family History  Problem Relation Age of Onset  . Lung cancer Brother 36  . GI problems Sister    Scheduled Meds: . aspirin EC  81 mg Oral Daily  . carvedilol  3.125 mg Oral BID WC  . famotidine  20 mg Oral BID  . furosemide  40 mg Oral Daily  . heparin  5,000 Units Subcutaneous Q8H  . levothyroxine  50 mcg Oral QAC breakfast  . simvastatin  20 mg Oral q1800   Continuous Infusions: . ampicillin-sulbactam (UNASYN) IV     PRN Meds:.hydrALAZINE, HYDROcodone-acetaminophen, ipratropium-albuterol, Melatonin, morphine injection, nitroGLYCERIN No Known Allergies Review of Systems complains of absence of taste, SOB, denies pain  Physical Exam  Well developed  elderly male.  HOH, awake, alert cooperative coherent Mild increased WOB, productive cough.  Vital Signs: BP Marland Kitchen)  131/96 (BP Location: Left Arm)   Pulse 93   Temp 98.1 F (36.7 C) (Oral)   Resp 19   Ht 5' 10"  (1.778 m)   Wt 59.8 kg (131 lb 13.4 oz)   SpO2 93%   BMI 18.92 kg/m  Pain Scale: 0-10   Pain Score: 0-No pain   SpO2: SpO2: 93 % O2 Device:SpO2: 93 % O2 Flow Rate: .O2 Flow Rate (L/min): 3 L/min  IO: Intake/output summary:   Intake/Output Summary (Last 24 hours) at 11/15/2017 1318 Last data filed at 11/15/2017 0446 Gross per 24 hour  Intake 990 ml  Output 1850 ml  Net -860 ml    LBM:   Baseline Weight: Weight: 59.8 kg (131 lb 13.4 oz) Most recent weight: Weight: 59.8 kg (131 lb 13.4 oz)     Palliative Assessment/Data: 50%     Time In: 11:30 Time Out: 12:40 Time Total: 70 min. Greater than 50%  of this time was spent counseling and coordinating care related to the above assessment and plan.  Signed by: Florentina Jenny, PA-C Palliative Medicine Pager: [phone]  Please contact Palliative Medicine Team phone at [phone] for questions and concerns.  For individual provider: See Shea Evans

## 2017-11-15 NOTE — Progress Notes (Signed)
Raymond Swanson for Infectious Disease  Date of Admission:  11/13/2017      Total days of antibiotics 3  Day 1 unasyn           ASSESSMENT: 84 y.o. male with enterococcal bacteremia. He is feeling much better this morning and breathing is improved overall; remains on a few liters of oxygen which he does not use at home. He had bladder tumor removal approx 4 weeks ago with foley catheter removed 1 week post op and has not yet begun his treatments for cancer.   PLAN: 1. Will stop vancomycin and change to ampicillin-sulbactam to cover for possible pneumonia and his enterococcal bacteremia. Will adjust based on sensitivities.  2. Wait for TTE for now and further work up pending palliative care conversations. He does not have cardiac device in place.   Active Problems:   Hypothyroidism   Dyslipidemia   Essential hypertension   Coronary atherosclerosis   PROSTATE CANCER, HX OF   Fall   Pneumonia   Bacteremia   Acute bronchitis   Malignant neoplasm of posterior wall of urinary bladder ([place])   Palliative care encounter   . aspirin EC  81 mg Oral Daily  . carvedilol  3.125 mg Oral BID WC  . famotidine  20 mg Oral BID  . furosemide  40 mg Oral Daily  . heparin  5,000 Units Subcutaneous Q8H  . levothyroxine  50 mcg Oral QAC breakfast  . simvastatin  20 mg Oral q1800    SUBJECTIVE: Feeling much better today than yesterday. Still on some oxygen but breathing effort much better. He does not recall much of his illness leading up to hospitalization. His wife is at the bedside. Fevers have subsided and no chills. He has felt well since his bladder tumor removal approx 4 weeks ago and was getting ready to start treatments for this cancer prior to being hospitalized. Tolerating his antibiotics well without side effects.   Review of Systems  Constitutional: Negative for chills and fever.  HENT: Negative for tinnitus.   Eyes: Negative for blurred vision and photophobia.    Respiratory: Negative for cough and sputum production.   Cardiovascular: Negative for chest pain.  Gastrointestinal: Negative for diarrhea, nausea and vomiting.  Genitourinary: Negative for dysuria, flank pain, frequency and hematuria.  Skin: Negative for rash.  Neurological: Negative for headaches.   No Known Allergies  OBJECTIVE: Vitals:   11/15/17 0732 11/15/17 1014 11/15/17 1015 11/15/17 1229  BP: 139/67 (!) 158/57 (!) 158/57 (!) 131/96  Pulse:   93   Resp: 19     Temp: 98.4 F (36.9 C)   98.1 F (36.7 C)  TempSrc: Oral   Oral  SpO2: (!) 87%   93%  Weight:      Height:       Body mass index is 18.92 kg/m.  Physical Exam  Constitutional: He is oriented to person, place, and time and well-developed, well-nourished, and in no distress.  HENT:  Mouth/Throat: Oropharynx is clear and moist. No oral lesions. Normal dentition. No dental caries.  Eyes: No scleral icterus.  Cardiovascular: Normal rate, regular rhythm and normal heart sounds.  No murmur heard. Pulmonary/Chest: Effort normal. No respiratory distress. He has wheezes. He has rales.  Abdominal: Soft. He exhibits no distension. There is no tenderness.  Musculoskeletal: Normal range of motion.  Lymphadenopathy:    He has no cervical adenopathy.  Neurological: He is alert and oriented to person, place, and  time.  Skin: Skin is warm and dry. No rash noted.  Psychiatric: Mood and affect normal.   Lab Results Lab Results  Component Value Date   WBC 8.8 11/15/2017   HGB 10.1 (L) 11/15/2017   HCT 33.2 (L) 11/15/2017   MCV 86.9 11/15/2017   PLT 148 (L) 11/15/2017    Lab Results  Component Value Date   CREATININE 1.74 (H) 11/15/2017   BUN 37 (H) 11/15/2017   NA 143 11/15/2017   K 3.5 11/15/2017   CL 105 11/15/2017   CO2 27 11/15/2017    Lab Results  Component Value Date   ALT 17 11/13/2017   AST 39 11/13/2017   ALKPHOS 80 11/13/2017   BILITOT 0.6 11/13/2017     Microbiology: Recent Results (from the  past 240 hour(s))  Blood Culture (routine x 2)     Status: Abnormal (Preliminary result)   Collection Time: 11/13/17  9:20 PM  Result Value Ref Range Status   Specimen Description BLOOD RIGHT FOREARM  Final   Special Requests   Final    BOTTLES DRAWN AEROBIC AND ANAEROBIC Blood Culture adequate volume   Culture  Setup Time   Final    GRAM POSITIVE COCCI IN PAIRS IN CHAINS IN BOTH AEROBIC AND ANAEROBIC BOTTLES CRITICAL RESULT CALLED TO, READ BACK BY AND VERIFIED WITH: Ailene Rud AT 1144 11/14/17 BY L BENFIELD Performed at [facility], [place] [address], [place], [place] [address]    Culture ENTEROCOCCUS FAECALIS (A)  Final   Report Status PENDING  Incomplete  Blood Culture ID Panel (Reflexed)     Status: Abnormal   Collection Time: 11/13/17  9:20 PM  Result Value Ref Range Status   Enterococcus species DETECTED (A) NOT DETECTED Final    Comment: CRITICAL RESULT CALLED TO, READ BACK BY AND VERIFIED WITH: M MACCIA,PHARMD AT 1144 11/14/17 BY L BENFIELD    Vancomycin resistance NOT DETECTED NOT DETECTED Final   Listeria monocytogenes NOT DETECTED NOT DETECTED Final   Staphylococcus species NOT DETECTED NOT DETECTED Final   Staphylococcus aureus NOT DETECTED NOT DETECTED Final   Streptococcus species NOT DETECTED NOT DETECTED Final   Streptococcus agalactiae NOT DETECTED NOT DETECTED Final   Streptococcus pneumoniae NOT DETECTED NOT DETECTED Final   Streptococcus pyogenes NOT DETECTED NOT DETECTED Final   Acinetobacter baumannii NOT DETECTED NOT DETECTED Final   Enterobacteriaceae species NOT DETECTED NOT DETECTED Final   Enterobacter cloacae complex NOT DETECTED NOT DETECTED Final   Escherichia coli NOT DETECTED NOT DETECTED Final   Klebsiella oxytoca NOT DETECTED NOT DETECTED Final   Klebsiella pneumoniae NOT DETECTED NOT DETECTED Final   Proteus species NOT DETECTED NOT DETECTED Final   Serratia marcescens NOT DETECTED NOT DETECTED Final   Haemophilus influenzae NOT DETECTED  NOT DETECTED Final   Neisseria meningitidis NOT DETECTED NOT DETECTED Final   Pseudomonas aeruginosa NOT DETECTED NOT DETECTED Final   Candida albicans NOT DETECTED NOT DETECTED Final   Candida glabrata NOT DETECTED NOT DETECTED Final   Candida krusei NOT DETECTED NOT DETECTED Final   Candida parapsilosis NOT DETECTED NOT DETECTED Final   Candida tropicalis NOT DETECTED NOT DETECTED Final    Comment: Performed at [facility], [address] [place], [place] [address]  Blood Culture (routine x 2)     Status: Abnormal (Preliminary result)   Collection Time: 11/13/17  9:29 PM  Result Value Ref Range Status   Specimen Description BLOOD BLOOD LEFT FOREARM  Final   Special  Requests   Final    BOTTLES DRAWN AEROBIC AND ANAEROBIC Blood Culture adequate volume   Culture  Setup Time   Final    GRAM POSITIVE COCCI IN PAIRS IN CHAINS IN BOTH AEROBIC AND ANAEROBIC BOTTLES CRITICAL RESULT CALLED TO, READ BACK BY AND VERIFIED WITH: Ailene Rud AT 1144 11/14/17 BY L BENFIELD Performed at [facility], [place] [address], [place], [place] [address]    Culture ENTEROCOCCUS FAECALIS (A)  Final   Report Status PENDING  Incomplete  Urine culture     Status: Abnormal   Collection Time: 11/14/17 12:57 AM  Result Value Ref Range Status   Specimen Description URINE, RANDOM  Final   Special Requests   Final    NONE Performed at [facility], [place] [address] [place], [place] [address]    Culture MULTIPLE SPECIES PRESENT, SUGGEST RECOLLECTION (A)  Final   Report Status 11/15/2017 FINAL  Final  MRSA PCR Screening     Status: None   Collection Time: 11/14/17  4:28 PM  Result Value Ref Range Status   MRSA by PCR NEGATIVE NEGATIVE Final    Comment:        The GeneXpert MRSA Assay (FDA approved for NASAL specimens only), is one component of a comprehensive MRSA colonization surveillance program. It is not intended to diagnose MRSA infection nor to guide or monitor treatment for MRSA  infections. Performed at [facility], [place] [address] [place], [place] [address]     Janene Madeira, MSN, NP-C [facility] for [facility] [place] Cell: [phone] Pager: [phone] 11/15/2017  1:55 PM

## 2017-11-15 NOTE — Progress Notes (Signed)
No charge note  PMT meeting today at 11:00 with wife.  Florentina Jenny, PA-C Palliative Medicine Pager: [phone]

## 2017-11-15 NOTE — NC FL2 (Signed)
[place] MEDICAID FL2 LEVEL OF CARE SCREENING TOOL     IDENTIFICATION  Patient Name: Raymond Swanson Birthdate: 10/31/1924 Sex: male Admission Date (Current Location): 11/13/2017  [facility] and [place] Number:  Herbalist and Address:  [facility] [place]. [facility], [place] [address] [place], [place] [address]      Provider Number: [id number]  Attending Physician Name and Address:  Rosita Fire, MD  Relative Name and Phone Number:       Current Level of Care: Hospital Recommended Level of Care: [place] Prior Approval Number:    Date Approved/Denied:   PASRR Number: [id number]  Discharge Plan: SNF    Current Diagnoses: Patient Active Problem List   Diagnosis Date Noted  . Pneumonia 11/14/2017  . Bacteremia 11/14/2017  . Elevated troponin   . AKI (acute kidney injury) ([place])   . Bacteremia due to Gram-negative bacteria   . Restless leg syndrome, controlled 04/27/2017  . Squamous cell carcinoma of multiple sites 08/25/2016  . Sleep disturbance 03/11/2016  . Traumatic intracranial subdural hematoma ([facility]) 01/28/2016  . Fall 01/27/2016  . Fracture of multiple ribs of left side 01/27/2016  . T8 vertebral fracture ([place]) 01/27/2016  . L1 vertebral fracture ([place]) 01/27/2016  . Lumbar transverse process fracture ([place]) 01/27/2016  . Pressure ulcer 01/26/2016  . Traumatic subdural hematoma with loss of consciousness of 30 minutes or less ([place]) 01/22/2016  . ANEMIA 10/08/2009  . ACUT GASTR ULCER W/HEMORR W/O MENTION OBST 09/10/2009  . MELENA 08/28/2009  . Hypothyroidism 10/18/2007  . Dyslipidemia 01/26/2007  . Essential hypertension 01/26/2007  . Coronary atherosclerosis 01/26/2007  . PROSTATE CANCER, HX OF 01/26/2007  . SKIN CANCER, HX OF 01/26/2007    Orientation RESPIRATION BLADDER Height & Weight     Self, Time, Situation, Place  O2(3L [place]) Incontinent Weight: 131 lb 13.4 oz (59.8 kg) Height:  5\' 10"  (177.8 cm)  BEHAVIORAL  SYMPTOMS/MOOD NEUROLOGICAL BOWEL NUTRITION STATUS      Continent Diet(cardiac)  AMBULATORY STATUS COMMUNICATION OF NEEDS Skin   Limited Assist Verbally Normal                       Personal Care Assistance Level of Assistance  Bathing, Dressing Bathing Assistance: Limited assistance   Dressing Assistance: Limited assistance     Functional Limitations Info             SPECIAL CARE FACTORS FREQUENCY  PT (By licensed PT), OT (By licensed OT)     PT Frequency: 5/wk OT Frequency: 5/wk            Contractures      Additional Factors Info  Code Status, Allergies Code Status Info: FULL Allergies Info: NKA           Current Medications (11/15/2017):  This is the current hospital active medication list Current Facility-Administered Medications  Medication Dose Route Frequency Provider Last Rate Last Dose  . Ampicillin-Sulbactam (UNASYN) 3 g in sodium chloride 0.9 % 100 mL IVPB  3 g Intravenous Q12H Michel Bickers, MD      . aspirin EC tablet 81 mg  81 mg Oral Daily Crosley, Debby, MD   81 mg at 11/15/17 1015  . carvedilol (COREG) tablet 3.125 mg  3.125 mg Oral BID WC Crosley, Debby, MD   3.125 mg at 11/15/17 1015  . famotidine (PEPCID) tablet 20 mg  20 mg Oral BID Claria Dice, Debby, MD   20 mg at 11/15/17 1014  .  furosemide (LASIX) tablet 40 mg  40 mg Oral Daily Rosita Fire, MD      . heparin injection 5,000 Units  5,000 Units Subcutaneous Q8H Quintella Baton, MD   5,000 Units at 11/15/17 0550  . hydrALAZINE (APRESOLINE) injection 10 mg  10 mg Intravenous Q4H PRN Rosita Fire, MD   10 mg at 11/14/17 1310  . HYDROcodone-acetaminophen (NORCO/VICODIN) 5-325 MG per tablet 1 tablet  1 tablet Oral Q4H PRN Crosley, Debby, MD      . ipratropium-albuterol (DUONEB) 0.5-2.5 (3) MG/3ML nebulizer solution 3 mL  3 mL Nebulization Q4H PRN Rosita Fire, MD   3 mL at 11/14/17 1303  . levothyroxine (SYNTHROID, LEVOTHROID) tablet 50 mcg  50 mcg Oral QAC breakfast  Quintella Baton, MD   50 mcg at 11/15/17 1015  . Melatonin TABS 3 mg  3 mg Oral QHS PRN Quintella Baton, MD   3 mg at 11/14/17 0452  . morphine 4 MG/ML injection 1 mg  1 mg Intravenous Q4H PRN Rosita Fire, MD      . nitroGLYCERIN (NITROSTAT) SL tablet 0.4 mg  0.4 mg Sublingual Q5 min PRN Crosley, Debby, MD      . simvastatin (ZOCOR) tablet 20 mg  20 mg Oral q1800 Crosley, Debby, MD   20 mg at 11/14/17 1809     Discharge Medications: Please see discharge summary for a list of discharge medications.  Relevant Imaging Results:  Relevant Lab Results:   Additional Information SS#: [id number]  Jorge Ny, LCSW

## 2017-11-15 NOTE — Evaluation (Signed)
Physical Therapy Evaluation Patient Details Name: Raymond Swanson MRN: [id number] DOB: 01-14-1925 Today's Date: 11/15/2017   History of Present Illness  Pt admit with weakness due to enterococcal bacteremia, but some respiratory distress from mild CHF. history of CAD, HTN, hx of Prostate CA, hematuria found to have papillary bladder tumer s/p cystoscopy, TURBT and R JJ stent placement on 2/20.  Clinical Impression  Pt admitted with above diagnosis. Pt currently with functional limitations due to the deficits listed below (see PT Problem List). Pt incontinent in bed and when standing therefore spent much of session cleaning up.  Pt was able to stand to RW with min guard assist to min assist for functional mobility.  Desat on RA therefore had to keep O2 on the entire treatment.   Pt will benefit from skilled PT to increase their independence and safety with mobility to allow discharge to the venue listed below.    Patient Saturations on Room Air at Rest = 85%  Patient Saturations on [employer] while Ambulating = N/A  Patient Saturations on 4 Liters of oxygen while Ambulating = 93%  Please briefly explain why patient needs home oxygen:Pt requires O2 at rest and with activity to keep sats >90%.      Follow Up Recommendations SNF;Supervision/Assistance - 24 hour(short term rehab may benefit pt)    Equipment Recommendations  None recommended by PT    Recommendations for Other Services       Precautions / Restrictions Precautions Precautions: Fall Restrictions Weight Bearing Restrictions: No      Mobility  Bed Mobility Overal bed mobility: Needs Assistance Bed Mobility: Supine to Sit     Supine to sit: Min guard     General bed mobility comments: incr time but no physical assist  Transfers Overall transfer level: Needs assistance Equipment used: Rolling walker (2 wheeled) Transfers: Sit to/from [facility] Sit to Stand: Min assist Stand pivot transfers: Min  assist       General transfer comment: Pt needs steadying assist to stand.  Once up, pt constantly urinating.  Had to clean pt as he kept urinating and he had had a BM as well therefore had to clean pt.  Pt can stand with RW with min guard asssit to be cleaned.  Desat without O2.  Able to take pivotal steps with fair safety with RW.   Ambulation/Gait             General Gait Details: Had to clean pt and he fatigued after standing to be cleaned.    Stairs            Wheelchair Mobility    Modified Rankin (Stroke Patients Only)       Balance Overall balance assessment: Needs assistance Sitting-balance support: No upper extremity supported;Feet supported Sitting balance-Leahy Scale: Fair     Standing balance support: Bilateral upper extremity supported;During functional activity Standing balance-Leahy Scale: Poor Standing balance comment: relies on UE support                             Pertinent Vitals/Pain Pain Assessment: No/denies pain    Home Living Family/patient expects to be discharged to:: Private residence Living Arrangements: Spouse/significant other Available Help at Discharge: Family;Available 24 hours/day Type of Home: House Home Access: Stairs to enter Entrance Stairs-Rails: Right Entrance Stairs-Number of Steps: 2 Home Layout: One level Home Equipment: Cane - single point;Walker - 2 wheels;Bedside commode  Prior Function Level of Independence: Independent with assistive device(s);Needs assistance   Gait / Transfers Assistance Needed: used cane PTA per pt  ADL's / Homemaking Assistance Needed: I per pt        Hand Dominance   Dominant Hand: Right    Extremity/Trunk Assessment   Upper Extremity Assessment Upper Extremity Assessment: Defer to OT evaluation    Lower Extremity Assessment Lower Extremity Assessment: Generalized weakness    Cervical / Trunk Assessment Cervical / Trunk Assessment: Kyphotic   Communication   Communication: HOH  Cognition Arousal/Alertness: Awake/alert Behavior During Therapy: WFL for tasks assessed/performed Overall Cognitive Status: Within Functional Limits for tasks assessed                                        General Comments      Exercises General Exercises - Lower Extremity Ankle Circles/Pumps: AROM;Both;10 reps;Supine Long Arc Quad: AROM;Both;10 reps;Seated   Assessment/Plan    PT Assessment Patient needs continued PT services  PT Problem List Decreased activity tolerance;Decreased mobility;Decreased balance;Decreased coordination;Decreased knowledge of use of DME;Decreased safety awareness;Decreased knowledge of precautions;Cardiopulmonary status limiting activity       PT Treatment Interventions DME instruction;Functional mobility training;Therapeutic activities;Therapeutic exercise;Balance training;Patient/family education;Stair training;Gait training    PT Goals (Current goals can be found in the Care Plan section)  Acute Rehab PT Goals Patient Stated Goal: to get better PT Goal Formulation: With patient Time For Goal Achievement: 11/29/17 Potential to Achieve Goals: Good    Frequency Min 3X/week   Barriers to discharge        Co-evaluation               AM-PAC PT "6 Clicks" Daily Activity  Outcome Measure Difficulty turning over in bed (including adjusting bedclothes, sheets and blankets)?: None Difficulty moving from lying on back to sitting on the side of the bed? : None Difficulty sitting down on and standing up from a chair with arms (e.g., wheelchair, bedside commode, etc,.)?: A Little Help needed moving to and from a bed to chair (including a wheelchair)?: A Little Help needed walking in hospital room?: Total Help needed climbing 3-5 steps with a railing? : Total 6 Click Score: 16    End of Session Equipment Utilized During Treatment: Gait belt;Oxygen Activity Tolerance: Patient limited by  fatigue Patient left: in chair;with call bell/phone within reach Nurse Communication: Mobility status PT Visit Diagnosis: Unsteadiness on feet (R26.81);Muscle weakness (generalized) (M62.81)    Time: 3557-3220 PT Time Calculation (min) (ACUTE ONLY): 27 min   Charges:   PT Evaluation $PT Eval Moderate Complexity: 1 Mod PT Treatments $Therapeutic Activity: 8-22 mins   PT G Codes:        Kyshawn Teal,PT Acute Rehabilitation [phone] (pager)   Denice Paradise 11/15/2017, 11:05 AM

## 2017-11-15 NOTE — Progress Notes (Signed)
No charge note.  Stopped by Agricultural consultant and patient's wife in the hallway.  He has become confused and keeps attempting to "go home".   Family requesting medication to calm him down.  "He's never like this at home".  Assessment:  Likely mild hospital delirium likely unveiling mild underlying dementia.  Recommendation:  Patient would be best served to return to his home environment as quickly as possible. Will order low dose haldol PRN and very low dose zyprexa QHS while in the hospital.  Florentina Jenny, PA-C Palliative Medicine Pager: [phone]

## 2017-11-15 NOTE — Progress Notes (Signed)
Patient has had multiple falls with previous rib fx. Placed on low bed with mat on floor next to bed. High fall risk bag placed in room.

## 2017-11-16 ENCOUNTER — Inpatient Hospital Stay (HOSPITAL_COMMUNITY): Payer: Medicare Other

## 2017-11-16 DIAGNOSIS — I350 Nonrheumatic aortic (valve) stenosis: Secondary | ICD-10-CM

## 2017-11-16 DIAGNOSIS — J9811 Atelectasis: Secondary | ICD-10-CM

## 2017-11-16 DIAGNOSIS — R509 Fever, unspecified: Secondary | ICD-10-CM

## 2017-11-16 LAB — CULTURE, BLOOD (ROUTINE X 2)
SPECIAL REQUESTS: ADEQUATE
SPECIAL REQUESTS: ADEQUATE

## 2017-11-16 LAB — CBC
HCT: 33.1 % — ABNORMAL LOW (ref 39.0–52.0)
HEMOGLOBIN: 10 g/dL — AB (ref 13.0–17.0)
MCH: 25.8 pg — ABNORMAL LOW (ref 26.0–34.0)
MCHC: 30.2 g/dL (ref 30.0–36.0)
MCV: 85.3 fL (ref 78.0–100.0)
PLATELETS: 170 10*3/uL (ref 150–400)
RBC: 3.88 MIL/uL — AB (ref 4.22–5.81)
RDW: 14.9 % (ref 11.5–15.5)
WBC: 13.2 10*3/uL — AB (ref 4.0–10.5)

## 2017-11-16 LAB — BASIC METABOLIC PANEL
ANION GAP: 13 (ref 5–15)
BUN: 53 mg/dL — ABNORMAL HIGH (ref 6–20)
CHLORIDE: 102 mmol/L (ref 101–111)
CO2: 28 mmol/L (ref 22–32)
Calcium: 8.1 mg/dL — ABNORMAL LOW (ref 8.9–10.3)
Creatinine, Ser: 1.86 mg/dL — ABNORMAL HIGH (ref 0.61–1.24)
GFR, EST AFRICAN AMERICAN: 35 mL/min — AB (ref >60)
GFR, EST NON AFRICAN AMERICAN: 30 mL/min — AB (ref >60)
Glucose, Bld: 135 mg/dL — ABNORMAL HIGH (ref 65–99)
POTASSIUM: 3.2 mmol/L — AB (ref 3.5–5.1)
SODIUM: 143 mmol/L (ref 135–145)

## 2017-11-16 LAB — ECHOCARDIOGRAM COMPLETE
Height: 70 in
Weight: 2109.3613 oz

## 2017-11-16 LAB — MAGNESIUM: MAGNESIUM: 2.2 mg/dL (ref 1.7–2.4)

## 2017-11-16 MED ORDER — POTASSIUM CHLORIDE 20 MEQ/15ML (10%) PO SOLN
40.0000 meq | Freq: Once | ORAL | Status: AC
  Administered 2017-11-16: 40 meq via ORAL
  Filled 2017-11-16: qty 30

## 2017-11-16 MED ORDER — POTASSIUM CHLORIDE CRYS ER 20 MEQ PO TBCR: 40.0000 meq | EXTENDED_RELEASE_TABLET | Freq: Once | ORAL | Status: DC

## 2017-11-16 NOTE — Progress Notes (Addendum)
PROGRESS NOTE    Raymond Swanson  WLN:[id number] DOB: 01/09/1925 DOA: 11/13/2017 PCP: Marletta Lor, MD   Brief Narrative: 84 year old male with history of hypertension, hypothyroidism, hyperlipidemia, presented to the ER for generalized weakness, fall, cough and temperature of 101.7.  Patient was found to have mild elevation in troponin and admitted for respiratory distress:  Assessment & Plan:   #Acute respiratory distress, acute respiratory failure with hypoxia: Chest x-ray with atelectasis and cardiomegaly.  Patient has elevated BNP level.  Initially treated with IV Lasix and currently on oral.  Mild elevation in serum creatinine level noticed today.  His shortness of breath and cough significantly improved.  Continue bronchodilators. - Follow-up echocardiogram.  #Enterococcal bacteremia: Source unknown.  Continue Unasyn.  Follow-up repeat culture and echocardiogram.  ID consult appreciated.  #Acute kidney injury on chronic kidney disease stage III: Monitor BMP.  Mild elevation in serum creatinine level noticed.  Monitor BMP and urine output.  On oral Lasix.   Urine contaminant.  #Hypertensive urgency on admission: Blood pressure better controlled.  On Coreg and oral Lasix.  Monitor blood pressure.Marland Kitchen  #Generalized weakness/fall: PT OT evaluation.  Repeat PT OT evaluation since patient is clinically much better today.  Social worker is following as well.  #Hypothyroidism: TSH level acceptable..  Continue Synthroid.  #Hypokalemia: Replete potassium chloride.  Monitor labs.  #GOC: Palliative care meeting with the family.  Patient is DNR.  DVT prophylaxis: Heparin subcu Code Status: DNR  Family Communication: No family at bedside. Disposition Plan: PT OT evaluation.  Home with home care versus SNF.    Consultants:   Palliative care  Infectious disease  Procedures: Echo Antimicrobials: Ampicillin  Subjective: Seen and examined at bedside.  He looks much better today.   Denies headache, dizziness, nausea vomiting chest pain shortness of breath.  Objective: Vitals:   11/16/17 0000 11/16/17 0440 11/16/17 0808 11/16/17 1149  BP: (!) 94/55 (!) 143/57 140/61 (!) 126/53  Pulse:    80  Resp: 15 16 18 17   Temp:  97.6 F (36.4 C) 98 F (36.7 C) 97.9 F (36.6 C)  TempSrc:  Oral Oral Oral  SpO2: 96% 100% 98% 99%  Weight:      Height:        Intake/Output Summary (Last 24 hours) at 11/16/2017 1217 Last data filed at 11/16/2017 0700 Gross per 24 hour  Intake 200 ml  Output 500 ml  Net -300 ml   Filed Weights   11/14/17 1644  Weight: 59.8 kg (131 lb 13.4 oz)    Examination:  General exam: Not in distress, looks comfortable Respiratory system: Bibasilar mild crackles otherwise improvement in lung exam, respiratory effort normal. Cardiovascular system: Regular rate rhythm S1-S2 normal.  No pedal edema. Gastrointestinal system: Abdomen is nondistended, soft and nontender. Normal bowel sounds heard. Central nervous system: Alert awake and following commands Extremities: Symmetric 5 x 5 power. Skin: No rashes, lesions or ulcers     Data Reviewed: I have personally reviewed following labs and imaging studies  CBC: Recent Labs  Lab 11/13/17 2119 11/14/17 0418 11/14/17 1508 11/15/17 0134 11/16/17 0227  WBC 9.3 7.8 11.9* 8.8 13.2*  NEUTROABS 8.2*  --   --   --   --   HGB 9.7* 8.8* 10.8* 10.1* 10.0*  HCT 32.0* 28.6* 35.7* 33.2* 33.1*  MCV 86.3 87.2 89.5 86.9 85.3  PLT 167 133* 175 148* 740   Basic Metabolic Panel: Recent Labs  Lab 11/13/17 2119 11/14/17 0418 11/14/17 1508 11/15/17 0134 11/16/17  0227  NA 140 143 140 143 143  K 4.0 3.9 4.7 3.5 3.2*  CL 107 110 109 105 102  CO2 23 21* 20* 27 28  GLUCOSE 178* 136* 227* 144* 135*  BUN 28* 27* 32* 37* 53*  CREATININE 1.78* 1.75* 1.69* 1.74* 1.86*  CALCIUM 8.5* 7.7* 8.0* 8.0* 8.1*  MG  --   --   --   --  2.2   GFR: Estimated Creatinine Clearance: 21.9 mL/min (A) (by C-G formula based on  SCr of 1.86 mg/dL (H)). Liver Function Tests: Recent Labs  Lab 11/13/17 2119  AST 39  ALT 17  ALKPHOS 80  BILITOT 0.6  PROT 6.4*  ALBUMIN 3.2*   No results for input(s): LIPASE, AMYLASE in the last 168 hours. No results for input(s): AMMONIA in the last 168 hours. Coagulation Profile: No results for input(s): INR, PROTIME in the last 168 hours. Cardiac Enzymes: Recent Labs  Lab 11/13/17 2119 11/14/17 0418 11/14/17 0935  TROPONINI 0.21* 0.27* 0.20*   BNP (last 3 results) No results for input(s): PROBNP in the last 8760 hours. HbA1C: No results for input(s): HGBA1C in the last 72 hours. CBG: No results for input(s): GLUCAP in the last 168 hours. Lipid Profile: Recent Labs    11/14/17 0418  CHOL 94  HDL 43  LDLCALC 42  TRIG 43  CHOLHDL 2.2   Thyroid Function Tests: Recent Labs    11/15/17 1406  TSH 3.979   Anemia Panel: No results for input(s): VITAMINB12, FOLATE, FERRITIN, TIBC, IRON, RETICCTPCT in the last 72 hours. Sepsis Labs: Recent Labs  Lab 11/13/17 2141 11/14/17 0055 11/14/17 1508  LATICACIDVEN 1.09 0.54 1.3    Recent Results (from the past 240 hour(s))  Blood Culture (routine x 2)     Status: Abnormal   Collection Time: 11/13/17  9:20 PM  Result Value Ref Range Status   Specimen Description BLOOD RIGHT FOREARM  Final   Special Requests   Final    BOTTLES DRAWN AEROBIC AND ANAEROBIC Blood Culture adequate volume   Culture  Setup Time   Final    GRAM POSITIVE COCCI IN PAIRS IN CHAINS IN BOTH AEROBIC AND ANAEROBIC BOTTLES CRITICAL RESULT CALLED TO, READ BACK BY AND VERIFIED WITH: Ailene Rud AT 1144 11/14/17 BY L BENFIELD Performed at [facility], [place] [address], [place], [place] [address]    Culture ENTEROCOCCUS FAECALIS (A)  Final   Report Status 11/16/2017 FINAL  Final   Organism ID, Bacteria ENTEROCOCCUS FAECALIS  Final      Susceptibility   Enterococcus faecalis - MIC*    AMPICILLIN <=2 SENSITIVE Sensitive     VANCOMYCIN 2  SENSITIVE Sensitive     GENTAMICIN SYNERGY SENSITIVE Sensitive     * ENTEROCOCCUS FAECALIS  Blood Culture ID Panel (Reflexed)     Status: Abnormal   Collection Time: 11/13/17  9:20 PM  Result Value Ref Range Status   Enterococcus species DETECTED (A) NOT DETECTED Final    Comment: CRITICAL RESULT CALLED TO, READ BACK BY AND VERIFIED WITH: M MACCIA,PHARMD AT 1144 11/14/17 BY L BENFIELD    Vancomycin resistance NOT DETECTED NOT DETECTED Final   Listeria monocytogenes NOT DETECTED NOT DETECTED Final   Staphylococcus species NOT DETECTED NOT DETECTED Final   Staphylococcus aureus NOT DETECTED NOT DETECTED Final   Streptococcus species NOT DETECTED NOT DETECTED Final   Streptococcus agalactiae NOT DETECTED NOT DETECTED Final   Streptococcus pneumoniae NOT DETECTED NOT DETECTED Final   Streptococcus pyogenes NOT  DETECTED NOT DETECTED Final   Acinetobacter baumannii NOT DETECTED NOT DETECTED Final   Enterobacteriaceae species NOT DETECTED NOT DETECTED Final   Enterobacter cloacae complex NOT DETECTED NOT DETECTED Final   Escherichia coli NOT DETECTED NOT DETECTED Final   Klebsiella oxytoca NOT DETECTED NOT DETECTED Final   Klebsiella pneumoniae NOT DETECTED NOT DETECTED Final   Proteus species NOT DETECTED NOT DETECTED Final   Serratia marcescens NOT DETECTED NOT DETECTED Final   Haemophilus influenzae NOT DETECTED NOT DETECTED Final   Neisseria meningitidis NOT DETECTED NOT DETECTED Final   Pseudomonas aeruginosa NOT DETECTED NOT DETECTED Final   Candida albicans NOT DETECTED NOT DETECTED Final   Candida glabrata NOT DETECTED NOT DETECTED Final   Candida krusei NOT DETECTED NOT DETECTED Final   Candida parapsilosis NOT DETECTED NOT DETECTED Final   Candida tropicalis NOT DETECTED NOT DETECTED Final    Comment: Performed at [facility], [place] [address], [place], [place] [address]  Blood Culture (routine x 2)     Status: Abnormal   Collection Time: 11/13/17  9:29 PM  Result Value  Ref Range Status   Specimen Description BLOOD BLOOD LEFT FOREARM  Final   Special Requests   Final    BOTTLES DRAWN AEROBIC AND ANAEROBIC Blood Culture adequate volume   Culture  Setup Time   Final    GRAM POSITIVE COCCI IN PAIRS IN CHAINS IN BOTH AEROBIC AND ANAEROBIC BOTTLES CRITICAL RESULT CALLED TO, READ BACK BY AND VERIFIED WITH: M MACCIA,PHARMD AT 0867 11/14/17 BY L BENFIELD    Culture (A)  Final    ENTEROCOCCUS FAECALIS SUSCEPTIBILITIES PERFORMED ON PREVIOUS CULTURE WITHIN THE LAST 5 DAYS. Performed at [facility], [place] [address] [place], [place] [address]    Report Status 11/16/2017 FINAL  Final  Urine culture     Status: Abnormal   Collection Time: 11/14/17 12:57 AM  Result Value Ref Range Status   Specimen Description URINE, RANDOM  Final   Special Requests   Final    NONE Performed at [facility], [place] [address] [place], [place] [address]    Culture MULTIPLE SPECIES PRESENT, SUGGEST RECOLLECTION (A)  Final   Report Status 11/15/2017 FINAL  Final  MRSA PCR Screening     Status: None   Collection Time: 11/14/17  4:28 PM  Result Value Ref Range Status   MRSA by PCR NEGATIVE NEGATIVE Final    Comment:        The GeneXpert MRSA Assay (FDA approved for NASAL specimens only), is one component of a comprehensive MRSA colonization surveillance program. It is not intended to diagnose MRSA infection nor to guide or monitor treatment for MRSA infections. Performed at [facility], [place] [address], [place], [place] [address]   Culture, blood (routine x 2)     Status: None (Preliminary result)   Collection Time: 11/15/17 10:28 AM  Result Value Ref Range Status   Specimen Description BLOOD LEFT HAND  Final   Special Requests   Final    BOTTLES DRAWN AEROBIC ONLY Blood Culture results may not be optimal due to an inadequate volume of blood received in culture bottles   Culture   Final    NO GROWTH < 24 HOURS Performed at [facility], [place]  [address], [place],  [address]    Report Status PENDING  Incomplete  Culture, blood (routine x 2)     Status: None (Preliminary result)   Collection Time: 11/15/17  10:33 AM  Result Value Ref Range Status   Specimen Description BLOOD LEFT HAND  Final   Special Requests   Final    BOTTLES DRAWN AEROBIC ONLY Blood Culture results may not be optimal due to an inadequate volume of blood received in culture bottles   Culture   Final    NO GROWTH < 24 HOURS Performed at [place] [address] [place], [place] [address]    Report Status PENDING  Incomplete         Radiology Studies: Dg Chest Port 1 View  Result Date: 11/16/2017 CLINICAL DATA:  Shortness of Breath EXAM: PORTABLE CHEST 1 VIEW COMPARISON:  11/14/2017 FINDINGS: Prior CABG. Heart is upper limits normal in size. Bibasilar atelectasis. No visible effusions or acute bony abnormality. IMPRESSION: Bibasilar atelectasis. Electronically Signed   By: Rolm Baptise M.D.   On: 11/16/2017 08:58   Dg Chest Port 1 View  Result Date: 11/14/2017 CLINICAL DATA:  Shortness of breath after fall. EXAM: PORTABLE CHEST 1 VIEW COMPARISON:  11/13/2017 FINDINGS: Sternotomy wires unchanged. Lungs are adequately inflated with minimal linear density in the left base unchanged likely atelectasis. No definite effusion. No pneumothorax. Mild stable cardiomegaly. Calcified plaque over the aortic arch. Old left posterior rib fractures. Degenerative change of the spine. IMPRESSION: Minimal stable linear atelectasis left base. Stable mild cardiomegaly. Electronically Signed   By: Marin Olp M.D.   On: 11/14/2017 14:43        Scheduled Meds: . aspirin EC  81 mg Oral Daily  . carvedilol  3.125 mg Oral BID WC  . famotidine  20 mg Oral QHS  . furosemide  40 mg Oral Daily  . heparin  5,000 Units Subcutaneous Q8H  . levothyroxine  50 mcg Oral QAC breakfast  . OLANZapine zydis  2.5 mg Oral QHS  . simvastatin  20 mg Oral q1800   Continuous  Infusions: . ampicillin-sulbactam (UNASYN) IV Stopped (11/16/17 0109)     LOS: 2 days    Dron Tanna Furry, MD [facility] Pager [phone]  If 7PM-7AM, please contact night-coverage [email or web address] Password [facility] 11/16/2017, 12:17 PM

## 2017-11-16 NOTE — Progress Notes (Signed)
  Echocardiogram 2D Echocardiogram has been performed.  Darlina Sicilian M 11/16/2017, 9:54 AM

## 2017-11-16 NOTE — Progress Notes (Signed)
[facility] for Infectious Disease  Date of Admission:  11/13/2017      Total days of antibiotics 3  Day 1 unasyn           ASSESSMENT: 84 y.o. male with enterococcal bacteremia that is pan-sensitive. CXR with bilateral atelectasis. Presumed source to be urinary considering removal of bladder tumor 4 weeks prior, although this seems to be a long time   PLAN: 1. Awaiting TTE report to determine plan for treatment  2. Would recommend physical therapy to assess and ambulate patient.  3. Would recommend titrating down oxygen to determine long term need.   I discussed treatment options including PICC line vs oral therapy - explained we are still gathering information as to options but it seems as if home therapy with IV would not be doable for he and his wife as they have no on other than themselves that live close by to help. He had relatively quick clearance of blood cultures which is encouraging; TTE report is still pending but he has had a known murmur for several years.   Principal Problem:   Enterococcal bacteremia Active Problems:   Pneumonia   Malignant neoplasm of posterior wall of urinary bladder (HCC)   Hypothyroidism   Dyslipidemia   Essential hypertension   Coronary atherosclerosis   PROSTATE CANCER, HX OF   Fall   Acute bronchitis   Palliative care encounter   . aspirin EC  81 mg Oral Daily  . carvedilol  3.125 mg Oral BID WC  . famotidine  20 mg Oral QHS  . furosemide  40 mg Oral Daily  . heparin  5,000 Units Subcutaneous Q8H  . levothyroxine  50 mcg Oral QAC breakfast  . OLANZapine zydis  2.5 mg Oral QHS  . simvastatin  20 mg Oral q1800    SUBJECTIVE: Continues to feel better today. Would like to get up and walk around and eat some lunch. He would like to know when he can leave and go home. Continues on oxygen via nasal cannula.   Review of Systems  Constitutional: Negative for chills and fever.  HENT: Negative for tinnitus.   Eyes:  Negative for blurred vision and photophobia.  Respiratory: Positive for cough and shortness of breath. Negative for sputum production.   Cardiovascular: Negative for chest pain.  Gastrointestinal: Negative for diarrhea, nausea and vomiting.  Genitourinary: Negative for dysuria, flank pain, frequency and hematuria.  Musculoskeletal:       Feels weak   Skin: Negative for rash.  Neurological: Negative for headaches.   No Known Allergies  OBJECTIVE: Vitals:   11/16/17 0000 11/16/17 0440 11/16/17 0808 11/16/17 1149  BP: (!) 94/55 (!) 143/57 140/61 (!) 126/53  Pulse:    80  Resp: 15 16 18 17   Temp:  97.6 F (36.4 C) 98 F (36.7 C) 97.9 F (36.6 C)  TempSrc:  Oral Oral Oral  SpO2: 96% 100% 98% 99%  Weight:      Height:       Body mass index is 18.92 kg/m.  Physical Exam  Constitutional: He is oriented to person, place, and time and well-developed, well-nourished, and in no distress.  HENT:  Mouth/Throat: Oropharynx is clear and moist. No oral lesions. Normal dentition. No dental caries.  Eyes: No scleral icterus.  Cardiovascular: Normal rate and regular rhythm.  Murmur (2/6 systolic murmur right sternal boarder ) heard. Pulmonary/Chest: Effort normal. No respiratory distress.  Faint wheezing, shallow inspiratory effort.  Abdominal: Soft. He exhibits no distension. There is no tenderness.  Musculoskeletal: Normal range of motion. He exhibits no edema.  Lymphadenopathy:    He has no cervical adenopathy.  Neurological: He is alert and oriented to person, place, and time.  Skin: Skin is warm and dry. No rash noted.  Psychiatric: Mood and affect normal.   Lab Results Lab Results  Component Value Date   WBC 13.2 (H) 11/16/2017   HGB 10.0 (L) 11/16/2017   HCT 33.1 (L) 11/16/2017   MCV 85.3 11/16/2017   PLT 170 11/16/2017    Lab Results  Component Value Date   CREATININE 1.86 (H) 11/16/2017   BUN 53 (H) 11/16/2017   NA 143 11/16/2017   K 3.2 (L) 11/16/2017   CL 102  11/16/2017   CO2 28 11/16/2017    Lab Results  Component Value Date   ALT 17 11/13/2017   AST 39 11/13/2017   ALKPHOS 80 11/13/2017   BILITOT 0.6 11/13/2017     Microbiology: BCx 3/23 >> enterococcus faecalis 4 bottles  BCx 3/25 >> no growth 24h  Janene Madeira, MSN, NP-C [facility] for Infectious [place] Cell: [phone] Pager: [phone] 11/16/2017  12:37 PM

## 2017-11-16 NOTE — Progress Notes (Signed)
Daily Progress Note   Patient Name: Raymond Swanson       Date: 11/16/2017 DOB: [birth year]  Age: 84 y.o. MRN#: [id number] Attending Physician: Rosita Fire, MD Primary Care Physician: Marletta Lor, MD Admit Date: 11/13/2017  Reason for Consultation/Follow-up: Establishing goals of care  Subjective: Spoke with patient at bedside.  Unfortunately his wife and son did not make our 11:00 appointment.    Patient is very clear in his goals.  I want to be able to walk, I want to be making my own decisions, I do not want to be a burden on my wife.  Eating and enjoying his food is very important to him.  He will be hesitant of any treatment that effects his sense of taste.  With regard to his wife, he states that she is 40 and he has always taken care of her.  He feels she is anxious now at the prospect of having to take care of him.  He refuses to be a burden to her.    He is very willing to attend short term rehab in an attempt to regain strength but never wants to live in a nursing home longer term.  He states that he plans to speak with Dr. Lovena Neighbours about options for his bladder cancer (which may include surgery, medication, or nothing).  What ever treatment is proposed he will carefully weigh its impact on his quality of life and as well as his family's quality of life before deciding to proceed with it.  He greatly trusts the medical providers to council him well.  We briefly discussed Hospice as being an option down the road once he is finished with rehab.   He is open to Hospice if his medical providers (Drs. [place] and [place]) recommend it after rehab.  The patient told me a story (2 times) about his elevated PSA.  Dr. Gaynelle Arabian offered him a medication that would bring it down.  The  patient and Dr. Gaynelle Arabian opted against treatment because it would make him feel so poorly.  QUALITY of life is his priority.  Assessment: 84 yo male with bacteremia and an acutely decreased respiratory status.   2D echo has not resulted yet.  Patient with bladder tumor recently removed and long standing elevated PSA (per the patient it is 75).  He had some difficulty with agitation yesterday - wanting to leave the hospital.  He appears more calm today on very low dose Zyprexa.   Patient Profile/HPI:  84 y.o. male  with past medical history of prostate CA, bladder cancer (10/15/2017) s/p TURBT and right JJ stent placed, who was admitted on 11/13/2017 with a fall and malaise.  Work up revealed enterococcal bacteremia.  ID is consulted. He developed respiratory distress on 3/24.  Length of Stay: 2  Current Medications: Scheduled Meds:  . aspirin EC  81 mg Oral Daily  . carvedilol  3.125 mg Oral BID WC  . famotidine  20 mg Oral QHS  . furosemide  40 mg Oral Daily  . heparin  5,000 Units Subcutaneous Q8H  . levothyroxine  50 mcg Oral QAC breakfast  . OLANZapine zydis  2.5 mg Oral QHS  . simvastatin  20 mg Oral q1800    Continuous Infusions: . ampicillin-sulbactam (UNASYN) IV Stopped (11/16/17 0109)    PRN Meds: haloperidol lactate, hydrALAZINE, HYDROcodone-acetaminophen, ipratropium-albuterol, Melatonin, morphine injection, nitroGLYCERIN  Physical Exam        Elderly male, Awake, Alert, Coherent, sitting on the side of the bed  Very hard of hearing, but appropriate. On N/C 3 liters and talking.  No distress.    Vital Signs: BP (!) 126/53   Pulse 80   Temp 97.9 F (36.6 C) (Oral)   Resp 17   Ht 5\' 10"  (1.778 m)   Wt 59.8 kg (131 lb 13.4 oz)   SpO2 99%   BMI 18.92 kg/m  SpO2: SpO2: 99 % O2 Device: O2 Device: Nasal Cannula O2 Flow Rate: O2 Flow Rate (L/min): 3 L/min  Intake/output summary:   Intake/Output Summary (Last 24 hours) at 11/16/2017 1249 Last data filed at  11/16/2017 1239 Gross per 24 hour  Intake 200 ml  Output 675 ml  Net -475 ml   LBM: Last BM Date: 11/15/17 Baseline Weight: Weight: 59.8 kg (131 lb 13.4 oz) Most recent weight: Weight: 59.8 kg (131 lb 13.4 oz)       Palliative Assessment/Data: 50%   Flowsheet Rows     Most Recent Value  Intake Tab  Referral Department  Hospitalist  Unit at Time of Referral  Med/Surg Unit  Palliative Care Primary Diagnosis  Cancer  Date Notified  11/14/17  Palliative Care Type  New Palliative care  Reason for referral  Clarify Goals of Care  Date of Admission  11/13/17  Date first seen by Palliative Care  11/15/17  # of days Palliative referral response time  1 Day(s)  # of days IP prior to Palliative referral  1  Clinical Assessment  Psychosocial & Spiritual Assessment  Palliative Care Outcomes      Patient Active Problem List   Diagnosis Date Noted  . Acute bronchitis   . Malignant neoplasm of posterior wall of urinary bladder ([place])   . Palliative care encounter   . Pneumonia 11/14/2017  . Enterococcal bacteremia 11/14/2017  . Elevated troponin   . AKI (acute kidney injury) ([place])   . Bacteremia due to Gram-negative bacteria   . Restless leg syndrome, controlled 04/27/2017  . Squamous cell carcinoma of multiple sites 08/25/2016  . Sleep disturbance 03/11/2016  . Traumatic intracranial subdural hematoma ([facility]) 01/28/2016  . Fall 01/27/2016  . Fracture of multiple ribs of left side 01/27/2016  . T8 vertebral fracture ([place]) 01/27/2016  . L1 vertebral fracture ([place]) 01/27/2016  . Lumbar transverse process fracture ([place]) 01/27/2016  . Pressure ulcer  01/26/2016  . Traumatic subdural hematoma with loss of consciousness of 30 minutes or less ([place]) 01/22/2016  . ANEMIA 10/08/2009  . ACUT GASTR ULCER W/HEMORR W/O MENTION OBST 09/10/2009  . MELENA 08/28/2009  . Hypothyroidism 10/18/2007  . Dyslipidemia 01/26/2007  . Essential hypertension 01/26/2007  . Coronary atherosclerosis  01/26/2007  . PROSTATE CANCER, HX OF 01/26/2007  . SKIN CANCER, HX OF 01/26/2007    Palliative Care Plan    Recommendations/Plan:  DNR  Continue current treatment.  Patient's priority is Quality of life over Quantity of life.  He wants to be able to remain independent as long as possible.  If a treatment is going to cause him to be a burden to his wife he does not want to pursue it.  Palliative Team will follow at a distance.  Please call us if we can be of assistance.  Goals of Care and Additional Recommendations:  Limitations on Scope of Treatment: Full Scope Treatment  Code Status:  DNR  Prognosis:   Unable to determine   Discharge Planning:  [facility] for rehab with Palliative care service follow-up  Care plan was discussed with patient  Thank you for allowing the Palliative Medicine Team to assist in the care of this patient.  Total time spent:  50 min.     Greater than 50%  of this time was spent counseling and coordinating care related to the above assessment and plan.  Florentina Jenny, PA-C Palliative Medicine  Please contact Palliative MedicineTeam phone at [phone] for questions and concerns between 7 am - 7 pm.   Please see AMION for individual provider pager numbers.

## 2017-11-16 NOTE — Care Management Note (Signed)
Case Management Note  Patient Details  Name: Raymond Swanson MRN: [id number] Date of Birth: 04/17/1925  Subjective/Objective:            Spoke w patiet and son at bedside. Patient states he lives at home with wife. He is very independent for his age. He still drives, states he has a RW but only uses when he is outside, otherwise he may use a cane in the home. He has a son who is not local and travels a lot, is currently in town until tomorrow. Son is supportive of patient going to [facility] SN[facility] prior to returning home, CSW following. SonRichardson Landry [phone] Niece Minette Brine [phone]   (contact in town, per Richardson Landry is EMS and is helpful)     Action/Plan:   Expected Discharge Date:                  Expected Discharge Plan:     In-House Referral:     Discharge planning Services  CM Consult  Post Acute Care Choice:    Choice offered to:     DME Arranged:    DME Agency:     HH Arranged:    [place] Agency:     Status of Service:  In process, will continue to follow  If discussed at Long Length of Stay Meetings, dates discussed:    Additional Comments:  Carles Collet, RN 11/16/2017, 2:21 PM

## 2017-11-17 ENCOUNTER — Inpatient Hospital Stay (HOSPITAL_COMMUNITY): Payer: Medicare Other

## 2017-11-17 ENCOUNTER — Encounter (HOSPITAL_COMMUNITY): Payer: Self-pay

## 2017-11-17 ENCOUNTER — Encounter (HOSPITAL_COMMUNITY)
Admission: EM | Disposition: A | Discharge status: Skilled Nursing Facility | Payer: Self-pay | Source: Home / Self Care | Attending: Nephrology

## 2017-11-17 DIAGNOSIS — R32 Unspecified urinary incontinence: Secondary | ICD-10-CM

## 2017-11-17 DIAGNOSIS — I700 Atherosclerosis of aorta: Secondary | ICD-10-CM

## 2017-11-17 DIAGNOSIS — I352 Nonrheumatic aortic (valve) stenosis with insufficiency: Secondary | ICD-10-CM

## 2017-11-17 DIAGNOSIS — I350 Nonrheumatic aortic (valve) stenosis: Secondary | ICD-10-CM

## 2017-11-17 DIAGNOSIS — R05 Cough: Secondary | ICD-10-CM

## 2017-11-17 DIAGNOSIS — I351 Nonrheumatic aortic (valve) insufficiency: Secondary | ICD-10-CM

## 2017-11-17 HISTORY — PX: TEE WITHOUT CARDIOVERSION: SHX5443

## 2017-11-17 LAB — CBC
HEMATOCRIT: 33.5 % — AB (ref 39.0–52.0)
Hemoglobin: 10 g/dL — ABNORMAL LOW (ref 13.0–17.0)
MCH: 25.8 pg — AB (ref 26.0–34.0)
MCHC: 29.9 g/dL — ABNORMAL LOW (ref 30.0–36.0)
MCV: 86.3 fL (ref 78.0–100.0)
PLATELETS: 174 10*3/uL (ref 150–400)
RBC: 3.88 MIL/uL — ABNORMAL LOW (ref 4.22–5.81)
RDW: 14.8 % (ref 11.5–15.5)
WBC: 11 10*3/uL — ABNORMAL HIGH (ref 4.0–10.5)

## 2017-11-17 LAB — BASIC METABOLIC PANEL
ANION GAP: 13 (ref 5–15)
BUN: 54 mg/dL — AB (ref 6–20)
CO2: 26 mmol/L (ref 22–32)
Calcium: 8.1 mg/dL — ABNORMAL LOW (ref 8.9–10.3)
Chloride: 107 mmol/L (ref 101–111)
Creatinine, Ser: 1.63 mg/dL — ABNORMAL HIGH (ref 0.61–1.24)
GFR calc Af Amer: 41 mL/min — ABNORMAL LOW (ref >60)
GFR calc non Af Amer: 35 mL/min — ABNORMAL LOW (ref >60)
Glucose, Bld: 99 mg/dL (ref 65–99)
POTASSIUM: 3.4 mmol/L — AB (ref 3.5–5.1)
SODIUM: 146 mmol/L — AB (ref 135–145)

## 2017-11-17 SURGERY — ECHOCARDIOGRAM, TRANSESOPHAGEAL
Anesthesia: Moderate Sedation

## 2017-11-17 MED ORDER — SODIUM CHLORIDE 0.9 % IV SOLN
INTRAVENOUS | Status: DC
  Administered 2017-11-17: 13:00:00 via INTRAVENOUS

## 2017-11-17 MED ORDER — POTASSIUM CHLORIDE 20 MEQ/15ML (10%) PO SOLN
40.0000 meq | Freq: Two times a day (BID) | ORAL | Status: DC
  Administered 2017-11-17 – 2017-11-18 (×3): 40 meq via ORAL
  Filled 2017-11-17 (×4): qty 30

## 2017-11-17 MED ORDER — MIDAZOLAM HCL 5 MG/ML IJ SOLN
INTRAMUSCULAR | Status: AC
  Filled 2017-11-17: qty 2

## 2017-11-17 MED ORDER — FENTANYL CITRATE (PF) 100 MCG/2ML IJ SOLN
INTRAMUSCULAR | Status: DC | PRN
  Administered 2017-11-17: 25 ug via INTRAVENOUS

## 2017-11-17 MED ORDER — POTASSIUM CHLORIDE CRYS ER 20 MEQ PO TBCR: 40.0000 meq | EXTENDED_RELEASE_TABLET | Freq: Once | ORAL | Status: DC

## 2017-11-17 MED ORDER — FENTANYL CITRATE (PF) 100 MCG/2ML IJ SOLN
INTRAMUSCULAR | Status: AC
  Filled 2017-11-17: qty 2

## 2017-11-17 MED ORDER — MIDAZOLAM HCL 10 MG/2ML IJ SOLN
INTRAMUSCULAR | Status: DC | PRN
  Administered 2017-11-17 (×2): 1 mg via INTRAVENOUS

## 2017-11-17 MED ORDER — AMOXICILLIN 500 MG PO CAPS
500.0000 mg | ORAL_CAPSULE | Freq: Two times a day (BID) | ORAL | Status: DC
  Administered 2017-11-17 – 2017-11-18 (×2): 500 mg via ORAL
  Filled 2017-11-17 (×2): qty 1

## 2017-11-17 MED ORDER — POTASSIUM CHLORIDE 20 MEQ/15ML (10%) PO SOLN
40.0000 meq | Freq: Once | ORAL | Status: DC
  Filled 2017-11-17: qty 30

## 2017-11-17 MED ORDER — AMOXICILLIN 500 MG PO CAPS: 500.0000 mg | ORAL_CAPSULE | Freq: Two times a day (BID) | ORAL | Status: DC

## 2017-11-17 MED ORDER — BUTAMBEN-TETRACAINE-BENZOCAINE 2-2-14 % EX AERO
INHALATION_SPRAY | CUTANEOUS | Status: DC | PRN
  Administered 2017-11-17: 2 via TOPICAL

## 2017-11-17 NOTE — CV Procedure (Signed)
INDICATIONS:   The patient is 84 year old with enterococcal bacteremia.  PROCEDURE:  Informed consent was discussed including risks, benefits and alternatives for the procedure.  Risks include, but are not limited to, cough, sore throat, vomiting, nausea, somnolence, esophageal and stomach trauma or perforation, bleeding, low blood pressure, aspiration, pneumonia, infection, trauma to the teeth and death.    Patient was given sedation.  The oropharynx was anesthetized with topical lidocaine.  The transesophageal probe was inserted in the esophagus and stomach and multiple views were obtained.  Agitated saline was used after the transesophageal probe was removed from the body.  The patient was kept under observation until the patient left the procedure room.  The patient left the procedure room in stable condition.   COMPLICATIONS:  There were no immediate complications.  FINDINGS:  1. LEFT VENTRICLE: The left ventricle is normal in structure with  Mild LVH and normal function.  Wall motion is normal.  No thrombus or masses seen in the left ventricle.  2. RIGHT VENTRICLE:  The right ventricle is normal in structure and function without any thrombus or masses.    3. LEFT ATRIUM:  The left atrium is mildly dilated without any thrombus or masses.  4. LEFT ATRIAL APPENDAGE:  The left atrial appendage is free of any thrombus or masses.  5. RIGHT ATRIUM:  The right atrium is free of any thrombus or masses.    6. ATRIAL SEPTUM:  The atrial septum is normal without any ASD or PFO.  7. MITRAL VALVE:  The mitral valve is mildly thickened and calcific and moves well. There is moderate MR with multiple jets. No masses, stenosis or vegetations.  8. TRICUSPID VALVE:  The tricuspid valve is normal in structure and function with mild regurgitation, masses, stenosis or vegetations.  9. AORTIC VALVE:  The aortic valve is heavily calcified and restricted in motion. It has moderate stenosis and mild  regurgitation. No masses or vegetations.  10. PULMONIC VALVE:  The pulmonic valve is normal in structure and function with minimal regurgitation, no masses, stenosis or vegetations.  11. AORTIC ARCH, ASCENDING AND DESCENDING AORTA:  The aorta had significant diffuse atherosclerosis in the ascending, arch and descending aorta.  .  12.  Superior Vena Cava : No thrombus or catheter.  13.  Pulmonary Veins: Visible.  14.  Pulmonary artery: visible and normal.   IMPRESSION:   1. Normal LV systolic function. 2. Moderate MR and mild TR  And trace PI.. 3. Moderate AS and mild AR of thick and calcific AV with restricted motion.. 4. No vegetations. 5. No ASD or PFO.Marland Kitchen  RECOMMENDATIONS:    Medical treatment.Marland Kitchen

## 2017-11-17 NOTE — Progress Notes (Signed)
SATURATION QUALIFICATIONS: (This note is used to comply with regulatory documentation for home oxygen)  Patient Saturations on Room Air at Rest = 87%  Patient Saturations on Room Air while Ambulating = 83%  Patient Saturations on 4 Liters of oxygen while Ambulating = 92%  Please briefly explain why patient needs home oxygen:Pt requires O2 at rest and with activity to keep sats >90%.  [place] [phone] (pager)

## 2017-11-17 NOTE — Progress Notes (Signed)
Hospitalist progress note   Raymond Swanson  GUR:[id number] DOB: Sep 13, 1924 DOA: 11/13/2017 PCP: Marletta Lor, MD Specialists:  Infectious disease Palliative care  Brief Narrative:  84 year old prior history HTN hypothyroid hyperlipidemia multiple left upper extremity squamous cell carcinomas with prior resections by surgery not wishing much further workup 08/11/2016--, prostate cancer status post surgery 01/2007-most recent PSA 58,  prior negative cardiac stress test 2015 hospitalized 01/2016 with SDH and SAH cortical contusions and vertebral fractures --- patient did have bladder tumor resection around 2/1 for symptomatic intermittent gross hematuria 03/2018 tele-,  Assessment & Plan:   Assessment:  The primary encounter diagnosis was Fever, unspecified fever cause. Diagnoses of Hypoxia, Acute bronchitis, unspecified organism, Elevated troponin, Anemia, unspecified type, and SOB (shortness of breath) were also pertinent to this visit.  Fever secondary to enterococcusIs, pansensitive cause probably from instrumentation with recent bladder resection 09/2017-for now continuing Unasyn-white count 11.0 platelets 174 TEE performed 3/27 shows NO endocarditis--Will defer duration of Amoxil to Id as OP Will need SNF on d/c  Acute respiratory distress with acute respiratory failure with hypoxia on admission Rx with oral Lasix , continue DuoNeb 3 mils every 4 as needed  Acute kidney injury-o BUN/creatinine 24/1.2 at time of surgery 09/2017, on admission 20/1.7-diuresing slowly but surely-cutting back Lasix 40 p.o. Daily to 20 daily 3/27  Elevated troponin borderline-no workup at this stage  Hypertensive urgency on admission-improved continue Coreg 3.125 twice daily, on hydralazine 10 mg every 4 as needed pressures above 170  Hypothyroidism-continue levothyroxine 50 mcg every morning, needs outpatient TSH one-month  Hypokalemia-potassium down from 3.4-->3.2 so will increase dose to 40 twice daily  and recheck a.m.   some delirium 2.5 every afternoon as well as Haldol 2 mg-Started on Zydis every 6 as needed agitation  Reflux continue Pepcid 20 daily at bedtime  Advanced directives-goals of care performed and patient is DNR but at this time which is full cope treatment and if something changes then will discuss  Normocytic anemia hemoglobin baseline 13 back in July 2018 currently 10-probably secondary to disease process.  Monitor  DVT prophylaxis: lovenoex  Code Status:   full   Family Communication:    Discussed with wife 3.27 on phone  Disposition Plan: Likely SNF in am 3/28 if all stable    Consultants:    ID  Procedures:   None   Antimicrobials:    unasyn 3/23  Subjective:  No confusion Vacillating between home and SNF Seems a little confused and unclear about what brought on bactermia--I tried to explain this in various ways No fever no cp no sob no n, no vomit  Objective: Vitals:   11/16/17 2200 11/17/17 0000 11/17/17 0100 11/17/17 0405  BP: (!) 127/58 (!) 141/85  (!) 152/52  Pulse: 70 77 72 (!) 59  Resp: 18 18 20 15   Temp:    98.6 F (37 C)  TempSrc:    Oral  SpO2: 99% 93% 96% 98%  Weight:      Height:        Intake/Output Summary (Last 24 hours) at 11/17/2017 0749 Last data filed at 11/17/2017 0150 Gross per 24 hour  Intake 920 ml  Output 1175 ml  Net -255 ml   Filed Weights   11/14/17 1644  Weight: 59.8 kg (131 lb 13.4 oz)    Examination: Frail pleasant in nad-poor dentition No ict no pallor s1 s 2no m/r/g abd soft nt nd no rebound Neuro intact moves 4 limbs  paych intact  Data  Reviewed: I have personally reviewed following labs and imaging studies  CBC: Recent Labs  Lab 11/13/17 2119 11/14/17 0418 11/14/17 1508 11/15/17 0134 11/16/17 0227 11/17/17 0327  WBC 9.3 7.8 11.9* 8.8 13.2* 11.0*  NEUTROABS 8.2*  --   --   --   --   --   HGB 9.7* 8.8* 10.8* 10.1* 10.0* 10.0*  HCT 32.0* 28.6* 35.7* 33.2* 33.1* 33.5*  MCV 86.3 87.2  89.5 86.9 85.3 86.3  PLT 167 133* 175 148* 170 865   Basic Metabolic Panel: Recent Labs  Lab 11/14/17 0418 11/14/17 1508 11/15/17 0134 11/16/17 0227 11/17/17 0327  NA 143 140 143 143 146*  K 3.9 4.7 3.5 3.2* 3.4*  CL 110 109 105 102 107  CO2 21* 20* 27 28 26   GLUCOSE 136* 227* 144* 135* 99  BUN 27* 32* 37* 53* 54*  CREATININE 1.75* 1.69* 1.74* 1.86* 1.63*  CALCIUM 7.7* 8.0* 8.0* 8.1* 8.1*  MG  --   --   --  2.2  --    GFR: Estimated Creatinine Clearance: 25 mL/min (A) (by C-G formula based on SCr of 1.63 mg/dL (H)). Liver Function Tests: Recent Labs  Lab 11/13/17 2119  AST 39  ALT 17  ALKPHOS 80  BILITOT 0.6  PROT 6.4*  ALBUMIN 3.2*   No results for input(s): LIPASE, AMYLASE in the last 168 hours. No results for input(s): AMMONIA in the last 168 hours. Coagulation Profile: No results for input(s): INR, PROTIME in the last 168 hours. Cardiac Enzymes: Recent Labs  Lab 11/13/17 2119 11/14/17 0418 11/14/17 0935  TROPONINI 0.21* 0.27* 0.20*   CBG: No results for input(s): GLUCAP in the last 168 hours. Urine analysis:    Component Value Date/Time   COLORURINE YELLOW 11/14/2017 0057   APPEARANCEUR HAZY (A) 11/14/2017 0057   LABSPEC 1.018 11/14/2017 0057   PHURINE 5.0 11/14/2017 0057   GLUCOSEU NEGATIVE 11/14/2017 0057   HGBUR MODERATE (A) 11/14/2017 0057   HGBUR 1+ 10/22/2009 0859   BILIRUBINUR NEGATIVE 11/14/2017 0057   BILIRUBINUR n 12/24/2015 1119   KETONESUR NEGATIVE 11/14/2017 0057   PROTEINUR 100 (A) 11/14/2017 0057   UROBILINOGEN 0.2 12/24/2015 1119   UROBILINOGEN 0.2 10/22/2009 0859   NITRITE NEGATIVE 11/14/2017 0057   LEUKOCYTESUR SMALL (A) 11/14/2017 0057     Radiology Studies: Reviewed images personally in health database    Scheduled Meds: . aspirin EC  81 mg Oral Daily  . carvedilol  3.125 mg Oral BID WC  . famotidine  20 mg Oral QHS  . furosemide  40 mg Oral Daily  . heparin  5,000 Units Subcutaneous Q8H  . levothyroxine  50 mcg  Oral QAC breakfast  . OLANZapine zydis  2.5 mg Oral QHS  . potassium chloride  40 mEq Oral Once  . simvastatin  20 mg Oral q1800   Continuous Infusions: . ampicillin-sulbactam (UNASYN) IV Stopped (11/17/17 0150)     LOS: 3 days    Time spent: [place], MD [facility] ([facility]   If 7PM-7AM, please contact night-coverage [email or web address] Password [facility] 11/17/2017, 7:49 AM

## 2017-11-17 NOTE — Progress Notes (Signed)
Physical Therapy Treatment Patient Details Name: Raymond Swanson MRN: [id number] DOB: 05/08/1925 Today's Date: 11/17/2017    History of Present Illness Pt admit with weakness due to enterococcal bacteremia, but some respiratory distress from mild CHF. history of CAD, HTN, hx of Prostate CA, hematuria found to have papillary bladder tumer s/p cystoscopy, TURBT and R JJ stent placement on 2/20.    PT Comments    Pt admitted with above diagnosis. Pt currently with functional limitations due to balance and endurance deficits. Pt was able to ambulate with min to min guard assist with RW.  Desats at rest and with activity without O2.  Replaced O2.   Pt will benefit from skilled PT to increase their independence and safety with mobility to allow discharge to the venue listed below.    SATURATION QUALIFICATIONS: (This note is used to comply with regulatory documentation for home oxygen)  Patient Saturations on Room Air at Rest = 87%  Patient Saturations on Room Air while Ambulating = 83%  Patient Saturations on 4 Liters of oxygen while Ambulating = 92%  Please briefly explain why patient needs home oxygen:Pt requires O2 at rest and with activity to keep sats >90%.    Follow Up Recommendations  SNF;Supervision/Assistance - 24 hour(short term rehab may benefit pt)     Equipment Recommendations  None recommended by PT    Recommendations for Other Services       Precautions / Restrictions Precautions Precautions: Fall Restrictions Weight Bearing Restrictions: No    Mobility  Bed Mobility                  Transfers Overall transfer level: Needs assistance Equipment used: Rolling walker (2 wheeled) Transfers: Sit to/from [facility] Sit to Stand: Min assist Stand pivot transfers: Min assist       General transfer comment: Pt needs steadying assist to stand.  Pt wanted to use 3N1 therefore stand pivot to the 3N1.  Had to clean pt after he had had a BM.  Pt  can stand with RW with min guard assist to be cleaned.  Desat without O2.  Able to take pivotal steps with fair safety with RW.Marland Kitchen  Ambulation/Gait Ambulation/Gait assistance: Min guard;Min assist Ambulation Distance (Feet): 150 Feet Assistive device: Rolling walker (2 wheeled) Gait Pattern/deviations: Step-through pattern;Decreased stride length;Trunk flexed   Gait velocity interpretation: Below normal speed for age/gender General Gait Details: Pt was able to ambulate with RW with min assist for steadying at times and cues to sequence steps and RW.  Pt needed cues to stay close to rW as well.  Desats with activity.     Stairs            Wheelchair Mobility    Modified Rankin (Stroke Patients Only)       Balance Overall balance assessment: Needs assistance Sitting-balance support: No upper extremity supported;Feet supported Sitting balance-Leahy Scale: Fair     Standing balance support: Bilateral upper extremity supported;During functional activity Standing balance-Leahy Scale: Poor Standing balance comment: relies on UE support for balance                            Cognition Arousal/Alertness: Awake/alert Behavior During Therapy: WFL for tasks assessed/performed Overall Cognitive Status: Within Functional Limits for tasks assessed  Exercises General Exercises - Lower Extremity Ankle Circles/Pumps: AROM;Both;10 reps;Supine Long Arc Quad: AROM;Both;10 reps;Seated    General Comments        Pertinent Vitals/Pain Pain Assessment: No/denies pain    Home Living                      Prior Function            PT Goals (current goals can now be found in the care plan section) Acute Rehab PT Goals Patient Stated Goal: to get better Progress towards PT goals: Progressing toward goals    Frequency    Min 3X/week      PT Plan Current plan remains appropriate    Co-evaluation               AM-PAC PT "6 Clicks" Daily Activity  Outcome Measure  Difficulty turning over in bed (including adjusting bedclothes, sheets and blankets)?: None Difficulty moving from lying on back to sitting on the side of the bed? : None Difficulty sitting down on and standing up from a chair with arms (e.g., wheelchair, bedside commode, etc,.)?: A Little Help needed moving to and from a bed to chair (including a wheelchair)?: A Little Help needed walking in hospital room?: A Little Help needed climbing 3-5 steps with a railing? : Total 6 Click Score: 18    End of Session Equipment Utilized During Treatment: Gait belt;Oxygen Activity Tolerance: Patient limited by fatigue Patient left: in chair;with call bell/phone within reach;with chair alarm set Nurse Communication: Mobility status PT Visit Diagnosis: Unsteadiness on feet (R26.81);Muscle weakness (generalized) (M62.81)     Time: 1601-0932 PT Time Calculation (min) (ACUTE ONLY): 25 min  Charges:  $Gait Training: 8-22 mins $Self Care/Home Management: 8-22                    G Codes:       Raymond Swanson,PT Acute Rehabilitation [phone] (pager)    Raymond Swanson 11/17/2017, 10:32 AM

## 2017-11-17 NOTE — Progress Notes (Signed)
Gave report to night shift RN. Later, found patient out of bed and walking to bathroom. [place] tele alarm go off. Patient reported that he, "could not wait any longer because I had an accident." Instructed patient to call for help. Bed alarm was off. Patient had not had any issues trying to get out of bed. Raymond Swanson was on other side of bed since PT was working with him that day and sat him on chair. Cleaned patient up. Reset bed alarm. Hooked him back on tele monitor. Placed patient back on bed.

## 2017-11-17 NOTE — Progress Notes (Signed)
[facility] for Infectious Disease  Date of Admission:  11/13/2017      Total days of antibiotics 4  Day 3 unasyn           ASSESSMENT: 84 y.o. male with enterococcal (S) bacteremia and possible pneumonia. He is feeling nearly as good as he was prior to admission and anxious to go home. Transthoracic echo results reviewed - no obvious vegetations but he does have heavy calcifications of the aortic valve and insufficiency; 2 sets of blood cultures strongly positive at admission and no definitive alternative source to explain his bacteremia. In consideration of the above findings his risk for endocarditis involvement is higher and warrants TEE for further work up.   PLAN: 1. Would recommend TEE for further evaluation to exclude endocarditis in Raymond Swanson 2. Continue ampicillin-sulbactam for now.  3. Would not place PICC for now until TEE results are obtained.  4. Will defer to primary team regarding telemetry need.   I discussed role and benefit of having TEE results to help with duration / route of treatment for him. He would ultimately like to do whatever it takes for best chance at cure and understands the need for the second test and how it impacts his treatment decisions. He would like to proceed and discuss with cardiology.   Principal Problem:   Enterococcal bacteremia Active Problems:   Pneumonia   Fever   Aortic insufficiency with aortic stenosis   Malignant neoplasm of posterior wall of urinary bladder (HCC)   Hypothyroidism   Dyslipidemia   Essential hypertension   Coronary atherosclerosis   PROSTATE CANCER, HX OF   Fall   Acute bronchitis   Palliative care encounter   . aspirin EC  81 mg Oral Daily  . carvedilol  3.125 mg Oral BID WC  . famotidine  20 mg Oral QHS  . furosemide  40 mg Oral Daily  . heparin  5,000 Units Subcutaneous Q8H  . levothyroxine  50 mcg Oral QAC breakfast  . OLANZapine zydis  2.5 mg Oral QHS  . potassium chloride  40 mEq Oral  Once  . potassium chloride  40 mEq Oral BID  . simvastatin  20 mg Oral q1800    SUBJECTIVE: Up in the chair and tells me his breakfast was horrible. He got confused last night and "tangled in all these wires."  Curious about the heart test he had and plan going forward. Otherwise no other concerns on his behalf. Still has an occasional cough but not productive.   Review of Systems  Constitutional: Negative for chills and fever.  HENT: Negative for tinnitus.   Eyes: Negative for blurred vision and photophobia.  Respiratory: Positive for cough. Negative for sputum production and shortness of breath.   Cardiovascular: Negative for chest pain.  Gastrointestinal: Negative for diarrhea, nausea and vomiting.  Genitourinary: Negative for dysuria, flank pain, frequency and hematuria.  Musculoskeletal:       Feels weak   Skin: Negative for rash.  Neurological: Negative for headaches.   No Known Allergies  OBJECTIVE: Vitals:   11/17/17 0100 11/17/17 0405 11/17/17 0748 11/17/17 0938  BP:  (!) 152/52 (!) 143/50   Pulse: 72 (!) 59  82  Resp: 20 15    Temp:  98.6 F (37 C) 97.7 F (36.5 C)   TempSrc:  Oral Oral   SpO2: 96% 98%    Weight:      Height:  Body mass index is 18.92 kg/m.  Physical Exam  Constitutional: He is oriented to person, place, and time and well-developed, well-nourished, and in no distress.  HENT:  Mouth/Throat: Oropharynx is clear and moist. No oral lesions. Normal dentition. No dental caries.  Eyes: No scleral icterus.  Cardiovascular: Normal rate and regular rhythm.  Murmur (2/6 systolic murmur right sternal boarder ) heard. Pulmonary/Chest: Effort normal. No respiratory distress.  Faint wheezing, shallow inspiratory effort.   Abdominal: Soft. He exhibits no distension. There is no tenderness.  Musculoskeletal: Normal range of motion. He exhibits no edema.  Lymphadenopathy:    He has no cervical adenopathy.  Neurological: He is alert and oriented to  person, place, and time.  Skin: Skin is warm and dry. No rash noted.  Psychiatric: Mood and affect normal.   Lab Results Lab Results  Component Value Date   WBC 11.0 (H) 11/17/2017   HGB 10.0 (L) 11/17/2017   HCT 33.5 (L) 11/17/2017   MCV 86.3 11/17/2017   PLT 174 11/17/2017    Lab Results  Component Value Date   CREATININE 1.63 (H) 11/17/2017   BUN 54 (H) 11/17/2017   NA 146 (H) 11/17/2017   K 3.4 (L) 11/17/2017   CL 107 11/17/2017   CO2 26 11/17/2017    Lab Results  Component Value Date   ALT 17 11/13/2017   AST 39 11/13/2017   ALKPHOS 80 11/13/2017   BILITOT 0.6 11/13/2017     Microbiology: BCx 3/23 >> enterococcus faecalis 4 bottles  BCx 3/25 >> no growth [place], MSN, NP-C [facility] for Infectious [place]: [phone] Pager: [phone] 11/17/2017  9:52 AM

## 2017-11-17 NOTE — Progress Notes (Signed)
Echocardiogram Transesophageal has been performed.  Raymond Swanson 11/17/2017, 2:09 PM

## 2017-11-17 NOTE — Consult Note (Signed)
Ref: Marletta Lor, MD   Subjective:  Patient with enterococcal bacteremia. He needs TEE to r/o vegetations.  Objective:  Vital Signs in the last 24 hours: Temp:  [97.7 F (36.5 C)-98.6 F (37 C)] 97.7 F (36.5 C) (03/27 0748) Pulse Rate:  [59-82] 82 (03/27 0938) Cardiac Rhythm: Sinus bradycardia (03/27 0700) Resp:  [15-20] 15 (03/27 0405) BP: (119-152)/(44-98) 143/50 (03/27 0748) SpO2:  [93 %-99 %] 98 % (03/27 0405)  Physical Exam: BP Readings from Last 1 Encounters:  11/17/17 (!) 143/50     Wt Readings from Last 1 Encounters:  11/14/17 59.8 kg (131 lb 13.4 oz)    Weight change:  Body mass index is 18.92 kg/m. HEENT: [place]/AT, Eyes-Blue, PERL, EOMI, Conjunctiva-Pale pink, Sclera-Non-icteric Neck: No JVD, No bruit, Trachea midline. Lungs:  Clear, Bilateral. Cardiac:  Regular rhythm, normal S1 and S2, no S3. III/VI systolic murmur. Abdomen:  Soft, non-tender. BS present. Extremities:  No edema present. No cyanosis. No clubbing. CNS: AxOx3, Cranial nerves grossly intact, moves all 4 extremities.  Skin: Warm and dry.   Intake/Output from previous day: 03/26 0701 - 03/27 0700 In: 920 [P.O.:720; IV Piggyback:200] Out: 1175 [Urine:1175]    Lab Results: BMET    Component Value Date/Time   NA 146 (H) 11/17/2017 0327   NA 143 11/16/2017 0227   NA 143 11/15/2017 0134   K 3.4 (L) 11/17/2017 0327   K 3.2 (L) 11/16/2017 0227   K 3.5 11/15/2017 0134   CL 107 11/17/2017 0327   CL 102 11/16/2017 0227   CL 105 11/15/2017 0134   CO2 26 11/17/2017 0327   CO2 28 11/16/2017 0227   CO2 27 11/15/2017 0134   GLUCOSE 99 11/17/2017 0327   GLUCOSE 135 (H) 11/16/2017 0227   GLUCOSE 144 (H) 11/15/2017 0134   BUN 54 (H) 11/17/2017 0327   BUN 53 (H) 11/16/2017 0227   BUN 37 (H) 11/15/2017 0134   CREATININE 1.63 (H) 11/17/2017 0327   CREATININE 1.86 (H) 11/16/2017 0227   CREATININE 1.74 (H) 11/15/2017 0134   CALCIUM 8.1 (L) 11/17/2017 0327   CALCIUM 8.1 (L) 11/16/2017 0227    CALCIUM 8.0 (L) 11/15/2017 0134   GFRNONAA 35 (L) 11/17/2017 0327   GFRNONAA 30 (L) 11/16/2017 0227   GFRNONAA 33 (L) 11/15/2017 0134   GFRAA 41 (L) 11/17/2017 0327   GFRAA 35 (L) 11/16/2017 0227   GFRAA 38 (L) 11/15/2017 0134   CBC    Component Value Date/Time   WBC 11.0 (H) 11/17/2017 0327   RBC 3.88 (L) 11/17/2017 0327   HGB 10.0 (L) 11/17/2017 0327   HCT 33.5 (L) 11/17/2017 0327   PLT 174 11/17/2017 0327   MCV 86.3 11/17/2017 0327   MCH 25.8 (L) 11/17/2017 0327   MCHC 29.9 (L) 11/17/2017 0327   RDW 14.8 11/17/2017 0327   LYMPHSABS 0.7 11/13/2017 2119   MONOABS 0.4 11/13/2017 2119   EOSABS 0.0 11/13/2017 2119   BASOSABS 0.0 11/13/2017 2119   HEPATIC Function Panel Recent Labs    02/22/17 1428 11/13/17 2119  PROT 6.6 6.4*   HEMOGLOBIN A1C No components found for: HGA1C,  MPG CARDIAC ENZYMES Lab Results  Component Value Date   TROPONINI 0.20 (HH) 11/14/2017   TROPONINI 0.27 (HH) 11/14/2017   TROPONINI 0.21 (HH) 11/13/2017   BNP No results for input(s): PROBNP in the last 8760 hours. TSH Recent Labs    02/22/17 1428 04/27/17 1330 11/15/17 1406  TSH 5.05* 4.08 3.979   CHOLESTEROL Recent Labs  11/14/17 0418  CHOL 94    Scheduled Meds: . aspirin EC  81 mg Oral Daily  . carvedilol  3.125 mg Oral BID WC  . famotidine  20 mg Oral QHS  . furosemide  40 mg Oral Daily  . heparin  5,000 Units Subcutaneous Q8H  . levothyroxine  50 mcg Oral QAC breakfast  . OLANZapine zydis  2.5 mg Oral QHS  . potassium chloride  40 mEq Oral Once  . potassium chloride  40 mEq Oral BID  . simvastatin  20 mg Oral q1800   Continuous Infusions: . ampicillin-sulbactam (UNASYN) IV Stopped (11/17/17 0150)   PRN Meds:.haloperidol lactate, hydrALAZINE, HYDROcodone-acetaminophen, ipratropium-albuterol, Melatonin, morphine injection, nitroGLYCERIN  Assessment/Plan: Enterococcal Bacteremia Pneumonia Aortic stenosis Malignant neoplasm of posterior wall of  bladder Hypothyroidism Hypertension H/O prostate cancer  TEE today. Patient understood procedure and need for evaluation.   LOS: 3 days    Dixie Dials  MD  11/17/2017, 10:17 AM

## 2017-11-17 NOTE — Progress Notes (Signed)
CSW spoke with pt wife concerning plan for time of DC- confirms that [facility] would be choice for SNF but now questioning if pt can come home with home health services.  CSW attempted to have conversation regarding patient possibly needing IV antibiotics and if this would affect her desire for pt to return home but unclear level of understanding.  Conversation ended that we would see what final recommendations are tomorrow but CSW able to continue following for SNF.  Insurance auth started for possible transfer to SNF when stable.  Jorge Ny, LCSW Clinical Social Worker [phone]

## 2017-11-18 DIAGNOSIS — B952 Enterococcus as the cause of diseases classified elsewhere: Secondary | ICD-10-CM | POA: Diagnosis not present

## 2017-11-18 DIAGNOSIS — J209 Acute bronchitis, unspecified: Secondary | ICD-10-CM | POA: Diagnosis not present

## 2017-11-18 DIAGNOSIS — G8911 Acute pain due to trauma: Secondary | ICD-10-CM | POA: Diagnosis not present

## 2017-11-18 DIAGNOSIS — R2681 Unsteadiness on feet: Secondary | ICD-10-CM | POA: Diagnosis not present

## 2017-11-18 DIAGNOSIS — R278 Other lack of coordination: Secondary | ICD-10-CM | POA: Diagnosis not present

## 2017-11-18 DIAGNOSIS — R7881 Bacteremia: Secondary | ICD-10-CM | POA: Diagnosis not present

## 2017-11-18 DIAGNOSIS — R0902 Hypoxemia: Secondary | ICD-10-CM | POA: Diagnosis not present

## 2017-11-18 DIAGNOSIS — R41841 Cognitive communication deficit: Secondary | ICD-10-CM | POA: Diagnosis not present

## 2017-11-18 DIAGNOSIS — M6389 Disorders of muscle in diseases classified elsewhere, multiple sites: Secondary | ICD-10-CM | POA: Diagnosis not present

## 2017-11-18 DIAGNOSIS — J189 Pneumonia, unspecified organism: Secondary | ICD-10-CM | POA: Diagnosis not present

## 2017-11-18 LAB — CBC WITH DIFFERENTIAL/PLATELET
BASOS ABS: 0 10*3/uL (ref 0.0–0.1)
BASOS PCT: 0 %
Eosinophils Absolute: 0.1 10*3/uL (ref 0.0–0.7)
Eosinophils Relative: 1 %
HEMATOCRIT: 34 % — AB (ref 39.0–52.0)
HEMOGLOBIN: 10.1 g/dL — AB (ref 13.0–17.0)
LYMPHS ABS: 1 10*3/uL (ref 0.7–4.0)
Lymphocytes Relative: 12 %
MCH: 25.9 pg — AB (ref 26.0–34.0)
MCHC: 29.7 g/dL — AB (ref 30.0–36.0)
MCV: 87.2 fL (ref 78.0–100.0)
MONOS PCT: 11 %
Monocytes Absolute: 0.9 10*3/uL (ref 0.1–1.0)
NEUTROS ABS: 6.2 10*3/uL (ref 1.7–7.7)
Neutrophils Relative %: 76 %
Platelets: 182 10*3/uL (ref 150–400)
RBC: 3.9 MIL/uL — ABNORMAL LOW (ref 4.22–5.81)
RDW: 14.7 % (ref 11.5–15.5)
WBC: 8.2 10*3/uL (ref 4.0–10.5)

## 2017-11-18 LAB — BASIC METABOLIC PANEL
Anion gap: 10 (ref 5–15)
BUN: 45 mg/dL — AB (ref 6–20)
CALCIUM: 8.5 mg/dL — AB (ref 8.9–10.3)
CHLORIDE: 107 mmol/L (ref 101–111)
CO2: 32 mmol/L (ref 22–32)
CREATININE: 1.74 mg/dL — AB (ref 0.61–1.24)
GFR calc non Af Amer: 33 mL/min — ABNORMAL LOW (ref >60)
GFR, EST AFRICAN AMERICAN: 38 mL/min — AB (ref >60)
Glucose, Bld: 125 mg/dL — ABNORMAL HIGH (ref 65–99)
Potassium: 4 mmol/L (ref 3.5–5.1)
Sodium: 149 mmol/L — ABNORMAL HIGH (ref 135–145)

## 2017-11-18 MED ORDER — CARVEDILOL 3.125 MG PO TABS: 3.1250 mg | ORAL_TABLET | Freq: Two times a day (BID) | ORAL | 1 refills | Status: DC

## 2017-11-18 MED ORDER — FUROSEMIDE 40 MG PO TABS: 20.0000 mg | ORAL_TABLET | Freq: Every day | ORAL | 0 refills | Status: DC

## 2017-11-18 MED ORDER — AMOXICILLIN 500 MG PO CAPS: 500.0000 mg | ORAL_CAPSULE | Freq: Two times a day (BID) | ORAL | 0 refills | Status: AC

## 2017-11-18 NOTE — Progress Notes (Signed)
Report given to RN at [facility]. Patient going to room 401A.

## 2017-11-18 NOTE — Clinical Social Work Placement (Signed)
   CLINICAL SOCIAL WORK PLACEMENT  NOTE  Date:  11/18/2017  Patient Details  Name: Raymond Swanson MRN: [id number] Date of Birth: July 22, 1925  Clinical Social Work is seeking post-discharge placement for this patient at the [place] level of care (*CSW will initial, date and re-position this form in  chart as items are completed):  Yes   Patient/family provided with [facility] Work Department's list of facilities offering this level of care within the geographic area requested by the patient (or if unable, by the patient's family).  Yes   Patient/family informed of their freedom to choose among providers that offer the needed level of care, that participate in Medicare, Medicaid or managed care program needed by the patient, have an available bed and are willing to accept the patient.  Yes   Patient/family informed of [facility]'s ownership interest in [facility] and [facility], as well as of the fact that they are under no obligation to receive care at these facilities.  PASRR submitted to EDS on 11/15/17     PASRR number received on 11/15/17     Existing PASRR number confirmed on       FL2 transmitted to all facilities in geographic area requested by pt/family on 11/15/17     FL2 transmitted to all facilities within larger geographic area on       Patient informed that his/her managed care company has contracts with or will negotiate with certain facilities, including the following:        Yes   Patient/family informed of bed offers received.  Patient chooses bed at [place], [place]     Physician recommends and patient chooses bed at      Patient to be transferred to [facility] on 11/18/17.  Patient to be transferred to facility by ptar     Patient family notified on 11/18/17 of transfer.  Name of family member notified:  Sunday Spillers     PHYSICIAN Please sign FL2, Please sign DNR     Additional Comment:     _______________________________________________ Jorge Ny, LCSW 11/18/2017, 11:49 AM

## 2017-11-18 NOTE — Care Management Note (Signed)
Case Management Note  Patient Details  Name: Raymond Swanson MRN: [id number] Date of Birth: 1924/08/26  Subjective/Objective:                    Action/Plan: Pt is discharging to [facility]. CM signing off.    Expected Discharge Date:  11/18/17               Expected Discharge Plan:  Skilled Nursing Facility  In-House Referral:  Clinical Social Work  Discharge planning Services  CM Consult  Post Acute Care Choice:    Choice offered to:     DME Arranged:    DME Agency:     HH Arranged:    [place] Agency:     Status of Service:  Completed, signed off  If discussed at [employer], dates discussed:    Additional Comments:  Pollie Friar, RN 11/18/2017, 12:21 PM

## 2017-11-18 NOTE — Progress Notes (Signed)
[facility] for Infectious Disease  Date of Admission:  11/13/2017      Total days of antibiotics 5  Day 5 unasyn           ASSESSMENT: 84 y.o. male with enterococcal (S) bacteremia and possible pneumonia. He feels much improved however still with some weakness overall. We felt him to be higher risk for endocarditis and pursued TEE which was negative. Considering his blood cultures cleared quickly and he has had rapid improvement with 5 days of IV ampicillin-sulbactam we decided not to risk side effects from PICC line and plan on finishing out with oral amoxicillin.  PLAN: 1. Continue Amoxicillin 500 mg BID PO through April 8th.   He requested our office call his family in a few days to arrange for follow up appointment in about 3-4 weeks.   Principal Problem:   Enterococcal bacteremia Active Problems:   Pneumonia   Fever   Aortic insufficiency with aortic stenosis   Malignant neoplasm of posterior wall of urinary bladder (HCC)   Hypothyroidism   Dyslipidemia   Essential hypertension   Coronary atherosclerosis   PROSTATE CANCER, HX OF   Fall   Acute bronchitis   Palliative care encounter   . amoxicillin  500 mg Oral Q12H  . aspirin EC  81 mg Oral Daily  . carvedilol  3.125 mg Oral BID WC  . famotidine  20 mg Oral QHS  . furosemide  40 mg Oral Daily  . heparin  5,000 Units Subcutaneous Q8H  . levothyroxine  50 mcg Oral QAC breakfast  . OLANZapine zydis  2.5 mg Oral QHS  . potassium chloride  40 mEq Oral Once  . potassium chloride  40 mEq Oral BID  . simvastatin  20 mg Oral q1800    SUBJECTIVE: Tells me that "going to a rehab facility will be the best for him while he gets his strength back and over his infection. Feeling 80% better but just weak.   No Known Allergies  OBJECTIVE: Vitals:   11/18/17 0400 11/18/17 0758 11/18/17 0951 11/18/17 1200  BP: (!) 123/103 (!) 144/75 (!) 168/56 (!) 105/54  Pulse: 69 83 78 81  Resp: 20 17  15   Temp:  (!) 97  F (36.1 C)  97.9 F (36.6 C)  TempSrc:  Oral  Oral  SpO2: 99% 95%  98%  Weight:      Height:       Body mass index is 18.92 kg/m.  Physical Exam  Constitutional: He is oriented to person, place, and time and well-developed, well-nourished, and in no distress.  Sitting in bed ready to eat lunch.   HENT:  Mouth/Throat: Oropharynx is clear and moist. No oral lesions. Normal dentition. No dental caries.  Eyes: No scleral icterus.  Cardiovascular: Normal rate and regular rhythm.  Murmur (2/6 systolic murmur right sternal boarder ) heard. Pulmonary/Chest: Effort normal. No respiratory distress.  Abdominal: Soft. He exhibits no distension. There is no tenderness.  Musculoskeletal: Normal range of motion. He exhibits no edema.  Lymphadenopathy:    He has no cervical adenopathy.  Neurological: He is alert and oriented to person, place, and time.  Skin: Skin is warm and dry. No rash noted.  Psychiatric: Mood, memory, affect and judgment normal.   Lab Results Lab Results  Component Value Date   WBC 8.2 11/18/2017   HGB 10.1 (L) 11/18/2017   HCT 34.0 (L) 11/18/2017   MCV 87.2 11/18/2017   PLT  182 11/18/2017    Lab Results  Component Value Date   CREATININE 1.74 (H) 11/18/2017   BUN 45 (H) 11/18/2017   NA 149 (H) 11/18/2017   K 4.0 11/18/2017   CL 107 11/18/2017   CO2 32 11/18/2017    Lab Results  Component Value Date   ALT 17 11/13/2017   AST 39 11/13/2017   ALKPHOS 80 11/13/2017   BILITOT 0.6 11/13/2017     Microbiology: BCx 3/23 >> enterococcus faecalis 4 bottles  BCx 3/25 >> no growth 48h  Janene Madeira, MSN, NP-C [facility] for Infectious [place] Cell: [phone] Pager: [phone] 11/18/2017  1:48 PM

## 2017-11-18 NOTE — Care Management Important Message (Signed)
Important Message  Patient Details  Name: STATON MARKEY MRN: [id number] Date of Birth: [birth year]   Medicare Important Message Given:  Yes    Katelynne Revak [address] 11/18/2017, 1:06 PM

## 2017-11-18 NOTE — Progress Notes (Signed)
Patient will discharge to [facility] Anticipated discharge date: 3/28 Family notified: pt wife Raymond Swanson by [employer]- scheduled for 1:30pm  CSW signing off.  Jorge Ny, LCSW Clinical Social Worker [phone]

## 2017-11-18 NOTE — Discharge Summary (Signed)
Physician Discharge Summary  Raymond Swanson OVF:[id number] DOB: 1925/04/27 DOA: 11/13/2017  PCP: Marletta Lor, MD  Admit date: 11/13/2017 Discharge date: 11/18/2017  Time spent: 35 minutes   Recommendations for Outpatient Follow-up:  1. New medications on discharge-Amoxicillin to complete 11/26/17, Lasix 20 mg started this admit 2. Needs bmet in 1-2 days--would force oral fluids as had slight elevated sodium 149 on d/c and adjust diuretics at that time at SNF 3. Please get a TSH in 3-4 weeks 4. Needs OP follow-up Dr. Lovena Neighbours of Urology for his Bladder tumour 5. Will need oxygen 2 liters daily on d/c  Discharge Diagnoses:  Principal Problem:   Enterococcal bacteremia Active Problems:   Hypothyroidism   Dyslipidemia   Essential hypertension   Coronary atherosclerosis   PROSTATE CANCER, HX OF   Fall   Pneumonia   Acute bronchitis   Malignant neoplasm of posterior wall of urinary bladder (HCC)   Palliative care encounter   Fever   Aortic insufficiency with aortic stenosis   Discharge Condition: Fair  Diet recommendation: hh low salt  Filed Weights   11/14/17 1644  Weight: 59.8 kg (131 lb 13.4 oz)    History of present illness:  84 year old prior history HTN hypothyroid hyperlipidemia multiple left upper extremity squamous cell carcinomas with prior resections by surgery not wishing much further workup 08/11/2016--, prostate cancer status post surgery 01/2007-most recent PSA 58,  prior negative cardiac stress test 2015 hospitalized 01/2016 with SDH and SAH cortical contusions and vertebral fractures --- patient did have bladder tumor resection around 2/1 for symptomatic intermittent gross hematuria 03/2018    Hospital Course:  Fever secondary to enterococcusIs, pansensitive cause probably from instrumentation with recent bladder resection 09/2017-for now continuing Unasyn-white count 11.0 platelets 174 TEE performed 3/27 shows NO endocarditis--stop date 2 weeks ending  11/26/17  Infectious disease and cardiology input sought and appreciated this admit  Acute respiratory distress with acute respiratory failure with hypoxia on admission Rx with oral Lasix , continue DuoNeb 3 mils every 4 as needed--does need oxygen going forward as per desat to below 88  Acute kidney injury-o BUN/creatinine 24/1.2 at time of surgery 09/2017, on admission 20/1.7-diuresing slowly but surely-cutting back Lasix 40 p.o. Daily to 20 daily 3/27 and will need further adjustment of this as an OP  Bladder tumour-will need OP follow up for potential RX-forwarding to Dr. Lovena Neighbours  Elevated troponin borderline-no workup at this stage  Hypertensive urgency on admission-improved continue Coreg 3.125 twice daily, on hydralazine 10 mg every 4 as needed pressures above 170  Hypothyroidism-continue levothyroxine 50 mcg every morning, needs outpatient TSH one-month  Hypokalemia-potassium down from 3.4-->3.2 so will increase dose to 40 twice daily and recheck a.m.--improved-would not send on Kdur--adjust as OP with diurestics   some delirium - Haldol 2 mg-Started on Zydis every 6 as needed agitation--this was d/c--he has beedn clear to me during hopsital stay  Reflux continue Pepcid 20 daily at bedtime  Advanced directives-goals of care performed and patient is DNR but at this time which is full cope treatment and if something changes then will discuss  Normocytic anemia hemoglobin baseline 13 back in July 2018 currently 10-probably secondary to disease process.  Monitor    Procedures: TEE performed 3/27  1. Normal LV systolic function. 2. Moderate MR and mild TR  And trace PI.. 3. Moderate AS and mild AR of thick and calcific AV with restricted motion.. 4. No vegetations. 5. No ASD or PFO..    Consultations:  ID and  Cardilogy  Discharge Exam: Vitals:   11/18/17 0758 11/18/17 0951  BP: (!) 144/75 (!) 168/56  Pulse: 83 78  Resp: 17   Temp: (!) 97 F (36.1 C)   SpO2:  95%     General: pleasant awake alert in and seems comfortable in nad--poor dentition, no thrush, no thyromegally Cardiovascular: s1 s2 no m/r/g some mild tachy Respiratory: clear without added sound, no rales no rhonchi abd soft nt nd no rebound No le edema no swelling  Discharge Instructions   Discharge Instructions    Diet - low sodium heart healthy   Complete by:  As directed    Increase activity slowly   Complete by:  As directed      Allergies as of 11/18/2017   No Known Allergies     Medication List    TAKE these medications   amoxicillin 500 MG capsule Commonly known as:  AMOXIL Take 1 capsule (500 mg total) by mouth every 12 (twelve) hours for 8 days.   aspirin EC 81 MG tablet Take 81 mg by mouth daily.   carvedilol 3.125 MG tablet Commonly known as:  COREG Take 1 tablet (3.125 mg total) by mouth 2 (two) times daily with a meal.   furosemide 40 MG tablet Commonly known as:  LASIX Take 0.5 tablets (20 mg total) by mouth daily. Start taking on:  11/19/2017   levothyroxine 50 MCG tablet Commonly known as:  SYNTHROID, LEVOTHROID Take 1 tablet (50 mcg total) by mouth daily.   nitroGLYCERIN 0.4 MG SL tablet Commonly known as:  NITROSTAT Place 1 tablet (0.4 mg total) under the tongue every 5 (five) minutes as needed. What changed:  reasons to take this   simvastatin 20 MG tablet Commonly known as:  ZOCOR Take 1 tablet (20 mg total) by mouth daily. What changed:  when to take this      No Known Allergies    The results of significant diagnostics from this hospitalization (including imaging, microbiology, ancillary and laboratory) are listed below for reference.    Significant Diagnostic Studies: Dg Chest Port 1 View  Result Date: 11/16/2017 CLINICAL DATA:  Shortness of Breath EXAM: PORTABLE CHEST 1 VIEW COMPARISON:  11/14/2017 FINDINGS: Prior CABG. Heart is upper limits normal in size. Bibasilar atelectasis. No visible effusions or acute bony  abnormality. IMPRESSION: Bibasilar atelectasis. Electronically Signed   By: Rolm Baptise M.D.   On: 11/16/2017 08:58   Dg Chest Port 1 View  Result Date: 11/14/2017 CLINICAL DATA:  Shortness of breath after fall. EXAM: PORTABLE CHEST 1 VIEW COMPARISON:  11/13/2017 FINDINGS: Sternotomy wires unchanged. Lungs are adequately inflated with minimal linear density in the left base unchanged likely atelectasis. No definite effusion. No pneumothorax. Mild stable cardiomegaly. Calcified plaque over the aortic arch. Old left posterior rib fractures. Degenerative change of the spine. IMPRESSION: Minimal stable linear atelectasis left base. Stable mild cardiomegaly. Electronically Signed   By: Marin Olp M.D.   On: 11/14/2017 14:43   Dg Chest Port 1 View  Result Date: 11/13/2017 CLINICAL DATA:  Patient fell and was too weak to get up.  Dyspnea. EXAM: PORTABLE CHEST 1 VIEW COMPARISON:  01/23/2016 FINDINGS: Heart size and mediastinal contours are stable with post CABG change and moderate aortic atherosclerosis. Bibasilar atelectasis is redemonstrated without acute pneumonic consolidation, effusion or pneumothorax. Chronic left-sided rib fractures involving the posterior left sixth and seventh ribs. IMPRESSION: Persistent bibasilar atelectasis. Aortic atherosclerosis with borderline cardiomegaly. Chronic left-sided rib fractures. Electronically Signed   By: Shanon Brow  Randel Pigg M.D.   On: 11/13/2017 21:38    Microbiology: Recent Results (from the past 240 hour(s))  Blood Culture (routine x 2)     Status: Abnormal   Collection Time: 11/13/17  9:20 PM  Result Value Ref Range Status   Specimen Description BLOOD RIGHT FOREARM  Final   Special Requests   Final    BOTTLES DRAWN AEROBIC AND ANAEROBIC Blood Culture adequate volume   Culture  Setup Time   Final    GRAM POSITIVE COCCI IN PAIRS IN CHAINS IN BOTH AEROBIC AND ANAEROBIC BOTTLES CRITICAL RESULT CALLED TO, READ BACK BY AND VERIFIED WITH: Ailene Rud AT 1144  11/14/17 BY L BENFIELD Performed at [facility], [place] [address] [place], [place] [address]    Culture ENTEROCOCCUS FAECALIS (A)  Final   Report Status 11/16/2017 FINAL  Final   Organism ID, Bacteria ENTEROCOCCUS FAECALIS  Final      Susceptibility   Enterococcus faecalis - MIC*    AMPICILLIN <=2 SENSITIVE Sensitive     VANCOMYCIN 2 SENSITIVE Sensitive     GENTAMICIN SYNERGY SENSITIVE Sensitive     * ENTEROCOCCUS FAECALIS  Blood Culture ID Panel (Reflexed)     Status: Abnormal   Collection Time: 11/13/17  9:20 PM  Result Value Ref Range Status   Enterococcus species DETECTED (A) NOT DETECTED Final    Comment: CRITICAL RESULT CALLED TO, READ BACK BY AND VERIFIED WITH: M MACCIA,PHARMD AT 1144 11/14/17 BY L BENFIELD    Vancomycin resistance NOT DETECTED NOT DETECTED Final   Listeria monocytogenes NOT DETECTED NOT DETECTED Final   Staphylococcus species NOT DETECTED NOT DETECTED Final   Staphylococcus aureus NOT DETECTED NOT DETECTED Final   Streptococcus species NOT DETECTED NOT DETECTED Final   Streptococcus agalactiae NOT DETECTED NOT DETECTED Final   Streptococcus pneumoniae NOT DETECTED NOT DETECTED Final   Streptococcus pyogenes NOT DETECTED NOT DETECTED Final   Acinetobacter baumannii NOT DETECTED NOT DETECTED Final   Enterobacteriaceae species NOT DETECTED NOT DETECTED Final   Enterobacter cloacae complex NOT DETECTED NOT DETECTED Final   Escherichia coli NOT DETECTED NOT DETECTED Final   Klebsiella oxytoca NOT DETECTED NOT DETECTED Final   Klebsiella pneumoniae NOT DETECTED NOT DETECTED Final   Proteus species NOT DETECTED NOT DETECTED Final   Serratia marcescens NOT DETECTED NOT DETECTED Final   Haemophilus influenzae NOT DETECTED NOT DETECTED Final   Neisseria meningitidis NOT DETECTED NOT DETECTED Final   Pseudomonas aeruginosa NOT DETECTED NOT DETECTED Final   Candida albicans NOT DETECTED NOT DETECTED Final   Candida glabrata NOT DETECTED NOT DETECTED Final    Candida krusei NOT DETECTED NOT DETECTED Final   Candida parapsilosis NOT DETECTED NOT DETECTED Final   Candida tropicalis NOT DETECTED NOT DETECTED Final    Comment: Performed at [facility], [address], [place], [place] [address]  Blood Culture (routine x 2)     Status: Abnormal   Collection Time: 11/13/17  9:29 PM  Result Value Ref Range Status   Specimen Description BLOOD BLOOD LEFT FOREARM  Final   Special Requests   Final    BOTTLES DRAWN AEROBIC AND ANAEROBIC Blood Culture adequate volume   Culture  Setup Time   Final    GRAM POSITIVE COCCI IN PAIRS IN CHAINS IN BOTH AEROBIC AND ANAEROBIC BOTTLES CRITICAL RESULT CALLED TO, READ BACK BY AND VERIFIED WITH: M MACCIA,PHARMD AT 0240 11/14/17 BY L BENFIELD    Culture (A)  Final    ENTEROCOCCUS FAECALIS  SUSCEPTIBILITIES PERFORMED ON PREVIOUS CULTURE WITHIN THE LAST 5 DAYS. Performed at [facility], [place] [address] [place], [place] [address]    Report Status 11/16/2017 FINAL  Final  Urine culture     Status: Abnormal   Collection Time: 11/14/17 12:57 AM  Result Value Ref Range Status   Specimen Description URINE, RANDOM  Final   Special Requests   Final    NONE Performed at [facility], [place] [address] [place], [place] [address]    Culture MULTIPLE SPECIES PRESENT, SUGGEST RECOLLECTION (A)  Final   Report Status 11/15/2017 FINAL  Final  MRSA PCR Screening     Status: None   Collection Time: 11/14/17  4:28 PM  Result Value Ref Range Status   MRSA by PCR NEGATIVE NEGATIVE Final    Comment:        The GeneXpert MRSA Assay (FDA approved for NASAL specimens only), is one component of a comprehensive MRSA colonization surveillance program. It is not intended to diagnose MRSA infection nor to guide or monitor treatment for MRSA infections. Performed at [facility], [place] [address], [place], [place] [address]   Culture, blood (routine x 2)     Status: None (Preliminary result)   Collection Time:  11/15/17 10:28 AM  Result Value Ref Range Status   Specimen Description BLOOD LEFT HAND  Final   Special Requests   Final    BOTTLES DRAWN AEROBIC ONLY Blood Culture results may not be optimal due to an inadequate volume of blood received in culture bottles   Culture   Final    NO GROWTH 2 DAYS Performed at [facility], [place] [address] [place], [place] [address]    Report Status PENDING  Incomplete  Culture, blood (routine x 2)     Status: None (Preliminary result)   Collection Time: 11/15/17 10:33 AM  Result Value Ref Range Status   Specimen Description BLOOD LEFT HAND  Final   Special Requests   Final    BOTTLES DRAWN AEROBIC ONLY Blood Culture results may not be optimal due to an inadequate volume of blood received in culture bottles   Culture   Final    NO GROWTH 2 DAYS Performed at [facility], [place] [address] [place], [place] [address]    Report Status PENDING  Incomplete     Labs: Basic Metabolic Panel: Recent Labs  Lab 11/14/17 1508 11/15/17 0134 11/16/17 0227 11/17/17 0327 11/18/17 0303  NA 140 143 143 146* 149*  K 4.7 3.5 3.2* 3.4* 4.0  CL 109 105 102 107 107  CO2 20* 27 28 26  32  GLUCOSE 227* 144* 135* 99 125*  BUN 32* 37* 53* 54* 45*  CREATININE 1.69* 1.74* 1.86* 1.63* 1.74*  CALCIUM 8.0* 8.0* 8.1* 8.1* 8.5*  MG  --   --  2.2  --   --    Liver Function Tests: Recent Labs  Lab 11/13/17 2119  AST 39  ALT 17  ALKPHOS 80  BILITOT 0.6  PROT 6.4*  ALBUMIN 3.2*   No results for input(s): LIPASE, AMYLASE in the last 168 hours. No results for input(s): AMMONIA in the last 168 hours. CBC: Recent Labs  Lab 11/13/17 2119  11/14/17 1508 11/15/17 0134 11/16/17 0227 11/17/17 0327 11/18/17 0303  WBC 9.3   < > 11.9* 8.8 13.2* 11.0* 8.2  NEUTROABS 8.2*  --   --   --   --   --  6.2  HGB 9.7*   < > 10.8* 10.1* 10.0* 10.0* 10.1*  HCT 32.0*   < > 35.7* 33.2* 33.1* 33.5* 34.0*  MCV 86.3   < > 89.5 86.9 85.3 86.3 87.2  PLT 167   < > 175 148* 170  174 182   < > = values in this interval not displayed.   Cardiac Enzymes: Recent Labs  Lab 11/13/17 2119 11/14/17 0418 11/14/17 0935  TROPONINI 0.21* 0.27* 0.20*   BNP: BNP (last 3 results) Recent Labs    11/14/17 1508  BNP 533.9*    ProBNP (last 3 results) No results for input(s): PROBNP in the last 8760 hours.  CBG: No results for input(s): GLUCAP in the last 168 hours.     Signed:  Nita Sells MD   [facility] 11/18/2017, 11:28 AM

## 2017-11-20 LAB — CULTURE, BLOOD (ROUTINE X 2)
CULTURE: NO GROWTH
CULTURE: NO GROWTH

## 2017-11-22 ENCOUNTER — Ambulatory Visit: Payer: Medicare Other | Admitting: Cardiology

## 2017-11-30 NOTE — Progress Notes (Deleted)
HPI The patient presents for follow up of CAD.  ***    Since I last saw him he was in the hospital of last year.  He had a fall and SAH after cleaning his gutters.  Since discharge he has done well.  The patient denies any new symptoms such as chest discomfort, neck or arm discomfort. There has been no new shortness of breath, PND or orthopnea. There have been no reported palpitations, presyncope or syncope.  He is just weak.     No Known Allergies  Current Outpatient Medications  Medication Sig Dispense Refill  . aspirin EC 81 MG tablet Take 81 mg by mouth daily.    . carvedilol (COREG) 3.125 MG tablet Take 1 tablet (3.125 mg total) by mouth 2 (two) times daily with a meal. 60 tablet 1  . furosemide (LASIX) 40 MG tablet Take 0.5 tablets (20 mg total) by mouth daily. 30 tablet 0  . levothyroxine (SYNTHROID, LEVOTHROID) 50 MCG tablet Take 1 tablet (50 mcg total) by mouth daily. 90 tablet 4  . nitroGLYCERIN (NITROSTAT) 0.4 MG SL tablet Place 1 tablet (0.4 mg total) under the tongue every 5 (five) minutes as needed. (Patient taking differently: Place 0.4 mg under the tongue every 5 (five) minutes as needed for chest pain. ) 20 tablet 5  . simvastatin (ZOCOR) 20 MG tablet Take 1 tablet (20 mg total) by mouth daily. (Patient taking differently: Take 20 mg by mouth every evening. ) 90 tablet 3   No current facility-administered medications for this visit.     Past Medical History:  Diagnosis Date  . ACUT GASTR ULCER W/HEMORR W/O MENTION OBST 09/10/2009  . ANEMIA 10/08/2009  . Cancer ([place])   . CORONARY ARTERY DISEASE 01/26/2007  . Fall 2016   fall from deck while cleaning gutters; sustained subdemral hematoma ; doing ok now   . History of kidney stones   . HOH (hard of hearing)   . HYPERLIPIDEMIA 01/26/2007  . HYPERTENSION 01/26/2007  . HYPOTHYROIDISM 10/18/2007  . MELENA 08/28/2009  . PROSTATE CANCER, HX OF 01/26/2007  . SKIN CANCER, HX OF 01/26/2007    Past Surgical History:  Procedure  Laterality Date  . CATARACT EXTRACTION    . CORONARY ARTERY BYPASS GRAFT     1991; reports at PAT appt 10-11-17: i went to my priamry doctor for a physical and had aroutine stress test that was bad, was sent for cath ( more than 1 but nsure how many or if stents were placed), he rprots " that didnt work so they did the surgery". denies heart symptoms for over 20 years;  sees  cardiology Raji Glinski annually for EKGs   . CYSTOSCOPY W/ URETERAL STENT PLACEMENT Right 10/13/2017   Procedure: CYSTOSCOPY WITH STENT REPLACEMENT;  Surgeon: Ceasar Mons, MD;  Location: [facility];  Service: Urology;  Laterality: Right;  . EXCISION MASS UPPER EXTREMETIES Left 08/11/2016   Procedure: EXCISION MASS LEFT SHOULDER, LEFT FOREARM, LEFT WRIST;  Surgeon: Judeth Horn, MD;  Location: [facility];  Service: General;  Laterality: Left;  . HERNIA REPAIR     ingunial  . MOHS SURGERY    . PROSTATE SURGERY     prostatectomy  . TEE WITHOUT CARDIOVERSION N/A 11/17/2017   Procedure: TRANSESOPHAGEAL ECHOCARDIOGRAM (TEE);  Surgeon: Dixie Dials, MD;  Location: [facility];  Service: Cardiovascular;  Laterality: N/A;  . TRANSURETHRAL RESECTION OF BLADDER TUMOR N/A 10/13/2017   Procedure: TRANSURETHRAL RESECTION OF BLADDER TUMOR/ BIPOLAR (TURBT);  Surgeon: Ellison Hughs  Marjory Lies, MD;  Location: [facility];  Service: Urology;  Laterality: N/A;  ONLY NEEDS 90 MIN FOR BOTH PROCEDURES    ROS:  Decreased hearing.  ***    PHYSICAL EXAM There were no vitals taken for this visit.  GENERAL:  Well appearing NECK:  No jugular venous distention, waveform within normal limits, carotid upstroke brisk and symmetric, no bruits, no thyromegaly LUNGS:  Clear to auscultation bilaterally CHEST:  Unremarkable HEART:  PMI not displaced or sustained,S1 and S2 within normal limits, no S3, no S4, no clicks, no rubs, *** murmurs ABD:  Flat, positive bowel sounds normal in frequency in pitch, no bruits, no rebound, no guarding, no midline pulsatile  mass, no hepatomegaly, no splenomegaly EXT:  2 plus pulses throughout, no edema, no cyanosis no clubbing    GENERAL:  Well appearing HEENT:  Pupils equal round and reactive, fundi not visualized, oral mucosa unremarkable NECK:  No jugular venous distention, waveform within normal limits, carotid upstroke brisk and symmetric, bilateral bruits, no thyromegaly LUNGS:  Clear to auscultation bilaterally BACK:  No CVA tenderness CHEST:  Well healed sternotomy scar. HEART:  PMI not displaced or sustained,S1 and S2 within normal limits, no S3, no S4, no clicks, no rubs, no murmurs ABD:  Flat, positive bowel sounds normal in frequency in pitch, no bruits, no rebound, no guarding, no midline pulsatile mass, no hepatomegaly, no splenomegaly EXT:  2 plus pulses throughout, no edema, no cyanosis no clubbing   EKG:  ***   Lab Results  Component Value Date   CHOL 94 11/14/2017   TRIG 43 11/14/2017   HDL 43 11/14/2017   LDLCALC 42 11/14/2017    ASSESSMENT AND PLAN  CORONARY ARTERY DISEASE -  ***  The patient has no new sypmtoms.  No further cardiovascular testing is indicated.  We will continue with aggressive risk reduction and meds as listed.  HYPERTENSION -  ***  The blood pressure is at target. No change in medications is indicated. We will continue with therapeutic lifestyle changes (TLC).   HYPERLIPIDEMIA -  ***  His lipids were at goal.  I will defer repeat labs to Marletta Lor, MD

## 2017-12-01 DIAGNOSIS — S065X1A Traumatic subdural hemorrhage with loss of consciousness of 30 minutes or less, initial encounter: Secondary | ICD-10-CM | POA: Diagnosis not present

## 2017-12-01 DIAGNOSIS — R7881 Bacteremia: Secondary | ICD-10-CM | POA: Diagnosis not present

## 2017-12-01 DIAGNOSIS — J42 Unspecified chronic bronchitis: Secondary | ICD-10-CM | POA: Diagnosis not present

## 2017-12-01 DIAGNOSIS — B952 Enterococcus as the cause of diseases classified elsewhere: Secondary | ICD-10-CM | POA: Diagnosis not present

## 2017-12-02 ENCOUNTER — Ambulatory Visit: Payer: Medicare Other | Admitting: Cardiology

## 2017-12-02 DIAGNOSIS — J208 Acute bronchitis due to other specified organisms: Secondary | ICD-10-CM | POA: Diagnosis not present

## 2017-12-02 DIAGNOSIS — A4181 Sepsis due to Enterococcus: Secondary | ICD-10-CM | POA: Diagnosis not present

## 2017-12-02 DIAGNOSIS — I10 Essential (primary) hypertension: Secondary | ICD-10-CM | POA: Diagnosis not present

## 2017-12-02 DIAGNOSIS — R0989 Other specified symptoms and signs involving the circulatory and respiratory systems: Secondary | ICD-10-CM

## 2017-12-02 DIAGNOSIS — J9601 Acute respiratory failure with hypoxia: Secondary | ICD-10-CM | POA: Diagnosis not present

## 2017-12-02 DIAGNOSIS — C674 Malignant neoplasm of posterior wall of bladder: Secondary | ICD-10-CM | POA: Diagnosis not present

## 2017-12-03 ENCOUNTER — Encounter: Payer: Self-pay | Admitting: Cardiology

## 2017-12-09 DIAGNOSIS — H6122 Impacted cerumen, left ear: Secondary | ICD-10-CM | POA: Diagnosis not present

## 2017-12-13 DIAGNOSIS — C61 Malignant neoplasm of prostate: Secondary | ICD-10-CM | POA: Diagnosis not present

## 2017-12-13 DIAGNOSIS — C672 Malignant neoplasm of lateral wall of bladder: Secondary | ICD-10-CM | POA: Diagnosis not present

## 2017-12-15 ENCOUNTER — Ambulatory Visit (INDEPENDENT_AMBULATORY_CARE_PROVIDER_SITE_OTHER): Payer: Medicare Other | Admitting: Internal Medicine

## 2017-12-15 ENCOUNTER — Encounter: Payer: Self-pay | Admitting: Internal Medicine

## 2017-12-15 VITALS — BP 110/60 | HR 95 | Temp 98.4°F | Wt 131.0 lb

## 2017-12-15 DIAGNOSIS — C674 Malignant neoplasm of posterior wall of bladder: Secondary | ICD-10-CM

## 2017-12-15 DIAGNOSIS — I10 Essential (primary) hypertension: Secondary | ICD-10-CM | POA: Diagnosis not present

## 2017-12-15 DIAGNOSIS — I062 Rheumatic aortic stenosis with insufficiency: Secondary | ICD-10-CM

## 2017-12-15 DIAGNOSIS — B952 Enterococcus as the cause of diseases classified elsewhere: Secondary | ICD-10-CM

## 2017-12-15 DIAGNOSIS — R29898 Other symptoms and signs involving the musculoskeletal system: Secondary | ICD-10-CM

## 2017-12-15 DIAGNOSIS — R7881 Bacteremia: Secondary | ICD-10-CM | POA: Diagnosis not present

## 2017-12-15 NOTE — Progress Notes (Signed)
Subjective:    Patient ID: Raymond Swanson, male    DOB: 10/14/1924, 84 y.o.   MRN: [id number]  HPI  84 year old patient who was hospitalized last month and treated for enterococcal bacteremia.  He was treated the prior month with TURBT For a large papillary bladder tumor.  Following this admission he was discharged to a skilled nursing facility for 7 to 10 days and has been home for less than 1 week. He was discharged on oxygen which she has self discontinued he has a home oximeter and states that his O2 saturations are always greater than 90.  O2 saturation here in the office today 99%;  he was placed on Seroquel during his skilled nursing home stay.  He states that he remains weak but seems very functional.  He is ambulatory with a cane and has been to his cardiologist office to set up a follow-up appointment and also has been shopping at [employer] earlier today.  He resides with his wife at home   Past Medical History:  Diagnosis Date  . ACUT GASTR ULCER W/HEMORR W/O MENTION OBST 09/10/2009  . ANEMIA 10/08/2009  . Cancer ([place])   . CORONARY ARTERY DISEASE 01/26/2007  . Fall 2016   fall from deck while cleaning gutters; sustained subdemral hematoma ; doing ok now   . History of kidney stones   . HOH (hard of hearing)   . HYPERLIPIDEMIA 01/26/2007  . HYPERTENSION 01/26/2007  . HYPOTHYROIDISM 10/18/2007  . MELENA 08/28/2009  . PROSTATE CANCER, HX OF 01/26/2007  . SKIN CANCER, HX OF 01/26/2007     Social History   Socioeconomic History  . Marital status: Married    Spouse name: Not on file  . Number of children: Not on file  . Years of education: Not on file  . Highest education level: Not on file  Occupational History  . Not on file  Social Needs  . Financial resource strain: Not on file  . Food insecurity:    Worry: Not on file    Inability: Not on file  . Transportation needs:    Medical: Not on file    Non-medical: Not on file  Tobacco Use  . Smoking status: Former Smoker   Packs/day: 1.00    Years: 20.00    Pack years: 20.00    Types: Cigarettes    Last attempt to quit: 10/28/1948    Years since quitting: 69.1  . Smokeless tobacco: Never Used  Substance and Sexual Activity  . Alcohol use: Yes    Comment: 4 oz. wine daily  . Drug use: No  . Sexual activity: Not on file  Lifestyle  . Physical activity:    Days per week: Not on file    Minutes per session: Not on file  . Stress: Not on file  Relationships  . Social connections:    Talks on phone: Not on file    Gets together: Not on file    Attends religious service: Not on file    Active member of club or organization: Not on file    Attends meetings of clubs or organizations: Not on file    Relationship status: Not on file  . Intimate partner violence:    Fear of current or ex partner: Not on file    Emotionally abused: Not on file    Physically abused: Not on file    Forced sexual activity: Not on file  Other Topics Concern  . Not on file  Social History  Narrative  . Not on file    Past Surgical History:  Procedure Laterality Date  . CATARACT EXTRACTION    . CORONARY ARTERY BYPASS GRAFT     1991; reports at PAT appt 10-11-17: i went to my priamry doctor for a physical and had aroutine stress test that was bad, was sent for cath ( more than 1 but nsure how many or if stents were placed), he rprots " that didnt work so they did the surgery". denies heart symptoms for over 20 years;  sees  cardiology Hochrein annually for EKGs   . CYSTOSCOPY W/ URETERAL STENT PLACEMENT Right 10/13/2017   Procedure: CYSTOSCOPY WITH STENT REPLACEMENT;  Surgeon: Ceasar Mons, MD;  [facility];  Service: Urology;  Laterality: Right;  . EXCISION MASS UPPER EXTREMETIES Left 08/11/2016   Procedure: EXCISION MASS LEFT SHOULDER, LEFT FOREARM, LEFT WRIST;  Surgeon: Judeth Horn, MD;  Location: [place] [facility];  Service: General;  Laterality: Left;  . HERNIA REPAIR     ingunial  . MOHS SURGERY    . PROSTATE SURGERY      prostatectomy  . TEE WITHOUT CARDIOVERSION N/A 11/17/2017   Procedure: TRANSESOPHAGEAL ECHOCARDIOGRAM (TEE);  Surgeon: Dixie Dials, MD;  Location: [facility] ENDOSCOPY;  Service: Cardiovascular;  Laterality: N/A;  . TRANSURETHRAL RESECTION OF BLADDER TUMOR N/A 10/13/2017   Procedure: TRANSURETHRAL RESECTION OF BLADDER TUMOR/ BIPOLAR (TURBT);  Surgeon: Ceasar Mons, MD;  [facility];  Service: Urology;  Laterality: N/A;  ONLY NEEDS 90 MIN FOR BOTH PROCEDURES    Family History  Problem Relation Age of Onset  . Lung cancer Brother 82  . GI problems Sister     No Known Allergies  Current Outpatient Medications on File Prior to Visit  Medication Sig Dispense Refill  . aspirin EC 81 MG tablet Take 81 mg by mouth daily.    . carvedilol (COREG) 3.125 MG tablet Take 1 tablet (3.125 mg total) by mouth 2 (two) times daily with a meal. 60 tablet 1  . furosemide (LASIX) 40 MG tablet Take 0.5 tablets (20 mg total) by mouth daily. 30 tablet 0  . levothyroxine (SYNTHROID, LEVOTHROID) 50 MCG tablet Take 1 tablet (50 mcg total) by mouth daily. 90 tablet 4  . nitroGLYCERIN (NITROSTAT) 0.4 MG SL tablet Place 1 tablet (0.4 mg total) under the tongue every 5 (five) minutes as needed. (Patient taking differently: Place 0.4 mg under the tongue every 5 (five) minutes as needed for chest pain. ) 20 tablet 5  . simvastatin (ZOCOR) 20 MG tablet Take 1 tablet (20 mg total) by mouth daily. (Patient taking differently: Take 20 mg by mouth every evening. ) 90 tablet 3   No current facility-administered medications on file prior to visit.     BP 110/60 (BP Location: Left Arm, Patient Position: Sitting, Cuff Size: Normal)   Pulse 95   Temp 98.4 F (36.9 C) (Oral)   Wt 131 lb (59.4 kg)   SpO2 99%   BMI 18.80 kg/m     Review of Systems  Constitutional: Positive for fatigue. Negative for appetite change, chills and fever.  HENT: Positive for hearing loss. Negative for congestion, dental problem,  ear pain, sore throat, tinnitus, trouble swallowing and voice change.   Eyes: Negative for pain, discharge and visual disturbance.  Respiratory: Negative for cough, chest tightness, wheezing and stridor.   Cardiovascular: Negative for chest pain, palpitations and leg swelling.  Gastrointestinal: Negative for abdominal distention, abdominal pain, blood in stool, constipation, diarrhea, nausea and  vomiting.  Genitourinary: Negative for difficulty urinating, discharge, flank pain, genital sores, hematuria and urgency.  Musculoskeletal: Negative for arthralgias, back pain, gait problem, joint swelling, myalgias and neck stiffness.  Skin: Negative for rash.  Neurological: Positive for weakness. Negative for dizziness, syncope, speech difficulty, numbness and headaches.  Hematological: Negative for adenopathy. Does not bruise/bleed easily.  Psychiatric/Behavioral: Negative for behavioral problems and dysphoric mood. The patient is not nervous/anxious.        Objective:   Physical Exam  Constitutional: He is oriented to person, place, and time. He appears well-developed. No distress.  Alert Blood pressure stable O2 saturation 99% Afebrile  HENT:  Head: Normocephalic.  Right Ear: External ear normal.  Left Ear: External ear normal.  Eyes: Conjunctivae and EOM are normal.  Neck: Normal range of motion.  Cardiovascular: Normal rate.  Murmur heard. Pulmonary/Chest: He has rales.  Few pulmonary crackles  Abdominal: Bowel sounds are normal.  Musculoskeletal: Normal range of motion. He exhibits no edema or tenderness.  Neurological: He is alert and oriented to person, place, and time.  Psychiatric: He has a normal mood and affect. His behavior is normal.  Somewhat agitated          Assessment & Plan:   Status post enterococcal bacteremia Status post TUR BT for bladder neoplasm.  Follow-up urology Essential hypertension Hypothyroidism  Discontinue Seroquel Discontinue oxygen  therapy Follow with urology  Referral placed for advanced home care to assist with home PT  Return in 3 months for follow-up  Nyoka Cowden

## 2017-12-15 NOTE — Patient Instructions (Signed)
Okay to discontinue oxygen therapy  [facility] home [facility] referral will be scheduled  Cardiology follow-up as scheduled  Return in 3 months for follow-up or as needed

## 2017-12-16 ENCOUNTER — Telehealth: Payer: Self-pay

## 2017-12-16 NOTE — Telephone Encounter (Signed)
Patient called stating that he wanted to cancel his appointment on 12/21/2017 at 1100 with [facility] because he does not know who she is or why he has this appointment. This nurse explained to him that the appointment was for a hospital follow up from a recent hospital stay in March. Patient was addiment about canceling this appointment so this nurse granted his request and let him know that he can call us if needed. Pola Corn, LPN

## 2017-12-16 NOTE — Telephone Encounter (Signed)
Sounds like he is feeling better :) I appreciate him calling to cancel.

## 2017-12-20 ENCOUNTER — Telehealth: Payer: Self-pay | Admitting: Internal Medicine

## 2017-12-20 NOTE — Telephone Encounter (Signed)
Copied from [place] [id number]. Topic: Quick Communication - See Telephone Encounter >> Dec 20, 2017  3:03 PM Vernona Rieger wrote: CRM for notification. See Telephone encounter for: 12/20/17.  [employer] called and said that he contacted and told them that the Prior Authorization was no longer needed for the oxygen therapy.  She said that Dr Raliegh Ip needs to call and give them that information if he is no longer using the oxygen & call advance home care and let them know as well Call back is [phone] for [place] fax number for advance home care is [phone]

## 2017-12-21 ENCOUNTER — Inpatient Hospital Stay: Payer: Medicare Other | Admitting: Infectious Diseases

## 2017-12-22 NOTE — Telephone Encounter (Signed)
Noted  

## 2017-12-29 NOTE — Progress Notes (Signed)
HPI The patient presents for follow up of CAD.   Since I last saw him he was in the hospital with enterococcus bacteremia.   He had a TEE and he was not found to have endocarditis.  I have reviewed these hospital records.  The patient was very angry today.  He did not understand the whole hospitalization or the requirement for oxygen when he left or therapy.  He said he not seen any physicians or very few during his hospitalization.  However, when I review his hospitalization there was some hospital delirium and agitation.  He had to be treated with Haldol.  On further questioning he does not remember procedure like the TEE.  There is clearly some confusion.  Remarkably he says he feels very well.  He denies any chest pressure, neck or arm discomfort.  Is not having any palpitations, presyncope or syncope.  He has no shortness of breath, PND or orthopnea.  He has some weakness but he thinks this is baseline.    He has not had any fevers or chills.  No Known Allergies  Current Outpatient Medications  Medication Sig Dispense Refill  . aspirin EC 81 MG tablet Take 81 mg by mouth daily.    . carvedilol (COREG) 3.125 MG tablet Take 1 tablet (3.125 mg total) by mouth 2 (two) times daily with a meal. 60 tablet 1  . furosemide (LASIX) 40 MG tablet Take 0.5 tablets (20 mg total) by mouth daily. 30 tablet 0  . levothyroxine (SYNTHROID, LEVOTHROID) 50 MCG tablet Take 1 tablet (50 mcg total) by mouth daily. 90 tablet 4  . nitroGLYCERIN (NITROSTAT) 0.4 MG SL tablet Place 1 tablet (0.4 mg total) under the tongue every 5 (five) minutes as needed. (Patient taking differently: Place 0.4 mg under the tongue every 5 (five) minutes as needed for chest pain. ) 20 tablet 5  . simvastatin (ZOCOR) 20 MG tablet Take 1 tablet (20 mg total) by mouth daily. (Patient taking differently: Take 20 mg by mouth every evening. ) 90 tablet 3   No current facility-administered medications for this visit.     Past Medical  History:  Diagnosis Date  . ACUT GASTR ULCER W/HEMORR W/O MENTION OBST 09/10/2009  . ANEMIA 10/08/2009  . Cancer ([place])   . CORONARY ARTERY DISEASE 01/26/2007  . Fall 2016   fall from deck while cleaning gutters; sustained subdemral hematoma ; doing ok now   . History of kidney stones   . HOH (hard of hearing)   . HYPERLIPIDEMIA 01/26/2007  . HYPERTENSION 01/26/2007  . HYPOTHYROIDISM 10/18/2007  . MELENA 08/28/2009  . PROSTATE CANCER, HX OF 01/26/2007  . SKIN CANCER, HX OF 01/26/2007    Past Surgical History:  Procedure Laterality Date  . CATARACT EXTRACTION    . CORONARY ARTERY BYPASS GRAFT     1991; reports at PAT appt 10-11-17: i went to my priamry doctor for a physical and had aroutine stress test that was bad, was sent for cath ( more than 1 but nsure how many or if stents were placed), he rprots " that didnt work so they did the surgery". denies heart symptoms for over 20 years;  sees  cardiology Finnis Colee annually for EKGs   . CYSTOSCOPY W/ URETERAL STENT PLACEMENT Right 10/13/2017   Procedure: CYSTOSCOPY WITH STENT REPLACEMENT;  Surgeon: Ceasar Mons, MD;  [facility];  Service: Urology;  Laterality: Right;  . EXCISION MASS UPPER EXTREMETIES Left 08/11/2016   Procedure: EXCISION MASS LEFT  SHOULDER, LEFT FOREARM, LEFT WRIST;  Surgeon: Judeth Horn, MD;  Location: [place];  Service: General;  Laterality: Left;  . HERNIA REPAIR     ingunial  . MOHS SURGERY    . PROSTATE SURGERY     prostatectomy  . TEE WITHOUT CARDIOVERSION N/A 11/17/2017   Procedure: TRANSESOPHAGEAL ECHOCARDIOGRAM (TEE);  Surgeon: Dixie Dials, MD;  Location: [facility] ENDOSCOPY;  Service: Cardiovascular;  Laterality: N/A;  . TRANSURETHRAL RESECTION OF BLADDER TUMOR N/A 10/13/2017   Procedure: TRANSURETHRAL RESECTION OF BLADDER TUMOR/ BIPOLAR (TURBT);  Surgeon: Ceasar Mons, MD;  [facility];  Service: Urology;  Laterality: N/A;  ONLY NEEDS 90 MIN FOR BOTH PROCEDURES    ROS:  Decreased hearing.   Otherwise as stated in the HPI and negative for all other systems.    PHYSICAL EXAM BP 137/66   Pulse 80   Ht 5\' 10"  (1.778 m)   Wt 131 lb 6.4 oz (59.6 kg)   BMI 18.85 kg/m   GENERAL:  Well appearing NECK:  No jugular venous distention, waveform within normal limits, carotid upstroke brisk and symmetric, no bruits, no thyromegaly LUNGS:  Clear to auscultation bilaterally CHEST:  Unremarkable HEART:  PMI not displaced or sustained,S1 and S2 within normal limits, no S3, no S4, no clicks, no rubs, no murmurs ABD:  Flat, positive bowel sounds normal in frequency in pitch, no bruits, no rebound, no guarding, no midline pulsatile mass, no hepatomegaly, no splenomegaly EXT:  2 plus pulses throughout, no edema, no cyanosis no clubbing   EKG:  NA   Lab Results  Component Value Date   CHOL 94 11/14/2017   TRIG 43 11/14/2017   HDL 43 11/14/2017   LDLCALC 42 11/14/2017    ASSESSMENT AND PLAN  CORONARY ARTERY DISEASE -  The patient has no new sypmtoms.  No further cardiovascular testing is indicated.  We will continue with aggressive risk reduction and meds as listed.  HYPERTENSION -  The blood pressure is at target. No change in medications is indicated. We will continue with therapeutic lifestyle changes (TLC).  HYPERLIPIDEMIA -  LDL is excellent as above.  No change in therapy.   BACTEREMIA:  I reveiewed extensively over the patient's hospital records with him and reviewed that he did indeed have bacteria on blood cultures but was treated and had follow-up blood cultures that were negative.  I discussed with him the results of his TEE.  He was quite agitated about the process but seems to be in a better place on the left.  ( (Greater than 18minutes reviewing all data with greater than 50% face to face with the patient).

## 2017-12-30 ENCOUNTER — Encounter: Payer: Self-pay | Admitting: Cardiology

## 2017-12-30 ENCOUNTER — Ambulatory Visit: Payer: Medicare Other | Admitting: Cardiology

## 2017-12-30 VITALS — BP 137/66 | HR 80 | Ht 70.0 in | Wt 131.4 lb

## 2017-12-30 DIAGNOSIS — E785 Hyperlipidemia, unspecified: Secondary | ICD-10-CM | POA: Diagnosis not present

## 2017-12-30 DIAGNOSIS — I2510 Atherosclerotic heart disease of native coronary artery without angina pectoris: Secondary | ICD-10-CM | POA: Diagnosis not present

## 2017-12-30 DIAGNOSIS — I10 Essential (primary) hypertension: Secondary | ICD-10-CM

## 2017-12-30 NOTE — Patient Instructions (Signed)
Medication Instructions:  Continue current medications  If you need a refill on your cardiac medications before your next appointment, please call your pharmacy.  Labwork: None Ordered   Testing/Procedures: None Ordered  Follow-Up: Your physician wants you to follow-up in: 1 Year. You should receive a reminder letter in the mail two months in advance. If you do not receive a letter, please call our office [phone].    Thank you for choosing [facility]!!      

## 2018-02-02 ENCOUNTER — Encounter (INDEPENDENT_AMBULATORY_CARE_PROVIDER_SITE_OTHER): Payer: Medicare Other | Admitting: Ophthalmology

## 2018-02-02 DIAGNOSIS — H43813 Vitreous degeneration, bilateral: Secondary | ICD-10-CM

## 2018-02-02 DIAGNOSIS — H353112 Nonexudative age-related macular degeneration, right eye, intermediate dry stage: Secondary | ICD-10-CM | POA: Diagnosis not present

## 2018-02-02 DIAGNOSIS — H35033 Hypertensive retinopathy, bilateral: Secondary | ICD-10-CM

## 2018-02-02 DIAGNOSIS — H353221 Exudative age-related macular degeneration, left eye, with active choroidal neovascularization: Secondary | ICD-10-CM | POA: Diagnosis not present

## 2018-02-02 DIAGNOSIS — I10 Essential (primary) hypertension: Secondary | ICD-10-CM

## 2018-03-02 ENCOUNTER — Encounter (INDEPENDENT_AMBULATORY_CARE_PROVIDER_SITE_OTHER): Payer: Medicare Other | Admitting: Ophthalmology

## 2018-03-02 DIAGNOSIS — H353221 Exudative age-related macular degeneration, left eye, with active choroidal neovascularization: Secondary | ICD-10-CM

## 2018-03-02 DIAGNOSIS — H35033 Hypertensive retinopathy, bilateral: Secondary | ICD-10-CM

## 2018-03-02 DIAGNOSIS — I10 Essential (primary) hypertension: Secondary | ICD-10-CM | POA: Diagnosis not present

## 2018-03-02 DIAGNOSIS — H43813 Vitreous degeneration, bilateral: Secondary | ICD-10-CM

## 2018-03-02 DIAGNOSIS — H353112 Nonexudative age-related macular degeneration, right eye, intermediate dry stage: Secondary | ICD-10-CM | POA: Diagnosis not present

## 2018-03-15 ENCOUNTER — Encounter: Payer: Self-pay | Admitting: Internal Medicine

## 2018-03-15 ENCOUNTER — Ambulatory Visit: Payer: Medicare Other | Admitting: Internal Medicine

## 2018-03-15 VITALS — BP 124/82 | HR 76 | Temp 97.6°F | Wt 135.8 lb

## 2018-03-15 DIAGNOSIS — I2510 Atherosclerotic heart disease of native coronary artery without angina pectoris: Secondary | ICD-10-CM | POA: Diagnosis not present

## 2018-03-15 DIAGNOSIS — I352 Nonrheumatic aortic (valve) stenosis with insufficiency: Secondary | ICD-10-CM | POA: Diagnosis not present

## 2018-03-15 DIAGNOSIS — I10 Essential (primary) hypertension: Secondary | ICD-10-CM | POA: Diagnosis not present

## 2018-03-15 DIAGNOSIS — E038 Other specified hypothyroidism: Secondary | ICD-10-CM | POA: Diagnosis not present

## 2018-03-15 DIAGNOSIS — D649 Anemia, unspecified: Secondary | ICD-10-CM

## 2018-03-15 LAB — COMPREHENSIVE METABOLIC PANEL
ALBUMIN: 4.1 g/dL (ref 3.5–5.2)
ALT: 17 U/L (ref 0–53)
AST: 20 U/L (ref 0–37)
Alkaline Phosphatase: 82 U/L (ref 39–117)
BUN: 23 mg/dL (ref 6–23)
CALCIUM: 9.6 mg/dL (ref 8.4–10.5)
CO2: 30 mEq/L (ref 19–32)
Chloride: 105 mEq/L (ref 96–112)
Creatinine, Ser: 1.15 mg/dL (ref 0.40–1.50)
GFR: 63.28 mL/min (ref >60.00)
Glucose, Bld: 90 mg/dL (ref 70–99)
POTASSIUM: 4.5 meq/L (ref 3.5–5.1)
Sodium: 142 mEq/L (ref 135–145)
TOTAL PROTEIN: 6.9 g/dL (ref 6.0–8.3)
Total Bilirubin: 0.5 mg/dL (ref 0.2–1.2)

## 2018-03-15 LAB — CBC WITH DIFFERENTIAL/PLATELET
Basophils Absolute: 0 10*3/uL (ref 0.0–0.1)
Basophils Relative: 0.6 % (ref 0.0–3.0)
EOS PCT: 3.1 % (ref 0.0–5.0)
Eosinophils Absolute: 0.2 10*3/uL (ref 0.0–0.7)
HCT: 34.9 % — ABNORMAL LOW (ref 39.0–52.0)
HEMOGLOBIN: 10.8 g/dL — AB (ref 13.0–17.0)
Lymphocytes Relative: 17.9 % (ref 12.0–46.0)
Lymphs Abs: 1.2 10*3/uL (ref 0.7–4.0)
MCHC: 31 g/dL (ref 30.0–36.0)
MCV: 77.5 fl — AB (ref 78.0–100.0)
MONO ABS: 0.7 10*3/uL (ref 0.1–1.0)
MONOS PCT: 10.6 % (ref 3.0–12.0)
Neutro Abs: 4.4 10*3/uL (ref 1.4–7.7)
Neutrophils Relative %: 67.8 % (ref 43.0–77.0)
Platelets: 231 10*3/uL (ref 150.0–400.0)
RBC: 4.51 Mil/uL (ref 4.22–5.81)
RDW: 18 % — ABNORMAL HIGH (ref 11.5–15.5)
WBC: 6.5 10*3/uL (ref 4.0–10.5)

## 2018-03-15 LAB — TSH: TSH: 7.68 u[IU]/mL — ABNORMAL HIGH (ref 0.35–4.50)

## 2018-03-15 NOTE — Patient Instructions (Signed)
Limit your sodium (Salt) intake  Return in 6 weeks for follow-up  Call or return to clinic prn if these symptoms worsen or fail to improve as anticipated.

## 2018-03-15 NOTE — Progress Notes (Signed)
Subjective:    Patient ID: Raymond Swanson, male    DOB: 1924/12/24, 84 y.o.   MRN: [id number]  HPI  84 year old patient who is seen today for follow-up.  He has a history of essential hypertension. He was hospitalized earlier in the year with enterococcal bacteremia following urological instrumentation.  He has coronary artery disease as well as aortic stenosis. He has treated hypothyroidism. His chief complaint is poor energy level. He has been on oxygen therapy in the past but does monitor home oxygen saturations with ranges in the 94 to 96% O2 saturation here in the office is high as 99%  He states that he exercises regularly and spends 1 hour/day with activities  Past Medical History:  Diagnosis Date  . ACUT GASTR ULCER W/HEMORR W/O MENTION OBST 09/10/2009  . ANEMIA 10/08/2009  . Cancer ([place])   . CORONARY ARTERY DISEASE 01/26/2007  . Fall 2016   fall from deck while cleaning gutters; sustained subdemral hematoma ; doing ok now   . History of kidney stones   . HOH (hard of hearing)   . HYPERLIPIDEMIA 01/26/2007  . HYPERTENSION 01/26/2007  . HYPOTHYROIDISM 10/18/2007  . MELENA 08/28/2009  . PROSTATE CANCER, HX OF 01/26/2007  . SKIN CANCER, HX OF 01/26/2007     Social History   Socioeconomic History  . Marital status: Married    Spouse name: Not on file  . Number of children: Not on file  . Years of education: Not on file  . Highest education level: Not on file  Occupational History  . Not on file  Social Needs  . Financial resource strain: Not on file  . Food insecurity:    Worry: Not on file    Inability: Not on file  . Transportation needs:    Medical: Not on file    Non-medical: Not on file  Tobacco Use  . Smoking status: Former Smoker    Packs/day: 1.00    Years: 20.00    Pack years: 20.00    Types: Cigarettes    Last attempt to quit: 10/28/1948    Years since quitting: 69.4  . Smokeless tobacco: Never Used  Substance and Sexual Activity  . Alcohol use: Yes   Comment: 4 oz. wine daily  . Drug use: No  . Sexual activity: Not on file  Lifestyle  . Physical activity:    Days per week: Not on file    Minutes per session: Not on file  . Stress: Not on file  Relationships  . Social connections:    Talks on phone: Not on file    Gets together: Not on file    Attends religious service: Not on file    Active member of club or organization: Not on file    Attends meetings of clubs or organizations: Not on file    Relationship status: Not on file  . Intimate partner violence:    Fear of current or ex partner: Not on file    Emotionally abused: Not on file    Physically abused: Not on file    Forced sexual activity: Not on file  Other Topics Concern  . Not on file  Social History Narrative  . Not on file    Past Surgical History:  Procedure Laterality Date  . CATARACT EXTRACTION    . CORONARY ARTERY BYPASS GRAFT     1991; reports at PAT appt 10-11-17: i went to my priamry doctor for a physical and had aroutine stress test that  was bad, was sent for cath ( more than 1 but nsure how many or if stents were placed), he rprots " that didnt work so they did the surgery". denies heart symptoms for over 20 years;  sees  cardiology Hochrein annually for EKGs   . CYSTOSCOPY W/ URETERAL STENT PLACEMENT Right 10/13/2017   Procedure: CYSTOSCOPY WITH STENT REPLACEMENT;  Surgeon: Ceasar Mons, MD;  [facility];  Service: Urology;  Laterality: Right;  . EXCISION MASS UPPER EXTREMETIES Left 08/11/2016   Procedure: EXCISION MASS LEFT SHOULDER, LEFT FOREARM, LEFT WRIST;  Surgeon: Judeth Horn, MD;  Location: [place];  Service: General;  Laterality: Left;  . HERNIA REPAIR     ingunial  . MOHS SURGERY    . PROSTATE SURGERY     prostatectomy  . TEE WITHOUT CARDIOVERSION N/A 11/17/2017   Procedure: TRANSESOPHAGEAL ECHOCARDIOGRAM (TEE);  Surgeon: Dixie Dials, MD;  Location: [facility] ENDOSCOPY;  Service: Cardiovascular;  Laterality: N/A;  . TRANSURETHRAL  RESECTION OF BLADDER TUMOR N/A 10/13/2017   Procedure: TRANSURETHRAL RESECTION OF BLADDER TUMOR/ BIPOLAR (TURBT);  Surgeon: Ceasar Mons, MD;  [facility];  Service: Urology;  Laterality: N/A;  ONLY NEEDS 90 MIN FOR BOTH PROCEDURES    Family History  Problem Relation Age of Onset  . Lung cancer Brother 65  . GI problems Sister     No Known Allergies  Current Outpatient Medications on File Prior to Visit  Medication Sig Dispense Refill  . aspirin EC 81 MG tablet Take 81 mg by mouth daily.    . carvedilol (COREG) 3.125 MG tablet Take 1 tablet (3.125 mg total) by mouth 2 (two) times daily with a meal. 60 tablet 1  . furosemide (LASIX) 40 MG tablet Take 0.5 tablets (20 mg total) by mouth daily. 30 tablet 0  . levothyroxine (SYNTHROID, LEVOTHROID) 50 MCG tablet Take 1 tablet (50 mcg total) by mouth daily. 90 tablet 4  . nitroGLYCERIN (NITROSTAT) 0.4 MG SL tablet Place 1 tablet (0.4 mg total) under the tongue every 5 (five) minutes as needed. (Patient taking differently: Place 0.4 mg under the tongue every 5 (five) minutes as needed for chest pain. ) 20 tablet 5  . simvastatin (ZOCOR) 20 MG tablet Take 1 tablet (20 mg total) by mouth daily. (Patient taking differently: Take 20 mg by mouth every evening. ) 90 tablet 3   No current facility-administered medications on file prior to visit.     BP 124/82 (BP Location: Left Arm, Patient Position: Sitting, Cuff Size: Normal)   Pulse 76   Temp 97.6 F (36.4 C) (Oral)   Wt 135 lb 12.8 oz (61.6 kg)   BMI 19.49 kg/m     Review of Systems  Constitutional: Positive for fatigue. Negative for appetite change, chills and fever.  HENT: Negative for congestion, dental problem, ear pain, hearing loss, sore throat, tinnitus, trouble swallowing and voice change.   Eyes: Negative for pain, discharge and visual disturbance.  Respiratory: Negative for cough, chest tightness, wheezing and stridor.   Cardiovascular: Negative for chest pain,  palpitations and leg swelling.  Gastrointestinal: Negative for abdominal distention, abdominal pain, blood in stool, constipation, diarrhea, nausea and vomiting.  Genitourinary: Negative for difficulty urinating, discharge, flank pain, genital sores, hematuria and urgency.  Musculoskeletal: Negative for arthralgias, back pain, gait problem, joint swelling, myalgias and neck stiffness.  Skin: Negative for rash.  Neurological: Positive for weakness. Negative for dizziness, syncope, speech difficulty, numbness and headaches.  Hematological: Negative for adenopathy. Does not bruise/bleed  easily.  Psychiatric/Behavioral: Negative for behavioral problems and dysphoric mood. The patient is not nervous/anxious.        Objective:   Physical Exam  Constitutional: He is oriented to person, place, and time. He appears well-developed.  Elderly Frail.  No distress Blood pressure well controlled Weight 135.8 O2 saturation 99%  HENT:  Head: Normocephalic.  Right Ear: External ear normal.  Left Ear: External ear normal.  Eyes: Conjunctivae and EOM are normal.  Neck: Normal range of motion.  Cardiovascular: Normal rate.  Murmur heard. Grade 6-9-6 systolic murmur loudest at the base  Pulmonary/Chest: He has rales.  Few bibasilar crackles  Abdominal: Bowel sounds are normal.  Musculoskeletal: Normal range of motion. He exhibits no edema or tenderness.  Neurological: He is alert and oriented to person, place, and time.  Psychiatric: He has a normal mood and affect. His behavior is normal.          Assessment & Plan:   Fatigue.  Multifactorial clinically appears to be stable Essential hypertension well-controlled Aortic stenosis Hypothyroidism.  Will check a TSH History of enterococcal sepsis History of multiple squamous cell skin cancer  Review updated lab Follow-up 6 weeks  Marletta Lor

## 2018-03-23 ENCOUNTER — Telehealth: Payer: Self-pay | Admitting: Family Medicine

## 2018-03-23 ENCOUNTER — Other Ambulatory Visit: Payer: Self-pay

## 2018-03-23 MED ORDER — LEVOTHYROXINE SODIUM 50 MCG PO TABS: 50.0000 ug | ORAL_TABLET | Freq: Every day | ORAL | 4 refills | Status: DC

## 2018-03-23 NOTE — Telephone Encounter (Signed)
Pt Rx sent to pharmacy. Pt wife notified!

## 2018-03-23 NOTE — Telephone Encounter (Signed)
Copied from [place] [id number]. Topic: General - Other >> Mar 23, 2018  2:48 PM Yvette Rack wrote: Reason for CRM: pt calling stating that his levothyroxine 75 mcg #91 daily was never sent to pharmacy please give pt a call when its sent in

## 2018-04-01 MED ORDER — LEVOTHYROXINE SODIUM 75 MCG PO TABS: 75.0000 ug | ORAL_TABLET | Freq: Every day | ORAL | 3 refills | Status: DC

## 2018-04-01 NOTE — Addendum Note (Signed)
Addended by: Gwenyth Ober R on: 04/01/2018 12:16 PM   Modules accepted: Orders

## 2018-04-20 ENCOUNTER — Encounter (INDEPENDENT_AMBULATORY_CARE_PROVIDER_SITE_OTHER): Payer: Medicare Other | Admitting: Ophthalmology

## 2018-04-21 DIAGNOSIS — H353221 Exudative age-related macular degeneration, left eye, with active choroidal neovascularization: Secondary | ICD-10-CM | POA: Diagnosis not present

## 2018-04-21 DIAGNOSIS — H353112 Nonexudative age-related macular degeneration, right eye, intermediate dry stage: Secondary | ICD-10-CM | POA: Diagnosis not present

## 2018-04-21 DIAGNOSIS — Z961 Presence of intraocular lens: Secondary | ICD-10-CM | POA: Diagnosis not present

## 2018-04-21 DIAGNOSIS — H5022 Vertical strabismus, left eye: Secondary | ICD-10-CM | POA: Diagnosis not present

## 2018-04-21 DIAGNOSIS — H524 Presbyopia: Secondary | ICD-10-CM | POA: Diagnosis not present

## 2018-04-26 ENCOUNTER — Encounter (INDEPENDENT_AMBULATORY_CARE_PROVIDER_SITE_OTHER): Payer: Medicare Other | Admitting: Ophthalmology

## 2018-04-26 DIAGNOSIS — I10 Essential (primary) hypertension: Secondary | ICD-10-CM | POA: Diagnosis not present

## 2018-04-26 DIAGNOSIS — H353221 Exudative age-related macular degeneration, left eye, with active choroidal neovascularization: Secondary | ICD-10-CM | POA: Diagnosis not present

## 2018-04-26 DIAGNOSIS — H43813 Vitreous degeneration, bilateral: Secondary | ICD-10-CM

## 2018-04-26 DIAGNOSIS — H35033 Hypertensive retinopathy, bilateral: Secondary | ICD-10-CM

## 2018-04-26 DIAGNOSIS — H353114 Nonexudative age-related macular degeneration, right eye, advanced atrophic with subfoveal involvement: Secondary | ICD-10-CM | POA: Diagnosis not present

## 2018-05-10 ENCOUNTER — Encounter: Payer: Self-pay | Admitting: Internal Medicine

## 2018-05-10 ENCOUNTER — Ambulatory Visit: Payer: Medicare Other | Admitting: Internal Medicine

## 2018-05-10 VITALS — BP 102/68 | HR 50 | Temp 97.8°F | Wt 135.6 lb

## 2018-05-10 DIAGNOSIS — C674 Malignant neoplasm of posterior wall of bladder: Secondary | ICD-10-CM

## 2018-05-10 DIAGNOSIS — W19XXXD Unspecified fall, subsequent encounter: Secondary | ICD-10-CM

## 2018-05-10 DIAGNOSIS — I062 Rheumatic aortic stenosis with insufficiency: Secondary | ICD-10-CM

## 2018-05-10 DIAGNOSIS — E039 Hypothyroidism, unspecified: Secondary | ICD-10-CM | POA: Diagnosis not present

## 2018-05-10 DIAGNOSIS — Z23 Encounter for immunization: Secondary | ICD-10-CM | POA: Diagnosis not present

## 2018-05-10 DIAGNOSIS — I10 Essential (primary) hypertension: Secondary | ICD-10-CM | POA: Diagnosis not present

## 2018-05-10 DIAGNOSIS — R5383 Other fatigue: Secondary | ICD-10-CM

## 2018-05-10 DIAGNOSIS — G4700 Insomnia, unspecified: Secondary | ICD-10-CM

## 2018-05-10 NOTE — Patient Instructions (Signed)
Return in 3 months for follow-up  Continue to exercise as much as possible  Limit your sodium (Salt) intake

## 2018-05-10 NOTE — Progress Notes (Signed)
Subjective:    Patient ID: BROK STOCKING, male    DOB: [birth year], 84 y.o.   MRN: [id number]  HPI  84 year old patient who is seen today in follow-up. He continues to have some fatigue.  Some days are worse than others and often he can get about without a cane.  He continues to exercise regularly and no change in his exercise capacity. He continues to sleep poorly. Recent laboratory studies revealed a slightly elevated TSH and his medications adjusted.  He does have a difficult time taking his levothyroxine fasting.  He is considering BCG therapy for bladder cancer.  He is concerned about treatment causing a decline in his functional capacity.  Presently he and his wife still live independently  He is requesting [facility] for [facility]  Past Medical History:  Diagnosis Date  . ACUT GASTR ULCER W/HEMORR W/O MENTION OBST 09/10/2009  . ANEMIA 10/08/2009  . Cancer ([place])   . CORONARY ARTERY DISEASE 01/26/2007  . Fall 2016   fall from deck while cleaning gutters; sustained subdemral hematoma ; doing ok now   . History of kidney stones   . HOH (hard of hearing)   . HYPERLIPIDEMIA 01/26/2007  . HYPERTENSION 01/26/2007  . HYPOTHYROIDISM 10/18/2007  . MELENA 08/28/2009  . PROSTATE CANCER, HX OF 01/26/2007  . SKIN CANCER, HX OF 01/26/2007     Social History   Socioeconomic History  . Marital status: Married    Spouse name: Not on file  . Number of children: Not on file  . Years of education: Not on file  . Highest education level: Not on file  Occupational History  . Not on file  Social Needs  . Financial resource strain: Not on file  . Food insecurity:    Worry: Not on file    Inability: Not on file  . Transportation needs:    Medical: Not on file    Non-medical: Not on file  Tobacco Use  . Smoking status: Former Smoker    Packs/day: 1.00    Years: 20.00    Pack years: 20.00    Types: Cigarettes    Last attempt to quit: 10/28/1948    Years since quitting: 69.5  . Smokeless  tobacco: Never Used  Substance and Sexual Activity  . Alcohol use: Yes    Comment: 4 oz. wine daily  . Drug use: No  . Sexual activity: Not on file  Lifestyle  . Physical activity:    Days per week: Not on file    Minutes per session: Not on file  . Stress: Not on file  Relationships  . Social connections:    Talks on phone: Not on file    Gets together: Not on file    Attends religious service: Not on file    Active member of club or organization: Not on file    Attends meetings of clubs or organizations: Not on file    Relationship status: Not on file  . Intimate partner violence:    Fear of current or ex partner: Not on file    Emotionally abused: Not on file    Physically abused: Not on file    Forced sexual activity: Not on file  Other Topics Concern  . Not on file  Social History Narrative  . Not on file    Past Surgical History:  Procedure Laterality Date  . CATARACT EXTRACTION    . CORONARY ARTERY BYPASS GRAFT     1991; reports at PAT appt  10-11-17: i went to my priamry doctor for a physical and had aroutine stress test that was bad, was sent for cath ( more than 1 but nsure how many or if stents were placed), he rprots " that didnt work so they did the surgery". denies heart symptoms for over 20 years;  sees  cardiology Hochrein annually for EKGs   . CYSTOSCOPY W/ URETERAL STENT PLACEMENT Right 10/13/2017   Procedure: CYSTOSCOPY WITH STENT REPLACEMENT;  Surgeon: Ceasar Mons, MD;  Location: [facility];  Service: Urology;  Laterality: Right;  . EXCISION MASS UPPER EXTREMETIES Left 08/11/2016   Procedure: EXCISION MASS LEFT SHOULDER, LEFT FOREARM, LEFT WRIST;  Surgeon: Judeth Horn, MD;  Location: [place];  Service: General;  Laterality: Left;  . HERNIA REPAIR     ingunial  . MOHS SURGERY    . PROSTATE SURGERY     prostatectomy  . TEE WITHOUT CARDIOVERSION N/A 11/17/2017   Procedure: TRANSESOPHAGEAL ECHOCARDIOGRAM (TEE);  Surgeon: Dixie Dials, MD;  Location:  [facility] END[facility]COPY;  Service: Cardiovascular;  Laterality: N/A;  . TRANSURETHRAL RESECTION OF BLADDER TUMOR N/A 10/13/2017   Procedure: TRANSURETHRAL RESECTION OF BLADDER TUMOR/ BIPOLAR (TURBT);  Surgeon: Ceasar Mons, MD;  Location: [facility];  Service: Urology;  Laterality: N/A;  ONLY NEEDS 90 MIN FOR BOTH PROCEDURES    Family History  Problem Relation Age of Onset  . Lung cancer Brother 38  . GI problems Sister     No Known Allergies  Current Outpatient Medications on File Prior to Visit  Medication Sig Dispense Refill  . aspirin EC 81 MG tablet Take 81 mg by mouth daily.    . carvedilol (COREG) 3.125 MG tablet Take 1 tablet (3.125 mg total) by mouth 2 (two) times daily with a meal. 60 tablet 1  . furosemide (LASIX) 40 MG tablet Take 0.5 tablets (20 mg total) by mouth daily. 30 tablet 0  . levothyroxine (SYNTHROID, LEVOTHROID) 75 MCG tablet Take 1 tablet (75 mcg total) by mouth daily. 90 tablet 3  . nitroGLYCERIN (NITROSTAT) 0.4 MG SL tablet Place 1 tablet (0.4 mg total) under the tongue every 5 (five) minutes as needed. (Patient taking differently: Place 0.4 mg under the tongue every 5 (five) minutes as needed for chest pain. ) 20 tablet 5  . simvastatin (ZOCOR) 20 MG tablet Take 1 tablet (20 mg total) by mouth daily. (Patient taking differently: Take 20 mg by mouth every evening. ) 90 tablet 3   No current facility-administered medications on file prior to visit.     BP 102/68 (BP Location: Left Arm, Patient Position: Sitting, Cuff Size: Normal)   Pulse (!) 50   Temp 97.8 F (36.6 C) (Oral)   Wt 135 lb 9.6 oz (61.5 kg)   SpO2 100%   BMI 19.46 kg/m     Review of Systems  Constitutional: Positive for fatigue. Negative for appetite change, chills and fever.  HENT: Negative for congestion, dental problem, ear pain, hearing loss, sore throat, tinnitus, trouble swallowing and voice change.   Eyes: Negative for pain, discharge and visual disturbance.  Respiratory: Negative  for cough, chest tightness, wheezing and stridor.   Cardiovascular: Negative for chest pain, palpitations and leg swelling.  Gastrointestinal: Negative for abdominal distention, abdominal pain, blood in stool, constipation, diarrhea, nausea and vomiting.  Genitourinary: Negative for difficulty urinating, discharge, flank pain, genital sores, hematuria and urgency.  Musculoskeletal: Positive for gait problem. Negative for arthralgias, back pain, joint swelling, myalgias and neck stiffness.  Skin: Negative for  rash.  Neurological: Positive for weakness. Negative for dizziness, syncope, speech difficulty, numbness and headaches.  Hematological: Negative for adenopathy. Does not bruise/bleed easily.  Psychiatric/Behavioral: Negative for behavioral problems and dysphoric mood. The patient is not nervous/anxious.        Objective:   Physical Exam  Constitutional: He is oriented to person, place, and time. He appears well-developed. No distress.  Elderly Frail Extremely hard of hearing Vital signs stable  HENT:  Head: Normocephalic.  Right Ear: External ear normal.  Left Ear: External ear normal.  Eyes: Conjunctivae and EOM are normal.  Neck: Normal range of motion.  Cardiovascular: Normal rate.  Murmur heard. Pulmonary/Chest: He has rales.  Abdominal: Bowel sounds are normal.  Musculoskeletal: Normal range of motion. He exhibits no edema or tenderness.  Neurological: He is alert and oriented to person, place, and time.  Psychiatric: He has a normal mood and affect. His behavior is normal.          Assessment & Plan:   Essential hypertension well-controlled Fatigue multifactorial Insomnia Aortic insufficiency Hypothyroidism.  Levothyroxine dose adjusted  We will place referral for home health care assistance Medications updated Flu vaccine administered Follow-up with new provider in 3 months  Marletta Lor

## 2018-06-03 ENCOUNTER — Other Ambulatory Visit: Payer: Self-pay | Admitting: Internal Medicine

## 2018-06-16 DIAGNOSIS — R972 Elevated prostate specific antigen [PSA]: Secondary | ICD-10-CM | POA: Diagnosis not present

## 2018-06-16 DIAGNOSIS — C672 Malignant neoplasm of lateral wall of bladder: Secondary | ICD-10-CM | POA: Diagnosis not present

## 2018-06-16 DIAGNOSIS — C61 Malignant neoplasm of prostate: Secondary | ICD-10-CM | POA: Diagnosis not present

## 2018-06-16 LAB — PSA: PSA: 57.3

## 2018-06-20 ENCOUNTER — Encounter (INDEPENDENT_AMBULATORY_CARE_PROVIDER_SITE_OTHER): Payer: Medicare Other | Admitting: Ophthalmology

## 2018-06-20 DIAGNOSIS — I10 Essential (primary) hypertension: Secondary | ICD-10-CM | POA: Diagnosis not present

## 2018-06-20 DIAGNOSIS — H35033 Hypertensive retinopathy, bilateral: Secondary | ICD-10-CM

## 2018-06-20 DIAGNOSIS — H353112 Nonexudative age-related macular degeneration, right eye, intermediate dry stage: Secondary | ICD-10-CM

## 2018-06-20 DIAGNOSIS — H353221 Exudative age-related macular degeneration, left eye, with active choroidal neovascularization: Secondary | ICD-10-CM

## 2018-06-20 DIAGNOSIS — H43813 Vitreous degeneration, bilateral: Secondary | ICD-10-CM

## 2018-06-23 ENCOUNTER — Encounter: Payer: Self-pay | Admitting: Internal Medicine

## 2018-06-27 ENCOUNTER — Ambulatory Visit (INDEPENDENT_AMBULATORY_CARE_PROVIDER_SITE_OTHER): Payer: Medicare Other | Admitting: Family Medicine

## 2018-06-27 ENCOUNTER — Encounter: Payer: Self-pay | Admitting: Family Medicine

## 2018-06-27 DIAGNOSIS — Z5329 Procedure and treatment not carried out because of patient's decision for other reasons: Secondary | ICD-10-CM

## 2018-07-11 NOTE — Progress Notes (Signed)
Established Patient Office Visit     CC/Reason for Visit: To establish care with me as his new provider and also for follow-up on chronic medical conditions  HPI: Raymond Swanson is a 84 y.o. male who is coming in today for above mentioned reasons. Past Medical History is significant for: HTN, HLD, bladder tumor, hypothyroidism (synthroid dose increased from 50 to 75 in 9/19 due to elevated TSH).  He truly has no complaints this visit.  He states "I wanted to come in to meet you to make sure that I like to you before you became my new doctor".  His Synthroid dose was recently increased so he will have a TSH today.  He states that he has been easily fatigued but has been attributing this to old age.  Uses a cane for ambulation, sometimes a walker.  He also brings up the topic of advance care planning.  "I have lived a great life and I do not want to be kept alive just for the sake of being alive".   Past Medical/Surgical History: Past Medical History:  Diagnosis Date  . ACUT GASTR ULCER W/HEMORR W/O MENTION OBST 09/10/2009  . ANEMIA 10/08/2009  . Cancer ([place])   . CORONARY ARTERY DISEASE 01/26/2007  . Fall 2016   fall from deck while cleaning gutters; sustained subdemral hematoma ; doing ok now   . History of kidney stones   . HOH (hard of hearing)   . HYPERLIPIDEMIA 01/26/2007  . HYPERTENSION 01/26/2007  . HYPOTHYROIDISM 10/18/2007  . MELENA 08/28/2009  . PROSTATE CANCER, HX OF 01/26/2007  . SKIN CANCER, HX OF 01/26/2007    Past Surgical History:  Procedure Laterality Date  . CATARACT EXTRACTION    . CORONARY ARTERY BYPASS GRAFT     1991; reports at PAT appt 10-11-17: i went to my priamry doctor for a physical and had aroutine stress test that was bad, was sent for cath ( more than 1 but nsure how many or if stents were placed), he rprots " that didnt work so they did the surgery". denies heart symptoms for over 20 years;  sees  cardiology Hochrein annually for EKGs   . CYSTOSCOPY W/ URETERAL  STENT PLACEMENT Right 10/13/2017   Procedure: CYSTOSCOPY WITH STENT REPLACEMENT;  Surgeon: Ceasar Mons, MD;  [facility];  Service: Urology;  Laterality: Right;  . EXCISION MASS UPPER EXTREMETIES Left 08/11/2016   Procedure: EXCISION MASS LEFT SHOULDER, LEFT FOREARM, LEFT WRIST;  Surgeon: Judeth Horn, MD;  Location: [place];  Service: General;  Laterality: Left;  . HERNIA REPAIR     ingunial  . MOHS SURGERY    . PROSTATE SURGERY     prostatectomy  . TEE WITHOUT CARDIOVERSION N/A 11/17/2017   Procedure: TRANSESOPHAGEAL ECHOCARDIOGRAM (TEE);  Surgeon: Dixie Dials, MD;  Location: [facility];  Service: Cardiovascular;  Laterality: N/A;  . TRANSURETHRAL RESECTION OF BLADDER TUMOR N/A 10/13/2017   Procedure: TRANSURETHRAL RESECTION OF BLADDER TUMOR/ BIPOLAR (TURBT);  Surgeon: Ceasar Mons, MD;  [facility];  Service: Urology;  Laterality: N/A;  ONLY NEEDS 90 MIN FOR BOTH PROCEDURES    Social History:  reports that he quit smoking about 69 years ago. His smoking use included cigarettes. He has a 20.00 pack-year smoking history. He has never used smokeless tobacco. He reports that he drinks alcohol. He reports that he does not use drugs.  Allergies: No Known Allergies  Family History:  Family History  Problem Relation Age of Onset  .  Lung cancer Brother 44  . GI problems Sister      Current Outpatient Medications:  .  aspirin EC 81 MG tablet, Take 81 mg by mouth daily., Disp: , Rfl:  .  carvedilol (COREG) 3.125 MG tablet, Take 1 tablet (3.125 mg total) by mouth 2 (two) times daily with a meal., Disp: 60 tablet, Rfl: 1 .  furosemide (LASIX) 40 MG tablet, Take 0.5 tablets (20 mg total) by mouth daily., Disp: 30 tablet, Rfl: 0 .  levothyroxine (SYNTHROID, LEVOTHROID) 50 MCG tablet, TAKE 1 TABLET BY MOUTH ONCE DAILY, Disp: 90 tablet, Rfl: 0 .  levothyroxine (SYNTHROID, LEVOTHROID) 75 MCG tablet, Take 1 tablet (75 mcg total) by mouth daily., Disp: 90 tablet,  Rfl: 3 .  nitroGLYCERIN (NITROSTAT) 0.4 MG SL tablet, Place 1 tablet (0.4 mg total) under the tongue every 5 (five) minutes as needed. (Patient taking differently: Place 0.4 mg under the tongue every 5 (five) minutes as needed for chest pain. ), Disp: 20 tablet, Rfl: 5 .  simvastatin (ZOCOR) 20 MG tablet, Take 1 tablet (20 mg total) by mouth daily. (Patient taking differently: Take 20 mg by mouth every evening. ), Disp: 90 tablet, Rfl: 3  Review of Systems:  Constitutional: Denies fever, chills, diaphoresis, appetite change. HEENT: Denies photophobia, eye pain, redness, hearing loss, ear pain, congestion, sore throat, rhinorrhea, sneezing, mouth sores, trouble swallowing, neck pain, neck stiffness and tinnitus.   Respiratory: Denies SOB, DOE, cough, chest tightness,  and wheezing.   Cardiovascular: Denies chest pain, palpitations and leg swelling.  Gastrointestinal: Denies nausea, vomiting, abdominal pain, diarrhea, constipation, blood in stool and abdominal distention.  Genitourinary: Denies dysuria, urgency, frequency, hematuria, flank pain and difficulty urinating.  Endocrine: Denies: hot or cold intolerance, sweats, changes in hair or nails, polyuria, polydipsia. Musculoskeletal: Denies myalgias, back pain, joint swelling, arthralgias and gait problem.  Skin: Denies pallor, rash and wound.  Neurological: Denies dizziness, seizures, syncope, weakness, light-headedness, numbness and headaches.  Hematological: Denies adenopathy. Easy bruising, personal or family bleeding history  Psychiatric/Behavioral: Denies suicidal ideation, mood changes, confusion, nervousness, sleep disturbance and agitation    Physical Exam: Vitals:   07/12/18 1255  BP: 130/70  Pulse: 86  Temp: 98.1 F (36.7 C)  TempSrc: Oral  SpO2: 95%  Weight: 135 lb 8 oz (61.5 kg)    Body mass index is 19.44 kg/m.   Constitutional: NAD, calm, comfortable Eyes: PERRL, lids and conjunctivae normal ENMT: Mucous membranes  are moist. Posterior pharynx clear of any exudate or lesions. Poor dentition.  Neck: normal, supple, no masses, no thyromegaly Respiratory: clear to auscultation bilaterally, no wheezing, no crackles. Normal respiratory effort. No accessory muscle use.  Cardiovascular: Regular rate and rhythm, strong systolic ejection murmur, best heard at the right upper sternal border, no/ rubs / gallops. No extremity edema. 2+ pedal pulses. No carotid bruits.  Abdomen: no tenderness, no masses palpated. No hepatosplenomegaly. Bowel sounds positive.  Musculoskeletal: no clubbing / cyanosis. No joint deformity upper and lower extremities. Good ROM, no contractures. Normal muscle tone.  Skin: no rashes, lesions, ulcers. No induration Neurologic: CN 2-12 grossly intact. Sensation intact, DTR normal. Strength 5/5 in all 4.  Psychiatric: Normal judgment and insight. Alert and oriented x 3. Normal mood.    Impression and Plan:  Acquired hypothyroidism -For now continue synthroid 75.  -TSH today.  Dyslipidemia -On simvastatin 20. -Due for lipids. -Will obtain fasting on next visit.  Essential hypertension -Well-controlled -On Coreg 3.125 BID.  Advanced care planning -Patient has spontaneously  brought up this topic.  He point blank told me that he does not want to be kept alive simply for the sake of being alive.  He acknowledges that this would be difficult for his wife, but he believes that this is the right thing to do. -He would like to proceed with signing DNR paperwork.  This will be notarized in the office today. -We have spent approximately 18 minutes discussing this topic.  Fatigue, chronic -Being treated for hypothyroidism. -We will check vitamin B12 level for completeness sake. -We will try not to be overly invasive in diagnosis and treatment strategies in this elderly gentleman.       Patient Instructions  BEFORE YOU LEAVE: -Labs  We have ordered labs or studies at this visit. It  can take up to 1-2 weeks for results and processing. IF results require follow up or explanation, we will call you with instructions. Clinically stable results will be released to your [facility]. If you have not heard from [place] or cannot find your results in [facility] in 2 weeks please contact our office at [phone].  If you are not yet signed up for [facility], please consider signing up.    -follow up: 3 to 4 months      Estela Isaac Bliss, MD [facility]

## 2018-07-12 ENCOUNTER — Ambulatory Visit: Payer: Medicare Other | Admitting: Internal Medicine

## 2018-07-12 ENCOUNTER — Encounter: Payer: Self-pay | Admitting: Internal Medicine

## 2018-07-12 VITALS — BP 130/70 | HR 86 | Temp 98.1°F | Wt 135.5 lb

## 2018-07-12 DIAGNOSIS — E785 Hyperlipidemia, unspecified: Secondary | ICD-10-CM | POA: Diagnosis not present

## 2018-07-12 DIAGNOSIS — I352 Nonrheumatic aortic (valve) stenosis with insufficiency: Secondary | ICD-10-CM | POA: Diagnosis not present

## 2018-07-12 DIAGNOSIS — R5382 Chronic fatigue, unspecified: Secondary | ICD-10-CM

## 2018-07-12 DIAGNOSIS — I10 Essential (primary) hypertension: Secondary | ICD-10-CM | POA: Diagnosis not present

## 2018-07-12 DIAGNOSIS — Z7189 Other specified counseling: Secondary | ICD-10-CM

## 2018-07-12 DIAGNOSIS — E039 Hypothyroidism, unspecified: Secondary | ICD-10-CM | POA: Diagnosis not present

## 2018-07-12 LAB — BASIC METABOLIC PANEL
BUN: 27 mg/dL — ABNORMAL HIGH (ref 6–23)
CHLORIDE: 108 meq/L (ref 96–112)
CO2: 30 meq/L (ref 19–32)
Calcium: 9.1 mg/dL (ref 8.4–10.5)
Creatinine, Ser: 1.1 mg/dL (ref 0.40–1.50)
GFR: 66.56 mL/min (ref >60.00)
Glucose, Bld: 115 mg/dL — ABNORMAL HIGH (ref 70–99)
POTASSIUM: 4.3 meq/L (ref 3.5–5.1)
SODIUM: 145 meq/L (ref 135–145)

## 2018-07-12 LAB — CBC
HCT: 32.6 % — ABNORMAL LOW (ref 39.0–52.0)
Hemoglobin: 10.4 g/dL — ABNORMAL LOW (ref 13.0–17.0)
MCHC: 31.9 g/dL (ref 30.0–36.0)
MCV: 76.2 fl — ABNORMAL LOW (ref 78.0–100.0)
Platelets: 275 10*3/uL (ref 150.0–400.0)
RBC: 4.27 Mil/uL (ref 4.22–5.81)
RDW: 18.9 % — ABNORMAL HIGH (ref 11.5–15.5)
WBC: 6.7 10*3/uL (ref 4.0–10.5)

## 2018-07-12 LAB — VITAMIN B12: Vitamin B-12: 469 pg/mL (ref 211–911)

## 2018-07-12 LAB — TSH: TSH: 2.76 u[IU]/mL (ref 0.35–4.50)

## 2018-07-12 NOTE — Addendum Note (Signed)
Addended by: Lynnea Ferrier on: 07/12/2018 02:48 PM   Modules accepted: Orders

## 2018-07-12 NOTE — Patient Instructions (Signed)
BEFORE YOU LEAVE: -Labs  We have ordered labs or studies at this visit. It can take up to 1-2 weeks for results and processing. IF results require follow up or explanation, we will call you with instructions. Clinically stable results will be released to your [facility]. If you have not heard from [place] or cannot find your results in [facility] in 2 weeks please contact our office at [phone].  If you are not yet signed up for [facility] please consider signing up.    -follow up: 3 to 4 months

## 2018-07-22 ENCOUNTER — Other Ambulatory Visit: Payer: Self-pay | Admitting: Internal Medicine

## 2018-07-29 ENCOUNTER — Other Ambulatory Visit: Payer: Self-pay | Admitting: Internal Medicine

## 2018-08-03 ENCOUNTER — Telehealth: Payer: Self-pay | Admitting: *Deleted

## 2018-08-03 DIAGNOSIS — G2581 Restless legs syndrome: Secondary | ICD-10-CM

## 2018-08-03 NOTE — Telephone Encounter (Signed)
Left message on machine for patient to return our call CRM 

## 2018-08-03 NOTE — Telephone Encounter (Signed)
This is used for restless legs. When was this last prescribed? Who prescribed? What symptoms is he having that he feels he needs it? We did not discuss this issue at his last visit.

## 2018-08-03 NOTE — Telephone Encounter (Signed)
Pramipexole 0.125 mg, take 2 tabs by mouth two hours prior to bedtime.  [place] [phone]  Patient requesting refill.  This is not on the current medication list by is historical.  Last office visit 07/12/18.  Okay to refill?

## 2018-08-03 NOTE — Telephone Encounter (Signed)
Patient returned call and he says he takes the medication for restless legs and to help him sleep. He says that Dr. Raliegh Ip told him this would help. He says there was no doctor's name on the bottle, but he says Dr. Raliegh Ip ordered it last and the date refill until 07/08/18. According to the medication history, it was ordered last on 07/08/17 #180/4 refills. It was removed from his profile on 11/14/17, however he says he still takes the medicine.  [id number] [address], [place] [phone] (Phone) [phone] (Fax)

## 2018-08-04 MED ORDER — PRAMIPEXOLE DIHYDROCHLORIDE 0.25 MG PO TABS: 0.2500 mg | ORAL_TABLET | Freq: Every day | ORAL | 2 refills | Status: DC

## 2018-08-04 NOTE — Telephone Encounter (Signed)
Have given him 3 months worth. Changed dose from 0.125 to 0.25 mg; that way he just has to take 1 tab instead of 2. We will discuss this issue further at next visit.

## 2018-08-04 NOTE — Telephone Encounter (Signed)
Patient is aware 

## 2018-08-08 ENCOUNTER — Encounter (INDEPENDENT_AMBULATORY_CARE_PROVIDER_SITE_OTHER): Payer: Medicare Other | Admitting: Ophthalmology

## 2018-08-08 DIAGNOSIS — I10 Essential (primary) hypertension: Secondary | ICD-10-CM

## 2018-08-08 DIAGNOSIS — H43813 Vitreous degeneration, bilateral: Secondary | ICD-10-CM | POA: Diagnosis not present

## 2018-08-08 DIAGNOSIS — H353231 Exudative age-related macular degeneration, bilateral, with active choroidal neovascularization: Secondary | ICD-10-CM | POA: Diagnosis not present

## 2018-08-08 DIAGNOSIS — H35033 Hypertensive retinopathy, bilateral: Secondary | ICD-10-CM | POA: Diagnosis not present

## 2018-08-09 ENCOUNTER — Encounter: Payer: Self-pay | Admitting: Internal Medicine

## 2018-08-09 ENCOUNTER — Ambulatory Visit: Payer: Medicare Other | Admitting: Internal Medicine

## 2018-08-09 VITALS — BP 102/60 | HR 64 | Temp 97.5°F | Wt 135.1 lb

## 2018-08-09 DIAGNOSIS — E785 Hyperlipidemia, unspecified: Secondary | ICD-10-CM

## 2018-08-09 DIAGNOSIS — G2581 Restless legs syndrome: Secondary | ICD-10-CM

## 2018-08-09 NOTE — Patient Instructions (Signed)
-  It was nice seeing you today!  -Stop taking the simvastatin (zocor) which is the cholesterol medication to see if this helps with your leg aches. Please let me know if you become interested in pursuing formal physical therapy.  -Please schedule follow up in 4-6 months.

## 2018-08-09 NOTE — Progress Notes (Signed)
Established Patient Office Visit     CC/Reason for Visit: Muscle aches, concern with weakness and his balance  HPI: Raymond Swanson is a 84 y.o. male who is coming in today for the above mentioned reasons.  He saw him in the office 2 weeks ago for his transfer of care appointment.  At that point his TSH was checked as well as B12 given concerns for balance and falls, both were within acceptable range.  He comes in today concerned about muscle aches and spasms.  He states he has been out of his Requip which he takes for restless legs for the past week or so (a prescription was sent out to him last week).  He also has noticed that when he is sitting down in a chair at home his thigh muscles ache, has become more difficult for him to walk because of this.  He also questions whether we can assist him in getting rides to his doctor's appointments.   Past Medical/Surgical History: Past Medical History:  Diagnosis Date  . ACUT GASTR ULCER W/HEMORR W/O MENTION OBST 09/10/2009  . ANEMIA 10/08/2009  . Cancer ([place])   . CORONARY ARTERY DISEASE 01/26/2007  . Fall 2016   fall from deck while cleaning gutters; sustained subdemral hematoma ; doing ok now   . History of kidney stones   . HOH (hard of hearing)   . HYPERLIPIDEMIA 01/26/2007  . HYPERTENSION 01/26/2007  . HYPOTHYROIDISM 10/18/2007  . MELENA 08/28/2009  . PROSTATE CANCER, HX OF 01/26/2007  . SKIN CANCER, HX OF 01/26/2007    Past Surgical History:  Procedure Laterality Date  . CATARACT EXTRACTION    . CORONARY ARTERY BYPASS GRAFT     1991; reports at PAT appt 10-11-17: i went to my priamry doctor for a physical and had aroutine stress test that was bad, was sent for cath ( more than 1 but nsure how many or if stents were placed), he rprots " that didnt work so they did the surgery". denies heart symptoms for over 20 years;  sees  cardiology Hochrein annually for EKGs   . CYSTOSCOPY W/ URETERAL STENT PLACEMENT Right 10/13/2017   Procedure:  CYSTOSCOPY WITH STENT REPLACEMENT;  Surgeon: Ceasar Mons, MD;  Location: [facility];  Service: Urology;  Laterality: Right;  . EXCISION MASS UPPER EXTREMETIES Left 08/11/2016   Procedure: EXCISION MASS LEFT SHOULDER, LEFT FOREARM, LEFT WRIST;  Surgeon: Judeth Horn, MD;  Location: [place];  Service: General;  Laterality: Left;  . HERNIA REPAIR     ingunial  . MOHS SURGERY    . PROSTATE SURGERY     prostatectomy  . TEE WITHOUT CARDIOVERSION N/A 11/17/2017   Procedure: TRANSESOPHAGEAL ECHOCARDIOGRAM (TEE);  Surgeon: Dixie Dials, MD;  [facility];  Service: Cardiovascular;  Laterality: N/A;  . TRANSURETHRAL RESECTION OF BLADDER TUMOR N/A 10/13/2017   Procedure: TRANSURETHRAL RESECTION OF BLADDER TUMOR/ BIPOLAR (TURBT);  Surgeon: Ceasar Mons, MD;  [facility];  Service: Urology;  Laterality: N/A;  ONLY NEEDS 90 MIN FOR BOTH PROCEDURES    Social History:  reports that he quit smoking about 69 years ago. His smoking use included cigarettes. He has a 20.00 pack-year smoking history. He has never used smokeless tobacco. He reports current alcohol use. He reports that he does not use drugs.  Allergies: No Known Allergies  Family History:  Family History  Problem Relation Age of Onset  . Lung cancer Brother 20  . GI problems Sister  Current Outpatient Medications:  .  aspirin EC 81 MG tablet, Take 81 mg by mouth daily., Disp: , Rfl:  .  furosemide (LASIX) 40 MG tablet, Take 0.5 tablets (20 mg total) by mouth daily., Disp: 30 tablet, Rfl: 0 .  levothyroxine (SYNTHROID, LEVOTHROID) 75 MCG tablet, Take 1 tablet (75 mcg total) by mouth daily., Disp: 90 tablet, Rfl: 3 .  nitroGLYCERIN (NITROSTAT) 0.4 MG SL tablet, Place 1 tablet (0.4 mg total) under the tongue every 5 (five) minutes as needed. (Patient taking differently: Place 0.4 mg under the tongue every 5 (five) minutes as needed for chest pain. ), Disp: 20 tablet, Rfl: 5 .  pramipexole (MIRAPEX) 0.25 MG  tablet, Take 1 tablet (0.25 mg total) by mouth at bedtime., Disp: 30 tablet, Rfl: 2 .  carvedilol (COREG) 3.125 MG tablet, Take 1 tablet (3.125 mg total) by mouth 2 (two) times daily with a meal., Disp: 60 tablet, Rfl: 1  Review of Systems:  Constitutional: Denies fever, chills, diaphoresis, appetite change and fatigue.  HEENT: Denies photophobia, eye pain, redness, hearing loss, ear pain, congestion, sore throat, rhinorrhea, sneezing, mouth sores, trouble swallowing, neck pain, neck stiffness and tinnitus.   Respiratory: Denies SOB, DOE, cough, chest tightness,  and wheezing.   Cardiovascular: Denies chest pain, palpitations and leg swelling.  Gastrointestinal: Denies nausea, vomiting, abdominal pain, diarrhea, constipation, blood in stool and abdominal distention.  Genitourinary: Denies dysuria, urgency, frequency, hematuria, flank pain and difficulty urinating.  Endocrine: Denies: hot or cold intolerance, sweats, changes in hair or nails, polyuria, polydipsia. Musculoskeletal: Denies myalgias, back pain, joint swelling, arthralgias and gait problem.  Skin: Denies pallor, rash and wound.  Neurological: Denies dizziness, seizures, syncope, weakness, light-headedness, numbness and headaches.  Hematological: Denies adenopathy. Easy bruising, personal or family bleeding history  Psychiatric/Behavioral: Denies suicidal ideation, mood changes, confusion, nervousness, sleep disturbance and agitation    Physical Exam: Vitals:   08/09/18 1257  BP: 102/60  Pulse: 64  Temp: (!) 97.5 F (36.4 C)  TempSrc: Oral  SpO2: 97%  Weight: 135 lb 1.6 oz (61.3 kg)    Body mass index is 19.38 kg/m.    Constitutional: NAD, calm, comfortable year Eyes: PERRL, lids and conjunctivae normal ENMT: Mucous membranes are moist.  Neck: normal, supple, no masses, no thyromegaly Respiratory: clear to auscultation bilaterally, no wheezing, no crackles. Normal respiratory effort. No accessory muscle use.    Cardiovascular: Regular rate and rhythm, no murmurs / rubs / gallops. No extremity edema. 2+ pedal pulses. No carotid bruits.  Abdomen: no tenderness, no masses palpated. No hepatosplenomegaly. Bowel sounds positive.  Musculoskeletal: no clubbing / cyanosis. No joint deformity upper and lower extremities. Good ROM, no contractures. Normal muscle tone.  Skin: no rashes, lesions, ulcers. No induration Neurologic: Grossly intact and nonfocal Psychiatric: Normal judgment and insight. Alert and oriented x 3. Normal mood.    Impression and Plan:  Restless leg syndrome/myalgias -He had run out of his Requip last week before prescription was called in for him. -He thinks that this may be contributing to why his legs are aching. -He is also on a statin.  I believe that at age 89 he has probably outlived the benefit of a statin, as it may be contributing to some of his myalgias we will go ahead and discontinue at this time. -He is considering pursuing formal physical therapy but has not made a decision yet.     Patient Instructions  -It was nice seeing you today!  -Stop taking the simvastatin (zocor)  which is the cholesterol medication to see if this helps with your leg aches. Please let me know if you become interested in pursuing formal physical therapy.  -Please schedule follow up in 4-6 months.     Lelon Frohlich, MD [facility]

## 2018-09-19 ENCOUNTER — Encounter (INDEPENDENT_AMBULATORY_CARE_PROVIDER_SITE_OTHER): Payer: Medicare Other | Admitting: Ophthalmology

## 2018-09-19 DIAGNOSIS — H353231 Exudative age-related macular degeneration, bilateral, with active choroidal neovascularization: Secondary | ICD-10-CM

## 2018-09-19 DIAGNOSIS — H43813 Vitreous degeneration, bilateral: Secondary | ICD-10-CM

## 2018-09-19 DIAGNOSIS — I10 Essential (primary) hypertension: Secondary | ICD-10-CM | POA: Diagnosis not present

## 2018-09-19 DIAGNOSIS — H35033 Hypertensive retinopathy, bilateral: Secondary | ICD-10-CM | POA: Diagnosis not present

## 2018-10-25 NOTE — Progress Notes (Signed)
Error

## 2018-10-31 ENCOUNTER — Encounter (INDEPENDENT_AMBULATORY_CARE_PROVIDER_SITE_OTHER): Payer: Medicare Other | Admitting: Ophthalmology

## 2018-10-31 DIAGNOSIS — H35033 Hypertensive retinopathy, bilateral: Secondary | ICD-10-CM | POA: Diagnosis not present

## 2018-10-31 DIAGNOSIS — H353231 Exudative age-related macular degeneration, bilateral, with active choroidal neovascularization: Secondary | ICD-10-CM

## 2018-10-31 DIAGNOSIS — H43813 Vitreous degeneration, bilateral: Secondary | ICD-10-CM | POA: Diagnosis not present

## 2018-10-31 DIAGNOSIS — I10 Essential (primary) hypertension: Secondary | ICD-10-CM

## 2018-11-09 ENCOUNTER — Telehealth: Payer: Self-pay | Admitting: Internal Medicine

## 2018-11-09 DIAGNOSIS — G2581 Restless legs syndrome: Secondary | ICD-10-CM

## 2018-11-09 MED ORDER — PRAMIPEXOLE DIHYDROCHLORIDE 0.25 MG PO TABS: 0.2500 mg | ORAL_TABLET | Freq: Every day | ORAL | 1 refills | Status: DC

## 2018-11-09 NOTE — Telephone Encounter (Signed)
Copied from [place] [id number]. Topic: Quick Communication - Rx Refill/Question >> Nov 09, 2018 12:43 PM Scherrie Gerlach wrote: Medication: pramipexole (MIRAPEX) 0.25 MG tablet  pt states he called pharmacy 4 days. This helps him sleep, and he got no sleep last night.  Needs asap. [id number] [address], [place] [phone] (Phone) [phone] (Fax)

## 2018-11-24 ENCOUNTER — Telehealth: Payer: Self-pay

## 2018-11-24 NOTE — Telephone Encounter (Signed)
Author phoned pt. to assess interest in scheduling virtual awv. No answer, unable to leave VM. Pt. Has appointment with PCP 4/14. CMA to reach out to pt. To set up virtually if possible and/or reschedule.

## 2018-12-06 ENCOUNTER — Ambulatory Visit (INDEPENDENT_AMBULATORY_CARE_PROVIDER_SITE_OTHER): Payer: Medicare Other | Admitting: Internal Medicine

## 2018-12-06 ENCOUNTER — Other Ambulatory Visit: Payer: Self-pay

## 2018-12-06 DIAGNOSIS — E785 Hyperlipidemia, unspecified: Secondary | ICD-10-CM | POA: Diagnosis not present

## 2018-12-06 DIAGNOSIS — I10 Essential (primary) hypertension: Secondary | ICD-10-CM | POA: Diagnosis not present

## 2018-12-06 DIAGNOSIS — E039 Hypothyroidism, unspecified: Secondary | ICD-10-CM

## 2018-12-06 DIAGNOSIS — C674 Malignant neoplasm of posterior wall of bladder: Secondary | ICD-10-CM | POA: Diagnosis not present

## 2018-12-06 DIAGNOSIS — G2581 Restless legs syndrome: Secondary | ICD-10-CM

## 2018-12-06 NOTE — Progress Notes (Signed)
Virtual Visit via Telephone Note  I connected with Raymond Swanson on 12/06/18 at  1:00 PM EDT by telephone and verified that I am speaking with the correct person using two identifiers.   I discussed the limitations, risks, security and privacy concerns of performing an evaluation and management service by telephone and the availability of in person appointments. I also discussed with the patient that there may be a patient responsible charge related to this service. The patient expressed understanding and agreed to proceed.  We initially attempted to connect via video chat but were unable to due to technical difficulties on the patient's end, so we converted this visit to a phone visit.   Location patient: home Location provider: work office Participants present for the call: patient, provider Patient did not have a visit in the prior 7 days to address this/these issue(s).   History of Present Illness:  This is a scheduled visit for follow up of chronic conditions.  He is a very pleasant 85 yr old man. He complains of decreased energy and is still having issues with balance that concern him (he wants to get rid of his walker). For the past 2 weeks has been having a runny nose and a cough that is "90% better" with mucinex. Has not had fever or SOB. No recent travel or sick contacts. His RLS have been bothering him more than usual. He is due to see [facility] next week for f/u on his bladder tumor.   Observations/Objective: Patient sounds cheerful and well on the phone. I do not appreciate any increased work of breathing. Speech and thought processing are grossly intact. Patient reported vitals: none reported   Current Outpatient Medications:  .  aspirin EC 81 MG tablet, Take 81 mg by mouth daily., Disp: , Rfl:  .  carvedilol (COREG) 3.125 MG tablet, Take 1 tablet (3.125 mg total) by mouth 2 (two) times daily with a meal., Disp: 60 tablet, Rfl: 1 .  furosemide (LASIX) 40 MG tablet, Take  0.5 tablets (20 mg total) by mouth daily., Disp: 30 tablet, Rfl: 0 .  levothyroxine (SYNTHROID, LEVOTHROID) 75 MCG tablet, Take 1 tablet (75 mcg total) by mouth daily., Disp: 90 tablet, Rfl: 3 .  nitroGLYCERIN (NITROSTAT) 0.4 MG SL tablet, Place 1 tablet (0.4 mg total) under the tongue every 5 (five) minutes as needed. (Patient taking differently: Place 0.4 mg under the tongue every 5 (five) minutes as needed for chest pain. ), Disp: 20 tablet, Rfl: 5 .  pramipexole (MIRAPEX) 0.25 MG tablet, Take 1 tablet (0.25 mg total) by mouth at bedtime., Disp: 90 tablet, Rfl: 1  Review of Systems:  Constitutional: Denies fever, chills, diaphoresis, appetite change and fatigue.  HEENT: Denies photophobia, eye pain, redness, hearing loss, ear pain, congestion, sore throat, rhinorrhea, sneezing, mouth sores, trouble swallowing, neck pain, neck stiffness and tinnitus.   Respiratory: Denies SOB, DOE, cough, chest tightness,  and wheezing.   Cardiovascular: Denies chest pain, palpitations and leg swelling.  Gastrointestinal: Denies nausea, vomiting, abdominal pain, diarrhea, constipation, blood in stool and abdominal distention.  Genitourinary: Denies dysuria, urgency, frequency, hematuria, flank pain and difficulty urinating.  Endocrine: Denies: hot or cold intolerance, sweats, changes in hair or nails, polyuria, polydipsia. Musculoskeletal: Denies myalgias, back pain, joint swelling, arthralgias and gait problem.  Skin: Denies pallor, rash and wound.  Neurological: Denies dizziness, seizures, syncope, weakness, light-headedness, numbness and headaches.  Hematological: Denies adenopathy. Easy bruising, personal or family bleeding history  Psychiatric/Behavioral: Denies suicidal ideation,  mood changes, confusion, nervousness, sleep disturbance and agitation   Assessment and Plan:  Acquired hypothyroidism -TSH 2.760 in 11/19. -Continue synthroid.  Dyslipidemia -Last LDL 42 in 3/19. -Was taken off statins  due to myalgias. -At his age, has probably outlived the benefits of a statin.  Essential hypertension -Per pt, has been checking his BP at home and "it is good", even tho he cannot give me the exact measurements.  Malignant neoplasm of posterior wall of urinary bladder (HCC) -Follows with [facility], Dr. Gilford Rile next month.  Restless leg syndrome, controlled -He is not sure if he has been taking his pramipexole every night.  Fatigue -With normal TSH and Vit B12 recently. -Suspect due to advanced age. -No longer on statins.    I discussed the assessment and treatment plan with the patient. The patient was provided an opportunity to ask questions and all were answered. The patient agreed with the plan and demonstrated an understanding of the instructions.   The patient was advised to call back or seek an in-person evaluation if the symptoms worsen or if the condition fails to improve as anticipated.  I provided 24 minutes of non-face-to-face time during this encounter.   Lelon Frohlich, MD [facility]

## 2018-12-19 ENCOUNTER — Other Ambulatory Visit: Payer: Self-pay

## 2018-12-19 ENCOUNTER — Encounter (INDEPENDENT_AMBULATORY_CARE_PROVIDER_SITE_OTHER): Payer: Medicare Other | Admitting: Ophthalmology

## 2018-12-19 DIAGNOSIS — H43813 Vitreous degeneration, bilateral: Secondary | ICD-10-CM | POA: Diagnosis not present

## 2018-12-19 DIAGNOSIS — H35033 Hypertensive retinopathy, bilateral: Secondary | ICD-10-CM | POA: Diagnosis not present

## 2018-12-19 DIAGNOSIS — I10 Essential (primary) hypertension: Secondary | ICD-10-CM | POA: Diagnosis not present

## 2018-12-19 DIAGNOSIS — H353231 Exudative age-related macular degeneration, bilateral, with active choroidal neovascularization: Secondary | ICD-10-CM | POA: Diagnosis not present

## 2018-12-29 ENCOUNTER — Other Ambulatory Visit: Payer: Self-pay | Admitting: Internal Medicine

## 2018-12-29 ENCOUNTER — Other Ambulatory Visit: Payer: Self-pay | Admitting: *Deleted

## 2018-12-29 MED ORDER — LEVOTHYROXINE SODIUM 75 MCG PO TABS: 75.0000 ug | ORAL_TABLET | Freq: Every day | ORAL | 1 refills | Status: DC

## 2019-01-04 ENCOUNTER — Telehealth: Payer: Self-pay | Admitting: Cardiology

## 2019-01-04 NOTE — Telephone Encounter (Signed)
[place], no smartphone, pre reg complete 01/04/19 AF

## 2019-01-04 NOTE — Progress Notes (Signed)
Virtual Visit via Telephone Note   This visit type was conducted due to national recommendations for restrictions regarding the COVID-19 Pandemic (e.g. social distancing) in an effort to limit this patient's exposure and mitigate transmission in our community.  Due to his co-morbid illnesses, this patient is at least at moderate risk for complications without adequate follow up.  This format is felt to be most appropriate for this patient at this time.  The patient did not have access to video technology/had technical difficulties with video requiring transitioning to audio format only (telephone).  All issues noted in this document were discussed and addressed.  No physical exam could be performed with this format.  Please refer to the patient's chart for his  consent to telehealth for [facility].   Date:  01/05/2019   ID:  Raymond Swanson, DOB [birth year], MRN [id number]  Patient Location: Home Provider Location: Home  PCP:  Isaac Bliss, Rayford Halsted, MD  Cardiologist:  Minus Breeding, MD  Electrophysiologist:  None   Evaluation Performed:  Follow-Up Visit  Chief Complaint:  Syncope   History of Present Illness:    Raymond Swanson is a 85 y.o. male with the  patient presents for follow up of CAD.   He was in the hospital with enterococcus bacteremia in March of last year.   He had a TEE and he was not found to have endocarditis.    He had been doing well but had syncope twice in the last 10 days or so.  He said that the first time was about 10 days ago and he was standing up to eat a late night snack.  He felt something going to warm up in his chest and he had frank syncope.  He found himself on the ground.  There was no trauma.  He felt okay after that.  He did not call 911 or see anybody.  He has not been having any orthostatic symptoms.  He has not been having any new shortness of breath, PND or orthopnea.  He did have any loss of bowel or bladder.  He did not pay much attention to  it until he did it again a couple of days later.  Again he was standing and he ended up on the floor.  He said the next morning he woke up and his back was hurting him so he thinks he had some trauma.  He has been more fatigued since then.  He notes that on the second episode he did have a significantly elevated blood pressure which was new for him but this did not last very long.  He otherwise has not had this happen before and is felt okay.  He has not had any cough fevers or chills.  He has not been exposed to coronavirus.  The patient does not have symptoms concerning for COVID-19 infection (fever, chills, cough, or new shortness of breath).    Past Medical History:  Diagnosis Date  . ACUT GASTR ULCER W/HEMORR W/O MENTION OBST 09/10/2009  . ANEMIA 10/08/2009  . Cancer ([place])   . CORONARY ARTERY DISEASE 01/26/2007  . Fall 2016   fall from deck while cleaning gutters; sustained subdemral hematoma ; doing ok now   . History of kidney stones   . HOH (hard of hearing)   . HYPERLIPIDEMIA 01/26/2007  . HYPERTENSION 01/26/2007  . HYPOTHYROIDISM 10/18/2007  . MELENA 08/28/2009  . PROSTATE CANCER, HX OF 01/26/2007  . SKIN CANCER, HX OF 01/26/2007  Past Surgical History:  Procedure Laterality Date  . CATARACT EXTRACTION    . CORONARY ARTERY BYPASS GRAFT     1991; reports at PAT appt 10-11-17: i went to my priamry doctor for a physical and had aroutine stress test that was bad, was sent for cath ( more than 1 but nsure how many or if stents were placed), he rprots " that didnt work so they did the surgery". denies heart symptoms for over 20 years;  sees  cardiology Shandrea Lusk annually for EKGs   . CYSTOSCOPY W/ URETERAL STENT PLACEMENT Right 10/13/2017   Procedure: CYSTOSCOPY WITH STENT REPLACEMENT;  Surgeon: Ceasar Mons, MD;  [facility];  Service: Urology;  Laterality: Right;  . EXCISION MASS UPPER EXTREMETIES Left 08/11/2016   Procedure: EXCISION MASS LEFT SHOULDER, LEFT FOREARM, LEFT WRIST;   Surgeon: Judeth Horn, MD;  Location: [place];  Service: General;  Laterality: Left;  . HERNIA REPAIR     ingunial  . MOHS SURGERY    . PROSTATE SURGERY     prostatectomy  . TEE WITHOUT CARDIOVERSION N/A 11/17/2017   Procedure: TRANSESOPHAGEAL ECHOCARDIOGRAM (TEE);  Surgeon: Dixie Dials, MD;  Location: [facility] ENDOSCOPY;  Service: Cardiovascular;  Laterality: N/A;  . TRANSURETHRAL RESECTION OF BLADDER TUMOR N/A 10/13/2017   Procedure: TRANSURETHRAL RESECTION OF BLADDER TUMOR/ BIPOLAR (TURBT);  Surgeon: Ceasar Mons, MD;  [facility];  Service: Urology;  Laterality: N/A;  ONLY NEEDS 90 MIN FOR BOTH PROCEDURES     Current Meds  Medication Sig  . aspirin EC 81 MG tablet Take 81 mg by mouth daily.  Marland Kitchen levothyroxine (SYNTHROID) 75 MCG tablet Take 1 tablet (75 mcg total) by mouth daily.  . pramipexole (MIRAPEX) 0.25 MG tablet Take 1 tablet (0.25 mg total) by mouth at bedtime.     Allergies:   Patient has no known allergies.   Social History   Tobacco Use  . Smoking status: Former Smoker    Packs/day: 1.00    Years: 20.00    Pack years: 20.00    Types: Cigarettes    Last attempt to quit: 10/28/1948    Years since quitting: 70.2  . Smokeless tobacco: Never Used  Substance Use Topics  . Alcohol use: Yes    Comment: 4 oz. wine daily  . Drug use: No     Family Hx: The patient's family history includes GI problems in his sister; Lung cancer (age of onset: 30) in his brother.  ROS:   Please see the history of present illness.    As stated in the HPI and negative for all other systems.   Prior CV studies:   The following studies were reviewed today:  None  Labs/Other Tests and Data Reviewed:    EKG:  11/14/27   NSR, rate 109, axis WNL , intervals WNL, no acute ST T wave changes  Recent Labs: 03/15/2018: ALT 17 07/12/2018: BUN 27; Creatinine, Ser 1.10; Hemoglobin 10.4; Platelets 275.0; Potassium 4.3; Sodium 145; TSH 2.76   Recent Lipid Panel Lab Results  Component  Value Date/Time   CHOL 94 11/14/2017 04:18 AM   TRIG 43 11/14/2017 04:18 AM   HDL 43 11/14/2017 04:18 AM   CHOLHDL 2.2 11/14/2017 04:18 AM   LDLCALC 42 11/14/2017 04:18 AM    Wt Readings from Last 3 Encounters:  01/05/19 125 lb (56.7 kg)  08/09/18 135 lb 1.6 oz (61.3 kg)  07/12/18 135 lb 8 oz (61.5 kg)     Objective:    Vital Signs:  BP 132/80   Pulse 80   Ht 5\' 10"  (1.778 m)   Wt 125 lb (56.7 kg)   BMI 17.94 kg/m    VITAL SIGNS:  reviewed  ASSESSMENT & PLAN:    SYNCOPE - I am concerned about this.  He promises that the next time this happens he would call 911.  I looked back at his EKG and he has no conduction abnormalities.  Is not done this before.  Otherwise felt well.  He needs to come in to the office and get an EKG, orthostatics and he has not had any recent blood work.  Pending this he likely will need at least an event monitor.  He is told not to drive so he needs to arrange transportation.  CORONARY ARTERY DISEASE -  The patient has no new sypmtoms.  No further cardiovascular testing is indicated.  We will continue with aggressive risk reduction and meds as listed.  HYPERTENSION -  BP is OK as above.  Check orthostatics when he comes to the office.   HYPERLIPIDEMIA -  Continue current therapy.   COVID-19 Education: The signs and symptoms of COVID-19 were discussed with the patient and how to seek care for testing (follow up with PCP or arrange E-visit).  The importance of social distancing was discussed today.  Time:   Today, I have spent 25 minutes with the patient with telehealth technology discussing the above problems.     Medication Adjustments/Labs and Tests Ordered: Current medicines are reviewed at length with the patient today.  Concerns regarding medicines are outlined above.   Tests Ordered: No orders of the defined types were placed in this encounter.   Medication Changes: No orders of the defined types were placed in this encounter.    Disposition:  Follow up as above  Signed, Minus Breeding, MD  01/05/2019 2:20 PM    [facility]

## 2019-01-05 ENCOUNTER — Telehealth (INDEPENDENT_AMBULATORY_CARE_PROVIDER_SITE_OTHER): Payer: Medicare Other | Admitting: Cardiology

## 2019-01-05 ENCOUNTER — Telehealth: Payer: Self-pay | Admitting: Cardiology

## 2019-01-05 VITALS — BP 132/80 | HR 80 | Ht 70.0 in | Wt 125.0 lb

## 2019-01-05 DIAGNOSIS — I10 Essential (primary) hypertension: Secondary | ICD-10-CM

## 2019-01-05 DIAGNOSIS — I2510 Atherosclerotic heart disease of native coronary artery without angina pectoris: Secondary | ICD-10-CM | POA: Diagnosis not present

## 2019-01-05 DIAGNOSIS — Z7189 Other specified counseling: Secondary | ICD-10-CM

## 2019-01-05 DIAGNOSIS — E785 Hyperlipidemia, unspecified: Secondary | ICD-10-CM

## 2019-01-05 NOTE — Patient Instructions (Signed)
Medication Instructions:  Continue current medications  If you need a refill on your cardiac medications before your next appointment, please call your pharmacy.  Labwork: None Ordered   Testing/Procedures: None Ordered   Follow-Up: . Your physician recommends that you schedule a follow-up appointment in: [place], you and your health needs are our priority.  As part of our continuing mission to provide you with exceptional heart care, we have created designated Provider Care Teams.  These Care Teams include your primary Cardiologist (physician) and Advanced Practice Providers (APPs -  Physician Assistants and Nurse Practitioners) who all work together to provide you with the care you need, when you need it.  Thank you for choosing [facility] at [facility]!!

## 2019-01-05 NOTE — Telephone Encounter (Signed)
My Chart message sent

## 2019-01-05 NOTE — Progress Notes (Signed)
HPI: Evaluate patient with syncope.  Patient also with history of coronary artery disease.  Followed by Dr. Percival Spanish.  Had coronary artery bypass and graft in 1991 with a LIMA to the LAD, saphenous vein graft to the obtuse marginal and saphenous vein graft to the right coronary artery.  Needed for enterococcal bacteremia in March 2019.  Transesophageal echocardiogram at that time showed normal LV function, mild to moderate aortic stenosis, mild aortic insufficiency, moderate mitral regurgitation, mild left atrial enlargement, mild right ventricular enlargement.  Patient apparently had 2 syncopal episodes recently.  He was seen via telehealth visit by Dr. Percival Spanish 01/05/19 and asked to present for full evaluation in the office today.  Patient states that 10 days ago he had a syncopal episode while standing in the kitchen.  There was no preceding chest pain, palpitations, nausea or diaphoresis.  He was unconscious for seconds by his report.  He did not have incontinence.  No seizure activity.  He had a second episode several days later with similar description.  He otherwise has some dyspnea on exertion but no orthopnea or PND.  Occasional mild pedal edema.  No chest pain.  Current Outpatient Medications  Medication Sig Dispense Refill  . aspirin EC 81 MG tablet Take 81 mg by mouth daily.    . carvedilol (COREG) 3.125 MG tablet Take 1 tablet (3.125 mg total) by mouth 2 (two) times daily with a meal. (Patient not taking: Reported on 01/05/2019) 60 tablet 1  . furosemide (LASIX) 40 MG tablet Take 0.5 tablets (20 mg total) by mouth daily. (Patient not taking: Reported on 01/05/2019) 30 tablet 0  . levothyroxine (SYNTHROID) 75 MCG tablet Take 1 tablet (75 mcg total) by mouth daily. 90 tablet 1  . nitroGLYCERIN (NITROSTAT) 0.4 MG SL tablet Place 1 tablet (0.4 mg total) under the tongue every 5 (five) minutes as needed. (Patient not taking: Reported on 01/05/2019) 20 tablet 5  . pramipexole (MIRAPEX) 0.25 MG  tablet Take 1 tablet (0.25 mg total) by mouth at bedtime. 90 tablet 1   No current facility-administered medications for this visit.      Past Medical History:  Diagnosis Date  . ACUT GASTR ULCER W/HEMORR W/O MENTION OBST 09/10/2009  . ANEMIA 10/08/2009  . Cancer ([place])   . CORONARY ARTERY DISEASE 01/26/2007  . Fall 2016   fall from deck while cleaning gutters; sustained subdemral hematoma ; doing ok now   . History of kidney stones   . HOH (hard of hearing)   . HYPERLIPIDEMIA 01/26/2007  . HYPERTENSION 01/26/2007  . HYPOTHYROIDISM 10/18/2007  . MELENA 08/28/2009  . PROSTATE CANCER, HX OF 01/26/2007  . SKIN CANCER, HX OF 01/26/2007    Past Surgical History:  Procedure Laterality Date  . CATARACT EXTRACTION    . CORONARY ARTERY BYPASS GRAFT     1991; reports at PAT appt 10-11-17: i went to my priamry doctor for a physical and had aroutine stress test that was bad, was sent for cath ( more than 1 but nsure how many or if stents were placed), he rprots " that didnt work so they did the surgery". denies heart symptoms for over 20 years;  sees  cardiology Hochrein annually for EKGs   . CYSTOSCOPY W/ URETERAL STENT PLACEMENT Right 10/13/2017   Procedure: CYSTOSCOPY WITH STENT REPLACEMENT;  Surgeon: Ceasar Mons, MD;  Location: [facility];  Service: Urology;  Laterality: Right;  . EXCISION MASS UPPER EXTREMETIES Left 08/11/2016   Procedure: EXCISION  MASS LEFT SHOULDER, LEFT FOREARM, LEFT WRIST;  Surgeon: Judeth Horn, MD;  Location: [place];  Service: General;  Laterality: Left;  . HERNIA REPAIR     ingunial  . MOHS SURGERY    . PROSTATE SURGERY     prostatectomy  . TEE WITHOUT CARDIOVERSION N/A 11/17/2017   Procedure: TRANSESOPHAGEAL ECHOCARDIOGRAM (TEE);  Surgeon: Dixie Dials, MD;  Location: [facility];  Service: Cardiovascular;  Laterality: N/A;  . TRANSURETHRAL RESECTION OF BLADDER TUMOR N/A 10/13/2017   Procedure: TRANSURETHRAL RESECTION OF BLADDER TUMOR/ BIPOLAR (TURBT);  Surgeon:  Ceasar Mons, MD;  Location: [facility];  Service: Urology;  Laterality: N/A;  ONLY NEEDS 90 MIN FOR BOTH PROCEDURES    Social History   Socioeconomic History  . Marital status: Married    Spouse name: Not on file  . Number of children: Not on file  . Years of education: Not on file  . Highest education level: Not on file  Occupational History  . Not on file  Social Needs  . Financial resource strain: Not on file  . Food insecurity:    Worry: Not on file    Inability: Not on file  . Transportation needs:    Medical: Not on file    Non-medical: Not on file  Tobacco Use  . Smoking status: Former Smoker    Packs/day: 1.00    Years: 20.00    Pack years: 20.00    Types: Cigarettes    Last attempt to quit: 10/28/1948    Years since quitting: 70.2  . Smokeless tobacco: Never Used  Substance and Sexual Activity  . Alcohol use: Yes    Comment: 4 oz. wine daily  . Drug use: No  . Sexual activity: Not on file  Lifestyle  . Physical activity:    Days per week: Not on file    Minutes per session: Not on file  . Stress: Not on file  Relationships  . Social connections:    Talks on phone: Not on file    Gets together: Not on file    Attends religious service: Not on file    Active member of club or organization: Not on file    Attends meetings of clubs or organizations: Not on file    Relationship status: Not on file  . Intimate partner violence:    Fear of current or ex partner: Not on file    Emotionally abused: Not on file    Physically abused: Not on file    Forced sexual activity: Not on file  Other Topics Concern  . Not on file  Social History Narrative  . Not on file    Family History  Problem Relation Age of Onset  . Lung cancer Brother 108  . GI problems Sister     ROS: no fevers or chills, productive cough, hemoptysis, dysphasia, odynophagia, melena, hematochezia, dysuria, hematuria, rash, seizure activity, orthopnea, PND, pedal edema, claudication.  Remaining systems are negative.  Physical Exam: Well-developed well-nourished in no acute distress.  Skin is warm and dry.  HEENT is normal.  Neck is supple.  Chest is clear to auscultation with normal expansion.  Cardiovascular exam is regular rate and rhythm.  3/6 systolic murmur left sternal border.  S2 is not diminished. Abdominal exam nontender or distended. No masses palpated. Extremities show tracew edema. neuro grossly intact  ECG-normal sinus rhythm at a rate of 98, normal axis, nonspecific ST changes personally reviewed  A/P  1 syncope-etiology unclear.  Not orthostatic.  No preceding symptoms.  We will repeat echocardiogram to assess LV function and rule out progression of aortic stenosis.  Schedule event monitor.  Patient instructed not to drive for 6 months.  2 Coronary artery disease status post coronary artery bypass and graft-patient denies chest pain.  Plan to continue aspirin.  Can consider statin when he returns to see Dr. Percival Spanish.  3 hypertension-continue present blood pressure medications and follow.  4 history of mild to moderate aortic stenosis-plan follow-up echocardiogram to reassess.  Does not sound severe on examination.  5 hyperlipidemia-Per Dr. Percival Spanish.  Kirk Ruths, MD

## 2019-01-05 NOTE — Progress Notes (Deleted)
HPI: FU CAD and evaluate syncope; followed by Dr Percival Spanish.  Coronary artery bypass and graft in 1991 with a LIMA to the LAD, saphenous vein graft to the right coronary artery and saphenous vein graft to the obtuse marginal.  Patient treated with antibiotics for enterococcal bacteremia in March 2019.  TEE at that time showed ejection fraction 60 to 65%, mild to moderate aortic stenosis, mild aortic insufficiency, moderate mitral regurgitation, mild left atrial enlargement, mild right ventricular enlargement.  Patient had telehealth visit with Dr. Percival Spanish on May 14.  He describes syncope and was asked to be seen in the office for further evaluation.  Current Outpatient Medications  Medication Sig Dispense Refill  . aspirin EC 81 MG tablet Take 81 mg by mouth daily.    . carvedilol (COREG) 3.125 MG tablet Take 1 tablet (3.125 mg total) by mouth 2 (two) times daily with a meal. (Patient not taking: Reported on 01/05/2019) 60 tablet 1  . furosemide (LASIX) 40 MG tablet Take 0.5 tablets (20 mg total) by mouth daily. (Patient not taking: Reported on 01/05/2019) 30 tablet 0  . levothyroxine (SYNTHROID) 75 MCG tablet Take 1 tablet (75 mcg total) by mouth daily. 90 tablet 1  . nitroGLYCERIN (NITROSTAT) 0.4 MG SL tablet Place 1 tablet (0.4 mg total) under the tongue every 5 (five) minutes as needed. (Patient not taking: Reported on 01/05/2019) 20 tablet 5  . pramipexole (MIRAPEX) 0.25 MG tablet Take 1 tablet (0.25 mg total) by mouth at bedtime. 90 tablet 1   No current facility-administered medications for this visit.      Past Medical History:  Diagnosis Date  . ACUT GASTR ULCER W/HEMORR W/O MENTION OBST 09/10/2009  . ANEMIA 10/08/2009  . Cancer ([place])   . CORONARY ARTERY DISEASE 01/26/2007  . Fall 2016   fall from deck while cleaning gutters; sustained subdemral hematoma ; doing ok now   . History of kidney stones   . HOH (hard of hearing)   . HYPERLIPIDEMIA 01/26/2007  . HYPERTENSION 01/26/2007  .  HYPOTHYROIDISM 10/18/2007  . MELENA 08/28/2009  . PROSTATE CANCER, HX OF 01/26/2007  . SKIN CANCER, HX OF 01/26/2007    Past Surgical History:  Procedure Laterality Date  . CATARACT EXTRACTION    . CORONARY ARTERY BYPASS GRAFT     1991; reports at PAT appt 10-11-17: i went to my priamry doctor for a physical and had aroutine stress test that was bad, was sent for cath ( more than 1 but nsure how many or if stents were placed), he rprots " that didnt work so they did the surgery". denies heart symptoms for over 20 years;  sees  cardiology Hochrein annually for EKGs   . CYSTOSCOPY W/ URETERAL STENT PLACEMENT Right 10/13/2017   Procedure: CYSTOSCOPY WITH STENT REPLACEMENT;  Surgeon: Ceasar Mons, MD;  [facility];  Service: Urology;  Laterality: Right;  . EXCISION MASS UPPER EXTREMETIES Left 08/11/2016   Procedure: EXCISION MASS LEFT SHOULDER, LEFT FOREARM, LEFT WRIST;  Surgeon: Judeth Horn, MD;  Location: [place];  Service: General;  Laterality: Left;  . HERNIA REPAIR     ingunial  . MOHS SURGERY    . PROSTATE SURGERY     prostatectomy  . TEE WITHOUT CARDIOVERSION N/A 11/17/2017   Procedure: TRANSESOPHAGEAL ECHOCARDIOGRAM (TEE);  Surgeon: Dixie Dials, MD;  [facility];  Service: Cardiovascular;  Laterality: N/A;  . TRANSURETHRAL RESECTION OF BLADDER TUMOR N/A 10/13/2017   Procedure: TRANSURETHRAL RESECTION OF BLADDER  TUMOR/ BIPOLAR (TURBT);  Surgeon: Ceasar Mons, MD;  [facility];  Service: Urology;  Laterality: N/A;  ONLY NEEDS 90 MIN FOR BOTH PROCEDURES    Social History   Socioeconomic History  . Marital status: Married    Spouse name: Not on file  . Number of children: Not on file  . Years of education: Not on file  . Highest education level: Not on file  Occupational History  . Not on file  Social Needs  . Financial resource strain: Not on file  . Food insecurity:    Worry: Not on file    Inability: Not on file  . Transportation needs:     Medical: Not on file    Non-medical: Not on file  Tobacco Use  . Smoking status: Former Smoker    Packs/day: 1.00    Years: 20.00    Pack years: 20.00    Types: Cigarettes    Last attempt to quit: 10/28/1948    Years since quitting: 70.2  . Smokeless tobacco: Never Used  Substance and Sexual Activity  . Alcohol use: Yes    Comment: 4 oz. wine daily  . Drug use: No  . Sexual activity: Not on file  Lifestyle  . Physical activity:    Days per week: Not on file    Minutes per session: Not on file  . Stress: Not on file  Relationships  . Social connections:    Talks on phone: Not on file    Gets together: Not on file    Attends religious service: Not on file    Active member of club or organization: Not on file    Attends meetings of clubs or organizations: Not on file    Relationship status: Not on file  . Intimate partner violence:    Fear of current or ex partner: Not on file    Emotionally abused: Not on file    Physically abused: Not on file    Forced sexual activity: Not on file  Other Topics Concern  . Not on file  Social History Narrative  . Not on file    Family History  Problem Relation Age of Onset  . Lung cancer Brother 75  . GI problems Sister     ROS: no fevers or chills, productive cough, hemoptysis, dysphasia, odynophagia, melena, hematochezia, dysuria, hematuria, rash, seizure activity, orthopnea, PND, pedal edema, claudication. Remaining systems are negative.  Physical Exam: Well-developed well-nourished in no acute distress.  Skin is warm and dry.  HEENT is normal.  Neck is supple.  Chest is clear to auscultation with normal expansion.  Cardiovascular exam is regular rate and rhythm.  Abdominal exam nontender or distended. No masses palpated. Extremities show no edema. neuro grossly intact  ECG- personally reviewed  A/P  1 syncope-etiology unclear.  We will arrange an echocardiogram to reassess LV function.  We will arrange an event  monitor as well.  Patient instructed not to drive for 6 months.  2 Coronary artery disease-patient is not having chest pain.  Continue medical therapy with aspirin.  3 hypertension-  4 hyperlipidemia-  Kirk Ruths, MD

## 2019-01-06 ENCOUNTER — Ambulatory Visit: Payer: Medicare Other | Admitting: Cardiology

## 2019-01-06 ENCOUNTER — Encounter: Payer: Self-pay | Admitting: Cardiology

## 2019-01-06 ENCOUNTER — Other Ambulatory Visit: Payer: Self-pay

## 2019-01-06 VITALS — BP 142/78 | HR 98 | Temp 96.9°F | Resp 14 | Ht 68.0 in | Wt 124.2 lb

## 2019-01-06 DIAGNOSIS — I352 Nonrheumatic aortic (valve) stenosis with insufficiency: Secondary | ICD-10-CM | POA: Diagnosis not present

## 2019-01-06 DIAGNOSIS — R55 Syncope and collapse: Secondary | ICD-10-CM | POA: Diagnosis not present

## 2019-01-06 DIAGNOSIS — I10 Essential (primary) hypertension: Secondary | ICD-10-CM | POA: Diagnosis not present

## 2019-01-06 DIAGNOSIS — I2510 Atherosclerotic heart disease of native coronary artery without angina pectoris: Secondary | ICD-10-CM | POA: Diagnosis not present

## 2019-01-06 NOTE — Patient Instructions (Signed)
Medication Instructions:  NO CHANGE If you need a refill on your cardiac medications before your next appointment, please call your pharmacy.   Lab work: If you have labs (blood work) drawn today and your tests are completely normal, you will receive your results only by: Marland Kitchen MyChart Message (if you have MyChart) OR . A paper copy in the mail If you have any lab test that is abnormal or we need to change your treatment, we will call you to review the results.  Testing/Procedures: Your physician has requested that you have an echocardiogram. Echocardiography is a painless test that uses sound waves to create images of your heart. It provides your doctor with information about the size and shape of your heart and how well your heart's chambers and valves are working. This procedure takes approximately one hour. There are no restrictions for this procedure.  [place] has recommended that you wear a 30 DAY event monitor. Event monitors are medical devices that record the heart's electrical activity. Doctors most often [place] these monitors to diagnose arrhythmias. Arrhythmias are problems with the speed or rhythm of the heartbeat. The monitor is a small, portable device. You can wear one while you do your normal daily activities. This is usually used to diagnose what is causing palpitations/syncope (passing out).[place]  Follow-Up: Your physician recommends that you schedule a follow-up appointment in: [place] VISIT

## 2019-01-09 ENCOUNTER — Telehealth: Payer: Self-pay | Admitting: *Deleted

## 2019-01-09 NOTE — Telephone Encounter (Signed)
Patient enrolled for [employer] to ship a 30 day cardiac event monitor to his home.  Upon receipt, review instructions, apply monitor and call [employer] to send baseline recording.

## 2019-01-13 ENCOUNTER — Telehealth (HOSPITAL_COMMUNITY): Payer: Self-pay | Admitting: Radiology

## 2019-01-13 NOTE — Telephone Encounter (Signed)

## 2019-01-17 ENCOUNTER — Other Ambulatory Visit: Payer: Self-pay

## 2019-01-17 ENCOUNTER — Other Ambulatory Visit (HOSPITAL_COMMUNITY): Payer: Medicare Other

## 2019-01-17 ENCOUNTER — Ambulatory Visit (HOSPITAL_COMMUNITY): Payer: Medicare Other | Attending: Cardiovascular Disease

## 2019-01-17 DIAGNOSIS — R55 Syncope and collapse: Secondary | ICD-10-CM | POA: Insufficient documentation

## 2019-01-22 NOTE — Progress Notes (Signed)
Set up next available telephone visit with me.  He has severe AS.  We need to discuss whether he wants a procedure to be considered to treat this.  Send results to Isaac Bliss, Rayford Halsted, MD

## 2019-01-25 ENCOUNTER — Ambulatory Visit (INDEPENDENT_AMBULATORY_CARE_PROVIDER_SITE_OTHER): Payer: Medicare Other

## 2019-01-25 DIAGNOSIS — R55 Syncope and collapse: Secondary | ICD-10-CM

## 2019-01-29 ENCOUNTER — Telehealth: Payer: Self-pay | Admitting: Physician Assistant

## 2019-01-29 NOTE — Telephone Encounter (Signed)
Paged by [employer].  Patient had 9 beats of nonsustained VT at 2 PM central time.  We will continue to monitor.

## 2019-02-06 ENCOUNTER — Telehealth: Payer: Self-pay | Admitting: *Deleted

## 2019-02-06 ENCOUNTER — Telehealth: Payer: Self-pay | Admitting: Internal Medicine

## 2019-02-06 NOTE — Telephone Encounter (Signed)
Dr Hochrein's pt Raymond Swanson

## 2019-02-06 NOTE — Telephone Encounter (Signed)
Call placed to the patient after receiving a critical monitor result from [employer]. The patient had a 12 beat run of vtach on 6/15 around 5:30 am.   The patient stated that he may have gotten up to go to the restroom at that time. He stated that he feels fine and has not had any dizziness or syncope. He denies shortness of breath and chest pain. He stated that he has felt better since the monitor has been placed and is asymptomatic.   The patient stated that he has not been taking his Carvedilol 3.125 mg bid.  Per DOD, the patient should start taking this medication and have an appointment with APP this week. The patient has been set up with an appointment on 6/18 and has been advised to start his Carvedilol 3.125 mg bid. He has verbalized his understanding.   Monitor results have been placed in [facility] [place] box for the appointment,

## 2019-02-06 NOTE — Telephone Encounter (Signed)
Cardiology Moon[place] Note  Returned page from [employer]. Patient found to be in sinus arrhythmia ~70 bpm. Had 12 beats of VT at 177bpm. Reached out to patient by telephone, no answer. Will send message to Dr. Stanford Breed.  Marcie Mowers, MD Cardiology Fellow, PGY-6

## 2019-02-08 ENCOUNTER — Telehealth: Payer: Self-pay | Admitting: Cardiology

## 2019-02-08 NOTE — Telephone Encounter (Signed)
LVM for patient to call and confirm his appointment on 02-09-19.

## 2019-02-09 ENCOUNTER — Ambulatory Visit (INDEPENDENT_AMBULATORY_CARE_PROVIDER_SITE_OTHER): Payer: Medicare Other | Admitting: Cardiology

## 2019-02-09 ENCOUNTER — Other Ambulatory Visit: Payer: Self-pay

## 2019-02-09 ENCOUNTER — Encounter: Payer: Self-pay | Admitting: Cardiology

## 2019-02-09 VITALS — BP 150/66 | HR 91 | Temp 97.9°F | Wt 131.8 lb

## 2019-02-09 DIAGNOSIS — I10 Essential (primary) hypertension: Secondary | ICD-10-CM | POA: Diagnosis not present

## 2019-02-09 DIAGNOSIS — I350 Nonrheumatic aortic (valve) stenosis: Secondary | ICD-10-CM | POA: Diagnosis not present

## 2019-02-09 DIAGNOSIS — H9190 Unspecified hearing loss, unspecified ear: Secondary | ICD-10-CM | POA: Insufficient documentation

## 2019-02-09 DIAGNOSIS — I4729 Other ventricular tachycardia: Secondary | ICD-10-CM | POA: Insufficient documentation

## 2019-02-09 DIAGNOSIS — R55 Syncope and collapse: Secondary | ICD-10-CM | POA: Insufficient documentation

## 2019-02-09 DIAGNOSIS — Z951 Presence of aortocoronary bypass graft: Secondary | ICD-10-CM

## 2019-02-09 DIAGNOSIS — I472 Ventricular tachycardia: Secondary | ICD-10-CM

## 2019-02-09 MED ORDER — FUROSEMIDE 20 MG PO TABS: 20.0000 mg | ORAL_TABLET | Freq: Every day | ORAL | 1 refills | Status: DC

## 2019-02-09 MED ORDER — CARVEDILOL 3.125 MG PO TABS: 3.1250 mg | ORAL_TABLET | Freq: Two times a day (BID) | ORAL | 1 refills | Status: DC

## 2019-02-09 NOTE — Assessment & Plan Note (Signed)
Hearing aid in place but not much use. Very difficult time trying to communicate with the patient especially with mask and shield in place.

## 2019-02-09 NOTE — Patient Instructions (Addendum)
Medication Instructions:  Your physician recommends that you continue on your current medications as directed. Please refer to the Current Medication list given to you today. If you need a refill on your cardiac medications before your next appointment, please call your pharmacy.   Lab work: None  If you have labs (blood work) drawn today and your tests are completely normal, you will receive your results only by: Marland Kitchen MyChart Message (if you have MyChart) OR . A paper copy in the mail If you have any lab test that is abnormal or we need to change your treatment, we will call you to review the results.  Testing/Procedures: None   Follow-Up: At [facility], you and your health needs are our priority.  As part of our continuing mission to provide you with exceptional heart care, we have created designated Provider Care Teams.  These Care Teams include your primary Cardiologist (physician) and Advanced Practice Providers (APPs -  Physician Assistants and Nurse Practitioners) who all work together to provide you with the care you need, when you need it. . Follow up with Dr Percival Spanish as scheduled  Any Other Special Instructions Will Be Listed Below (If Applicable).

## 2019-02-09 NOTE — Assessment & Plan Note (Signed)
2 episodes May 2020

## 2019-02-09 NOTE — Assessment & Plan Note (Signed)
CABG x 3 1991 with LIMA-LAD, SVG-OM, SVG-RCA

## 2019-02-09 NOTE — Progress Notes (Signed)
Cardiology Office Note:    Date:  02/09/2019   ID:  ELIOTT AMPARAN, DOB 1925-01-01, MRN [id number]  PCP:  Isaac Bliss, Rayford Halsted, MD  Cardiologist:  Minus Breeding, MD  Electrophysiologist:  None   Referring MD: Isaac Bliss, Estel*   Syncope  History of Present Illness:    Raymond Swanson is a 85 y.o. male with a hx of CABG x 3 in 1991.  He is HOH and it was extremely difficult to communicate with him today.  His hearing aid was useless and he had a hard time understanding me through mask and face shield.    The patient had syncope x 2 in May.  Echo done 01/17/2019 showed normal LVF with severe AS, peak gradient 62 mmHg.  He also had a monitor which showed two runs of NSVT, one 9 beats and one 12 beats.  The patient has not been taking his Coreg, it's not clear why.  I spoke with [place], a nice lady that is helping him at home and brought him to the office.  I explained the situation to her and she will try and relay this to the patient and his wife.  I'll discuss with Dr Percival Spanish wether he thinks the patient is a candidate for TAVR.  According to [place] the patient is independent at home.   Past Medical History:  Diagnosis Date  . ACUT GASTR ULCER W/HEMORR W/O MENTION OBST 09/10/2009  . ANEMIA 10/08/2009  . Cancer ([place])   . CORONARY ARTERY DISEASE 01/26/2007  . Fall 2016   fall from deck while cleaning gutters; sustained subdemral hematoma ; doing ok now   . History of kidney stones   . HOH (hard of hearing)   . HYPERLIPIDEMIA 01/26/2007  . HYPERTENSION 01/26/2007  . HYPOTHYROIDISM 10/18/2007  . MELENA 08/28/2009  . PROSTATE CANCER, HX OF 01/26/2007  . SKIN CANCER, HX OF 01/26/2007    Past Surgical History:  Procedure Laterality Date  . CATARACT EXTRACTION    . CORONARY ARTERY BYPASS GRAFT     1991; reports at PAT appt 10-11-17: i went to my priamry doctor for a physical and had aroutine stress test that was bad, was sent for cath ( more than 1 but nsure how many or if stents were  placed), he rprots " that didnt work so they did the surgery". denies heart symptoms for over 20 years;  sees  cardiology Hochrein annually for EKGs   . CYSTOSCOPY W/ URETERAL STENT PLACEMENT Right 10/13/2017   Procedure: CYSTOSCOPY WITH STENT REPLACEMENT;  Surgeon: Ceasar Mons, MD;  [facility];  Service: Urology;  Laterality: Right;  . EXCISION MASS UPPER EXTREMETIES Left 08/11/2016   Procedure: EXCISION MASS LEFT SHOULDER, LEFT FOREARM, LEFT WRIST;  Surgeon: Judeth Horn, MD;  Location: [facility];  Service: General;  Laterality: Left;  . HERNIA REPAIR     ingunial  . MOHS SURGERY    . PROSTATE SURGERY     prostatectomy  . TEE WITHOUT CARDIOVERSION N/A 11/17/2017   Procedure: TRANSESOPHAGEAL ECHOCARDIOGRAM (TEE);  Surgeon: Dixie Dials, MD;  Location[facility] END[facility]COPY;  Service: Cardiovascular;  Laterality: N/A;  . TRANSURETHRAL RESECTION OF BLADDER TUMOR N/A 10/13/2017   Procedure: TRANSURETHRAL RESECTION OF BLADDER TUMOR/ BIPOLAR (TURBT);  Surgeon: Ceasar Mons, MD;  [facility];  Service: Urology;  Laterality: N/A;  ONLY NEEDS 90 MIN FOR BOTH PROCEDURES    Current Medications: Current Meds  Medication Sig  . aspirin EC 81 MG tablet Take 81  mg by mouth daily.  . furosemide (LASIX) 20 MG tablet Take 1 tablet (20 mg total) by mouth daily.  Marland Kitchen levothyroxine (SYNTHROID) 75 MCG tablet Take 1 tablet (75 mcg total) by mouth daily.  . nitroGLYCERIN (NITROSTAT) 0.4 MG SL tablet Place 1 tablet (0.4 mg total) under the tongue every 5 (five) minutes as needed.  . [DISCONTINUED] furosemide (LASIX) 40 MG tablet Take 0.5 tablets (20 mg total) by mouth daily.     Allergies:   Patient has no known allergies.   Social History   Socioeconomic History  . Marital status: Married    Spouse name: Not on file  . Number of children: Not on file  . Years of education: Not on file  . Highest education level: Not on file  Occupational History  . Not on file  Social Needs  .  Financial resource strain: Not on file  . Food insecurity    Worry: Not on file    Inability: Not on file  . Transportation needs    Medical: Not on file    Non-medical: Not on file  Tobacco Use  . Smoking status: Former Smoker    Packs/day: 1.00    Years: 20.00    Pack years: 20.00    Types: Cigarettes    Quit date: 10/28/1948    Years since quitting: 70.3  . Smokeless tobacco: Never Used  Substance and Sexual Activity  . Alcohol use: Yes    Comment: 4 oz. wine daily  . Drug use: No  . Sexual activity: Not on file  Lifestyle  . Physical activity    Days per week: Not on file    Minutes per session: Not on file  . Stress: Not on file  Relationships  . Social Herbalist on phone: Not on file    Gets together: Not on file    Attends religious service: Not on file    Active member of club or organization: Not on file    Attends meetings of clubs or organizations: Not on file    Relationship status: Not on file  Other Topics Concern  . Not on file  Social History Narrative  . Not on file     Family History: The patient's family history includes GI problems in his sister; Lung cancer (age of onset: 16) in his brother.  ROS:   Please see the history of present illness.     All other systems reviewed and are negative.  EKGs/Labs/Other Studies Reviewed:    The following studies were reviewed today: Echo 01/17/2019  EKG:  EKG is 02/09/2019 ordered today.  The ekg ordered today demonstrates NSR, LVH-HR 84  Recent Labs: 03/15/2018: ALT 17 07/12/2018: BUN 27; Creatinine, Ser 1.10; Hemoglobin 10.4; Platelets 275.0; Potassium 4.3; Sodium 145; TSH 2.76  Recent Lipid Panel    Component Value Date/Time   CHOL 94 11/14/2017 0418   TRIG 43 11/14/2017 0418   HDL 43 11/14/2017 0418   CHOLHDL 2.2 11/14/2017 0418   VLDL 9 11/14/2017 0418   LDLCALC 42 11/14/2017 0418    Physical Exam:    VS:  BP (!) 150/66   Pulse 91   Temp 97.9 F (36.6 C)   Wt 131 lb 12.8 oz  (59.8 kg)   SpO2 94%   BMI 20.04 kg/m     Wt Readings from Last 3 Encounters:  02/09/19 131 lb 12.8 oz (59.8 kg)  01/06/19 124 lb 3.2 oz (56.3 kg)  01/05/19 125 lb (  56.7 kg)     GEN:  Frail, thin male in no acute distress HEENT: Normal-HOH NECK: No JVD;  LYMPHATICS: No lymphadenopathy CARDIAC: RRR, 2/6 holosystolic murmur LSB and AOV RESPIRATORY:  Clear to auscultation without rales, wheezing or rhonchi  ABDOMEN: Soft, non-tender, non-distended MUSCULOSKELETAL:  No edema; No deformity  SKIN: Warm and dry Trace LE edema NEUROLOGIC:  Alert and oriented x 3 PSYCHIATRIC:  Normal affect   ASSESSMENT:    Syncope and collapse 2 episodes May 2020  Severe aortic stenosis Echo 01/17/2019- mean gard 39 mmHg, peak 62 mmHg 2 episodes of syncope in May and documented NSVT runs.   NSVT (nonsustained ventricular tachycardia) (HCC) 9 beats and 12 beats noted on monitor (pt had not been taking his Coreg 3.125 mg BID) Echo 01/17/2019- EF 55-60% with severe AS  Hx of CABG CABG x 3 1991 with LIMA-LAD, SVG-OM, SVG-RCA  HOH (hard of hearing) Hearing aid in place but not much use. Very difficult time trying to communicate with the patient especially with mask and shield in place.   PLAN:    Resume Coreg 3.125 mg BID Resume Lasix 20 mg daily Review with Dr Percival Spanish- ? TAVR candidate   Medication Adjustments/Labs and Tests Ordered: Current medicines are reviewed at length with the patient today.  Concerns regarding medicines are outlined above.  Orders Placed This Encounter  Procedures  . EKG 12-Lead   Meds ordered this encounter  Medications  . carvedilol (COREG) 3.125 MG tablet    Sig: Take 1 tablet (3.125 mg total) by mouth 2 (two) times daily with a meal.    Dispense:  180 tablet    Refill:  1  . furosemide (LASIX) 20 MG tablet    Sig: Take 1 tablet (20 mg total) by mouth daily.    Dispense:  90 tablet    Refill:  1    Patient Instructions  Medication Instructions:  Your  physician recommends that you continue on your current medications as directed. Please refer to the Current Medication list given to you today. If you need a refill on your cardiac medications before your next appointment, please call your pharmacy.   Lab work: None  If you have labs (blood work) drawn today and your tests are completely normal, you will receive your results only by: Marland Kitchen MyChart Message (if you have MyChart) OR . A paper copy in the mail If you have any lab test that is abnormal or we need to change your treatment, we will call you to review the results.  Testing/Procedures: None   Follow-Up: At [facility], you and your health needs are our priority.  As part of our continuing mission to provide you with exceptional heart care, we have created designated Provider Care Teams.  These Care Teams include your primary Cardiologist (physician) and Advanced Practice Providers (APPs -  Physician Assistants and Nurse Practitioners) who all work together to provide you with the care you need, when you need it. . Follow up with Dr Percival Spanish as scheduled  Any Other Special Instructions Will Be Listed Below (If Applicable).      Signed, Kerin Ransom, PA-C  02/09/2019 4:13 PM    [facility]

## 2019-02-09 NOTE — Assessment & Plan Note (Signed)
9 beats and 12 beats noted on monitor (pt had not been taking his Coreg 3.125 mg BID) Echo 01/17/2019- EF 55-60% with severe AS

## 2019-02-09 NOTE — Assessment & Plan Note (Signed)
Echo 01/17/2019- mean gard 39 mmHg, peak 62 mmHg 2 episodes of syncope in May and documented NSVT runs.

## 2019-02-10 ENCOUNTER — Telehealth: Payer: Self-pay | Admitting: *Deleted

## 2019-02-10 NOTE — Telephone Encounter (Signed)
Alert from [facility] came in with a notice that the patient had a 3.1 second pause and bradycardia with heart rater 30 bpms. This occurred this morning around 1 am.   Message left with the patient to call back.

## 2019-02-10 NOTE — Telephone Encounter (Signed)
Spoke with the patient about his results. He stated that he was still feeling well, asymptomatic.   He started the carvedilol 3.125 mg bid last night and the furosemide today.   Per DOD, no changes at this time. The record has been placed in Dr. Rosezella [place] basket for review. The patient has been advised to call if anything is needed.

## 2019-02-10 NOTE — Telephone Encounter (Signed)
New Message    Patient returning your call please call him back.

## 2019-02-13 ENCOUNTER — Encounter (INDEPENDENT_AMBULATORY_CARE_PROVIDER_SITE_OTHER): Payer: Medicare Other | Admitting: Ophthalmology

## 2019-02-13 ENCOUNTER — Other Ambulatory Visit: Payer: Self-pay

## 2019-02-13 DIAGNOSIS — H43813 Vitreous degeneration, bilateral: Secondary | ICD-10-CM | POA: Diagnosis not present

## 2019-02-13 DIAGNOSIS — H35033 Hypertensive retinopathy, bilateral: Secondary | ICD-10-CM | POA: Diagnosis not present

## 2019-02-13 DIAGNOSIS — H353231 Exudative age-related macular degeneration, bilateral, with active choroidal neovascularization: Secondary | ICD-10-CM | POA: Diagnosis not present

## 2019-02-13 DIAGNOSIS — I10 Essential (primary) hypertension: Secondary | ICD-10-CM

## 2019-02-15 ENCOUNTER — Telehealth: Payer: Self-pay | Admitting: Cardiology

## 2019-02-15 NOTE — Telephone Encounter (Signed)
Received a page from [facility] regarding cardiac monitor. Patient reported to have 20 beat run of VT with self termination back to SR with rate of 74 at 625pm. They attempted to contact the patient unsuccessfully. I called and was able to talk with the patient, he is [facility] and didn't hear the phone call prior to mine. He reports being asymptomatic with this episode and has actually been feeling better then his usual today. States he is complaint with his coreg. Appears he had stopped taking prior to recent office visit, and just restarted. Informed of warning signs and when to call 911. He voiced understanding and thanked me for the follow up call.

## 2019-02-16 ENCOUNTER — Telehealth: Payer: Self-pay

## 2019-02-16 ENCOUNTER — Encounter: Payer: Self-pay | Admitting: *Deleted

## 2019-02-16 NOTE — Addendum Note (Signed)
Addended by: Alvina Filbert B on: 02/16/2019 03:36 PM   Modules accepted: Orders

## 2019-02-16 NOTE — Telephone Encounter (Signed)
Left message to call back to see about seeing APP as soon as possible

## 2019-02-16 NOTE — Telephone Encounter (Signed)
Patient is returning call.  °

## 2019-02-16 NOTE — Telephone Encounter (Signed)
Dr Percival Spanish has no open office visits soon. Discussed with Doreene Burke PA and he suggested office visit with him today, he saw patient last. Spoke with patient and he could not come to office today for visit secondary to no ride. Patient is feeling fine and was asymptomatic yesterday during episode. Confirmed patient taking Coreg 3.125 mg twice a day. Blood pressure 136/75 HR 72 Discussed further with Lurena Joiner and will increase Coreg to 6.25 mg in the morning and 3.125 mg in the evening. Advised patient, verbalized understanding. Scheduled patient appointment for 02/21/19 with Lurena Joiner when Dr Percival Spanish in the office. Patient agreeable to visit it he can get a ride.

## 2019-02-16 NOTE — Telephone Encounter (Signed)
This encounter was created in error - please disregard.

## 2019-02-16 NOTE — Telephone Encounter (Signed)
Received fax from [facility] that patient was having Ventricular Tachycardia (23 beat run) spoke with NP last night and was advised of when to go to ER.   Showed to DOD Dr.Acharya and she feels that the patient should be sooner than 07/17 appointment. Will route to MD to make aware as well as nurse to see if any appointments are sooner.

## 2019-02-20 ENCOUNTER — Telehealth: Payer: Self-pay | Admitting: Cardiology

## 2019-02-20 NOTE — Telephone Encounter (Signed)
New Message   Patient is calling about his appt on 06/30. He states that he can see you all in person if mask has to be worn. He is wondering can the appt be done as a virtual visit. Please call to discuss.

## 2019-02-20 NOTE — Telephone Encounter (Signed)
Pt has an appointment scheduled with Kerin Ransom, PA tomorrow 6/30 at 4 pm. Pt is requesting to have appointment changed to virtual visit. He report he has difficult hearing and can't hear when they provider is talking through a mask.   Will route to PA for approval to change appointment.

## 2019-02-20 NOTE — Telephone Encounter (Signed)
Yes its fine to change to a virtual visit if he has some way to communicate over the phone.  He was stone deaf when I saw him in the office.  Kerin Ransom PA-C 02/20/2019 2:04 PM

## 2019-02-20 NOTE — Telephone Encounter (Signed)
Appointment changed to virtual visit.

## 2019-02-21 ENCOUNTER — Telehealth (INDEPENDENT_AMBULATORY_CARE_PROVIDER_SITE_OTHER): Payer: Medicare Other | Admitting: Cardiology

## 2019-02-21 ENCOUNTER — Encounter: Payer: Self-pay | Admitting: Cardiology

## 2019-02-21 ENCOUNTER — Telehealth: Payer: Self-pay

## 2019-02-21 VITALS — BP 157/88 | HR 81 | Ht 68.0 in | Wt 123.0 lb

## 2019-02-21 DIAGNOSIS — H9190 Unspecified hearing loss, unspecified ear: Secondary | ICD-10-CM

## 2019-02-21 DIAGNOSIS — I350 Nonrheumatic aortic (valve) stenosis: Secondary | ICD-10-CM

## 2019-02-21 DIAGNOSIS — R55 Syncope and collapse: Secondary | ICD-10-CM

## 2019-02-21 DIAGNOSIS — I4729 Other ventricular tachycardia: Secondary | ICD-10-CM

## 2019-02-21 DIAGNOSIS — I472 Ventricular tachycardia: Secondary | ICD-10-CM | POA: Diagnosis not present

## 2019-02-21 DIAGNOSIS — Z951 Presence of aortocoronary bypass graft: Secondary | ICD-10-CM

## 2019-02-21 NOTE — Patient Instructions (Signed)
Medication Instructions:  Your physician recommends that you continue on your current medications as directed. Please refer to the Current Medication list given to you today. If you need a refill on your cardiac medications before your next appointment, please call your pharmacy.   Lab work: None  If you have labs (blood work) drawn today and your tests are completely normal, you will receive your results only by: Marland Kitchen MyChart Message (if you have MyChart) OR . A paper copy in the mail If you have any lab test that is abnormal or we need to change your treatment, we will call you to review the results.  Testing/Procedures: None   Follow-Up: At [facility], you and your health needs are our priority.  As part of our continuing mission to provide you with exceptional heart care, we have created designated Provider Care Teams.  These Care Teams include your primary Cardiologist (physician) and Advanced Practice Providers (APPs -  Physician Assistants and Nurse Practitioners) who all work together to provide you with the care you need, when you need it. Marland Kitchen LUKE RECOMMENDS YOU FOLLOW UP WITH DR [facility] AS SCHEDULED  Any Other Special Instructions Will Be Listed Below (If Applicable). GO TO THE EMERGENCY DEPT IF YOU NEED TO  MAKE SURE YOUR NURSE COMES WITH YOU TO YOUR NEXT APPOINTMENT

## 2019-02-21 NOTE — Progress Notes (Signed)
Virtual Visit via Telephone Note   This visit type was conducted due to national recommendations for restrictions regarding the COVID-19 Pandemic (e.g. social distancing) in an effort to limit this patient's exposure and mitigate transmission in our community.  Due to his co-morbid illnesses, this patient is at least at moderate risk for complications without adequate follow up.  This format is felt to be most appropriate for this patient at this time.  The patient did not have access to video technology/had technical difficulties with video requiring transitioning to audio format only (telephone).  All issues noted in this document were discussed and addressed.  No physical exam could be performed with this format.  Please refer to the patient's chart for his  consent to telehealth for [facility].   Date:  02/21/2019   ID:  MARGARITA BOBROWSKI, DOB Jun 12, 1925, MRN [id number]  Patient Location: Home Provider Location: Office  PCP:  Isaac Bliss, Rayford Halsted, MD  Cardiologist:  Minus Breeding, MD  Electrophysiologist:  None   Evaluation Performed:  Follow-Up Visit  Chief Complaint:  Near syncope 6/25  History of Present Illness:    Raymond Swanson is a 85 y.o. male with a hx of CABG x 3 in 1991, severe AS, and NSVT.  He is HOH and it is extremely difficult to communicate with him.  His hearing is useless and he had a hard time understanding me through mask and face shield when I saw him in the office 02/09/2019.  On the phone he has an amplification device but there is a lot of interference.   The patient had syncope x 2 in May.  Echo done 01/17/2019 showed normal LVF with severe AS, peak gradient 62 mmHg.  He also had a monitor which showed two runs of NSVT, one 9 beats and one 12 beats.  The patient had not been taking his Coreg, it's not clear why.  I spoke with Raymond Swanson, a nice lady that is helping him at home and brought him to the office.  I explained the situation to her and she will try  and relay this to the patient and his wife. Dr Percival Spanish did not think he was a candidate for TAVR.  According to Raymond Swanson the patient is independent at home.  We had him resume his Coreg 3.125 mg BID.  On 02/16/2019 he had choked on some food at dinner, then had 23 beats of VT with near syncope on telemetry.  It was suggested the patient go to the ED but he did not.  The strips were shown to me the next day and I suggested we increase his Coreg to 6.25 mg in the am and 3.125 mg at night (he did have a 3 sec pause overnight as well on his monitor).  He was contacted by phone today for follow up. Communication was difficult.  I did try and explain to him that he had a tight heart valve and an irregular heart rhythm that potentially could be fatal. He asked what could be done and I explained our recommendation was for medical Rx.  I did suggest he make sure his affairs were in order.  He has an appointment with Dr Percival Spanish on 7/17.  I urged him to go to the ED at [facility] if he has any further tachycardia or syncope. He did mention to me he was appreciative of our care.   The patient does not have symptoms concerning for COVID-19 infection (fever, chills, cough, or new shortness  of breath).    Past Medical History:  Diagnosis Date  . ACUT GASTR ULCER W/HEMORR W/O MENTION OBST 09/10/2009  . ANEMIA 10/08/2009  . Cancer ([place])   . CORONARY ARTERY DISEASE 01/26/2007  . Fall 2016   fall from deck while cleaning gutters; sustained subdemral hematoma ; doing ok now   . History of kidney stones   . HOH (hard of hearing)   . HYPERLIPIDEMIA 01/26/2007  . HYPERTENSION 01/26/2007  . HYPOTHYROIDISM 10/18/2007  . MELENA 08/28/2009  . PROSTATE CANCER, HX OF 01/26/2007  . SKIN CANCER, HX OF 01/26/2007   Past Surgical History:  Procedure Laterality Date  . CATARACT EXTRACTION    . CORONARY ARTERY BYPASS GRAFT     1991; reports at PAT appt 10-11-17: i went to my priamry doctor for a physical and had aroutine stress test that was  bad, was sent for cath ( more than 1 but nsure how many or if stents were placed), he rprots " that didnt work so they did the surgery". denies heart symptoms for over 20 years;  sees  cardiology Hochrein annually for EKGs   . CYSTOSCOPY W/ URETERAL STENT PLACEMENT Right 10/13/2017   Procedure: CYSTOSCOPY WITH STENT REPLACEMENT;  Surgeon: Ceasar Mons, MD;  [facility];  Service: Urology;  Laterality: Right;  . EXCISION MASS UPPER EXTREMETIES Left 08/11/2016   Procedure: EXCISION MASS LEFT SHOULDER, LEFT FOREARM, LEFT WRIST;  Surgeon: Judeth Horn, MD;  Location: [facility];  Service: General;  Laterality: Left;  . HERNIA REPAIR     ingunial  . MOHS SURGERY    . PROSTATE SURGERY     prostatectomy  . TEE WITHOUT CARDIOVERSION N/A 11/17/2017   Procedure: TRANSESOPHAGEAL ECHOCARDIOGRAM (TEE);  Surgeon: Dixie Dials, MD;  Location: [facility] ENDOSCOPY;  Service: Cardiovascular;  Laterality: N/A;  . TRANSURETHRAL RESECTION OF BLADDER TUMOR N/A 10/13/2017   Procedure: TRANSURETHRAL RESECTION OF BLADDER TUMOR/ BIPOLAR (TURBT);  Surgeon: Ceasar Mons, MD;  [facility];  Service: Urology;  Laterality: N/A;  ONLY NEEDS 90 MIN FOR BOTH PROCEDURES     Current Meds  Medication Sig  . aspirin EC 81 MG tablet Take 81 mg by mouth daily.  . carvedilol (COREG) 3.125 MG tablet Take 3.125 mg by mouth as directed. Take 2 tablets in the morning and 1 tablet in the evening  . furosemide (LASIX) 20 MG tablet Take 1 tablet (20 mg total) by mouth daily.  Marland Kitchen levothyroxine (SYNTHROID) 75 MCG tablet Take 1 tablet (75 mcg total) by mouth daily.  . Multiple Vitamins-Minerals (PRESERVISION AREDS 2 PO) Take 1 tablet by mouth daily.  . nitroGLYCERIN (NITROSTAT) 0.4 MG SL tablet Place 1 tablet (0.4 mg total) under the tongue every 5 (five) minutes as needed.     Allergies:   Patient has no known allergies.   Social History   Tobacco Use  . Smoking status: Former Smoker    Packs/day: 1.00    Years:  20.00    Pack years: 20.00    Types: Cigarettes    Quit date: 10/28/1948    Years since quitting: 70.3  . Smokeless tobacco: Never Used  Substance Use Topics  . Alcohol use: Yes    Comment: 4 oz. wine daily  . Drug use: No     Family Hx: The patient's family history includes GI problems in his sister; Lung cancer (age of onset: 104) in his brother.  ROS:   Please see the history of present illness.    All  other systems reviewed and are negative.   Prior CV studies:   The following studies were reviewed today:   Labs/Other Tests and Data Reviewed:    EKG:  No ECG reviewed.  Recent Labs: 03/15/2018: ALT 17 07/12/2018: BUN 27; Creatinine, Ser 1.10; Hemoglobin 10.4; Platelets 275.0; Potassium 4.3; Sodium 145; TSH 2.76   Recent Lipid Panel Lab Results  Component Value Date/Time   CHOL 94 11/14/2017 04:18 AM   TRIG 43 11/14/2017 04:18 AM   HDL 43 11/14/2017 04:18 AM   CHOLHDL 2.2 11/14/2017 04:18 AM   LDLCALC 42 11/14/2017 04:18 AM    Wt Readings from Last 3 Encounters:  02/21/19 123 lb (55.8 kg)  02/09/19 131 lb 12.8 oz (59.8 kg)  01/06/19 124 lb 3.2 oz (56.3 kg)     Objective:    Vital Signs:  BP (!) 157/88   Pulse 81   Ht 5\' 8"  (1.727 m)   Wt 123 lb (55.8 kg)   BMI 18.70 kg/m    VITAL SIGNS:  reviewed  ASSESSMENT & PLAN:    Syncope and collapse 2 episodes May 2020  Severe aortic stenosis Echo 01/17/2019- mean gard 39 mmHg, peak 62 mmHg   NSVT (nonsustained ventricular tachycardia) (HCC) 9 beats, 12 beats, and 23 beats noted on monitor (Coreg 3.125 mg BID started 6/18, increased 6/25 after 23 beat run) Echo 01/17/2019- EF 55-60% with severe AS  Hx of CABG CABG x 3 1991 with LIMA-LAD, SVG-OM, SVG-RCA  HOH (hard of hearing) Hearing aid in place but not much use. Very difficult time trying to communicate with the patient especially with mask and shield in place.   COVID-19 Education: The signs and symptoms of COVID-19 were discussed with the  patient and how to seek care for testing (follow up with PCP or arrange E-visit).  The importance of social distancing was discussed today.  Time:   Today, I have spent 20 minutes with the patient with telehealth technology discussing the above problems.     Medication Adjustments/Labs and Tests Ordered: Current medicines are reviewed at length with the patient today.  Concerns regarding medicines are outlined above.   Tests Ordered: No orders of the defined types were placed in this encounter.   Medication Changes: No orders of the defined types were placed in this encounter.   Follow Up:  In Person I would suggest planning on written communication with the patient for that visit.   Signed, Kerin Ransom, PA-C  02/21/2019 4:20 PM    [facility]

## 2019-02-21 NOTE — Telephone Encounter (Signed)
PATIENT TOLD DURING VISIT TO FOLLOW UP AS SCHEDULED. HE IS HARD OF HEARING. SO I DINT NOT GIVE HIM ANOTHER CALL.

## 2019-02-24 ENCOUNTER — Telehealth: Payer: Self-pay | Admitting: Physician Assistant

## 2019-02-24 NOTE — Telephone Encounter (Signed)
   Received call from [employer] that pt had 14 beats NSVT. He then converted back to NSR 60bpm. I called pt and was able to speak with him through use of his amplifier. He reports he's feeling just fine and having a great day without any CP, SOB, palpitations, syncope or near syncope. Reports med compliance. This appears consistent with his prior history. He also has hx of 3 sec pauses per chart so I would be hesitant to titrate BB based on short duration and asymptomatic nature. Would continue to monitor and will route to ordering provider.  Kyree Fedorko PA-C

## 2019-02-27 NOTE — Telephone Encounter (Signed)
Faxed monitor report from this episode received and given to [facility] MD

## 2019-02-28 ENCOUNTER — Other Ambulatory Visit: Payer: Self-pay

## 2019-03-08 ENCOUNTER — Ambulatory Visit (INDEPENDENT_AMBULATORY_CARE_PROVIDER_SITE_OTHER): Payer: Medicare Other | Admitting: Internal Medicine

## 2019-03-08 DIAGNOSIS — I350 Nonrheumatic aortic (valve) stenosis: Secondary | ICD-10-CM

## 2019-03-08 DIAGNOSIS — R55 Syncope and collapse: Secondary | ICD-10-CM | POA: Diagnosis not present

## 2019-03-08 DIAGNOSIS — Z0289 Encounter for other administrative examinations: Secondary | ICD-10-CM

## 2019-03-08 NOTE — Progress Notes (Signed)
Virtual Visit via Telephone Note  I connected with Raymond Swanson on 03/08/19 at  3:00 PM EDT by telephone and verified that I am speaking with the correct person using two identifiers.   I discussed the limitations, risks, security and privacy concerns of performing an evaluation and management service by telephone and the availability of in person appointments. I also discussed with the patient that there may be a patient responsible charge related to this service. The patient expressed understanding and agreed to proceed.  Location patient: home Location provider: work office Participants present for the call: patient, provider Patient did not have a visit in the prior 7 days to address this/these issue(s).   History of Present Illness:  Patient has not scheduled this visit today to update me on events that have transpired in the recent months.  In May he had 2 episodes of syncope.  Saw cardiology and an echo showed severe aortic valve stenosis.  Plans for further work-up are still in progress.  He is 85 years old and is in relatively good health.  He is very hard of hearing.  His major complaint is that he now needs to use a walker and he would like to be more physically active   Observations/Objective: Patient sounds cheerful and well on the phone. I do not appreciate any increased work of breathing. Speech and thought processing are grossly intact. Patient reported vitals: None reported   Current Outpatient Medications:  .  aspirin EC 81 MG tablet, Take 81 mg by mouth daily., Disp: , Rfl:  .  carvedilol (COREG) 3.125 MG tablet, Take 3.125 mg by mouth as directed. Take 2 tablets in the morning and 1 tablet in the evening, Disp: , Rfl:  .  furosemide (LASIX) 20 MG tablet, Take 1 tablet (20 mg total) by mouth daily., Disp: 90 tablet, Rfl: 1 .  levothyroxine (SYNTHROID) 75 MCG tablet, Take 1 tablet (75 mcg total) by mouth daily., Disp: 90 tablet, Rfl: 1 .  Multiple  Vitamins-Minerals (PRESERVISION AREDS 2 PO), Take 1 tablet by mouth daily., Disp: , Rfl:  .  nitroGLYCERIN (NITROSTAT) 0.4 MG SL tablet, Place 1 tablet (0.4 mg total) under the tongue every 5 (five) minutes as needed., Disp: 20 tablet, Rfl: 5  Review of Systems:  Constitutional: Denies fever, chills, diaphoresis, appetite change and fatigue.  HEENT: Denies photophobia, eye pain, redness, hearing loss, ear pain, congestion, sore throat, rhinorrhea, sneezing, mouth sores, trouble swallowing, neck pain, neck stiffness and tinnitus.   Respiratory: Denies SOB, DOE, cough, chest tightness,  and wheezing.   Cardiovascular: Denies chest pain, palpitations and leg swelling.  Gastrointestinal: Denies nausea, vomiting, abdominal pain, diarrhea, constipation, blood in stool and abdominal distention.  Genitourinary: Denies dysuria, urgency, frequency, hematuria, flank pain and difficulty urinating.  Endocrine: Denies: hot or cold intolerance, sweats, changes in hair or nails, polyuria, polydipsia. Musculoskeletal: Denies myalgias, back pain, joint swelling, arthralgias and gait problem.  Skin: Denies pallor, rash and wound.  Neurological: Denies dizziness, seizures, syncope, weakness, light-headedness, numbness and headaches.  Hematological: Denies adenopathy. Easy bruising, personal or family bleeding history  Psychiatric/Behavioral: Denies suicidal ideation, mood changes, confusion, nervousness, sleep disturbance and agitation   Assessment and Plan:  Syncope and collapse Severe aortic stenosis -Syncope x2 in May. -Work-up by cardiology has revealed severe aortic valve stenosis, still not clear on further plans.  He is otherwise feeling well and has no complaints.  He states "I want to make sure that you are available for  me to talk to when I have any questions".    I discussed the assessment and treatment plan with the patient. The patient was provided an opportunity to ask questions and all were  answered. The patient agreed with the plan and demonstrated an understanding of the instructions.   The patient was advised to call back or seek an in-person evaluation if the symptoms worsen or if the condition fails to improve as anticipated.  I provided 14 minutes of non-face-to-face time during this encounter.   Lelon Frohlich, MD [facility]

## 2019-03-09 NOTE — Progress Notes (Signed)
Virtual Visit via Telephone Note   This visit type was conducted due to national recommendations for restrictions regarding the COVID-19 Pandemic (e.g. social distancing) in an effort to limit this patient's exposure and mitigate transmission in our community.  Due to his co-morbid illnesses, this patient is at least at moderate risk for complications without adequate follow up.  This format is felt to be most appropriate for this patient at this time.  The patient did not have access to video technology/had technical difficulties with video requiring transitioning to audio format only (telephone).  All issues noted in this document were discussed and addressed.  No physical exam could be performed with this format.  Please refer to the patient's chart for his  consent to telehealth for [facility].   Date:  03/10/2019   ID:  Raymond Swanson, DOB August 21, 1925, MRN [id number]  Patient Location: Home Provider Location: Home  PCP:  Isaac Bliss, Rayford Halsted, MD  Cardiologist:  Minus Breeding, MD  Electrophysiologist:  None   Evaluation Performed:  Follow-Up Visit  Chief Complaint:  Aortic stenosis  History of Present Illness:    Raymond Swanson is a 85 y.o. male with a hx of CABG x 3 in 1991, severe AS, and NSVT. He is HOH and it is extremely difficult to communicate with him. His hearing is useless and he had a hard time understanding me through mask and face shield when I saw him in the office 02/09/2019. The patient had syncope x 2 in May. Echo done 01/17/2019 showed normal LVF with severe AS, peak gradient 62 mmHg. He also has had a monitor with NSVT.    He as actually done well. He is still very weak.  He has had no syncope.  The patient denies any new symptoms such as chest discomfort, neck or arm discomfort. There has been no new shortness of breath, PND or orthopnea. There have been no reported palpitations, presyncope or syncope.   He has done quite a bit of research about TAVR.  He  is quite convinced he wants to think further about this.and would like a referral.    The patient does not have symptoms concerning for COVID-19 infection (fever, chills, cough, or new shortness of breath).    Past Medical History:  Diagnosis Date  . ACUT GASTR ULCER W/HEMORR W/O MENTION OBST 09/10/2009  . ANEMIA 10/08/2009  . Cancer ([place])   . CORONARY ARTERY DISEASE 01/26/2007  . Fall 2016   fall from deck while cleaning gutters; sustained subdemral hematoma ; doing ok now   . History of kidney stones   . HOH (hard of hearing)   . HYPERLIPIDEMIA 01/26/2007  . HYPERTENSION 01/26/2007  . HYPOTHYROIDISM 10/18/2007  . MELENA 08/28/2009  . PROSTATE CANCER, HX OF 01/26/2007  . SKIN CANCER, HX OF 01/26/2007   Past Surgical History:  Procedure Laterality Date  . CATARACT EXTRACTION    . CORONARY ARTERY BYPASS GRAFT     1991; reports at PAT appt 10-11-17: i went to my priamry doctor for a physical and had aroutine stress test that was bad, was sent for cath ( more than 1 but nsure how many or if stents were placed), he rprots " that didnt work so they did the surgery". denies heart symptoms for over 20 years;  sees  cardiology Jarod Bozzo annually for EKGs   . CYSTOSCOPY W/ URETERAL STENT PLACEMENT Right 10/13/2017   Procedure: CYSTOSCOPY WITH STENT REPLACEMENT;  Surgeon: Ceasar Mons, MD;  Location:  [facility];  Service: Urology;  Laterality: Right;  . EXCISION MASS UPPER EXTREMETIES Left 08/11/2016   Procedure: EXCISION MASS LEFT SHOULDER, LEFT FOREARM, LEFT WRIST;  Surgeon: Judeth Horn, MD;  Location: [place];  Service: General;  Laterality: Left;  . HERNIA REPAIR     ingunial  . MOHS SURGERY    . PROSTATE SURGERY     prostatectomy  . TEE WITHOUT CARDIOVERSION N/A 11/17/2017   Procedure: TRANSESOPHAGEAL ECHOCARDIOGRAM (TEE);  Surgeon: Dixie Dials, MD;  [facility];  Service: Cardiovascular;  Laterality: N/A;  . TRANSURETHRAL RESECTION OF BLADDER TUMOR N/A 10/13/2017   Procedure:  TRANSURETHRAL RESECTION OF BLADDER TUMOR/ BIPOLAR (TURBT);  Surgeon: Ceasar Mons, MD;  Location: [facility];  Service: Urology;  Laterality: N/A;  ONLY NEEDS 90 MIN FOR BOTH PROCEDURES     Current Meds  Medication Sig  . aspirin EC 81 MG tablet Take 81 mg by mouth daily.  . carvedilol (COREG) 3.125 MG tablet Take 3.125 mg by mouth as directed. Take 2 tablets in the morning and 1 tablet in the evening  . furosemide (LASIX) 20 MG tablet Take 1 tablet (20 mg total) by mouth daily.  Marland Kitchen levothyroxine (SYNTHROID) 75 MCG tablet Take 1 tablet (75 mcg total) by mouth daily.  . Multiple Vitamins-Minerals (PRESERVISION AREDS 2 PO) Take 1 tablet by mouth daily.  . nitroGLYCERIN (NITROSTAT) 0.4 MG SL tablet Place 1 tablet (0.4 mg total) under the tongue every 5 (five) minutes as needed.     Allergies:   Patient has no known allergies.   Social History   Tobacco Use  . Smoking status: Former Smoker    Packs/day: 1.00    Years: 20.00    Pack years: 20.00    Types: Cigarettes    Quit date: 10/28/1948    Years since quitting: 70.4  . Smokeless tobacco: Never Used  Substance Use Topics  . Alcohol use: Yes    Comment: 4 oz. wine daily  . Drug use: No     Family Hx: The patient's family history includes GI problems in his sister; Lung cancer (age of onset: 64) in his brother.  ROS:   Please see the history of present illness.    As stated in the HPI and negative for all other systems.   Prior CV studies:   The following studies were reviewed today:    Labs/Other Tests and Data Reviewed:    EKG:  No ECG reviewed.  Recent Labs: 03/15/2018: ALT 17 07/12/2018: BUN 27; Creatinine, Ser 1.10; Hemoglobin 10.4; Platelets 275.0; Potassium 4.3; Sodium 145; TSH 2.76   Recent Lipid Panel Lab Results  Component Value Date/Time   CHOL 94 11/14/2017 04:18 AM   TRIG 43 11/14/2017 04:18 AM   HDL 43 11/14/2017 04:18 AM   CHOLHDL 2.2 11/14/2017 04:18 AM   LDLCALC 42 11/14/2017 04:18 AM     Wt Readings from Last 3 Encounters:  03/10/19 122 lb (55.3 kg)  02/21/19 123 lb (55.8 kg)  02/09/19 131 lb 12.8 oz (59.8 kg)     Objective:    Vital Signs:  Ht 5\' 8"  (1.727 m)   Wt 122 lb (55.3 kg)   BMI 18.55 kg/m    NA  ASSESSMENT & PLAN:    Syncope and collapse He has had no further syncope.  No further work-up is necessary.  This may be related to his aortic stenosis and less likely the NSVT.   Severe aortic stenosis This is severe.  Echo 01/17/2019- mean  gard 39 mmHg, peak 62 mmHg.  He wants to have a discussion about TAVR and he is done a lot of good research and is a very intelligent gentleman.  He is quite frail.  I will set him up to see Dr. Burt Knack to further understand the risks benefits and the necessary steps to even be considered for this procedure.  At this point I am not sure that he is a candidate from a structural standpoint.  NSVT (nonsustained ventricular tachycardia) ([place]) He is doing much better with the increased dose of beta-blocker.  No further change in therapy.  Hx of CABG He is having no angina.  No further change in therapy.  HOH (hard of hearing) He has a great deal of difficulty hearing.  No change in therapy.   COVID-19 Education: The signs and symptoms of COVID-19 were discussed with the patient and how to seek care for testing (follow up with PCP or arrange E-visit).  The importance of social distancing was discussed today.  Time:   Today, I have spent 25 minutes with the patient with telehealth technology discussing the above problems.     Medication Adjustments/Labs and Tests Ordered: Current medicines are reviewed at length with the patient today.  Concerns regarding medicines are outlined above.   Tests Ordered: No orders of the defined types were placed in this encounter.   Medication Changes: No orders of the defined types were placed in this encounter.   Follow Up:  With Dr. Burt Knack and then me afterward  Signed, Minus Breeding, MD  03/10/2019 1:23 PM    [place]

## 2019-03-10 ENCOUNTER — Telehealth (INDEPENDENT_AMBULATORY_CARE_PROVIDER_SITE_OTHER): Payer: Medicare Other | Admitting: Cardiology

## 2019-03-10 ENCOUNTER — Encounter: Payer: Self-pay | Admitting: Cardiology

## 2019-03-10 ENCOUNTER — Other Ambulatory Visit: Payer: Self-pay | Admitting: *Deleted

## 2019-03-10 VITALS — Ht 68.0 in | Wt 122.0 lb

## 2019-03-10 DIAGNOSIS — I472 Ventricular tachycardia: Secondary | ICD-10-CM

## 2019-03-10 DIAGNOSIS — R55 Syncope and collapse: Secondary | ICD-10-CM

## 2019-03-10 DIAGNOSIS — H9190 Unspecified hearing loss, unspecified ear: Secondary | ICD-10-CM

## 2019-03-10 DIAGNOSIS — I2510 Atherosclerotic heart disease of native coronary artery without angina pectoris: Secondary | ICD-10-CM

## 2019-03-10 DIAGNOSIS — I350 Nonrheumatic aortic (valve) stenosis: Secondary | ICD-10-CM | POA: Diagnosis not present

## 2019-03-10 DIAGNOSIS — I4729 Other ventricular tachycardia: Secondary | ICD-10-CM

## 2019-03-10 DIAGNOSIS — Z7189 Other specified counseling: Secondary | ICD-10-CM

## 2019-03-10 NOTE — Patient Instructions (Signed)
Medication Instructions:  Your physician recommends that you continue on your current medications as directed. Please refer to the Current Medication list given to you today.  If you need a refill on your cardiac medications before your next appointment, please call your pharmacy.   Lab work: NONE   Testing/Procedures: NONE  Follow-Up: Your physician recommends that you schedule a follow-up appointment in: WITH DR COOPER THE OFFICE WILL CALL YOU WITH AN APPOINTMENT

## 2019-04-05 ENCOUNTER — Ambulatory Visit: Payer: Medicare Other | Admitting: Cardiology

## 2019-04-10 ENCOUNTER — Other Ambulatory Visit: Payer: Self-pay

## 2019-04-10 ENCOUNTER — Ambulatory Visit (INDEPENDENT_AMBULATORY_CARE_PROVIDER_SITE_OTHER): Payer: Medicare Other | Admitting: Cardiovascular Disease

## 2019-04-10 ENCOUNTER — Encounter: Payer: Self-pay | Admitting: Cardiovascular Disease

## 2019-04-10 VITALS — BP 144/60 | HR 62 | Ht 68.0 in | Wt 119.4 lb

## 2019-04-10 DIAGNOSIS — I350 Nonrheumatic aortic (valve) stenosis: Secondary | ICD-10-CM

## 2019-04-10 NOTE — Progress Notes (Signed)
Cardiology Office Note:    Date:  04/15/2019   ID:  ILIJAH Swanson, DOB [birth year], MRN [id number]  PCP:  Isaac Bliss, Rayford Halsted, MD  Cardiologist:  Minus Breeding, MD  Electrophysiologist:  None   Referring MD: Isaac Bliss, Estel*   Chief Complaint  Patient presents with  . Shortness of Breath    History of Present Illness:    Raymond Swanson is a 85 y.o. male referred by Dr Percival Spanish for further evaluation and treatment of severe aortic stenosis.   He is here with his care giver today. The patient is married and lives independently with his wife. 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 They have a son who lives in [place].   The patient had remote CABG in 1991 by Dr Redmond Pulling. He has not had problems with angina in decades and has not required cardiac catheterization since his bypass surgery. He has been followed for aortic stenosis which has recently progressed and has been associated with dyspnea on exertion. The patient now experiences dyspnea with most activities. He denies chest pain, chest pressure, orthopnea, or PND. The patient ambulates with a walker and has a limited functional capacity. He reports 2 episodes of dizziness and syncope that occurred a few months ago. In review of records, at least one of these episodes was associated with a run of NSVT on outpatient monitoring.   While the patient is quite intelligent and has written several specific questions about TAVR (including valve type), communication is extremely difficult because he is so hard of hearing. His care giver helps with this.   Past Medical History:  Diagnosis Date  . ACUT GASTR ULCER W/HEMORR W/O MENTION OBST 09/10/2009  . ANEMIA 10/08/2009  . Cancer ([place])   . CORONARY ARTERY DISEASE 01/26/2007  . Fall 2016   fall from deck while cleaning gutters; sustained subdemral hematoma ; doing ok now   . History of kidney stones   . HOH (hard of hearing)   . HYPERLIPIDEMIA 01/26/2007  . HYPERTENSION  01/26/2007  . HYPOTHYROIDISM 10/18/2007  . MELENA 08/28/2009  . PROSTATE CANCER, HX OF 01/26/2007  . SKIN CANCER, HX OF 01/26/2007    Past Surgical History:  Procedure Laterality Date  . CATARACT EXTRACTION    . CORONARY ARTERY BYPASS GRAFT     1991; reports at PAT appt 10-11-17: i went to my priamry doctor for a physical and had aroutine stress test that was bad, was sent for cath ( more than 1 but nsure how many or if stents were placed), he rprots " that didnt work so they did the surgery". denies heart symptoms for over 20 years;  sees  cardiology [facility]rein annually for EKGs   . CYSTOSCOPY W/ URETERAL STENT PLACEMENT Right 10/13/2017   Procedure: CYSTOSCOPY WITH STENT REPLACEMENT;  Surgeon: Ceasar Mons, MD;  Location: [facility];  Service: Urology;  Laterality: Right;  . EXCISION MASS UPPER EXTREMETIES Left 08/11/2016   Procedure: EXCISION MASS LEFT SHOULDER, LEFT FOREARM, LEFT WRIST;  Surgeon: Judeth Horn, MD;  Location: [facility];  Service: General;  Laterality: Left;  . HERNIA REPAIR     ingunial  . MOHS SURGERY    . PROSTATE SURGERY     prostatectomy  . TEE WITHOUT CARDIOVERSION N/A 11/17/2017   Procedure: TRANSESOPHAGEAL ECHOCARDIOGRAM (TEE);  Surgeon: Dixie Dials, MD;  Location: [facility];  Service: Cardiovascular;  Laterality: N/A;  . TRANSURETHRAL RESECTION OF BLADDER TUMOR N/A 10/13/2017   Procedure: TRANSURETHRAL RESECTION OF BLADDER TUMOR/  BIPOLAR (TURBT);  Surgeon: Ceasar Mons, MD;  Location: [facility];  Service: Urology;  Laterality: N/A;  ONLY NEEDS 90 MIN FOR BOTH PROCEDURES    Current Medications: Current Meds  Medication Sig  . aspirin EC 81 MG tablet Take 81 mg by mouth daily.  . furosemide (LASIX) 20 MG tablet Take 1 tablet (20 mg total) by mouth daily.  Marland Kitchen levothyroxine (SYNTHROID) 75 MCG tablet Take 1 tablet (75 mcg total) by mouth daily.  . Multiple Vitamins-Minerals (PRESERVISION AREDS 2 PO) Take 1 tablet by mouth daily.  . nitroGLYCERIN (NITROSTAT)  0.4 MG SL tablet Place 1 tablet (0.4 mg total) under the tongue every 5 (five) minutes as needed.  . [DISCONTINUED] carvedilol (COREG) 3.125 MG tablet Take 3.125 mg by mouth as directed. Take 2 tablets in the morning and 1 tablet in the evening     Allergies:   Patient has no known allergies.   Social History   Socioeconomic History  . Marital status: Married    Spouse name: Not on file  . Number of children: Not on file  . Years of education: Not on file  . Highest education level: Not on file  Occupational History  . Not on file  Social Needs  . Financial resource strain: Not on file  . Food insecurity    Worry: Not on file    Inability: Not on file  . Transportation needs    Medical: Not on file    Non-medical: Not on file  Tobacco Use  . Smoking status: Former Smoker    Packs/day: 1.00    Years: 20.00    Pack years: 20.00    Types: Cigarettes    Quit date: 10/28/1948    Years since quitting: 70.5  . Smokeless tobacco: Never Used  Substance and Sexual Activity  . Alcohol use: Yes    Comment: 4 oz. wine daily  . Drug use: No  . Sexual activity: Not on file  Lifestyle  . Physical activity    Days per week: Not on file    Minutes per session: Not on file  . Stress: Not on file  Relationships  . Social Herbalist on phone: Not on file    Gets together: Not on file    Attends religious service: Not on file    Active member of club or organization: Not on file    Attends meetings of clubs or organizations: Not on file    Relationship status: Not on file  Other Topics Concern  . Not on file  Social History Narrative  . Not on file     Family History: The patient's family history includes GI problems in his sister; Lung cancer (age of onset: 17) in his brother.  ROS:   Please see the history of present illness.    Positive for hard of hearing. All other systems reviewed and are negative.  EKGs/Labs/Other Studies Reviewed:    The following studies  were reviewed today: Echo 01/17/2019: IMPRESSIONS    1. The left ventricle has normal systolic function, with an ejection fraction of 55-60%. The cavity size was normal. There is mildly increased left ventricular wall thickness. Left ventricular diastolic Doppler parameters are consistent with impaired  relaxation. Indeterminate filling pressures.  2. The right ventricle has normal systolic function. The cavity was normal. There is no increase in right ventricular wall thickness. Right ventricular systolic pressure is moderately elevated with an estimated pressure of 64.5 mmHg.  3. Left  atrial size was moderately dilated.  4. The mitral valve is degenerative. Mild thickening of the mitral valve leaflet. There is moderate mitral annular calcification present.  5. The aortic valve is abnormal. Severely thickening of the aortic valve. Severe calcifcation of the aortic valve. Aortic valve regurgitation is moderate by color flow Doppler. Severe stenosis of the aortic valve.  6. The interatrial septum appears to be lipomatous.  LEFT VENTRICLE         +----------------+---------++ +--------------+--------++ Diastology                PLAX 2D                +----------------+---------++ +--------------+--------++ LV e' lateral:  8.38 cm/s LVIDd:        3.60 cm  +----------------+---------++ +--------------+--------++ LV E/e' lateral:11.1      LVIDs:        2.60 cm  +----------------+---------++ +--------------+--------++ LV e' medial:   6.74 cm/s LV PW:        0.90 cm  +----------------+---------++ +--------------+--------++ LV E/e' medial: 13.8      LV IVS:       0.90 cm  +----------------+---------++ +--------------+--------++ LVOT diam:    2.20 cm  +--------------+--------++ LV SV:        30 ml    +--------------+--------++ LV SV Index:  18.25    +--------------+--------++ LVOT Area:    3.80 cm +--------------+--------++                         +--------------+--------++  +---------------+----------++ RIGHT VENTRICLE           +---------------+----------++ RV Basal diam: 3.50 cm    +---------------+----------++ RV S prime:    12.50 cm/s +---------------+----------++ TAPSE (M-mode):1.6 cm     +---------------+----------++ RVSP:          57.5 mmHg  +---------------+----------++  +---------------+-------++-----------++ LEFT ATRIUM           Index       +---------------+-------++-----------++ LA diam:       4.70 cm2.82 cm/m  +---------------+-------++-----------++ LA Vol (A2C):  57.8 ml34.63 ml/m +---------------+-------++-----------++ LA Vol (A4C):  59.7 ml35.77 ml/m +---------------+-------++-----------++ LA Biplane Vol:60.9 ml36.49 ml/m +---------------+-------++-----------++ +------------+---------++-----------++ RIGHT ATRIUM         Index       +------------+---------++-----------++ RA Pressure:3.00 mmHg            +------------+---------++-----------++ RA Area:    13.00 cm            +------------+---------++-----------++ RA Volume:  34.70 ml 20.79 ml/m +------------+---------++-----------++  +------------------+------------++ AORTIC VALVE                   +------------------+------------++ AV Area (Vmax):   0.90 cm     +------------------+------------++ AV Area (Vmean):  0.93 cm     +------------------+------------++ AV Area (VTI):    1.14 cm     +------------------+------------++ AV Vmax:          394.00 cm/s  +------------------+------------++ AV Vmean:         280.200 cm/s +------------------+------------++ AV VTI:           0.770 m      +------------------+------------++ AV Peak Grad:     62.1 mmHg    +------------------+------------++ AV Mean Grad:     39.0 mmHg    +------------------+------------++ LVOT Vmax:        93.43 cm/s    +------------------+------------++ LVOT Vmean:       68.333 cm/s  +------------------+------------++ LVOT  VTI:         0.230 m      +------------------+------------++ LVOT/AV VTI ratio:0.30         +------------------+------------++ AR PHT:           222 msec     +------------------+------------++   +-------------+-------++ AORTA                +-------------+-------++ Ao Root diam:3.20 cm +-------------+-------++ Ao Asc diam: 3.20 cm +-------------+-------++  +--------------+----------++  +---------------+-----------++ MITRAL VALVE              TRICUSPID VALVE            +--------------+----------++  +---------------+-----------++ MV Area (PHT):3.77 cm    TR Peak grad:  54.5 mmHg   +--------------+----------++  +---------------+-----------++ MV PHT:       58.29 msec  TR Vmax:       369.00 cm/s +--------------+----------++  +---------------+-----------++ MV Decel Time:201 msec    Estimated RAP: 3.00 mmHg   +--------------+----------++  +---------------+-----------++ +--------------+-----------++ RVSP:          57.5 mmHg   MV E velocity:93.00 cm/s  +---------------+-----------++ +--------------+-----------++ MV A velocity:110.00 cm/s +--------------+-------+ +--------------+-----------++ SHUNTS                MV E/A ratio: 0.85        +--------------+-------+ +--------------+-----------++ Systemic VTI: 0.23 m                                +--------------+-------+                               Systemic Diam:2.20 cm                               +--------------+-------+     EKG:  EKG is not ordered today.  The ekg ordered 02-09-2019 demonstrates NSR with PAC's, LVH  Recent Labs: 07/12/2018: TSH 2.76 04/10/2019: BUN 30; Creatinine, Ser 1.23; Hemoglobin 11.5; Platelets 261; Potassium 4.4; Sodium 144  Recent Lipid Panel    Component Value Date/Time   CHOL 94 11/14/2017 0418   TRIG 43  11/14/2017 0418   HDL 43 11/14/2017 0418   CHOLHDL 2.2 11/14/2017 0418   VLDL 9 11/14/2017 0418   LDLCALC 42 11/14/2017 0418    Physical Exam:    VS:  BP (!) 144/60   Pulse 62   Ht 5\' 8"  (1.727 m)   Wt 119 lb 6.4 oz (54.2 kg)   SpO2 (!) 85%   BMI 18.15 kg/m     Wt Readings from Last 3 Encounters:  04/10/19 119 lb 6.4 oz (54.2 kg)  03/10/19 122 lb (55.3 kg)  02/21/19 123 lb (55.8 kg)     GEN:  Well nourished, well developed elderly male in no acute distress, very hard of hearing HEENT: Normal NECK: No JVD; BL carotid bruits LYMPHATICS: No lymphadenopathy CARDIAC: RRR, 3/6 harsh systolic murmur at the RUSB and 2/6 diastolic decrescendo murmur present RESPIRATORY:  Clear to auscultation without rales, wheezing or rhonchi  ABDOMEN: Soft, non-tender, non-distended MUSCULOSKELETAL:  No edema; No deformity  SKIN: Warm and dry NEUROLOGIC:  Alert and oriented x 3 PSYCHIATRIC:  Normal affect   STS Risk Calculator: Isolated AVR: Risk of Mortality: 5.765% Renal Failure: 3.199% Permanent Stroke: 3.856% Prolonged Ventilation: 10.976% DSW Infection: 0.067% Reoperation: 6.023% Morbidity or  Mortality: 17.891% Short Length of Stay: 18.243% Long Length of Stay: 12.387%  ASSESSMENT:    1. Severe aortic stenosis    PLAN:    In order of problems listed above:  24. 85 yo male with Stage D1 severe aortic stenosis and moderate aortic insufficiency, who has NYHA functional class III symptoms of chronic diastolic heart failure. I have reviewed his echo which demonstrates severe calcification and restriction of the aortic valve leaflets with mild-moderate AI. The peak transaortic velocity is 4 meters/second and mean gradient 39 mmHg, calculated AVA 0.9 square centimeters. I have reviewed the natural history of aortic stenosis with the patient today. We have discussed the limitations of medical therapy and the poor prognosis associated with symptomatic aortic stenosis. We have  reviewed potential treatment options, including palliative medical therapy, conventional surgical aortic valve replacement, and transcatheter aortic valve replacement. We discussed treatment options in the context of the patient's specific comorbid medical conditions. Considering his very advanced age, I think TAVR would be his only reasonable treatment option. He strongly desires to move forward with treatment. We reviewed the necessary preoperative workup today at length. This will include R/L heart catheterization with bypass graft angiography, CTA studies of the heart and the chest, abdomen, and pelvis, and formal cardiac surgical consultation as part of a multidisciplinary approach to his care. I have reviewed the risks, indications, and alternatives to cardiac catheterization, possible angioplasty, and stenting with the patient. Risks include but are not limited to bleeding, infection, vascular injury, stroke, myocardial infection, arrhythmia, kidney injury, radiation-related injury in the case of prolonged fluoroscopy use, emergency cardiac surgery, and death. The patient understands the risks of serious complication is 1-2 in 6045 with diagnostic cardiac cath and 1-2% or less with angioplasty/stenting.  Further plans pending test results and surgical consultation.   Medication Adjustments/Labs and Tests Ordered: Current medicines are reviewed at length with the patient today.  Concerns regarding medicines are outlined above.  Orders Placed This Encounter  Procedures  . CT ANGIO ABDOMEN PELVIS  W &/OR WO CONTRAST  . CT CORONARY MORPH W/CTA COR W/SCORE W/CA W/CM &/OR WO/CM  . CT ANGIO CHEST AORTA W &/OR WO CONTRAST  . Basic metabolic panel  . CBC with Differential/Platelet  . VAS US CAROTID   No orders of the defined types were placed in this encounter.   Patient Instructions  Medication Instructions:  Your provider recommends that you continue on your current medications as directed. Please  refer to the Current Medication list given to you today.    Labwork: TODAY! BMET, CBC  You are scheduled for COVID screening April 25, 2019 at 8:05AM. Please arrive 15 minutes early. [facility]  [address]  Make sure to tell them you are there for a procedure!  Testing/Procedures: Your physician has requested that you have a cardiac catheterization. Cardiac catheterization is used to diagnose and/or treat various heart conditions. Doctors may recommend this procedure for a number of different reasons. The most common reason is to evaluate chest pain. Chest pain can be a symptom of coronary artery disease (CAD), and cardiac catheterization can show whether plaque is narrowing or blocking your heart's arteries. This procedure is also used to evaluate the valves, as well as measure the blood flow and oxygen levels in different parts of your heart. For further information please visit [facility]uge[facility]iesta[facility][email or web address]. Please follow instruction sheet, as given.  Follow-Up: Ander Purpura, the TAVR Nure Navigator, will contact you for further arrangements!   CATHETERIZATION INSTRUCTIONS: You are  scheduled for a Cardiac Catheterization on Friday, September 4 with Dr. Sherren Mocha.  1. Please arrive at the [facility] (Main Entrance A) at [facility]: [address] [place], [place] [address] at 5:30 AM (This time is two hours before your procedure to ensure your preparation). You may have ONE visitor. Both you and your visitor must wear masks.  Special note: Every effort is made to have your procedure done on time. Please understand that emergencies sometimes delay scheduled procedures.  2. Diet: Do not eat solid foods after midnight.  The patient may have clear liquids until 5am upon the day of the procedure.  3. Labs: TODAY!  4. Medication instructions in preparation for your procedure:  1) HOLD LASIX the morning of your catheterization  2) MAKE SURE TO TAKE ASPIRIN the morning of  your cath  3) You may take your other meds as directed   5. Plan for one night stay--bring personal belongings. 6. Bring a current list of your medications and current insurance cards. 7. You MUST have a responsible person to drive you home. 8. Someone MUST be with you the first 24 hours after you arrive home or your discharge will be delayed. 9. Please wear clothes that are easy to get on and off and wear slip-on shoes.  Thank you for allowing [place] to care for you!   -- [facility] Invasive Cardiovascular services     Signed, Sherren Mocha, MD  04/15/2019 3:46 PM    [place]

## 2019-04-10 NOTE — Patient Instructions (Addendum)
Medication Instructions:  Your provider recommends that you continue on your current medications as directed. Please refer to the Current Medication list given to you today.    Labwork: TODAY! BMET, CBC  You are scheduled for COVID screening April 25, 2019 at 8:05AM. Please arrive 15 minutes early. [facility]  [address]  Make sure to tell them you are there for a procedure!  Testing/Procedures: Your physician has requested that you have a cardiac catheterization. Cardiac catheterization is used to diagnose and/or treat various heart conditions. Doctors may recommend this procedure for a number of different reasons. The most common reason is to evaluate chest pain. Chest pain can be a symptom of coronary artery disease (CAD), and cardiac catheterization can show whether plaque is narrowing or blocking your heart's arteries. This procedure is also used to evaluate the valves, as well as measure the blood flow and oxygen levels in different parts of your heart. For further information please visit [facility]uge[facility]iesta[facility][email or web address]. Please follow instruction sheet, as given.  Follow-Up: Ander Purpura, the TAVR Nure Navigator, will contact you for further arrangements!   CATHETERIZATION INSTRUCTIONS: You are scheduled for a Cardiac Catheterization on Friday, September 4 with Dr. Sherren Mocha.  1. Please arrive at the [facility] (Main Entrance A) at [facility]: [address] [place], [place] [address] at 5:30 AM (This time is two hours before your procedure to ensure your preparation). You may have ONE visitor. Both you and your visitor must wear masks.  Special note: Every effort is made to have your procedure done on time. Please understand that emergencies sometimes delay scheduled procedures.  2. Diet: Do not eat solid foods after midnight.  The patient may have clear liquids until 5am upon the day of the procedure.  3. Labs: TODAY!  4. Medication instructions in preparation for  your procedure:  1) HOLD LASIX the morning of your catheterization  2) MAKE SURE TO TAKE ASPIRIN the morning of your cath  3) You may take your other meds as directed   5. Plan for one night stay--bring personal belongings. 6. Bring a current list of your medications and current insurance cards. 7. You MUST have a responsible person to drive you home. 8. Someone MUST be with you the first 24 hours after you arrive home or your discharge will be delayed. 9. Please wear clothes that are easy to get on and off and wear slip-on shoes.  Thank you for allowing [place] to care for you!   -- [place] Invasive Cardiovascular services

## 2019-04-10 NOTE — H&P (View-Only) (Signed)
Cardiology Office Note:    Date:  04/15/2019   ID:  Raymond Swanson, DOB 10-07-1924, MRN [id number]  PCP:  Isaac Bliss, Rayford Halsted, MD  Cardiologist:  Minus Breeding, MD  Electrophysiologist:  None   Referring MD: Isaac Bliss, Estel*   Chief Complaint  Patient presents with  . Shortness of Breath    History of Present Illness:    Raymond Swanson is a 85 y.o. male referred by Dr Percival Spanish for further evaluation and treatment of severe aortic stenosis.   He is here with his care giver today. The patient is married and lives independently with his wife. 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 They have a son who lives in [place].   The patient had remote CABG in 1991 by Dr Redmond Pulling. He has not had problems with angina in decades and has not required cardiac catheterization since his bypass surgery. He has been followed for aortic stenosis which has recently progressed and has been associated with dyspnea on exertion. The patient now experiences dyspnea with most activities. He denies chest pain, chest pressure, orthopnea, or PND. The patient ambulates with a walker and has a limited functional capacity. He reports 2 episodes of dizziness and syncope that occurred a few months ago. In review of records, at least one of these episodes was associated with a run of NSVT on outpatient monitoring.   While the patient is quite intelligent and has written several specific questions about TAVR (including valve type), communication is extremely difficult because he is so hard of hearing. His care giver helps with this.   Past Medical History:  Diagnosis Date  . ACUT GASTR ULCER W/HEMORR W/O MENTION OBST 09/10/2009  . ANEMIA 10/08/2009  . Cancer ([place])   . CORONARY ARTERY DISEASE 01/26/2007  . Fall 2016   fall from deck while cleaning gutters; sustained subdemral hematoma ; doing ok now   . History of kidney stones   . HOH (hard of hearing)   . HYPERLIPIDEMIA 01/26/2007  . HYPERTENSION  01/26/2007  . HYPOTHYROIDISM 10/18/2007  . MELENA 08/28/2009  . PROSTATE CANCER, HX OF 01/26/2007  . SKIN CANCER, HX OF 01/26/2007    Past Surgical History:  Procedure Laterality Date  . CATARACT EXTRACTION    . CORONARY ARTERY BYPASS GRAFT     1991; reports at PAT appt 10-11-17: i went to my priamry doctor for a physical and had aroutine stress test that was bad, was sent for cath ( more than 1 but nsure how many or if stents were placed), he rprots " that didnt work so they did the surgery". denies heart symptoms for over 20 years;  sees  cardiology [facility] annually for EKGs   . CYSTOSCOPY W/ URETERAL STENT PLACEMENT Right 10/13/2017   Procedure: CYSTOSCOPY WITH STENT REPLACEMENT;  Surgeon: Ceasar Mons, MD;  Location: [facility];  Service: Urology;  Laterality: Right;  . EXCISION MASS UPPER EXTREMETIES Left 08/11/2016   Procedure: EXCISION MASS LEFT SHOULDER, LEFT FOREARM, LEFT WRIST;  Surgeon: Judeth Horn, MD;  Location: [place];  Service: General;  Laterality: Left;  . HERNIA REPAIR     ingunial  . MOHS SURGERY    . PROSTATE SURGERY     prostatectomy  . TEE WITHOUT CARDIOVERSION N/A 11/17/2017   Procedure: TRANSESOPHAGEAL ECHOCARDIOGRAM (TEE);  Surgeon: Dixie Dials, MD;  Location: [facility];  Service: Cardiovascular;  Laterality: N/A;  . TRANSURETHRAL RESECTION OF BLADDER TUMOR N/A 10/13/2017   Procedure: TRANSURETHRAL RESECTION OF BLADDER TUMOR/  BIPOLAR (TURBT);  Surgeon: Ceasar Mons, MD;  Location: [facility];  Service: Urology;  Laterality: N/A;  ONLY NEEDS 90 MIN FOR BOTH PROCEDURES    Current Medications: Current Meds  Medication Sig  . aspirin EC 81 MG tablet Take 81 mg by mouth daily.  . furosemide (LASIX) 20 MG tablet Take 1 tablet (20 mg total) by mouth daily.  Marland Kitchen levothyroxine (SYNTHROID) 75 MCG tablet Take 1 tablet (75 mcg total) by mouth daily.  . Multiple Vitamins-Minerals (PRESERVISION AREDS 2 PO) Take 1 tablet by mouth daily.  . nitroGLYCERIN (NITROSTAT)  0.4 MG SL tablet Place 1 tablet (0.4 mg total) under the tongue every 5 (five) minutes as needed.  . [DISCONTINUED] carvedilol (COREG) 3.125 MG tablet Take 3.125 mg by mouth as directed. Take 2 tablets in the morning and 1 tablet in the evening     Allergies:   Patient has no known allergies.   Social History   Socioeconomic History  . Marital status: Married    Spouse name: Not on file  . Number of children: Not on file  . Years of education: Not on file  . Highest education level: Not on file  Occupational History  . Not on file  Social Needs  . Financial resource strain: Not on file  . Food insecurity    Worry: Not on file    Inability: Not on file  . Transportation needs    Medical: Not on file    Non-medical: Not on file  Tobacco Use  . Smoking status: Former Smoker    Packs/day: 1.00    Years: 20.00    Pack years: 20.00    Types: Cigarettes    Quit date: 10/28/1948    Years since quitting: 70.5  . Smokeless tobacco: Never Used  Substance and Sexual Activity  . Alcohol use: Yes    Comment: 4 oz. wine daily  . Drug use: No  . Sexual activity: Not on file  Lifestyle  . Physical activity    Days per week: Not on file    Minutes per session: Not on file  . Stress: Not on file  Relationships  . Social Herbalist on phone: Not on file    Gets together: Not on file    Attends religious service: Not on file    Active member of club or organization: Not on file    Attends meetings of clubs or organizations: Not on file    Relationship status: Not on file  Other Topics Concern  . Not on file  Social History Narrative  . Not on file     Family History: The patient's family history includes GI problems in his sister; Lung cancer (age of onset: 66) in his brother.  ROS:   Please see the history of present illness.    Positive for hard of hearing. All other systems reviewed and are negative.  EKGs/Labs/Other Studies Reviewed:    The following studies  were reviewed today: Echo 01/17/2019: IMPRESSIONS    1. The left ventricle has normal systolic function, with an ejection fraction of 55-60%. The cavity size was normal. There is mildly increased left ventricular wall thickness. Left ventricular diastolic Doppler parameters are consistent with impaired  relaxation. Indeterminate filling pressures.  2. The right ventricle has normal systolic function. The cavity was normal. There is no increase in right ventricular wall thickness. Right ventricular systolic pressure is moderately elevated with an estimated pressure of 64.5 mmHg.  3. Left  atrial size was moderately dilated.  4. The mitral valve is degenerative. Mild thickening of the mitral valve leaflet. There is moderate mitral annular calcification present.  5. The aortic valve is abnormal. Severely thickening of the aortic valve. Severe calcifcation of the aortic valve. Aortic valve regurgitation is moderate by color flow Doppler. Severe stenosis of the aortic valve.  6. The interatrial septum appears to be lipomatous.  LEFT VENTRICLE         +----------------+---------++ +--------------+--------++ Diastology                PLAX 2D                +----------------+---------++ +--------------+--------++ LV e' lateral:  8.38 cm/s LVIDd:        3.60 cm  +----------------+---------++ +--------------+--------++ LV E/e' lateral:11.1      LVIDs:        2.60 cm  +----------------+---------++ +--------------+--------++ LV e' medial:   6.74 cm/s LV PW:        0.90 cm  +----------------+---------++ +--------------+--------++ LV E/e' medial: 13.8      LV IVS:       0.90 cm  +----------------+---------++ +--------------+--------++ LVOT diam:    2.20 cm  +--------------+--------++ LV SV:        30 ml    +--------------+--------++ LV SV Index:  18.25    +--------------+--------++ LVOT Area:    3.80 cm +--------------+--------++                         +--------------+--------++  +---------------+----------++ RIGHT VENTRICLE           +---------------+----------++ RV Basal diam: 3.50 cm    +---------------+----------++ RV S prime:    12.50 cm/s +---------------+----------++ TAPSE (M-mode):1.6 cm     +---------------+----------++ RVSP:          57.5 mmHg  +---------------+----------++  +---------------+-------++-----------++ LEFT ATRIUM           Index       +---------------+-------++-----------++ LA diam:       4.70 cm2.82 cm/m  +---------------+-------++-----------++ LA Vol (A2C):  57.8 ml34.63 ml/m +---------------+-------++-----------++ LA Vol (A4C):  59.7 ml35.77 ml/m +---------------+-------++-----------++ LA Biplane Vol:60.9 ml36.49 ml/m +---------------+-------++-----------++ +------------+---------++-----------++ RIGHT ATRIUM         Index       +------------+---------++-----------++ RA Pressure:3.00 mmHg            +------------+---------++-----------++ RA Area:    13.00 cm            +------------+---------++-----------++ RA Volume:  34.70 ml 20.79 ml/m +------------+---------++-----------++  +------------------+------------++ AORTIC VALVE                   +------------------+------------++ AV Area (Vmax):   0.90 cm     +------------------+------------++ AV Area (Vmean):  0.93 cm     +------------------+------------++ AV Area (VTI):    1.14 cm     +------------------+------------++ AV Vmax:          394.00 cm/s  +------------------+------------++ AV Vmean:         280.200 cm/s +------------------+------------++ AV VTI:           0.770 m      +------------------+------------++ AV Peak Grad:     62.1 mmHg    +------------------+------------++ AV Mean Grad:     39.0 mmHg    +------------------+------------++ LVOT Vmax:        93.43 cm/s    +------------------+------------++ LVOT Vmean:       68.333 cm/s  +------------------+------------++ LVOT  VTI:         0.230 m      +------------------+------------++ LVOT/AV VTI ratio:0.30         +------------------+------------++ AR PHT:           222 msec     +------------------+------------++   +-------------+-------++ AORTA                +-------------+-------++ Ao Root diam:3.20 cm +-------------+-------++ Ao Asc diam: 3.20 cm +-------------+-------++  +--------------+----------++  +---------------+-----------++ MITRAL VALVE              TRICUSPID VALVE            +--------------+----------++  +---------------+-----------++ MV Area (PHT):3.77 cm    TR Peak grad:  54.5 mmHg   +--------------+----------++  +---------------+-----------++ MV PHT:       58.29 msec  TR Vmax:       369.00 cm/s +--------------+----------++  +---------------+-----------++ MV Decel Time:201 msec    Estimated RAP: 3.00 mmHg   +--------------+----------++  +---------------+-----------++ +--------------+-----------++ RVSP:          57.5 mmHg   MV E velocity:93.00 cm/s  +---------------+-----------++ +--------------+-----------++ MV A velocity:110.00 cm/s +--------------+-------+ +--------------+-----------++ SHUNTS                MV E/A ratio: 0.85        +--------------+-------+ +--------------+-----------++ Systemic VTI: 0.23 m                                +--------------+-------+                               Systemic Diam:2.20 cm                               +--------------+-------+     EKG:  EKG is not ordered today.  The ekg ordered 02-09-2019 demonstrates NSR with PAC's, LVH  Recent Labs: 07/12/2018: TSH 2.76 04/10/2019: BUN 30; Creatinine, Ser 1.23; Hemoglobin 11.5; Platelets 261; Potassium 4.4; Sodium 144  Recent Lipid Panel    Component Value Date/Time   CHOL 94 11/14/2017 0418   TRIG 43  11/14/2017 0418   HDL 43 11/14/2017 0418   CHOLHDL 2.2 11/14/2017 0418   VLDL 9 11/14/2017 0418   LDLCALC 42 11/14/2017 0418    Physical Exam:    VS:  BP (!) 144/60   Pulse 62   Ht 5\' 8"  (1.727 m)   Wt 119 lb 6.4 oz (54.2 kg)   SpO2 (!) 85%   BMI 18.15 kg/m     Wt Readings from Last 3 Encounters:  04/10/19 119 lb 6.4 oz (54.2 kg)  03/10/19 122 lb (55.3 kg)  02/21/19 123 lb (55.8 kg)     GEN:  Well nourished, well developed elderly male in no acute distress, very hard of hearing HEENT: Normal NECK: No JVD; BL carotid bruits LYMPHATICS: No lymphadenopathy CARDIAC: RRR, 3/6 harsh systolic murmur at the RUSB and 2/6 diastolic decrescendo murmur present RESPIRATORY:  Clear to auscultation without rales, wheezing or rhonchi  ABDOMEN: Soft, non-tender, non-distended MUSCULOSKELETAL:  No edema; No deformity  SKIN: Warm and dry NEUROLOGIC:  Alert and oriented x 3 PSYCHIATRIC:  Normal affect   STS Risk Calculator: Isolated AVR: Risk of Mortality: 5.765% Renal Failure: 3.199% Permanent Stroke: 3.856% Prolonged Ventilation: 10.976% DSW Infection: 0.067% Reoperation: 6.023% Morbidity or  Mortality: 17.891% Short Length of Stay: 18.243% Long Length of Stay: 12.387%  ASSESSMENT:    1. Severe aortic stenosis    PLAN:    In order of problems listed above:  36. 85 yo male with Stage D1 severe aortic stenosis and moderate aortic insufficiency, who has NYHA functional class III symptoms of chronic diastolic heart failure. I have reviewed his echo which demonstrates severe calcification and restriction of the aortic valve leaflets with mild-moderate AI. The peak transaortic velocity is 4 meters/second and mean gradient 39 mmHg, calculated AVA 0.9 square centimeters. I have reviewed the natural history of aortic stenosis with the patient today. We have discussed the limitations of medical therapy and the poor prognosis associated with symptomatic aortic stenosis. We have  reviewed potential treatment options, including palliative medical therapy, conventional surgical aortic valve replacement, and transcatheter aortic valve replacement. We discussed treatment options in the context of the patient's specific comorbid medical conditions. Considering his very advanced age, I think TAVR would be his only reasonable treatment option. He strongly desires to move forward with treatment. We reviewed the necessary preoperative workup today at length. This will include R/L heart catheterization with bypass graft angiography, CTA studies of the heart and the chest, abdomen, and pelvis, and formal cardiac surgical consultation as part of a multidisciplinary approach to his care. I have reviewed the risks, indications, and alternatives to cardiac catheterization, possible angioplasty, and stenting with the patient. Risks include but are not limited to bleeding, infection, vascular injury, stroke, myocardial infection, arrhythmia, kidney injury, radiation-related injury in the case of prolonged fluoroscopy use, emergency cardiac surgery, and death. The patient understands the risks of serious complication is 1-2 in [id number] with diagnostic cardiac cath and 1-2% or less with angioplasty/stenting.  Further plans pending test results and surgical consultation.   Medication Adjustments/Labs and Tests Ordered: Current medicines are reviewed at length with the patient today.  Concerns regarding medicines are outlined above.  Orders Placed This Encounter  Procedures  . CT ANGIO ABDOMEN PELVIS  W &/OR WO CONTRAST  . CT CORONARY MORPH W/CTA COR W/SCORE W/CA W/CM &/OR WO/CM  . CT ANGIO CHEST AORTA W &/OR WO CONTRAST  . Basic metabolic panel  . CBC with Differential/Platelet  . VAS US CAROTID   No orders of the defined types were placed in this encounter.   Patient Instructions  Medication Instructions:  Your provider recommends that you continue on your current medications as directed. Please  refer to the Current Medication list given to you today.    Labwork: TODAY! BMET, CBC  You are scheduled for COVID screening April 25, 2019 at 8:05AM. Please arrive 15 minutes early. [facility]  [address]  Make sure to tell them you are there for a procedure!  Testing/Procedures: Your physician has requested that you have a cardiac catheterization. Cardiac catheterization is used to diagnose and/or treat various heart conditions. Doctors may recommend this procedure for a number of different reasons. The most common reason is to evaluate chest pain. Chest pain can be a symptom of coronary artery disease (CAD), and cardiac catheterization can show whether plaque is narrowing or blocking your heart's arteries. This procedure is also used to evaluate the valves, as well as measure the blood flow and oxygen levels in different parts of your heart. For further information please visit [email or web address]. Please follow instruction sheet, as given.  Follow-Up: Ander Purpura, the TAVR Nure Navigator, will contact you for further arrangements!   CATHETERIZATION INSTRUCTIONS: You are  scheduled for a Cardiac Catheterization on Friday, September 4 with Dr. Sherren Mocha.  1. Please arrive at the [facility] (Main Entrance A) at [facility]: [address] [place], [place] [address] at 5:30 AM (This time is two hours before your procedure to ensure your preparation). You may have ONE visitor. Both you and your visitor must wear masks.  Special note: Every effort is made to have your procedure done on time. Please understand that emergencies sometimes delay scheduled procedures.  2. Diet: Do not eat solid foods after midnight.  The patient may have clear liquids until 5am upon the day of the procedure.  3. Labs: TODAY!  4. Medication instructions in preparation for your procedure:  1) HOLD LASIX the morning of your catheterization  2) MAKE SURE TO TAKE ASPIRIN the morning of  your cath  3) You may take your other meds as directed   5. Plan for one night stay--bring personal belongings. 6. Bring a current list of your medications and current insurance cards. 7. You MUST have a responsible person to drive you home. 8. Someone MUST be with you the first 24 hours after you arrive home or your discharge will be delayed. 9. Please wear clothes that are easy to get on and off and wear slip-on shoes.  Thank you for allowing [place] to care for you!   -- [facility] Invasive Cardiovascular services     Signed, Sherren Mocha, MD  04/15/2019 3:46 PM    [place]

## 2019-04-11 LAB — BASIC METABOLIC PANEL
BUN/Creatinine Ratio: 24 (ref 10–24)
BUN: 30 mg/dL (ref 10–36)
CO2: 29 mmol/L (ref 20–29)
Calcium: 9.9 mg/dL (ref 8.6–10.2)
Chloride: 102 mmol/L (ref 96–106)
Creatinine, Ser: 1.23 mg/dL (ref 0.76–1.27)
GFR calc Af Amer: 59 mL/min/{1.73_m2} — ABNORMAL LOW (ref >59)
GFR calc non Af Amer: 51 mL/min/{1.73_m2} — ABNORMAL LOW (ref >59)
Glucose: 93 mg/dL (ref 65–99)
Potassium: 4.4 mmol/L (ref 3.5–5.2)
Sodium: 144 mmol/L (ref 134–144)

## 2019-04-11 LAB — CBC WITH DIFFERENTIAL/PLATELET
Basophils Absolute: 0 10*3/uL (ref 0.0–0.2)
Basos: 1 %
EOS (ABSOLUTE): 0.2 10*3/uL (ref 0.0–0.4)
Eos: 3 %
Hematocrit: 37.1 % — ABNORMAL LOW (ref 37.5–51.0)
Hemoglobin: 11.5 g/dL — ABNORMAL LOW (ref 13.0–17.7)
Immature Grans (Abs): 0 10*3/uL (ref 0.0–0.1)
Immature Granulocytes: 1 %
Lymphocytes Absolute: 1 10*3/uL (ref 0.7–3.1)
Lymphs: 15 %
MCH: 24.6 pg — ABNORMAL LOW (ref 26.6–33.0)
MCHC: 31 g/dL — ABNORMAL LOW (ref 31.5–35.7)
MCV: 79 fL (ref 79–97)
Monocytes Absolute: 0.8 10*3/uL (ref 0.1–0.9)
Monocytes: 13 %
Neutrophils Absolute: 4.5 10*3/uL (ref 1.4–7.0)
Neutrophils: 67 %
Platelets: 261 10*3/uL (ref 150–450)
RBC: 4.68 x10E6/uL (ref 4.14–5.80)
RDW: 16.5 % — ABNORMAL HIGH (ref 11.6–15.4)
WBC: 6.6 10*3/uL (ref 3.4–10.8)

## 2019-04-14 ENCOUNTER — Other Ambulatory Visit: Payer: Self-pay | Admitting: Cardiology

## 2019-04-14 MED ORDER — CARVEDILOL 3.125 MG PO TABS: 3.1250 mg | ORAL_TABLET | ORAL | 11 refills | Status: DC

## 2019-04-14 NOTE — Telephone Encounter (Signed)
°*  STAT* If patient is at the pharmacy, call can be transferred to refill team.   1. Which medications need to be refilled? (please list name of each medication and dose if known) CARVEDILOL 3.125 //   3X DAILY  2. Which pharmacy/location (including street and city if local pharmacy) is medication to be sent to?  [facility]   3. Do they need a 30 day or 90 day supply? [place]

## 2019-04-17 ENCOUNTER — Telehealth: Payer: Self-pay | Admitting: Cardiology

## 2019-04-17 ENCOUNTER — Encounter (INDEPENDENT_AMBULATORY_CARE_PROVIDER_SITE_OTHER): Payer: Medicare Other | Admitting: Ophthalmology

## 2019-04-17 ENCOUNTER — Other Ambulatory Visit: Payer: Self-pay

## 2019-04-17 DIAGNOSIS — I10 Essential (primary) hypertension: Secondary | ICD-10-CM

## 2019-04-17 DIAGNOSIS — H35033 Hypertensive retinopathy, bilateral: Secondary | ICD-10-CM | POA: Diagnosis not present

## 2019-04-17 DIAGNOSIS — H43813 Vitreous degeneration, bilateral: Secondary | ICD-10-CM | POA: Diagnosis not present

## 2019-04-17 DIAGNOSIS — H353231 Exudative age-related macular degeneration, bilateral, with active choroidal neovascularization: Secondary | ICD-10-CM | POA: Diagnosis not present

## 2019-04-17 NOTE — Telephone Encounter (Signed)
Raymond Swanson will called at [facility] and sig for pts Coreg verified and verbal for #[phone] x3 refills.

## 2019-04-17 NOTE — Telephone Encounter (Signed)
New Message   Jamie from [facility] is calling in to get clarification on medication carvedilol (COREG) 3.125 MG tablet, she states that the prescription that was called in was for 30 pills, but the instructions states to take 3 pills per day. Roselyn Reef states that she need clarification on this prescription. Please call to advise.

## 2019-04-25 ENCOUNTER — Other Ambulatory Visit (HOSPITAL_COMMUNITY)
Admission: RE | Admit: 2019-04-25 | Discharge: 2019-04-25 | Disposition: A | Discharge status: Home / Self Care | Payer: Medicare Other | Source: Ambulatory Visit | Attending: Cardiovascular Disease | Admitting: Cardiovascular Disease

## 2019-04-25 DIAGNOSIS — Z20828 Contact with and (suspected) exposure to other viral communicable diseases: Secondary | ICD-10-CM | POA: Insufficient documentation

## 2019-04-25 DIAGNOSIS — Z01812 Encounter for preprocedural laboratory examination: Secondary | ICD-10-CM | POA: Insufficient documentation

## 2019-04-25 LAB — SARS CORONAVIRUS 2 (TAT 6-24 HRS): SARS Coronavirus 2: NEGATIVE

## 2019-04-26 ENCOUNTER — Telehealth: Payer: Self-pay | Admitting: *Deleted

## 2019-04-26 NOTE — Telephone Encounter (Addendum)
Pt contacted pre-catheterization scheduled at [facility] for: Friday April 28, 2019 7:30 AM Verified arrival time and place: [facility]) at: 5:30 AM   No solid food after midnight prior to cath, clear liquids until 5 AM day of procedure. Contrast allergy: no  Hold: Furosemide-day before and day of procedure-GFR 51   Except hold medications AM meds can be  taken pre-cath with sip of water including: ASA 81 mg   Confirmed patient has responsible person to drive home post procedure and observe 24 hours after arriving home: yes  Currently, due to Covid-19 pandemic, only one support person will be allowed with patient. Must be the same support person for that patient's entire stay, will be screened and required to wear a mask. They will be asked to wait in the waiting room for the duration of the patient's stay.  Patients are required to wear a mask when they enter the hospital.      COVID-19 Pre-Screening Questions:  . In the past 7 to 10 days have you had a cough,  shortness of breath, headache, congestion, fever (100 or greater) body aches, chills, sore throat, or sudden loss of taste or sense of smell? no . Have you been around anyone with known Covid 19? no . Have you been around anyone who is awaiting Covid 19 test results in the past 7 to 10 days? no . Have you been around anyone who has been exposed to Covid 19, or has mentioned symptoms of Covid 19 within the past 7 to 10 days? no   I reviewed procedure/mask/visitor, Covid-19 screening questions with patient's wife (with patient's permission), Sunday Spillers, she verbalized understanding, thanked me for call.

## 2019-04-28 ENCOUNTER — Encounter (HOSPITAL_COMMUNITY)
Admission: RE | Disposition: A | Discharge status: Home / Self Care | Payer: Self-pay | Source: Home / Self Care | Attending: Cardiovascular Disease

## 2019-04-28 ENCOUNTER — Other Ambulatory Visit: Payer: Self-pay | Admitting: Physician Assistant

## 2019-04-28 ENCOUNTER — Encounter (HOSPITAL_COMMUNITY): Payer: Self-pay | Admitting: Cardiovascular Disease

## 2019-04-28 ENCOUNTER — Other Ambulatory Visit: Payer: Self-pay

## 2019-04-28 ENCOUNTER — Encounter: Payer: Self-pay | Admitting: Physician Assistant

## 2019-04-28 ENCOUNTER — Ambulatory Visit (HOSPITAL_COMMUNITY)
Admission: RE | Admit: 2019-04-28 | Discharge: 2019-04-28 | Disposition: A | Discharge status: Home / Self Care | Payer: Medicare Other | Attending: Cardiovascular Disease | Admitting: Cardiovascular Disease

## 2019-04-28 DIAGNOSIS — I350 Nonrheumatic aortic (valve) stenosis: Secondary | ICD-10-CM | POA: Diagnosis not present

## 2019-04-28 DIAGNOSIS — E039 Hypothyroidism, unspecified: Secondary | ICD-10-CM | POA: Insufficient documentation

## 2019-04-28 DIAGNOSIS — Z7982 Long term (current) use of aspirin: Secondary | ICD-10-CM | POA: Diagnosis not present

## 2019-04-28 DIAGNOSIS — Z85828 Personal history of other malignant neoplasm of skin: Secondary | ICD-10-CM | POA: Diagnosis not present

## 2019-04-28 DIAGNOSIS — Z87891 Personal history of nicotine dependence: Secondary | ICD-10-CM | POA: Diagnosis not present

## 2019-04-28 DIAGNOSIS — I272 Pulmonary hypertension, unspecified: Secondary | ICD-10-CM | POA: Insufficient documentation

## 2019-04-28 DIAGNOSIS — I5032 Chronic diastolic (congestive) heart failure: Secondary | ICD-10-CM | POA: Insufficient documentation

## 2019-04-28 DIAGNOSIS — E785 Hyperlipidemia, unspecified: Secondary | ICD-10-CM | POA: Diagnosis not present

## 2019-04-28 DIAGNOSIS — Z8546 Personal history of malignant neoplasm of prostate: Secondary | ICD-10-CM | POA: Insufficient documentation

## 2019-04-28 DIAGNOSIS — I2510 Atherosclerotic heart disease of native coronary artery without angina pectoris: Secondary | ICD-10-CM | POA: Diagnosis not present

## 2019-04-28 DIAGNOSIS — I2582 Chronic total occlusion of coronary artery: Secondary | ICD-10-CM | POA: Insufficient documentation

## 2019-04-28 DIAGNOSIS — I110 Hypertensive heart disease with heart failure: Secondary | ICD-10-CM | POA: Diagnosis not present

## 2019-04-28 DIAGNOSIS — Z79899 Other long term (current) drug therapy: Secondary | ICD-10-CM | POA: Diagnosis not present

## 2019-04-28 DIAGNOSIS — N189 Chronic kidney disease, unspecified: Secondary | ICD-10-CM

## 2019-04-28 DIAGNOSIS — Z951 Presence of aortocoronary bypass graft: Secondary | ICD-10-CM | POA: Insufficient documentation

## 2019-04-28 HISTORY — PX: RIGHT/LEFT HEART CATH AND CORONARY/GRAFT ANGIOGRAPHY: CATH118267

## 2019-04-28 LAB — POCT I-STAT 7, (LYTES, BLD GAS, ICA,H+H)
Acid-Base Excess: 2 mmol/L (ref 0.0–2.0)
Bicarbonate: 28.2 mmol/L — ABNORMAL HIGH (ref 20.0–28.0)
Calcium, Ion: 1.25 mmol/L (ref 1.15–1.40)
HCT: 34 % — ABNORMAL LOW (ref 39.0–52.0)
Hemoglobin: 11.6 g/dL — ABNORMAL LOW (ref 13.0–17.0)
O2 Saturation: 99 %
Potassium: 4 mmol/L (ref 3.5–5.1)
Sodium: 142 mmol/L (ref 135–145)
TCO2: 30 mmol/L (ref 22–32)
pCO2 arterial: 49 mmHg — ABNORMAL HIGH (ref 32.0–48.0)
pH, Arterial: 7.368 (ref 7.350–7.450)
pO2, Arterial: 157 mmHg — ABNORMAL HIGH (ref 83.0–108.0)

## 2019-04-28 LAB — POCT I-STAT EG7
Acid-Base Excess: 1 mmol/L (ref 0.0–2.0)
Bicarbonate: 28.2 mmol/L — ABNORMAL HIGH (ref 20.0–28.0)
Calcium, Ion: 1.22 mmol/L (ref 1.15–1.40)
HCT: 33 % — ABNORMAL LOW (ref 39.0–52.0)
Hemoglobin: 11.2 g/dL — ABNORMAL LOW (ref 13.0–17.0)
O2 Saturation: 64 %
Potassium: 3.8 mmol/L (ref 3.5–5.1)
Sodium: 143 mmol/L (ref 135–145)
TCO2: 30 mmol/L (ref 22–32)
pCO2, Ven: 53.3 mmHg (ref 44.0–60.0)
pH, Ven: 7.331 (ref 7.250–7.430)
pO2, Ven: 36 mmHg (ref 32.0–45.0)

## 2019-04-28 SURGERY — RIGHT/LEFT HEART CATH AND CORONARY/GRAFT ANGIOGRAPHY
Anesthesia: LOCAL

## 2019-04-28 MED ORDER — HEPARIN (PORCINE) IN NACL 1000-0.9 UT/500ML-% IV SOLN
INTRAVENOUS | Status: AC
  Filled 2019-04-28: qty 500

## 2019-04-28 MED ORDER — HEPARIN SODIUM (PORCINE) 1000 UNIT/ML IJ SOLN
INTRAMUSCULAR | Status: AC
  Filled 2019-04-28: qty 1

## 2019-04-28 MED ORDER — MIDAZOLAM HCL 2 MG/2ML IJ SOLN
INTRAMUSCULAR | Status: DC | PRN
  Administered 2019-04-28: 1 mg via INTRAVENOUS

## 2019-04-28 MED ORDER — ASPIRIN 81 MG PO CHEW: 81.0000 mg | CHEWABLE_TABLET | ORAL | Status: DC

## 2019-04-28 MED ORDER — SODIUM CHLORIDE 0.9 % WEIGHT BASED INFUSION: 1.0000 mL/kg/h | INTRAVENOUS | Status: DC

## 2019-04-28 MED ORDER — HEPARIN (PORCINE) IN NACL 1000-0.9 UT/500ML-% IV SOLN
INTRAVENOUS | Status: DC | PRN
  Administered 2019-04-28 (×3): 500 mL

## 2019-04-28 MED ORDER — SODIUM CHLORIDE 0.9% FLUSH: 3.0000 mL | Freq: Two times a day (BID) | INTRAVENOUS | Status: DC

## 2019-04-28 MED ORDER — SODIUM CHLORIDE 0.9 % IV SOLN: INTRAVENOUS | Status: DC

## 2019-04-28 MED ORDER — VERAPAMIL HCL 2.5 MG/ML IV SOLN
INTRAVENOUS | Status: AC
  Filled 2019-04-28: qty 2

## 2019-04-28 MED ORDER — LIDOCAINE HCL (PF) 1 % IJ SOLN
INTRAMUSCULAR | Status: DC | PRN
  Administered 2019-04-28: 2 mL via SUBCUTANEOUS

## 2019-04-28 MED ORDER — LABETALOL HCL 5 MG/ML IV SOLN: 10.0000 mg | INTRAVENOUS | Status: DC | PRN

## 2019-04-28 MED ORDER — HEPARIN SODIUM (PORCINE) 1000 UNIT/ML IJ SOLN
INTRAMUSCULAR | Status: DC | PRN
  Administered 2019-04-28: 3000 [IU] via INTRAVENOUS

## 2019-04-28 MED ORDER — SODIUM CHLORIDE 0.9% FLUSH: 3.0000 mL | INTRAVENOUS | Status: DC | PRN

## 2019-04-28 MED ORDER — MIDAZOLAM HCL 2 MG/2ML IJ SOLN
INTRAMUSCULAR | Status: AC
  Filled 2019-04-28: qty 2

## 2019-04-28 MED ORDER — SODIUM CHLORIDE 0.9 % IV SOLN: 250.0000 mL | INTRAVENOUS | Status: DC | PRN

## 2019-04-28 MED ORDER — ONDANSETRON HCL 4 MG/2ML IJ SOLN: 4.0000 mg | Freq: Four times a day (QID) | INTRAMUSCULAR | Status: DC | PRN

## 2019-04-28 MED ORDER — SODIUM CHLORIDE 0.9 % WEIGHT BASED INFUSION
3.0000 mL/kg/h | INTRAVENOUS | Status: AC
  Administered 2019-04-28: 3 mL/kg/h via INTRAVENOUS

## 2019-04-28 MED ORDER — LIDOCAINE HCL (PF) 1 % IJ SOLN
INTRAMUSCULAR | Status: AC
  Filled 2019-04-28: qty 30

## 2019-04-28 MED ORDER — HYDRALAZINE HCL 20 MG/ML IJ SOLN: 10.0000 mg | INTRAMUSCULAR | Status: DC | PRN

## 2019-04-28 MED ORDER — VERAPAMIL HCL 2.5 MG/ML IV SOLN
INTRAVENOUS | Status: DC | PRN
  Administered 2019-04-28: 10 mL via INTRA_ARTERIAL

## 2019-04-28 MED ORDER — HEPARIN (PORCINE) IN NACL 1000-0.9 UT/500ML-% IV SOLN
INTRAVENOUS | Status: AC
  Filled 2019-04-28: qty 1000

## 2019-04-28 MED ORDER — ACETAMINOPHEN 325 MG PO TABS: 650.0000 mg | ORAL_TABLET | ORAL | Status: DC | PRN

## 2019-04-28 SURGICAL SUPPLY — 16 items
CATH BALLN WEDGE 5F 110CM (CATHETERS) ×1 IMPLANT
CATH EXPO 5F MPA-1 (CATHETERS) ×1 IMPLANT
CATH INFINITI 5FR MULTPACK ANG (CATHETERS) ×1 IMPLANT
DEVICE RAD TR BAND REGULAR (VASCULAR PRODUCTS) ×1 IMPLANT
GLIDESHEATH SLEND SS 6F .021 (SHEATH) ×1 IMPLANT
GUIDEWIRE .025 260CM (WIRE) ×1 IMPLANT
GUIDEWIRE ANGLED .035X150CM (WIRE) ×1 IMPLANT
GUIDEWIRE INQWIRE 1.5J.035X260 (WIRE) IMPLANT
INQWIRE 1.5J .035X260CM (WIRE) ×2
KIT HEART LEFT (KITS) ×2 IMPLANT
PACK CARDIAC CATHETERIZATION (CUSTOM PROCEDURE TRAY) ×2 IMPLANT
SHEATH GLIDE SLENDER 4/5FR (SHEATH) ×1 IMPLANT
STOPCOCK MORSE 400PSI 3WAY (MISCELLANEOUS) ×1 IMPLANT
TRANSDUCER W/STOPCOCK (MISCELLANEOUS) ×2 IMPLANT
TUBING CIL FLEX 10 FLL-RA (TUBING) ×2 IMPLANT
WIRE HI TORQ VERSACORE-J 145CM (WIRE) ×1 IMPLANT

## 2019-04-28 NOTE — Progress Notes (Signed)
Per Sandria Senter client advised to not use his walker x 2 days and client voiced understanding and caregiver [name] notified

## 2019-04-28 NOTE — Discharge Instructions (Signed)
Radial Site Care  This sheet gives you information about how to care for yourself after your procedure. Your health care provider may also give you more specific instructions. If you have problems or questions, contact your health care provider. What can I expect after the procedure? After the procedure, it is common to have:  Bruising and tenderness at the catheter insertion area. Follow these instructions at home: Medicines  Take over-the-counter and prescription medicines only as told by your health care provider. Insertion site care  Follow instructions from your health care provider about how to take care of your insertion site. Make sure you: ? Wash your hands with soap and water before you change your bandage (dressing). If soap and water are not available, use hand sanitizer. ? Change your dressing as told by your health care provider. ? Leave stitches (sutures), skin glue, or adhesive strips in place. These skin closures may need to stay in place for 2 weeks or longer. If adhesive strip edges start to loosen and curl up, you may trim the loose edges. Do not remove adhesive strips completely unless your health care provider tells you to do that.  Check your insertion site every day for signs of infection. Check for: ? Redness, swelling, or pain. ? Fluid or blood. ? Pus or a bad smell. ? Warmth.  Do not take baths, swim, or use a hot tub until your health care provider approves.  You may shower 24-48 hours after the procedure, or as directed by your health care provider. ? Remove the dressing and gently wash the site with plain soap and water. ? Pat the area dry with a clean towel. ? Do not rub the site. That could cause bleeding.  Do not apply powder or lotion to the site. Activity   For 24 hours after the procedure, or as directed by your health care provider: ? Do not flex or bend the affected arm. ? Do not push or pull heavy objects with the affected arm. ? Do not  drive yourself home from the hospital or clinic. You may drive 24 hours after the procedure unless your health care provider tells you not to. ? Do not operate machinery or power tools.  Do not lift anything that is heavier than 10 lb (4.5 kg), or the limit that you are told, until your health care provider says that it is safe.  Ask your health care provider when it is okay to: ? Return to work or school. ? Resume usual physical activities or sports. ? Resume sexual activity. General instructions  If the catheter site starts to bleed, raise your arm and put firm pressure on the site. If the bleeding does not stop, get help right away. This is a medical emergency.  If you went home on the same day as your procedure, a responsible adult should be with you for the first 24 hours after you arrive home.  Keep all follow-up visits as told by your health care provider. This is important. Contact a health care provider if:  You have a fever.  You have redness, swelling, or yellow drainage around your insertion site. Get help right away if:  You have unusual pain at the radial site.  The catheter insertion area swells very fast.  The insertion area is bleeding, and the bleeding does not stop when you hold steady pressure on the area.  Your arm or hand becomes pale, cool, tingly, or numb. These symptoms may represent a serious problem   that is an emergency. Do not wait to see if the symptoms will go away. Get medical help right away. Call your local emergency services (911 in the [place] Do not drive yourself to the hospital. Summary  After the procedure, it is common to have bruising and tenderness at the site.  Follow instructions from your health care provider about how to take care of your radial site wound. Check the wound every day for signs of infection.  Do not lift anything that is heavier than 10 lb (4.5 kg), or the limit that you are told, until your health care provider says  that it is safe. This information is not intended to replace advice given to you by your health care provider. Make sure you discuss any questions you have with your health care provider. Document Released: 09/12/2010 Document Revised: 09/15/2017 Document Reviewed: 09/15/2017 Elsevier Patient Education  2020 [employer]  Moderate Conscious Sedation, Adult, Care After These instructions provide you with information about caring for yourself after your procedure. Your health care provider may also give you more specific instructions. Your treatment has been planned according to current medical practices, but problems sometimes occur. Call your health care provider if you have any problems or questions after your procedure. What can I expect after the procedure? After your procedure, it is common:  To feel sleepy for several hours.  To feel clumsy and have poor balance for several hours.  To have poor judgment for several hours.  To vomit if you eat too soon. Follow these instructions at home: For at least 24 hours after the procedure:   Do not: ? Participate in activities where you could fall or become injured. ? Drive. ? Use heavy machinery. ? Drink alcohol. ? Take sleeping pills or medicines that cause drowsiness. ? Make important decisions or sign legal documents. ? Take care of children on your own.  Rest. Eating and drinking  Follow the diet recommended by your health care provider.  If you vomit: ? Drink water, juice, or soup when you can drink without vomiting. ? Make sure you have little or no nausea before eating solid foods. General instructions  Have a responsible adult stay with you until you are awake and alert.  Take over-the-counter and prescription medicines only as told by your health care provider.  If you smoke, do not smoke without supervision.  Keep all follow-up visits as told by your health care provider. This is important. Contact a health care  provider if:  You keep feeling nauseous or you keep vomiting.  You feel light-headed.  You develop a rash.  You have a fever. Get help right away if:  You have trouble breathing. This information is not intended to replace advice given to you by your health care provider. Make sure you discuss any questions you have with your health care provider. Document Released: 05/31/2013 Document Revised: 07/23/2017 Document Reviewed: 11/30/2015 Elsevier Patient Education  2020 [employer]  

## 2019-04-28 NOTE — Interval H&P Note (Signed)
History and Physical Interval Note:  04/28/2019 7:34 AM  Raymond Swanson  has presented today for surgery, with the diagnosis of arotic stenosis.  The various methods of treatment have been discussed with the patient and family. After consideration of risks, benefits and other options for treatment, the patient has consented to  Procedure(s): RIGHT/LEFT HEART CATH AND CORONARY/GRAFT ANGIOGRAPHY (N/A) as a surgical intervention.  The patient's history has been reviewed, patient examined, no change in status, stable for surgery.  I have reviewed the patient's chart and labs.  Questions were answered to the patient's satisfaction.     Sherren Mocha

## 2019-05-03 ENCOUNTER — Other Ambulatory Visit: Payer: Self-pay | Admitting: Internal Medicine

## 2019-05-03 ENCOUNTER — Encounter: Payer: Self-pay | Admitting: Physician Assistant

## 2019-05-03 ENCOUNTER — Other Ambulatory Visit: Payer: Medicare Other | Admitting: *Deleted

## 2019-05-03 DIAGNOSIS — N189 Chronic kidney disease, unspecified: Secondary | ICD-10-CM | POA: Diagnosis not present

## 2019-05-03 DIAGNOSIS — G2581 Restless legs syndrome: Secondary | ICD-10-CM

## 2019-05-03 LAB — BASIC METABOLIC PANEL
BUN/Creatinine Ratio: 24 (ref 10–24)
BUN: 29 mg/dL (ref 10–36)
CO2: 30 mmol/L — ABNORMAL HIGH (ref 20–29)
Calcium: 10.5 mg/dL — ABNORMAL HIGH (ref 8.6–10.2)
Chloride: 103 mmol/L (ref 96–106)
Creatinine, Ser: 1.23 mg/dL (ref 0.76–1.27)
GFR calc Af Amer: 59 mL/min/{1.73_m2} — ABNORMAL LOW (ref >59)
GFR calc non Af Amer: 51 mL/min/{1.73_m2} — ABNORMAL LOW (ref >59)
Glucose: 106 mg/dL — ABNORMAL HIGH (ref 65–99)
Potassium: 4.6 mmol/L (ref 3.5–5.2)
Sodium: 141 mmol/L (ref 134–144)

## 2019-05-09 ENCOUNTER — Encounter: Payer: Self-pay | Admitting: *Deleted

## 2019-05-10 ENCOUNTER — Other Ambulatory Visit: Payer: Self-pay

## 2019-05-10 ENCOUNTER — Ambulatory Visit (HOSPITAL_COMMUNITY)
Admission: RE | Admit: 2019-05-10 | Discharge: 2019-05-10 | Disposition: A | Discharge status: Home / Self Care | Payer: Medicare Other | Source: Ambulatory Visit | Attending: Cardiovascular Disease | Admitting: Cardiovascular Disease

## 2019-05-10 ENCOUNTER — Ambulatory Visit (HOSPITAL_COMMUNITY)
Admit: 2019-05-10 | Discharge: 2019-05-10 | Disposition: A | Discharge status: Home / Self Care | Payer: Medicare Other | Attending: Cardiovascular Disease | Admitting: Cardiovascular Disease

## 2019-05-10 ENCOUNTER — Encounter (HOSPITAL_COMMUNITY): Payer: Self-pay

## 2019-05-10 ENCOUNTER — Ambulatory Visit (HOSPITAL_COMMUNITY): Payer: Medicare Other

## 2019-05-10 DIAGNOSIS — I350 Nonrheumatic aortic (valve) stenosis: Secondary | ICD-10-CM | POA: Insufficient documentation

## 2019-05-10 DIAGNOSIS — K8020 Calculus of gallbladder without cholecystitis without obstruction: Secondary | ICD-10-CM | POA: Diagnosis not present

## 2019-05-10 DIAGNOSIS — Z01818 Encounter for other preprocedural examination: Secondary | ICD-10-CM | POA: Diagnosis not present

## 2019-05-10 MED ORDER — IOHEXOL 350 MG/ML SOLN
100.0000 mL | Freq: Once | INTRAVENOUS | Status: AC | PRN
  Administered 2019-05-10: 100 mL via INTRAVENOUS

## 2019-05-10 NOTE — Progress Notes (Signed)
Bilateral carotid duplex completed. Preliminary results in Chart review CV Proc. [place] Takera Rayl,RVS 05/10/2019, 10:10 am

## 2019-05-10 NOTE — Discharge Instructions (Signed)
Testing With IV Contrast Material °IV contrast material is a fluid that is used with some imaging tests. It is injected into your body through a vein. Contrast material is used when your health care providers need a detailed look at organs, tissues, or blood vessels that may not show up with the standard test. The material may be used when an X-ray, an [name], a CT scan, or an ultrasound is done. °IV contrast material may be used for imaging tests that check: °· Muscles, skin, and fat. °· Breasts. °· Brain. °· Digestive tract. °· Heart. °· Organs such as the liver, kidneys, lungs, bladder, and many others. °· Arteries and veins. °Tell a health care provider about: °· Any allergies you have, especially an allergy to contrast material. °· All medicines you are taking, including metformin, beta blockers, NSAIDs (such as ibuprofen), interleukin-2, vitamins, herbs, eye drops, creams, and over-the-counter medicines. °· Any problems you or family members have had with the use of contrast material. °· Any blood disorders you have, such as sickle cell anemia. °· Any surgeries you have had. °· Any medical conditions you have or have had, especially alcohol abuse, dehydration, asthma, or kidney, liver, or heart problems. °· Whether you are pregnant or may be pregnant. °· Whether you are breastfeeding. Most contrast materials are safe for use in breastfeeding women. °What are the risks? °Generally, this is a safe procedure. However, problems may occur, including: °· Headache. °· Itching, skin rash, and hives. °· Nausea and vomiting. °· Allergic reactions. °· Wheezing or difficulty breathing. °· Abnormal heart rate. °· Changes in blood pressure. °· Throat swelling. °· Kidney damage. °What happens before the procedure? °Medicines °Ask your health care provider about: °· Changing or stopping your regular medicines. This is especially important if you are taking diabetes medicines or blood thinners. °· Taking medicines such as aspirin  and ibuprofen. These medicines can thin your blood. Do not take these medicines unless your health care provider tells you to take them. °· Taking over-the-counter medicines, vitamins, herbs, and supplements. °If you are at risk of having a reaction to the IV contrast material, you may be asked to take medicine before the procedure to prevent a reaction. °General instructions °· Follow instructions from your health care provider about eating or drinking restrictions. °· You may have an exam or lab tests to make sure that you can safely get IV contrast material. °· Ask if you will be given a medicine to help you relax (sedative) during the procedure. If so, plan to have someone take you home from the hospital or clinic. °What happens during the procedure? °· You may be given a sedative to help you relax. °· An IV will be inserted into one of your veins. °· Contrast material will be injected into your IV. °· You may feel warmth or flushing as the contrast material enters your bloodstream. °· You may have a metallic taste in your mouth for a few minutes. °· The needle may cause some discomfort and bruising. °· After the contrast material is in your body, the imaging test will be done. °The procedure may vary among health care providers and hospitals. °What can I expect after the procedure? °· The IV will be removed. °· You may be taken to a recovery area if sedation medicines were used. Your blood pressure, heart rate, breathing rate, and blood oxygen level will be monitored until you leave the hospital or clinic. °Follow these instructions at home: ° °· Take over-the-counter and   prescription medicines only as told by your health care provider. ? Your health care provider may tell you to not take certain medicines for a couple of days after the procedure. This is especially important if you are taking diabetes medicines.  If you are told, drink enough fluid to keep your urine pale yellow. This will help to remove  the contrast material out of your body.  Do not drive for 24 hours if you were given a sedative during your procedure.  It is up to you to get the results of your procedure. Ask your health care provider, or the department that is doing the procedure, when your results will be ready.  Keep all follow-up visits as told by your health care provider. This is important. Contact a health care provider if:  You have redness, swelling, or pain near your IV site. Get help right away if:  You have an abnormal heart rhythm.  You have trouble breathing.  You have: ? Chest pain. ? Pain in your back, neck, arm, jaw, or stomach. ? Nausea or sweating. ? Hives or a rash.  You start shaking and cannot stop. These symptoms may represent a serious problem that is an emergency. Do not wait to see if the symptoms will go away. Get medical help right away. Call your local emergency services (911 in the [place] Do not drive yourself to the hospital. Summary  IV contrast material may be used for imaging tests to help your health care providers see your organs and tissues more clearly.  Tell your health care provider if you are pregnant or may be pregnant.  During the procedure, you may feel warmth or flushing as the contrast material enters your bloodstream.  After the procedure, drink enough fluid to keep your urine pale yellow. This information is not intended to replace advice given to you by your health care provider. Make sure you discuss any questions you have with your health care provider. Document Released: 07/29/2009 Document Revised: 10/27/2018 Document Reviewed: 10/27/2018 Elsevier Patient Education  2020 [employer].

## 2019-05-15 ENCOUNTER — Encounter: Payer: Self-pay | Admitting: Physician Assistant

## 2019-05-16 ENCOUNTER — Other Ambulatory Visit: Payer: Self-pay

## 2019-05-16 ENCOUNTER — Encounter: Payer: Self-pay | Admitting: Physician Assistant

## 2019-05-16 ENCOUNTER — Institutional Professional Consult (permissible substitution) (INDEPENDENT_AMBULATORY_CARE_PROVIDER_SITE_OTHER): Payer: Medicare Other | Admitting: Thoracic Surgery (Cardiothoracic Vascular Surgery)

## 2019-05-16 ENCOUNTER — Other Ambulatory Visit: Payer: Self-pay | Admitting: Physician Assistant

## 2019-05-16 ENCOUNTER — Ambulatory Visit: Payer: Medicare Other | Attending: Cardiovascular Disease | Admitting: Physical Therapy

## 2019-05-16 ENCOUNTER — Encounter: Payer: Self-pay | Admitting: Thoracic Surgery (Cardiothoracic Vascular Surgery)

## 2019-05-16 ENCOUNTER — Ambulatory Visit: Payer: Medicare Other | Admitting: Physical Therapy

## 2019-05-16 VITALS — BP 115/66 | HR 74 | Temp 97.9°F | Resp 16 | Ht 68.0 in | Wt 122.0 lb

## 2019-05-16 DIAGNOSIS — I350 Nonrheumatic aortic (valve) stenosis: Secondary | ICD-10-CM | POA: Diagnosis not present

## 2019-05-16 DIAGNOSIS — R2689 Other abnormalities of gait and mobility: Secondary | ICD-10-CM | POA: Insufficient documentation

## 2019-05-16 NOTE — Progress Notes (Signed)
HEART AND [place] SURGERY CONSULTATION REPORT  Primary Cardiologist is Minus Breeding, MD PCP is Isaac Bliss, Rayford Halsted, MD  Chief Complaint  Patient presents with   Aortic Stenosis    TAVR EVAL and review all required competed studies/procedures...OP PT APPOINTMENT is after Dr. Guy Sandifer visit    HPI:  Patient is a 85 year old male with history of aortic stenosis, coronary artery disease status post coronary artery bypass grafting in the remote past, hypertension, hyperlipidemia, hypothyroidism, and prostate cancer who has been referred for surgical consultation to discuss treatment options for management of severe symptomatic aortic stenosis.  Patient's cardiac history dates back to 1991 when he was found to have three-vessel coronary artery disease after having undergone routine exercise treadmill stress test.  He underwent coronary artery bypass grafting x3 by Dr. Redmond Pulling.  He has done well from a cardiac standpoint ever since and for several years has been followed carefully by Dr. Percival Spanish.  Echocardiograms have documented the presence of preserved left ventricular function with aortic stenosis that has gradually progressed in severity.  The patient was recently evaluated by Dr. Percival Spanish and reported to syncopal episodes.  He has a long history of exertional shortness of breath that has progressed substantially over the past year.  His appetite is been down and he has lost a fair amount of weight.  He describes to frank syncopal episodes without any associated chest pain.  Follow-up transthoracic echocardiogram performed Jan 17, 2019 revealed normal left ventricular systolic function with severe aortic stenosis and moderate aortic insufficiency.  He was referred to the multidisciplinary heart valve clinic and has been evaluated previously by Dr. Burt Knack.  Diagnostic cardiac catheterization was performed April 28, 2019 and  revealed severe native coronary artery disease with continued patency of bypass grafts in all 3 major vascular territories.  The aortic valve was not crossed.  There was moderate pulmonary hypertension.  CT angiography was performed and the patient referred for surgical consultation.  Patient is married and lives with his wife locally in [place].  His wife is reportedly frail and suffers from mild dementia.  Despite this the patient and his wife remain functionally independent.  Up until the patient's recent syncopal episode he had been driving an automobile, but he stopped driving at the insistence of Dr. Percival Spanish.  He admits to progressive exertional shortness of breath and fatigue.  He gets short of breath with very low level activity but he denies resting shortness of breath.  He has not had any chest pain or chest tightness either with activity or at rest.  He denies PND, orthopnea, or lower extremity edema.  He has developed poor appetite and lost weight.  He admits that his strength is very poor and when he passed out he could not get back up off of the ground because of generalized weakness.  He offers suffered a mechanical fall approximately 1 year ago, and again at that time he could not get back up after he had fallen despite no significant physical injuries.  He has begun ambulating using a cane or a walker, and he now uses a walker whenever he leaves the house.  The patient is very hard of hearing and has some difficulty comprehending if he is not allowed to read lips.  He is accompanied for his office consultation visit by a friend who the patient employs for assistance whenever he needs to get out of the house for appointments.  He has a single  adult son who lives in [place].  Past Medical History:  Diagnosis Date   Acute gastric ulcer with hemorrhage, without mention of obstruction 09/10/2009   ANEMIA 10/08/2009   CORONARY ARTERY DISEASE 01/26/2007   s/p remote CABG x3V   History of  kidney stones    HOH (hard of hearing)    Hx of bladder cancer    s/p TURBT by Dr. Lovena Neighbours   HYPERLIPIDEMIA 01/26/2007   HYPERTENSION 01/26/2007   HYPOTHYROIDISM 10/18/2007   PROSTATE CANCER, HX OF 01/26/2007   SKIN CANCER, HX OF 01/26/2007   Subdural hematoma ([place]) 2016   fall from deck while cleaning gutters; sustained subdemral hematoma ; doing ok now     Past Surgical History:  Procedure Laterality Date   CATARACT EXTRACTION     CORONARY ARTERY BYPASS GRAFT     1991; reports at PAT appt 10-11-17: i went to my priamry doctor for a physical and had aroutine stress test that was bad, was sent for cath ( more than 1 but nsure how many or if stents were placed), he rprots " that didnt work so they did the surgery". denies heart symptoms for over 20 years;  sees  cardiology [facility] annually for EKGs    CYSTOSCOPY W/ URETERAL STENT PLACEMENT Right 10/13/2017   Procedure: CYSTOSCOPY WITH STENT REPLACEMENT;  Surgeon: Ceasar Mons, MD;  Location: [facility];  Service: Urology;  Laterality: Right;   EXCISION MASS UPPER EXTREMETIES Left 08/11/2016   Procedure: EXCISION MASS LEFT SHOULDER, LEFT FOREARM, LEFT WRIST;  Surgeon: Judeth Horn, MD;  Location: [facility];  Service: General;  Laterality: Left;   HERNIA REPAIR     ingunial   MOHS SURGERY     PROSTATE SURGERY     prostatectomy   RIGHT/LEFT HEART CATH AND CORONARY/GRAFT ANGIOGRAPHY N/A 04/28/2019   Procedure: RIGHT/LEFT HEART CATH AND CORONARY/GRAFT ANGIOGRAPHY;  Surgeon: Sherren Mocha, MD;  Location: [facility];  Service: Cardiovascular;  Laterality: N/A;   TEE WITHOUT CARDIOVERSION N/A 11/17/2017   Procedure: TRANSESOPHAGEAL ECHOCARDIOGRAM (TEE);  Surgeon: Dixie Dials, MD;  Location: [facility];  Service: Cardiovascular;  Laterality: N/A;   TRANSURETHRAL RESECTION OF BLADDER TUMOR N/A 10/13/2017   Procedure: TRANSURETHRAL RESECTION OF BLADDER TUMOR/ BIPOLAR (TURBT);  Surgeon: Ceasar Mons, MD;   Location: [facility];  Service: Urology;  Laterality: N/A;  ONLY NEEDS 90 MIN FOR BOTH PROCEDURES    Family History  Problem Relation Age of Onset   Lung cancer Brother 36   GI problems Sister     Social History   Socioeconomic History   Marital status: Married    Spouse name: Not on file   Number of children: Not on file   Years of education: Not on file   Highest education level: Not on file  Occupational History   Not on file  Social Needs   Financial resource strain: Not on file   Food insecurity    Worry: Not on file    Inability: Not on file   Transportation needs    Medical: Not on file    Non-medical: Not on file  Tobacco Use   Smoking status: Former Smoker    Packs/day: 1.00    Years: 20.00    Pack years: 20.00    Types: Cigarettes    Quit date: 10/28/1948    Years since quitting: 70.5   Smokeless tobacco: Never Used  Substance and Sexual Activity   Alcohol use: Yes    Comment: 4 oz. wine  daily   Drug use: No   Sexual activity: Not on file  Lifestyle   Physical activity    Days per week: Not on file    Minutes per session: Not on file   Stress: Not on file  Relationships   Social connections    Talks on phone: Not on file    Gets together: Not on file    Attends religious service: Not on file    Active member of club or organization: Not on file    Attends meetings of clubs or organizations: Not on file    Relationship status: Not on file   Intimate partner violence    Fear of current or ex partner: Not on file    Emotionally abused: Not on file    Physically abused: Not on file    Forced sexual activity: Not on file  Other Topics Concern   Not on file  Social History Narrative   Not on file    Current Outpatient Medications  Medication Sig Dispense Refill   acetaminophen (TYLENOL) 500 MG tablet Take 500-1,000 mg by mouth every 6 (six) hours as needed for moderate pain or headache.     aspirin EC 81 MG tablet Take 81 mg by  mouth at bedtime.      CALCIUM PO Take 1 capsule by mouth daily.     carboxymethylcellulose (REFRESH TEARS) 0.5 % SOLN Place 1 drop into both eyes 3 (three) times daily as needed (dry eyes).     carvedilol (COREG) 3.125 MG tablet Take 1 tablet (3.125 mg total) by mouth as directed. Take 2 tablets in the morning and 1 tablet in the evening (Patient taking differently: Take 3.125 mg by mouth as directed. Take 6.25 mg in the morning and 3.125 in the evening) 30 tablet 11   Cyanocobalamin (B-12 PO) Take 1 capsule by mouth daily.     furosemide (LASIX) 20 MG tablet Take 1 tablet (20 mg total) by mouth daily. (Patient taking differently: Take 20 mg by mouth daily with lunch. ) 90 tablet 1   levothyroxine (SYNTHROID) 75 MCG tablet Take 1 tablet (75 mcg total) by mouth daily. (Patient taking differently: Take 75 mcg by mouth daily before breakfast. ) 90 tablet 1   MELATONIN PO Take 1 tablet by mouth at bedtime.      Multiple Vitamin (MULTIVITAMIN WITH MINERALS) TABS tablet Take 1 tablet by mouth daily.     nitroGLYCERIN (NITROSTAT) 0.4 MG SL tablet Place 1 tablet (0.4 mg total) under the tongue every 5 (five) minutes as needed. (Patient taking differently: Place 0.4 mg under the tongue every 5 (five) minutes as needed for chest pain. ) 20 tablet 5   pramipexole (MIRAPEX) 0.25 MG tablet TAKE 1 TABLET(0.25 MG) BY MOUTH AT BEDTIME 90 tablet 1   VITAMIN D PO Take 1 capsule by mouth daily.     Multiple Vitamins-Minerals (PRESERVISION AREDS 2 PO) Take 1 tablet by mouth 2 (two) times daily.      No current facility-administered medications for this visit.     No Known Allergies    Review of Systems:   General:  poor appetite, decreased energy, no weight gain, + weight loss, no fever  Cardiac:  no chest pain with exertion, no chest pain at rest, +SOB with exertion, no resting SOB, no PND, no orthopnea, no palpitations, no arrhythmia, no atrial fibrillation, no LE edema, + dizzy spells, +  syncope  Respiratory:  + shortness of breath, no home oxygen, no  productive cough, no dry cough, no bronchitis, no wheezing, no hemoptysis, no asthma, no pain with inspiration or cough, no sleep apnea, no CPAP at night  GI:   no difficulty swallowing, no reflux, no frequent heartburn, no hiatal hernia, no abdominal pain, no constipation, no diarrhea, no hematochezia, no hematemesis, no melena  GU:   no dysuria,  + frequency, no urinary tract infection, no hematuria, no enlarged prostate, no kidney stones, no kidney disease  Vascular:  no pain suggestive of claudication, no pain in feet, + leg cramps, no varicose veins, no DVT, no non-healing foot ulcer  Neuro:   no stroke, no TIA's, no seizures, no headaches, no temporary blindness one eye,  no slurred speech, no peripheral neuropathy, no chronic pain, + instability of gait, no memory/cognitive dysfunction  Musculoskeletal: + arthritis, no joint swelling, no myalgias, some difficulty walking, limited mobility   Skin:   no rash, no itching, no skin infections, no pressure sores or ulcerations  Psych:   no anxiety, no depression, no nervousness, no unusual recent stress  Eyes:   no blurry vision, no floaters, no recent vision changes, no wears glasses or contacts  ENT:   + severe hearing loss, no loose or painful teeth, no dentures, last saw dentist 2019  Hematologic:  + easy bruising, no abnormal bleeding, no clotting disorder, no frequent epistaxis  Endocrine:  no diabetes, does not check CBG's at home           Physical Exam:   BP 115/66 (BP Location: Right Arm, Patient Position: Sitting, Cuff Size: Normal)    Pulse 74    Temp 97.9 F (36.6 C)    Resp 16    Ht 5\' 8"  (1.727 m)    Wt 122 lb (55.3 kg)    SpO2 91% Comment: RA   BMI 18.55 kg/m   General:  Elderly and frail-appearing  HEENT:  Unremarkable   Neck:   no JVD, no bruits, no adenopathy   Chest:   clear to auscultation, symmetrical breath sounds, no wheezes, no rhonchi   CV:   RRR,  grade III/VI crescendo/decrescendo murmur heard best at RSB,  no diastolic murmur  Abdomen:  soft, non-tender, no masses   Extremities:  warm, well-perfused, pulses palpable, no LE edema  Rectal/GU  Deferred  Neuro:   Grossly non-focal and symmetrical throughout  Skin:   Clean and dry, no rashes, no breakdown   Diagnostic Tests:    ECHOCARDIOGRAM REPORT       Patient Name:   Raymond Swanson Date of Exam: 01/17/2019 Medical Rec #:  [id number]      Height:       68.0 in Accession #:    [id number]     Weight:       124.2 lb Date of Birth:  09/11/1924      BSA:          1.67 m Patient Age:    89 years       BP:           142/78 mmHg Patient Gender: M              HR:           101 bpm. Exam Location:  [address]    Procedure: 2D Echo, Cardiac Doppler and Color Doppler  Indications:    R55 Syncope   History:        Patient has prior history of Echocardiogram examinations, most  recent 11/16/2017. Prior CABG Risk Factors: Hypertension and                 Dyslipidemia. Hypothyroidism. Coronary artery disease.   Sonographer:    Wilford Sports Rodgers-Jones RDCS Referring Phys: [place]    1. The left ventricle has normal systolic function, with an ejection fraction of 55-60%. The cavity size was normal. There is mildly increased left ventricular wall thickness. Left ventricular diastolic Doppler parameters are consistent with impaired  relaxation. Indeterminate filling pressures.  2. The right ventricle has normal systolic function. The cavity was normal. There is no increase in right ventricular wall thickness. Right ventricular systolic pressure is moderately elevated with an estimated pressure of 64.5 mmHg.  3. Left atrial size was moderately dilated.  4. The mitral valve is degenerative. Mild thickening of the mitral valve leaflet. There is moderate mitral annular calcification present.  5. The aortic valve is abnormal. Severely  thickening of the aortic valve. Severe calcifcation of the aortic valve. Aortic valve regurgitation is moderate by color flow Doppler. Severe stenosis of the aortic valve.  6. The interatrial septum appears to be lipomatous.  FINDINGS  Left Ventricle: The left ventricle has normal systolic function, with an ejection fraction of 55-60%. The cavity size was normal. There is mildly increased left ventricular wall thickness. Left ventricular diastolic Doppler parameters are consistent  with impaired relaxation. Indeterminate filling pressures  Right Ventricle: The right ventricle has normal systolic function. The cavity was normal. There is no increase in right ventricular wall thickness. Right ventricular systolic pressure is moderately elevated with an estimated pressure of 64.5 mmHg.  Left Atrium: Left atrial size was moderately dilated.  Right Atrium: Right atrial size was normal in size. Right atrial pressure is estimated at 10 mmHg.  Interatrial Septum: No atrial level shunt detected by color flow Doppler. Increased thickness of the atrial septum sparing the fossa ovalis consistent with The interatrial septum appears to be lipomatous.  Pericardium: There is no evidence of pericardial effusion.  Mitral Valve: The mitral valve is degenerative in appearance. Mild thickening of the mitral valve leaflet. There is moderate mitral annular calcification present. Mitral valve regurgitation is mild by color flow Doppler.  Tricuspid Valve: The tricuspid valve is normal in structure. Tricuspid valve regurgitation is trivial by color flow Doppler.  Aortic Valve: The aortic valve is abnormal Severely thickening of the aortic valve. Severe calcifcation of the aortic valve. Aortic valve regurgitation is moderate by color flow Doppler. There is Severe stenosis of the aortic valve, with a calculated  valve area of 1.14 cm.  Pulmonic Valve: The pulmonic valve was normal in structure. Pulmonic valve  regurgitation is not visualized by color flow Doppler.    +--------------+--------++  LEFT VENTRICLE            +----------------+---------++ +--------------+--------++  Diastology                    PLAX 2D                   +----------------+---------++ +--------------+--------++  LV e' lateral:   8.38 cm/s    LVIDd:         3.60 cm    +----------------+---------++ +--------------+--------++  LV E/e' lateral: 11.1         LVIDs:         2.60 cm    +----------------+---------++ +--------------+--------++  LV e' medial:    6.74 cm/s    LV PW:  0.90 cm    +----------------+---------++ +--------------+--------++  LV E/e' medial:  13.8         LV IVS:        0.90 cm    +----------------+---------++ +--------------+--------++  LVOT diam:     2.20 cm    +--------------+--------++  LV SV:         30 ml      +--------------+--------++  LV SV Index:   18.25      +--------------+--------++  LVOT Area:     3.80 cm   +--------------+--------++                            +--------------+--------++  +---------------+----------++  RIGHT VENTRICLE              +---------------+----------++  RV Basal diam:  3.50 cm      +---------------+----------++  RV S prime:     12.50 cm/s   +---------------+----------++  TAPSE (M-mode): 1.6 cm       +---------------+----------++  RVSP:           57.5 mmHg    +---------------+----------++  +---------------+-------++-----------++  LEFT ATRIUM              Index         +---------------+-------++-----------++  LA diam:        4.70 cm  2.82 cm/m    +---------------+-------++-----------++  LA Vol (A2C):   57.8 ml  34.63 ml/m   +---------------+-------++-----------++  LA Vol (A4C):   59.7 ml  35.77 ml/m   +---------------+-------++-----------++  LA Biplane Vol: 60.9 ml  36.49 ml/m   +---------------+-------++-----------++ +------------+---------++-----------++  RIGHT ATRIUM            Index          +------------+---------++-----------++  RA Pressure: 3.00 mmHg                +------------+---------++-----------++  RA Area:     13.00 cm                +------------+---------++-----------++  RA Volume:   34.70 ml   20.79 ml/m   +------------+---------++-----------++  +------------------+------------++  AORTIC VALVE                      +------------------+------------++  AV Area (Vmax):    0.90 cm       +------------------+------------++  AV Area (Vmean):   0.93 cm       +------------------+------------++  AV Area (VTI):     1.14 cm       +------------------+------------++  AV Vmax:           394.00 cm/s    +------------------+------------++  AV Vmean:          280.200 cm/s   +------------------+------------++  AV VTI:            0.770 m        +------------------+------------++  AV Peak Grad:      62.1 mmHg      +------------------+------------++  AV Mean Grad:      39.0 mmHg      +------------------+------------++  LVOT Vmax:         93.43 cm/s     +------------------+------------++  LVOT Vmean:        68.333 cm/s    +------------------+------------++  LVOT VTI:          0.230 m        +------------------+------------++  LVOT/AV VTI ratio: 0.30           +------------------+------------++  AR PHT:            222 msec       +------------------+------------++   +-------------+-------++  AORTA                   +-------------+-------++  Ao Root diam: 3.20 cm   +-------------+-------++  Ao Asc diam:  3.20 cm   +-------------+-------++  +--------------+----------++  +---------------+-----------++  MITRAL VALVE                  TRICUSPID VALVE               +--------------+----------++  +---------------+-----------++  MV Area (PHT): 3.77 cm       TR Peak grad:   54.5 mmHg     +--------------+----------++  +---------------+-----------++  MV PHT:        58.29 msec     TR Vmax:        369.00 cm/s   +--------------+----------++   +---------------+-----------++  MV Decel Time: 201 msec       Estimated RAP:  3.00 mmHg     +--------------+----------++  +---------------+-----------++ +--------------+-----------++  RVSP:           57.5 mmHg      MV E velocity: 93.00 cm/s    +---------------+-----------++ +--------------+-----------++  MV A velocity: 110.00 cm/s   +--------------+-------+ +--------------+-----------++  SHUNTS                   MV E/A ratio:  0.85          +--------------+-------+ +--------------+-----------++  Systemic VTI:  0.23 m                                 +--------------+-------+                                Systemic Diam: 2.20 cm                                +--------------+-------+    Skeet Latch MD Electronically signed by Skeet Latch MD Signature Date/Time: 01/17/2019/6:36:07 PM     RIGHT/LEFT HEART CATH AND CORONARY/GRAFT ANGIOGRAPHY  Conclusion  1. Severe multivessel CAD with severe left main stenosis, and total occlusion of the LCx, LAD, and RCA 2. S/P remote multivessel CABG with continued patency of the LIMA-LAD, SVG-OM, and SVG-RCA 3. Known severe AS with visible calcification and restriction of the aortic valve leaflets by plain fluoroscopy 4. Moderate pulmonary hypertension, normal PCWP  Plan: continued TAVR evaluation  Indications  Severe aortic stenosis [I35.0 (ICD-10-CM)]  Procedural Details  Technical Details INDICATION: 85 yo gentleman with remote CABG who has developed severe symptomatic aortic stenosis with NYHA functional class III dyspnea  PROCEDURAL DETAILS: There was an indwelling IV in a right antecubital vein. Using normal sterile technique, the IV was changed out for a 5 Fr brachial sheath over a 0.018 inch wire. The left wrist was then prepped, draped, and anesthetized with 1% lidocaine. Using the modified Seldinger technique a 5/6 French Slender sheath was placed in the left radial artery. Intra-arterial verapamil was administered through the  radial artery sheath. IV heparin was administered after a JR4 catheter was advanced into the central aorta. A Swan-Ganz catheter was used for the right heart catheterization. Standard protocol was followed for recording of right  heart pressures and sampling of oxygen saturations. Fick cardiac output was calculated. Standard Judkins catheters were used for selective coronary angiography and bypass graft angiography. There were no immediate procedural complications. The patient was transferred to the post catheterization recovery area for further monitoring.    Estimated blood loss <50 mL.   During this procedure medications were administered to achieve and maintain moderate conscious sedation while the patient's heart rate, blood pressure, and oxygen saturation were continuously monitored and I was present face-to-face 100% of this time.  Medications (Filter: Administrations occurring from 04/28/19 0718 to 04/28/19 0851) Medication Rate/Dose/Volume Action  Date Time   midazolam (VERSED) injection (mg) 1 mg Given 04/28/19 0739   Total dose as of 04/28/19 0851        1 mg        lidocaine (PF) (XYLOCAINE) 1 % injection (mL) 2 mL Given 04/28/19 0747   Total dose as of 04/28/19 0851        2 mL        Radial Cocktail/Verapamil only (mL) 10 mL Given 04/28/19 0810   Total dose as of 04/28/19 0851        10 mL        heparin injection (Units) 3,000 Units Given 04/28/19 0823   Total dose as of 04/28/19 0851        3,000 Units        Heparin (Porcine) in NaCl 1000-0.9 UT/500ML-% SOLN (mL) 500 mL Given 04/28/19 0840   Total dose as of 04/28/19 0851 500 mL Given 0840   1,500 mL 500 mL Given 0840   lidocaine (PF) (XYLOCAINE) 1 % injection (mL) 2 mL Given 04/28/19 0800   Total dose as of 04/28/19 0851        2 mL        Sedation Time  Sedation Time Physician-1: 56 minutes 13 seconds  Radiation/Fluoro  Fluoro time: 8.8 (min) DAP: 6474 (mGycm2) Cumulative Air Kerma: 109 (mGy)  Coronary  Findings  Diagnostic Dominance: Right Left Main  Ost LM to Mid LM lesion 90% stenosed  Ost LM to Mid LM lesion is 90% stenosed.  Left Anterior Descending  Prox LAD lesion 100% stenosed  Prox LAD lesion is 100% stenosed.  Left Circumflex  Ost Cx to Prox Cx lesion 100% stenosed  Ost Cx to Prox Cx lesion is 100% stenosed.  First Obtuse Marginal Branch  1st Mrg-1 lesion 95% stenosed  1st Mrg-1 lesion is 95% stenosed.  1st Mrg-2 lesion 40% stenosed  1st Mrg-2 lesion is 40% stenosed.  Right Coronary Artery  Ost RCA to Prox RCA lesion 100% stenosed  Ost RCA to Prox RCA lesion is 100% stenosed. The lesion is severely calcified.  Graft to 1st Mrg  LIMA Graft to Mid LAD  Graft to Dist RCA  Intervention  No interventions have been documented. Left Heart  Aortic Valve The aortic valve is calcified. There is restricted aortic valve motion.  Coronary Diagrams  Diagnostic Dominance: Right  Intervention  Implants   No implant documentation for this case.  Syngo Images  Show images for CARDIAC CATHETERIZATION  Images on Long Term Storage  Show images for Ashan, [place] to Procedure Log  Procedure Log    Hemo Data   Most Recent Value  Fick Cardiac Output 3.98 L/min  Fick Cardiac Output Index 2.43 (L/min)/BSA  RA A Wave 5 mmHg  RA V Wave 5 mmHg  RA Mean 3 mmHg  RV Systolic Pressure 56  mmHg  RV Diastolic Pressure 0 mmHg  RV EDP 5 mmHg  PA Systolic Pressure 56 mmHg  PA Diastolic Pressure 23 mmHg  PA Mean 35 mmHg  PW A Wave 17 mmHg  PW V Wave 16 mmHg  PW Mean 14 mmHg  AO Systolic Pressure 0000000 mmHg  AO Diastolic Pressure 55 mmHg  AO Mean 87 mmHg  QP/QS 1  TPVR Index 14.41 HRUI  TSVR Index 32.95 HRUI  PVR SVR Ratio 0.27  TPVR/TSVR Ratio 0.44      Cardiac TAVR CT  TECHNIQUE: The patient was scanned on a [employer]. A 120 kV retrospective scan was triggered in the descending thoracic aorta at 111 HU's. Gantry rotation speed was 250  msecs and collimation was .6 mm. No beta blockade or nitro were given. The 3D data set was reconstructed in 5% intervals of the R-R cycle. Systolic and diastolic phases were analyzed on a dedicated work station using MPR, MIP and VRT modes. The patient received 80 cc of contrast.  FINDINGS: Aortic Valve: Trileaflet aortic valve with severely thickened and calcified leaflets with severely restricted leaflet opening and minimal calcifications extending into the LVOT.  Aorta: Normal size, moderate to severe diffuse atherosclerotic plaque and calcifications in the sinotubular junction, aortic arch and descending aorta.  Sinotubular Junction: 33 x 33 mm  Ascending Thoracic Aorta: 37 x 34 mm  Aortic Arch: 30 x 28 mm  Descending Thoracic Aorta: 27 x 26 mm  Sinus of Valsalva Measurements:  Non-coronary: 40 mm  Right -coronary: 38 mm  Left -coronary: 39 mm  Coronary Artery Height above Annulus:  Left Main: 17 mm  Right Coronary (occluded): 23 mm  Virtual Basal Annulus Measurements:  Maximum/Minimum Diameter: 31.6 x 26.7 mm  Mean Diameter: 28.2 mm  Perimeter: 90.3 mm  Area: 623 mm2  Optimum Fluoroscopic Angle for Delivery:  LAO 6 CAU 9  IMPRESSION: 1. Trileaflet aortic valve with severely thickened and calcified leaflets with severely restricted leaflet opening and minimal calcifications extending into the LVOT. Aortic valve calcium score 4497 consistent with severe aortic stenosis. Annular measurements suitable for delivery of a 29 mm Edwards-SAPIEN 3 valve.  2. Sufficient coronary to annulus distance.  3. Optimum Fluoroscopic Angle for Delivery: LAO 6 CAU 9.  4. No thrombus in the left atrial appendage.  Electronically Signed: By: Ena Dawley On: 05/11/2019 23:45    CT ANGIOGRAPHY CHEST, ABDOMEN AND PELVIS  TECHNIQUE: Multidetector CT imaging through the chest, abdomen and pelvis was performed using the standard protocol  during bolus administration of intravenous contrast. Multiplanar reconstructed images and MIPs were obtained and reviewed to evaluate the vascular anatomy.  CONTRAST:  118mL OMNIPAQUE IOHEXOL 350 MG/ML SOLN  COMPARISON:  CT the chest, abdomen and pelvis 01/22/2016.  FINDINGS: CTA CHEST FINDINGS  Cardiovascular: Heart size is normal. There is no significant pericardial fluid, thickening or pericardial calcification. There is aortic atherosclerosis, as well as atherosclerosis of the great vessels of the mediastinum and the coronary arteries, including calcified atherosclerotic plaque in the left main, left anterior descending, left circumflex and right coronary arteries. Status post median sternotomy for CABG including LIMA to the LAD. Severe thickening calcification of the aortic valve.  Mediastinum/Lymph Nodes: No pathologically enlarged mediastinal or hilar lymph nodes. Esophagus is unremarkable in appearance. No axillary lymphadenopathy.  Lungs/Pleura: No suspicious appearing pulmonary nodules or masses are noted. No acute consolidative airspace disease. No pleural effusions. Diffuse bronchial wall thickening with moderate centrilobular and paraseptal emphysema. Nodular areas of pleuroparenchymal scarring  in the lung apices bilaterally (left greater than right), likely to reflect areas of mild post infectious or inflammatory scarring.  Musculoskeletal/Soft Tissues: Median sternotomy wires. Multiple compression fractures are noted at T8, T11 and T12, most severe at T11 where there is 70% loss of anterior vertebral body height. Most of these appear chronic, however, at T11 there are clearly visualized fracture lines, without significant paravertebral soft tissue swelling (suggestive of a remote subacute injury). There are no aggressive appearing lytic or blastic lesions noted in the visualized portions of the skeleton.  CTA ABDOMEN AND PELVIS  FINDINGS  Hepatobiliary: No suspicious cystic or solid hepatic lesions. No intra or extrahepatic biliary ductal dilatation. Partially calcified gallstones lying dependently in the gallbladder. Gallbladder is nearly completely contracted without surrounding inflammatory changes to suggest an acute cholecystitis at this time.  Pancreas: No pancreatic mass. No pancreatic ductal dilatation. No pancreatic or peripancreatic fluid collections or inflammatory changes.  Spleen: Unremarkable.  Adrenals/Urinary Tract: In the posterior aspect of the lower pole of the right kidney there is an exophytic 1.3 cm enhancing lesion, highly concerning for small renal cell carcinoma, new compared to the prior study from 09/23/2017. Subcentimeter low-attenuation lesions in both kidneys, too small to characterize, but statistically likely to represent tiny cysts. No hydroureteronephrosis. In the left side of the urinary bladder there is a 1.1 x 1.0 cm enhancing lesion (axial image 198 of series 15), concerning for potential urothelial neoplasm. There is also a small amount of thickening and enhancement of the posterolateral wall of the urinary bladder on the right side (axial image 202 of series 15). Mild bilateral adrenal form adrenal thickening, nonspecific.  Stomach/Bowel: Normal appearance of the stomach. No pathologic dilatation of small bowel or colon. Normal appendix.  Vascular/Lymphatic: Aortic atherosclerosis, with fusiform aneurysmal dilatation of the infrarenal abdominal aorta measuring up to 2.8 x 3.2 cm immediately above the level of the bifurcation (axial image 152 of series 15). Vascular findings and measurements pertinent to potential T AVR procedure, as detailed below. No lymphadenopathy noted in the abdomen or pelvis.  Reproductive: Status post hysterectomy. Ovaries are not confidently identified may be surgically absent or atrophic.  Other: No significant volume of  ascites.  No pneumoperitoneum.  Musculoskeletal: Chronic appearing compression fracture of L1 with 60% loss of anterior vertebral body height is unchanged. Mildly expansile mixed lucent and sclerotic appearance of the left ilium, similar to prior studies, likely reflective of Paget's disease. There are no aggressive appearing lytic or blastic lesions noted in the visualized portions of the skeleton.  VASCULAR MEASUREMENTS PERTINENT TO TAVR:  AORTA:  Minimal Aortic Diameter-15 x 10 mm  Severity of Aortic Calcification-severe  RIGHT PELVIS:  Right Common Iliac Artery -  Minimal Diameter-6.7 x 3.8 mm  Tortuosity-mild  Calcification-severe  Right External Iliac Artery -  Minimal Diameter-7.8 x 7.1 mm  Tortuosity-moderate  Calcification - moderate  Right Common Femoral Artery -  Minimal Diameter-6.2 x 6.2 mm  Tortuosity-mild  Calcification - moderate  LEFT PELVIS:  Left Common Iliac Artery -  Minimal Diameter-7.6 x 5.3 mm  Tortuosity-mild  Calcification-severe  Left External Iliac Artery -  Minimal Diameter-6.0 x 5.6 mm  Tortuosity-moderate  Calcification - moderate  Left Common Femoral Artery -  Minimal Diameter-8.0 x 7.0 mm  Tortuosity-mild  Calcification-moderate  Review of the MIP images confirms the above findings.  IMPRESSION: 1. Vascular findings and measurements pertinent to potential TAVR procedure, as above. 2. Severe thickening calcification of the aortic valve, compatible with the reported clinical  history of severe aortic stenosis. 3. Aortic atherosclerosis, in addition to left main and 3 vessel coronary artery disease. Status post median sternotomy for CABG including LIMA to the LAD. 4. Small enhancing areas in the urinary bladder posteriorly concerning for recurrent urothelial neoplasm. 5. In addition, today's study demonstrates an enhancing exophytic lesion measuring 1.3 cm in the posterior  aspect of the lower pole of the right kidney, concerning for small renal cell carcinoma. Outpatient referral to Urology for further evaluation is recommended. 6. In addition to numerous chronic compression fractures, there is a new compression fracture of T11 which has imaging characteristics suggestive of a remote subacute fracture, with 70% loss of anterior vertebral body height at this time. 7. Additional incidental findings, as above.   Electronically Signed   By: Vinnie Langton M.D.   On: 05/11/2019 10:54    EKG: NSR w/out acute ischemic changes or AV conduction delay    Impression:  Patient has stage D severe symptomatic aortic stenosis.  He presents with progressive symptoms of exertional shortness of breath and fatigue consistent with chronic diastolic congestive heart failure, [place] Heart Association functional class III.  The patient has also suffered to frank syncopal episodes.  I have personally reviewed the patient's most recent transthoracic echocardiogram, diagnostic cardiac catheterization, and CT angiograms.  Echocardiogram reveals severe aortic stenosis.  The aortic valve is trileaflet.  There is severe thickening, calcification, and restricted leaflet mobility involving all 3 leaflets of the aortic valve.  Peak velocity across the aortic valve was reported 3.9 m/s corresponding to mean transvalvular gradient estimated 39 mmHg and aortic valve area calculated 0.90 cm.  Diagnostic cardiac catheterization is notable for severe native three-vessel coronary artery disease but continued patency of all 3 bypass grafts placed at the time of the patient's previous bypass surgery many years ago.  There is mild pulmonary hypertension.  Cardiac-gated CTA of the heart reveals anatomical characteristics consistent with aortic stenosis suitable for treatment by transcatheter aortic valve replacement without any significant complicating features and CTA of the aorta and iliac  vessels demonstrate what appears to be adequate pelvic vascular access to facilitate a transfemoral approach.  EKG reveals sinus rhythm with no significant AV conduction delay.  I agree the patient would likely benefit from aortic valve replacement with respect to symptomatic improvement and potential for prolongation of his life.  However, I would not consider this somewhat frail elderly gentleman a candidate for conventional surgical aortic valve replacement via redo sternotomy under any circumstances.  Alternatives include transcatheter aortic valve replacement versus long-term palliative medical therapy.    Plan:  The patient was counseled at length regarding treatment alternatives for management of severe symptomatic aortic stenosis. Alternative approaches such as conventional aortic valve replacement, transcatheter aortic valve replacement, and continued medical therapy without intervention were compared and contrasted at length.  The risks associated with conventional surgical aortic valve replacement were discussed in detail, as were expectations for post-operative convalescence, and why I would be reluctant to consider this patient a candidate for conventional surgery.  Issues specific to transcatheter aortic valve replacement were discussed including questions about long term valve durability, the potential for paravalvular leak, possible increased risk of need for permanent pacemaker placement, and other technical complications related to the procedure itself.  Long-term prognosis with medical therapy was discussed. This discussion was placed in the context of the patient's own specific clinical presentation and past medical history.  Expectations for the patient's postoperative convalescence following uncomplicated transcatheter aortic valve replacement  have been discussed, including the possibility that the patient might need short-term placement in a skilled nursing facility if he has any  setbacks at all.  All of his questions have been addressed.  The patient desires to proceed with transcatheter aortic valve replacement as soon as possible.  Following the decision to proceed with transcatheter aortic valve replacement, a discussion has been held regarding what types of management strategies would be attempted intraoperatively in the event of life-threatening complications, including whether or not the patient would be considered a candidate for the use of cardiopulmonary bypass and/or conversion to open sternotomy for attempted surgical intervention.  The patient has been advised of a variety of complications that might develop including but not limited to risks of death, stroke, paravalvular leak, aortic dissection or other major vascular complications, aortic annulus rupture, device embolization, cardiac rupture or perforation, mitral regurgitation, acute myocardial infarction, arrhythmia, heart block or bradycardia requiring permanent pacemaker placement, congestive heart failure, respiratory failure, renal failure, pneumonia, infection, other late complications related to structural valve deterioration or migration, or other complications that might ultimately cause a temporary or permanent loss of functional independence or other long term morbidity.  The patient provides full informed consent for the procedure as described and all questions were answered.    I spent in excess of 90 minutes during the conduct of this office consultation and >50% of this time involved direct face-to-face encounter with the patient for counseling and/or coordination of their care.    Valentina Gu. Roxy Manns, MD 05/16/2019 3:24 PM

## 2019-05-16 NOTE — H&P (View-Only) (Signed)
HEART AND [place] SURGERY CONSULTATION REPORT  Primary Cardiologist is Minus Breeding, MD PCP is Isaac Bliss, Rayford Halsted, MD  Chief Complaint  Patient presents with   Aortic Stenosis    TAVR EVAL and review all required competed studies/procedures...OP PT APPOINTMENT is after Dr. Guy Sandifer visit    HPI:  Patient is a 85 year old male with history of aortic stenosis, coronary artery disease status post coronary artery bypass grafting in the remote past, hypertension, hyperlipidemia, hypothyroidism, and prostate cancer who has been referred for surgical consultation to discuss treatment options for management of severe symptomatic aortic stenosis.  Patient's cardiac history dates back to 1991 when he was found to have three-vessel coronary artery disease after having undergone routine exercise treadmill stress test.  He underwent coronary artery bypass grafting x3 by Dr. Redmond Pulling.  He has done well from a cardiac standpoint ever since and for several years has been followed carefully by Dr. Percival Spanish.  Echocardiograms have documented the presence of preserved left ventricular function with aortic stenosis that has gradually progressed in severity.  The patient was recently evaluated by Dr. Percival Spanish and reported to syncopal episodes.  He has a long history of exertional shortness of breath that has progressed substantially over the past year.  His appetite is been down and he has lost a fair amount of weight.  He describes to frank syncopal episodes without any associated chest pain.  Follow-up transthoracic echocardiogram performed Jan 17, 2019 revealed normal left ventricular systolic function with severe aortic stenosis and moderate aortic insufficiency.  He was referred to the multidisciplinary heart valve clinic and has been evaluated previously by Dr. Burt Knack.  Diagnostic cardiac catheterization was performed April 28, 2019 and  revealed severe native coronary artery disease with continued patency of bypass grafts in all 3 major vascular territories.  The aortic valve was not crossed.  There was moderate pulmonary hypertension.  CT angiography was performed and the patient referred for surgical consultation.  Patient is married and lives with his wife locally in [place].  His wife is reportedly frail and suffers from mild dementia.  Despite this the patient and his wife remain functionally independent.  Up until the patient's recent syncopal episode he had been driving an automobile, but he stopped driving at the insistence of Dr. Percival Spanish.  He admits to progressive exertional shortness of breath and fatigue.  He gets short of breath with very low level activity but he denies resting shortness of breath.  He has not had any chest pain or chest tightness either with activity or at rest.  He denies PND, orthopnea, or lower extremity edema.  He has developed poor appetite and lost weight.  He admits that his strength is very poor and when he passed out he could not get back up off of the ground because of generalized weakness.  He offers suffered a mechanical fall approximately 1 year ago, and again at that time he could not get back up after he had fallen despite no significant physical injuries.  He has begun ambulating using a cane or a walker, and he now uses a walker whenever he leaves the house.  The patient is very hard of hearing and has some difficulty comprehending if he is not allowed to read lips.  He is accompanied for his office consultation visit by a friend who the patient employs for assistance whenever he needs to get out of the house for appointments.  He has a single  adult son who lives in [place].  Past Medical History:  Diagnosis Date   Acute gastric ulcer with hemorrhage, without mention of obstruction 09/10/2009   ANEMIA 10/08/2009   CORONARY ARTERY DISEASE 01/26/2007   s/p remote CABG x3V   History of  kidney stones    HOH (hard of hearing)    Hx of bladder cancer    s/p TURBT by Dr. Lovena Neighbours   HYPERLIPIDEMIA 01/26/2007   HYPERTENSION 01/26/2007   HYPOTHYROIDISM 10/18/2007   PROSTATE CANCER, HX OF 01/26/2007   SKIN CANCER, HX OF 01/26/2007   Subdural hematoma ([place]) 2016   fall from deck while cleaning gutters; sustained subdemral hematoma ; doing ok now     Past Surgical History:  Procedure Laterality Date   CATARACT EXTRACTION     CORONARY ARTERY BYPASS GRAFT     1991; reports at PAT appt 10-11-17: i went to my priamry doctor for a physical and had aroutine stress test that was bad, was sent for cath ( more than 1 but nsure how many or if stents were placed), he rprots " that didnt work so they did the surgery". denies heart symptoms for over 20 years;  sees  cardiology [facility] annually for EKGs    CYSTOSCOPY W/ URETERAL STENT PLACEMENT Right 10/13/2017   Procedure: CYSTOSCOPY WITH STENT REPLACEMENT;  Surgeon: Ceasar Mons, MD;  Location: [facility];  Service: Urology;  Laterality: Right;   EXCISION MASS UPPER EXTREMETIES Left 08/11/2016   Procedure: EXCISION MASS LEFT SHOULDER, LEFT FOREARM, LEFT WRIST;  Surgeon: Judeth Horn, MD;  Location: [place];  Service: General;  Laterality: Left;   HERNIA REPAIR     ingunial   MOHS SURGERY     PROSTATE SURGERY     prostatectomy   RIGHT/LEFT HEART CATH AND CORONARY/GRAFT ANGIOGRAPHY N/A 04/28/2019   Procedure: RIGHT/LEFT HEART CATH AND CORONARY/GRAFT ANGIOGRAPHY;  Surgeon: Sherren Mocha, MD;  Location: [place] [facility];  Service: Cardiovascular;  Laterality: N/A;   TEE WITHOUT CARDIOVERSION N/A 11/17/2017   Procedure: TRANSESOPHAGEAL ECHOCARDIOGRAM (TEE);  Surgeon: Dixie Dials, MD;  Location: [facility];  Service: Cardiovascular;  Laterality: N/A;   TRANSURETHRAL RESECTION OF BLADDER TUMOR N/A 10/13/2017   Procedure: TRANSURETHRAL RESECTION OF BLADDER TUMOR/ BIPOLAR (TURBT);  Surgeon: Ceasar Mons, MD;   Location: [facility];  Service: Urology;  Laterality: N/A;  ONLY NEEDS 90 MIN FOR BOTH PROCEDURES    Family History  Problem Relation Age of Onset   Lung cancer Brother 49   GI problems Sister     Social History   Socioeconomic History   Marital status: Married    Spouse name: Not on file   Number of children: Not on file   Years of education: Not on file   Highest education level: Not on file  Occupational History   Not on file  Social Needs   Financial resource strain: Not on file   Food insecurity    Worry: Not on file    Inability: Not on file   Transportation needs    Medical: Not on file    Non-medical: Not on file  Tobacco Use   Smoking status: Former Smoker    Packs/day: 1.00    Years: 20.00    Pack years: 20.00    Types: Cigarettes    Quit date: 10/28/1948    Years since quitting: 70.5   Smokeless tobacco: Never Used  Substance and Sexual Activity   Alcohol use: Yes    Comment: 4 oz. wine  daily   Drug use: No   Sexual activity: Not on file  Lifestyle   Physical activity    Days per week: Not on file    Minutes per session: Not on file   Stress: Not on file  Relationships   Social connections    Talks on phone: Not on file    Gets together: Not on file    Attends religious service: Not on file    Active member of club or organization: Not on file    Attends meetings of clubs or organizations: Not on file    Relationship status: Not on file   Intimate partner violence    Fear of current or ex partner: Not on file    Emotionally abused: Not on file    Physically abused: Not on file    Forced sexual activity: Not on file  Other Topics Concern   Not on file  Social History Narrative   Not on file    Current Outpatient Medications  Medication Sig Dispense Refill   acetaminophen (TYLENOL) 500 MG tablet Take 500-1,000 mg by mouth every 6 (six) hours as needed for moderate pain or headache.     aspirin EC 81 MG tablet Take 81 mg by  mouth at bedtime.      CALCIUM PO Take 1 capsule by mouth daily.     carboxymethylcellulose (REFRESH TEARS) 0.5 % SOLN Place 1 drop into both eyes 3 (three) times daily as needed (dry eyes).     carvedilol (COREG) 3.125 MG tablet Take 1 tablet (3.125 mg total) by mouth as directed. Take 2 tablets in the morning and 1 tablet in the evening (Patient taking differently: Take 3.125 mg by mouth as directed. Take 6.25 mg in the morning and 3.125 in the evening) 30 tablet 11   Cyanocobalamin (B-12 PO) Take 1 capsule by mouth daily.     furosemide (LASIX) 20 MG tablet Take 1 tablet (20 mg total) by mouth daily. (Patient taking differently: Take 20 mg by mouth daily with lunch. ) 90 tablet 1   levothyroxine (SYNTHROID) 75 MCG tablet Take 1 tablet (75 mcg total) by mouth daily. (Patient taking differently: Take 75 mcg by mouth daily before breakfast. ) 90 tablet 1   MELATONIN PO Take 1 tablet by mouth at bedtime.      Multiple Vitamin (MULTIVITAMIN WITH MINERALS) TABS tablet Take 1 tablet by mouth daily.     nitroGLYCERIN (NITROSTAT) 0.4 MG SL tablet Place 1 tablet (0.4 mg total) under the tongue every 5 (five) minutes as needed. (Patient taking differently: Place 0.4 mg under the tongue every 5 (five) minutes as needed for chest pain. ) 20 tablet 5   pramipexole (MIRAPEX) 0.25 MG tablet TAKE 1 TABLET(0.25 MG) BY MOUTH AT BEDTIME 90 tablet 1   VITAMIN D PO Take 1 capsule by mouth daily.     Multiple Vitamins-Minerals (PRESERVISION AREDS 2 PO) Take 1 tablet by mouth 2 (two) times daily.      No current facility-administered medications for this visit.     No Known Allergies    Review of Systems:   General:  poor appetite, decreased energy, no weight gain, + weight loss, no fever  Cardiac:  no chest pain with exertion, no chest pain at rest, +SOB with exertion, no resting SOB, no PND, no orthopnea, no palpitations, no arrhythmia, no atrial fibrillation, no LE edema, + dizzy spells, +  syncope  Respiratory:  + shortness of breath, no home oxygen, no  productive cough, no dry cough, no bronchitis, no wheezing, no hemoptysis, no asthma, no pain with inspiration or cough, no sleep apnea, no CPAP at night  GI:   no difficulty swallowing, no reflux, no frequent heartburn, no hiatal hernia, no abdominal pain, no constipation, no diarrhea, no hematochezia, no hematemesis, no melena  GU:   no dysuria,  + frequency, no urinary tract infection, no hematuria, no enlarged prostate, no kidney stones, no kidney disease  Vascular:  no pain suggestive of claudication, no pain in feet, + leg cramps, no varicose veins, no DVT, no non-healing foot ulcer  Neuro:   no stroke, no TIA's, no seizures, no headaches, no temporary blindness one eye,  no slurred speech, no peripheral neuropathy, no chronic pain, + instability of gait, no memory/cognitive dysfunction  Musculoskeletal: + arthritis, no joint swelling, no myalgias, some difficulty walking, limited mobility   Skin:   no rash, no itching, no skin infections, no pressure sores or ulcerations  Psych:   no anxiety, no depression, no nervousness, no unusual recent stress  Eyes:   no blurry vision, no floaters, no recent vision changes, no wears glasses or contacts  ENT:   + severe hearing loss, no loose or painful teeth, no dentures, last saw dentist 2019  Hematologic:  + easy bruising, no abnormal bleeding, no clotting disorder, no frequent epistaxis  Endocrine:  no diabetes, does not check CBG's at home           Physical Exam:   BP 115/66 (BP Location: Right Arm, Patient Position: Sitting, Cuff Size: Normal)    Pulse 74    Temp 97.9 F (36.6 C)    Resp 16    Ht 5\' 8"  (1.727 m)    Wt 122 lb (55.3 kg)    SpO2 91% Comment: RA   BMI 18.55 kg/m   General:  Elderly and frail-appearing  HEENT:  Unremarkable   Neck:   no JVD, no bruits, no adenopathy   Chest:   clear to auscultation, symmetrical breath sounds, no wheezes, no rhonchi   CV:   RRR,  grade III/VI crescendo/decrescendo murmur heard best at RSB,  no diastolic murmur  Abdomen:  soft, non-tender, no masses   Extremities:  warm, well-perfused, pulses palpable, no LE edema  Rectal/GU  Deferred  Neuro:   Grossly non-focal and symmetrical throughout  Skin:   Clean and dry, no rashes, no breakdown   Diagnostic Tests:    ECHOCARDIOGRAM REPORT       Patient Name:   Raymond Swanson Date of Exam: 01/17/2019 Medical Rec #:  [id number]      Height:       68.0 in Accession #:    [id number]     Weight:       124.2 lb Date of Birth:  11-May-1925      BSA:          1.67 m Patient Age:    74 years       BP:           142/78 mmHg Patient Gender: M              HR:           101 bpm. Exam Location:  [address]    Procedure: 2D Echo, Cardiac Doppler and Color Doppler  Indications:    R55 Syncope   History:        Patient has prior history of Echocardiogram examinations, most  recent 11/16/2017. Prior CABG Risk Factors: Hypertension and                 Dyslipidemia. Hypothyroidism. Coronary artery disease.   Sonographer:    Wilford Sports Rodgers-Jones RDCS Referring Phys: [place]    1. The left ventricle has normal systolic function, with an ejection fraction of 55-60%. The cavity size was normal. There is mildly increased left ventricular wall thickness. Left ventricular diastolic Doppler parameters are consistent with impaired  relaxation. Indeterminate filling pressures.  2. The right ventricle has normal systolic function. The cavity was normal. There is no increase in right ventricular wall thickness. Right ventricular systolic pressure is moderately elevated with an estimated pressure of 64.5 mmHg.  3. Left atrial size was moderately dilated.  4. The mitral valve is degenerative. Mild thickening of the mitral valve leaflet. There is moderate mitral annular calcification present.  5. The aortic valve is abnormal. Severely  thickening of the aortic valve. Severe calcifcation of the aortic valve. Aortic valve regurgitation is moderate by color flow Doppler. Severe stenosis of the aortic valve.  6. The interatrial septum appears to be lipomatous.  FINDINGS  Left Ventricle: The left ventricle has normal systolic function, with an ejection fraction of 55-60%. The cavity size was normal. There is mildly increased left ventricular wall thickness. Left ventricular diastolic Doppler parameters are consistent  with impaired relaxation. Indeterminate filling pressures  Right Ventricle: The right ventricle has normal systolic function. The cavity was normal. There is no increase in right ventricular wall thickness. Right ventricular systolic pressure is moderately elevated with an estimated pressure of 64.5 mmHg.  Left Atrium: Left atrial size was moderately dilated.  Right Atrium: Right atrial size was normal in size. Right atrial pressure is estimated at 10 mmHg.  Interatrial Septum: No atrial level shunt detected by color flow Doppler. Increased thickness of the atrial septum sparing the fossa ovalis consistent with The interatrial septum appears to be lipomatous.  Pericardium: There is no evidence of pericardial effusion.  Mitral Valve: The mitral valve is degenerative in appearance. Mild thickening of the mitral valve leaflet. There is moderate mitral annular calcification present. Mitral valve regurgitation is mild by color flow Doppler.  Tricuspid Valve: The tricuspid valve is normal in structure. Tricuspid valve regurgitation is trivial by color flow Doppler.  Aortic Valve: The aortic valve is abnormal Severely thickening of the aortic valve. Severe calcifcation of the aortic valve. Aortic valve regurgitation is moderate by color flow Doppler. There is Severe stenosis of the aortic valve, with a calculated  valve area of 1.14 cm.  Pulmonic Valve: The pulmonic valve was normal in structure. Pulmonic valve  regurgitation is not visualized by color flow Doppler.    +--------------+--------++  LEFT VENTRICLE            +----------------+---------++ +--------------+--------++  Diastology                    PLAX 2D                   +----------------+---------++ +--------------+--------++  LV e' lateral:   8.38 cm/s    LVIDd:         3.60 cm    +----------------+---------++ +--------------+--------++  LV E/e' lateral: 11.1         LVIDs:         2.60 cm    +----------------+---------++ +--------------+--------++  LV e' medial:    6.74 cm/s    LV PW:  0.90 cm    +----------------+---------++ +--------------+--------++  LV E/e' medial:  13.8         LV IVS:        0.90 cm    +----------------+---------++ +--------------+--------++  LVOT diam:     2.20 cm    +--------------+--------++  LV SV:         30 ml      +--------------+--------++  LV SV Index:   18.25      +--------------+--------++  LVOT Area:     3.80 cm   +--------------+--------++                            +--------------+--------++  +---------------+----------++  RIGHT VENTRICLE              +---------------+----------++  RV Basal diam:  3.50 cm      +---------------+----------++  RV S prime:     12.50 cm/s   +---------------+----------++  TAPSE (M-mode): 1.6 cm       +---------------+----------++  RVSP:           57.5 mmHg    +---------------+----------++  +---------------+-------++-----------++  LEFT ATRIUM              Index         +---------------+-------++-----------++  LA diam:        4.70 cm  2.82 cm/m    +---------------+-------++-----------++  LA Vol (A2C):   57.8 ml  34.63 ml/m   +---------------+-------++-----------++  LA Vol (A4C):   59.7 ml  35.77 ml/m   +---------------+-------++-----------++  LA Biplane Vol: 60.9 ml  36.49 ml/m   +---------------+-------++-----------++ +------------+---------++-----------++  RIGHT ATRIUM            Index          +------------+---------++-----------++  RA Pressure: 3.00 mmHg                +------------+---------++-----------++  RA Area:     13.00 cm                +------------+---------++-----------++  RA Volume:   34.70 ml   20.79 ml/m   +------------+---------++-----------++  +------------------+------------++  AORTIC VALVE                      +------------------+------------++  AV Area (Vmax):    0.90 cm       +------------------+------------++  AV Area (Vmean):   0.93 cm       +------------------+------------++  AV Area (VTI):     1.14 cm       +------------------+------------++  AV Vmax:           394.00 cm/s    +------------------+------------++  AV Vmean:          280.200 cm/s   +------------------+------------++  AV VTI:            0.770 m        +------------------+------------++  AV Peak Grad:      62.1 mmHg      +------------------+------------++  AV Mean Grad:      39.0 mmHg      +------------------+------------++  LVOT Vmax:         93.43 cm/s     +------------------+------------++  LVOT Vmean:        68.333 cm/s    +------------------+------------++  LVOT VTI:          0.230 m        +------------------+------------++  LVOT/AV VTI ratio: 0.30           +------------------+------------++  AR PHT:            222 msec       +------------------+------------++   +-------------+-------++  AORTA                   +-------------+-------++  Ao Root diam: 3.20 cm   +-------------+-------++  Ao Asc diam:  3.20 cm   +-------------+-------++  +--------------+----------++  +---------------+-----------++  MITRAL VALVE                  TRICUSPID VALVE               +--------------+----------++  +---------------+-----------++  MV Area (PHT): 3.77 cm       TR Peak grad:   54.5 mmHg     +--------------+----------++  +---------------+-----------++  MV PHT:        58.29 msec     TR Vmax:        369.00 cm/s   +--------------+----------++   +---------------+-----------++  MV Decel Time: 201 msec       Estimated RAP:  3.00 mmHg     +--------------+----------++  +---------------+-----------++ +--------------+-----------++  RVSP:           57.5 mmHg      MV E velocity: 93.00 cm/s    +---------------+-----------++ +--------------+-----------++  MV A velocity: 110.00 cm/s   +--------------+-------+ +--------------+-----------++  SHUNTS                   MV E/A ratio:  0.85          +--------------+-------+ +--------------+-----------++  Systemic VTI:  0.23 m                                 +--------------+-------+                                Systemic Diam: 2.20 cm                                +--------------+-------+    Skeet Latch MD Electronically signed by Skeet Latch MD Signature Date/Time: 01/17/2019/6:36:07 PM     RIGHT/LEFT HEART CATH AND CORONARY/GRAFT ANGIOGRAPHY  Conclusion  1. Severe multivessel CAD with severe left main stenosis, and total occlusion of the LCx, LAD, and RCA 2. S/P remote multivessel CABG with continued patency of the LIMA-LAD, SVG-OM, and SVG-RCA 3. Known severe AS with visible calcification and restriction of the aortic valve leaflets by plain fluoroscopy 4. Moderate pulmonary hypertension, normal PCWP  Plan: continued TAVR evaluation  Indications  Severe aortic stenosis [I35.0 (ICD-10-CM)]  Procedural Details  Technical Details INDICATION: 85 yo gentleman with remote CABG who has developed severe symptomatic aortic stenosis with NYHA functional class III dyspnea  PROCEDURAL DETAILS: There was an indwelling IV in a right antecubital vein. Using normal sterile technique, the IV was changed out for a 5 Fr brachial sheath over a 0.018 inch wire. The left wrist was then prepped, draped, and anesthetized with 1% lidocaine. Using the modified Seldinger technique a 5/6 French Slender sheath was placed in the left radial artery. Intra-arterial verapamil was administered through the  radial artery sheath. IV heparin was administered after a JR4 catheter was advanced into the central aorta. A Swan-Ganz catheter was used for the right heart catheterization. Standard protocol was followed for recording of right  heart pressures and sampling of oxygen saturations. Fick cardiac output was calculated. Standard Judkins catheters were used for selective coronary angiography and bypass graft angiography. There were no immediate procedural complications. The patient was transferred to the post catheterization recovery area for further monitoring.    Estimated blood loss <50 mL.   During this procedure medications were administered to achieve and maintain moderate conscious sedation while the patient's heart rate, blood pressure, and oxygen saturation were continuously monitored and I was present face-to-face 100% of this time.  Medications (Filter: Administrations occurring from 04/28/19 0718 to 04/28/19 0851) Medication Rate/Dose/Volume Action  Date Time   midazolam (VERSED) injection (mg) 1 mg Given 04/28/19 0739   Total dose as of 04/28/19 0851        1 mg        lidocaine (PF) (XYLOCAINE) 1 % injection (mL) 2 mL Given 04/28/19 0747   Total dose as of 04/28/19 0851        2 mL        Radial Cocktail/Verapamil only (mL) 10 mL Given 04/28/19 0810   Total dose as of 04/28/19 0851        10 mL        heparin injection (Units) 3,000 Units Given 04/28/19 0823   Total dose as of 04/28/19 0851        3,000 Units        Heparin (Porcine) in NaCl 1000-0.9 UT/500ML-% SOLN (mL) 500 mL Given 04/28/19 0840   Total dose as of 04/28/19 0851 500 mL Given 0840   1,500 mL 500 mL Given 0840   lidocaine (PF) (XYLOCAINE) 1 % injection (mL) 2 mL Given 04/28/19 0800   Total dose as of 04/28/19 0851        2 mL        Sedation Time  Sedation Time Physician-1: 56 minutes 13 seconds  Radiation/Fluoro  Fluoro time: 8.8 (min) DAP: 6474 (mGycm2) Cumulative Air Kerma: 109 (mGy)  Coronary  Findings  Diagnostic Dominance: Right Left Main  Ost LM to Mid LM lesion 90% stenosed  Ost LM to Mid LM lesion is 90% stenosed.  Left Anterior Descending  Prox LAD lesion 100% stenosed  Prox LAD lesion is 100% stenosed.  Left Circumflex  Ost Cx to Prox Cx lesion 100% stenosed  Ost Cx to Prox Cx lesion is 100% stenosed.  First Obtuse Marginal Branch  1st Mrg-1 lesion 95% stenosed  1st Mrg-1 lesion is 95% stenosed.  1st Mrg-2 lesion 40% stenosed  1st Mrg-2 lesion is 40% stenosed.  Right Coronary Artery  Ost RCA to Prox RCA lesion 100% stenosed  Ost RCA to Prox RCA lesion is 100% stenosed. The lesion is severely calcified.  Graft to 1st Mrg  LIMA Graft to Mid LAD  Graft to Dist RCA  Intervention  No interventions have been documented. Left Heart  Aortic Valve The aortic valve is calcified. There is restricted aortic valve motion.  Coronary Diagrams  Diagnostic Dominance: Right  Intervention  Implants   No implant documentation for this case.  Syngo Images  Show images for CARDIAC CATHETERIZATION  Images on Long Term Storage  Show images for Shun, Maruna to Procedure Log  Procedure Log    Hemo Data   Most Recent Value  Fick Cardiac Output 3.98 L/min  Fick Cardiac Output Index 2.43 (L/min)/BSA  RA A Wave 5 mmHg  RA V Wave 5 mmHg  RA Mean 3 mmHg  RV Systolic Pressure 56  mmHg  RV Diastolic Pressure 0 mmHg  RV EDP 5 mmHg  PA Systolic Pressure 56 mmHg  PA Diastolic Pressure 23 mmHg  PA Mean 35 mmHg  PW A Wave 17 mmHg  PW V Wave 16 mmHg  PW Mean 14 mmHg  AO Systolic Pressure 0000000 mmHg  AO Diastolic Pressure 55 mmHg  AO Mean 87 mmHg  QP/QS 1  TPVR Index 14.41 HRUI  TSVR Index 32.95 HRUI  PVR SVR Ratio 0.27  TPVR/TSVR Ratio 0.44      Cardiac TAVR CT  TECHNIQUE: The patient was scanned on a [employer]. A 120 kV retrospective scan was triggered in the descending thoracic aorta at 111 HU's. Gantry rotation speed was 250  msecs and collimation was .6 mm. No beta blockade or nitro were given. The 3D data set was reconstructed in 5% intervals of the R-R cycle. Systolic and diastolic phases were analyzed on a dedicated work station using MPR, MIP and VRT modes. The patient received 80 cc of contrast.  FINDINGS: Aortic Valve: Trileaflet aortic valve with severely thickened and calcified leaflets with severely restricted leaflet opening and minimal calcifications extending into the LVOT.  Aorta: Normal size, moderate to severe diffuse atherosclerotic plaque and calcifications in the sinotubular junction, aortic arch and descending aorta.  Sinotubular Junction: 33 x 33 mm  Ascending Thoracic Aorta: 37 x 34 mm  Aortic Arch: 30 x 28 mm  Descending Thoracic Aorta: 27 x 26 mm  Sinus of Valsalva Measurements:  Non-coronary: 40 mm  Right -coronary: 38 mm  Left -coronary: 39 mm  Coronary Artery Height above Annulus:  Left Main: 17 mm  Right Coronary (occluded): 23 mm  Virtual Basal Annulus Measurements:  Maximum/Minimum Diameter: 31.6 x 26.7 mm  Mean Diameter: 28.2 mm  Perimeter: 90.3 mm  Area: 623 mm2  Optimum Fluoroscopic Angle for Delivery:  LAO 6 CAU 9  IMPRESSION: 1. Trileaflet aortic valve with severely thickened and calcified leaflets with severely restricted leaflet opening and minimal calcifications extending into the LVOT. Aortic valve calcium score 4497 consistent with severe aortic stenosis. Annular measurements suitable for delivery of a 29 mm Edwards-SAPIEN 3 valve.  2. Sufficient coronary to annulus distance.  3. Optimum Fluoroscopic Angle for Delivery: LAO 6 CAU 9.  4. No thrombus in the left atrial appendage.  Electronically Signed: By: Ena Dawley On: 05/11/2019 23:45    CT ANGIOGRAPHY CHEST, ABDOMEN AND PELVIS  TECHNIQUE: Multidetector CT imaging through the chest, abdomen and pelvis was performed using the standard protocol  during bolus administration of intravenous contrast. Multiplanar reconstructed images and MIPs were obtained and reviewed to evaluate the vascular anatomy.  CONTRAST:  131mL OMNIPAQUE IOHEXOL 350 MG/ML SOLN  COMPARISON:  CT the chest, abdomen and pelvis 01/22/2016.  FINDINGS: CTA CHEST FINDINGS  Cardiovascular: Heart size is normal. There is no significant pericardial fluid, thickening or pericardial calcification. There is aortic atherosclerosis, as well as atherosclerosis of the great vessels of the mediastinum and the coronary arteries, including calcified atherosclerotic plaque in the left main, left anterior descending, left circumflex and right coronary arteries. Status post median sternotomy for CABG including LIMA to the LAD. Severe thickening calcification of the aortic valve.  Mediastinum/Lymph Nodes: No pathologically enlarged mediastinal or hilar lymph nodes. Esophagus is unremarkable in appearance. No axillary lymphadenopathy.  Lungs/Pleura: No suspicious appearing pulmonary nodules or masses are noted. No acute consolidative airspace disease. No pleural effusions. Diffuse bronchial wall thickening with moderate centrilobular and paraseptal emphysema. Nodular areas of pleuroparenchymal scarring  in the lung apices bilaterally (left greater than right), likely to reflect areas of mild post infectious or inflammatory scarring.  Musculoskeletal/Soft Tissues: Median sternotomy wires. Multiple compression fractures are noted at T8, T11 and T12, most severe at T11 where there is 70% loss of anterior vertebral body height. Most of these appear chronic, however, at T11 there are clearly visualized fracture lines, without significant paravertebral soft tissue swelling (suggestive of a remote subacute injury). There are no aggressive appearing lytic or blastic lesions noted in the visualized portions of the skeleton.  CTA ABDOMEN AND PELVIS  FINDINGS  Hepatobiliary: No suspicious cystic or solid hepatic lesions. No intra or extrahepatic biliary ductal dilatation. Partially calcified gallstones lying dependently in the gallbladder. Gallbladder is nearly completely contracted without surrounding inflammatory changes to suggest an acute cholecystitis at this time.  Pancreas: No pancreatic mass. No pancreatic ductal dilatation. No pancreatic or peripancreatic fluid collections or inflammatory changes.  Spleen: Unremarkable.  Adrenals/Urinary Tract: In the posterior aspect of the lower pole of the right kidney there is an exophytic 1.3 cm enhancing lesion, highly concerning for small renal cell carcinoma, new compared to the prior study from 09/23/2017. Subcentimeter low-attenuation lesions in both kidneys, too small to characterize, but statistically likely to represent tiny cysts. No hydroureteronephrosis. In the left side of the urinary bladder there is a 1.1 x 1.0 cm enhancing lesion (axial image 198 of series 15), concerning for potential urothelial neoplasm. There is also a small amount of thickening and enhancement of the posterolateral wall of the urinary bladder on the right side (axial image 202 of series 15). Mild bilateral adrenal form adrenal thickening, nonspecific.  Stomach/Bowel: Normal appearance of the stomach. No pathologic dilatation of small bowel or colon. Normal appendix.  Vascular/Lymphatic: Aortic atherosclerosis, with fusiform aneurysmal dilatation of the infrarenal abdominal aorta measuring up to 2.8 x 3.2 cm immediately above the level of the bifurcation (axial image 152 of series 15). Vascular findings and measurements pertinent to potential T AVR procedure, as detailed below. No lymphadenopathy noted in the abdomen or pelvis.  Reproductive: Status post hysterectomy. Ovaries are not confidently identified may be surgically absent or atrophic.  Other: No significant volume of  ascites.  No pneumoperitoneum.  Musculoskeletal: Chronic appearing compression fracture of L1 with 60% loss of anterior vertebral body height is unchanged. Mildly expansile mixed lucent and sclerotic appearance of the left ilium, similar to prior studies, likely reflective of Paget's disease. There are no aggressive appearing lytic or blastic lesions noted in the visualized portions of the skeleton.  VASCULAR MEASUREMENTS PERTINENT TO TAVR:  AORTA:  Minimal Aortic Diameter-15 x 10 mm  Severity of Aortic Calcification-severe  RIGHT PELVIS:  Right Common Iliac Artery -  Minimal Diameter-6.7 x 3.8 mm  Tortuosity-mild  Calcification-severe  Right External Iliac Artery -  Minimal Diameter-7.8 x 7.1 mm  Tortuosity-moderate  Calcification - moderate  Right Common Femoral Artery -  Minimal Diameter-6.2 x 6.2 mm  Tortuosity-mild  Calcification - moderate  LEFT PELVIS:  Left Common Iliac Artery -  Minimal Diameter-7.6 x 5.3 mm  Tortuosity-mild  Calcification-severe  Left External Iliac Artery -  Minimal Diameter-6.0 x 5.6 mm  Tortuosity-moderate  Calcification - moderate  Left Common Femoral Artery -  Minimal Diameter-8.0 x 7.0 mm  Tortuosity-mild  Calcification-moderate  Review of the MIP images confirms the above findings.  IMPRESSION: 1. Vascular findings and measurements pertinent to potential TAVR procedure, as above. 2. Severe thickening calcification of the aortic valve, compatible with the reported clinical  history of severe aortic stenosis. 3. Aortic atherosclerosis, in addition to left main and 3 vessel coronary artery disease. Status post median sternotomy for CABG including LIMA to the LAD. 4. Small enhancing areas in the urinary bladder posteriorly concerning for recurrent urothelial neoplasm. 5. In addition, today's study demonstrates an enhancing exophytic lesion measuring 1.3 cm in the posterior  aspect of the lower pole of the right kidney, concerning for small renal cell carcinoma. Outpatient referral to Urology for further evaluation is recommended. 6. In addition to numerous chronic compression fractures, there is a new compression fracture of T11 which has imaging characteristics suggestive of a remote subacute fracture, with 70% loss of anterior vertebral body height at this time. 7. Additional incidental findings, as above.   Electronically Signed   By: Vinnie Langton M.D.   On: 05/11/2019 10:54    EKG: NSR w/out acute ischemic changes or AV conduction delay    Impression:  Patient has stage D severe symptomatic aortic stenosis.  He presents with progressive symptoms of exertional shortness of breath and fatigue consistent with chronic diastolic congestive heart failure, New York Heart Association functional class III.  The patient has also suffered to frank syncopal episodes.  I have personally reviewed the patient's most recent transthoracic echocardiogram, diagnostic cardiac catheterization, and CT angiograms.  Echocardiogram reveals severe aortic stenosis.  The aortic valve is trileaflet.  There is severe thickening, calcification, and restricted leaflet mobility involving all 3 leaflets of the aortic valve.  Peak velocity across the aortic valve was reported 3.9 m/s corresponding to mean transvalvular gradient estimated 39 mmHg and aortic valve area calculated 0.90 cm.  Diagnostic cardiac catheterization is notable for severe native three-vessel coronary artery disease but continued patency of all 3 bypass grafts placed at the time of the patient's previous bypass surgery many years ago.  There is mild pulmonary hypertension.  Cardiac-gated CTA of the heart reveals anatomical characteristics consistent with aortic stenosis suitable for treatment by transcatheter aortic valve replacement without any significant complicating features and CTA of the aorta and iliac  vessels demonstrate what appears to be adequate pelvic vascular access to facilitate a transfemoral approach.  EKG reveals sinus rhythm with no significant AV conduction delay.  I agree the patient would likely benefit from aortic valve replacement with respect to symptomatic improvement and potential for prolongation of his life.  However, I would not consider this somewhat frail elderly gentleman a candidate for conventional surgical aortic valve replacement via redo sternotomy under any circumstances.  Alternatives include transcatheter aortic valve replacement versus long-term palliative medical therapy.    Plan:  The patient was counseled at length regarding treatment alternatives for management of severe symptomatic aortic stenosis. Alternative approaches such as conventional aortic valve replacement, transcatheter aortic valve replacement, and continued medical therapy without intervention were compared and contrasted at length.  The risks associated with conventional surgical aortic valve replacement were discussed in detail, as were expectations for post-operative convalescence, and why I would be reluctant to consider this patient a candidate for conventional surgery.  Issues specific to transcatheter aortic valve replacement were discussed including questions about long term valve durability, the potential for paravalvular leak, possible increased risk of need for permanent pacemaker placement, and other technical complications related to the procedure itself.  Long-term prognosis with medical therapy was discussed. This discussion was placed in the context of the patient's own specific clinical presentation and past medical history.  Expectations for the patient's postoperative convalescence following uncomplicated transcatheter aortic valve replacement  have been discussed, including the possibility that the patient might need short-term placement in a skilled nursing facility if he has any  setbacks at all.  All of his questions have been addressed.  The patient desires to proceed with transcatheter aortic valve replacement as soon as possible.  Following the decision to proceed with transcatheter aortic valve replacement, a discussion has been held regarding what types of management strategies would be attempted intraoperatively in the event of life-threatening complications, including whether or not the patient would be considered a candidate for the use of cardiopulmonary bypass and/or conversion to open sternotomy for attempted surgical intervention.  The patient has been advised of a variety of complications that might develop including but not limited to risks of death, stroke, paravalvular leak, aortic dissection or other major vascular complications, aortic annulus rupture, device embolization, cardiac rupture or perforation, mitral regurgitation, acute myocardial infarction, arrhythmia, heart block or bradycardia requiring permanent pacemaker placement, congestive heart failure, respiratory failure, renal failure, pneumonia, infection, other late complications related to structural valve deterioration or migration, or other complications that might ultimately cause a temporary or permanent loss of functional independence or other long term morbidity.  The patient provides full informed consent for the procedure as described and all questions were answered.    I spent in excess of 90 minutes during the conduct of this office consultation and >50% of this time involved direct face-to-face encounter with the patient for counseling and/or coordination of their care.    Valentina Gu. Roxy Manns, MD 05/16/2019 3:24 PM

## 2019-05-16 NOTE — Patient Instructions (Addendum)
   Continue taking all current medications without change through the day before surgery.  Have nothing to eat or drink after midnight the night before surgery.  On the morning of surgery take only Synthroid with a sip of water.     

## 2019-05-17 ENCOUNTER — Encounter: Payer: Self-pay | Admitting: Physical Therapy

## 2019-05-17 ENCOUNTER — Other Ambulatory Visit: Payer: Self-pay | Admitting: Physician Assistant

## 2019-05-17 NOTE — Therapy (Signed)
[place], [place], [address] Phone: [phone]   Fax:  [phone]  Physical Therapy Evaluation  Patient Details  Name: Raymond Swanson MRN: [id number] Date of Birth: 1924-12-17 Referring Provider (PT): Dr Sherren Mocha    Encounter Date: 05/16/2019  PT End of Session - 05/17/19 0800    Visit Number  1    Number of Visits  1    Date for PT Re-Evaluation  05/17/19    PT Start Time  0520    PT Stop Time  0550    PT Time Calculation (min)  30 min    Activity Tolerance  Patient tolerated treatment well    Behavior During Therapy  [facility] for tasks assessed/performed       Past Medical History:  Diagnosis Date  . Acute gastric ulcer with hemorrhage, without mention of obstruction 09/10/2009  . ANEMIA 10/08/2009  . CORONARY ARTERY DISEASE 01/26/2007   s/p remote CABG x3V  . History of kidney stones   . HOH (hard of hearing)   . Hx of bladder cancer    s/p TURBT by Dr. Lovena Neighbours  . HYPERLIPIDEMIA 01/26/2007  . HYPERTENSION 01/26/2007  . HYPOTHYROIDISM 10/18/2007  . PROSTATE CANCER, HX OF 01/26/2007  . SKIN CANCER, HX OF 01/26/2007  . Subdural hematoma ([place]) 2016   fall from deck while cleaning gutters; sustained subdemral hematoma ; doing ok now     Past Surgical History:  Procedure Laterality Date  . CATARACT EXTRACTION    . CORONARY ARTERY BYPASS GRAFT     1991; reports at PAT appt 10-11-17: i went to my priamry doctor for a physical and had aroutine stress test that was bad, was sent for cath ( more than 1 but nsure how many or if stents were placed), he rprots " that didnt work so they did the surgery". denies heart symptoms for over 20 years;  sees  cardiology [facility]rein annually for EKGs   . CYSTOSCOPY W/ URETERAL STENT PLACEMENT Right 10/13/2017   Procedure: CYSTOSCOPY WITH STENT REPLACEMENT;  Surgeon: Ceasar Mons, MD;  Location: [facility];  Service: Urology;  Laterality: Right;  . EXCISION MASS  UPPER EXTREMETIES Left 08/11/2016   Procedure: EXCISION MASS LEFT SHOULDER, LEFT FOREARM, LEFT WRIST;  Surgeon: Judeth Horn, MD;  Location: [facility];  Service: General;  Laterality: Left;  . HERNIA REPAIR     ingunial  . MOHS SURGERY    . PROSTATE SURGERY     prostatectomy  . RIGHT/LEFT HEART CATH AND CORONARY/GRAFT ANGIOGRAPHY N/A 04/28/2019   Procedure: RIGHT/LEFT HEART CATH AND CORONARY/GRAFT ANGIOGRAPHY;  Surgeon: Sherren Mocha, MD;  Location: [facility];  Service: Cardiovascular;  Laterality: N/A;  . TEE WITHOUT CARDIOVERSION N/A 11/17/2017   Procedure: TRANSESOPHAGEAL ECHOCARDIOGRAM (TEE);  Surgeon: Dixie Dials, MD;  Location[facility];  Service: Cardiovascular;  Laterality: N/A;  . TRANSURETHRAL RESECTION OF BLADDER TUMOR N/A 10/13/2017   Procedure: TRANSURETHRAL RESECTION OF BLADDER TUMOR/ BIPOLAR (TURBT);  Surgeon: Ceasar Mons, MD;  Location: [facility];  Service: Urology;  Laterality: N/A;  ONLY NEEDS 90 MIN FOR BOTH PROCEDURES    There were no vitals filed for this visit.   Subjective Assessment - 05/16/19 1717    Subjective  Patient had a gradual onset of dyspnea over the past 3-4 months. He has minor back pain that came on after a fall a few months ago.    Currently in Pain?  No/denies   minor lower back pain  at times        [facility] PT Assessment - 05/17/19 0001      Assessment   Medical Diagnosis  Severe Aortic Stenosis     Referring Provider (PT)  Dr Sherren Mocha     Onset Date/Surgical Date  --   4 months prior    Hand Dominance  Right    Prior Therapy  none       Precautions   Precautions  Fall    Precaution Comments  gait belt used       Restrictions   Weight Bearing Restrictions  No      Balance Screen   Has the patient fallen in the past 6 months  No    Has the patient had a decrease in activity level because of a fear of falling?   No    Is the patient reluctant to leave their home because of a fear of falling?   No      Prior  Function   Level of Independence  Independent with household mobility with device    Vocation  Retired      Associate Professor   Overall Cognitive Status  Within Functional Limits for tasks assessed    Attention  Focused    Focused Attention  Appears intact    Memory  Appears intact    Awareness  Appears intact    Problem Solving  Appears intact      Observation/Other Assessments   Observations  Very hard of hearing      Sensation   Light Touch  Appears Intact    Additional Comments  denies parathesias       Coordination   Gross Motor Movements are Fluid and Coordinated  Yes    Fine Motor Movements are Fluid and Coordinated  Yes      AROM   Overall AROM Comments  full active UE and LE ROM       Strength   Overall Strength Comments  UE/.LE strength WNL     Right Hand Grip (lbs)  35    Left Hand Grip (lbs)  35      Ambulation/Gait   Gait Comments  walks with flexed posture; decreased jip flexion with some lateral movement        [facility] Pre-Surgical Assessment - 05/17/19 0001    5 Meter Walk Test- trial 1  10 sec    5 Meter Walk Test- trial 2  11 sec.     5 Meter Walk Test- trial 3  11 sec.    5 meter walk test average  10.67 sec    4 Stage Balance Test tolerated for:   2 sec.    comment  unable to come to full tandem     Sit To Stand Test- trial 1  0 sec.    Other comment  Patient very hard of hearing. Did not understand questioning     6 Minute Walk- Baseline  yes    BP (mmHg)  118/66    HR (bpm)  72    02 Sat (%RA)  96 %    Modified Borg Scale for Dyspnea  1- Very mild shortness of breath    Perceived Rate of Exertion (Borg)  6-    6 Minute Walk Post Test  yes    BP (mmHg)  144/66    HR (bpm)  74    02 Sat (%RA)  94 %    Modified Borg Scale for Dyspnea  3- Moderate  shortness of breath or breathing difficulty    Perceived Rate of Exertion (Borg)  12-    Aerobic Endurance Distance Walked  550    Endurance additional comments  No seated rest break with a walker; 59%  disability               Objective measurements completed on examination: See above findings.              PT Education - 05/17/19 0759    Education Details  purpose of testing    Person(s) Educated  Patient    Methods  Explanation;Demonstration;Tactile cues;Verbal cues    Comprehension  Verbalized understanding;Returned demonstration;Verbal cues required                  Plan - 05/17/19 0800    Clinical Impression Statement  See below    Stability/Clinical Decision Making  Stable/Uncomplicated    Clinical Decision Making  Low    Rehab Potential  Good    PT Frequency  One time visit    PT Treatment/Interventions  Patient/family education        Clinical Impression Statement: Pt is a 85 yo male presenting to OP PT for evaluation prior to possible TAVR surgery due to severe aortic stenosis. Pt reports onset of dyspnea approximately 4 months ago. Symptoms are limiting his ability to ambulate household and community distances. Pt presents with normal ROM and strength, decreased balance and is at high fall risk 4 stage balance test, decreased walking speed and decreased aerobic endurance per 6 minute walk test. Pt ambulated 550 feet in 6 min. At time of rest, patient's HR was 74 bpm and O2 was 94% on room air. Pt reported 3/10 shortness of breath on modified scale for dyspnea.. B/P increased significantly with 6 minute walk test. Based on the Short Physical Performance Battery, patient has a frailty rating of 3/12 with </= 5/12 considered frail.   Patient will benefit from skilled therapeutic intervention in order to improve the following deficits and impairments:  Difficulty walking, Decreased endurance  Visit Diagnosis: Other abnormalities of gait and mobility     Problem List Patient Active Problem List   Diagnosis Date Noted  . Syncope and collapse 02/09/2019  . NSVT (nonsustained ventricular tachycardia) ([place]) 02/09/2019  . HOH (hard of hearing)  02/09/2019  . Severe aortic stenosis 11/17/2017  . Malignant neoplasm of posterior wall of urinary bladder ([place])   . Palliative care encounter   . Enterococcal bacteremia 11/14/2017  . Elevated troponin   . AKI (acute kidney injury) ([place])   . Restless leg syndrome, controlled 04/27/2017  . Squamous cell carcinoma of multiple sites 08/25/2016  . Sleep disturbance 03/11/2016  . Fall 01/27/2016  . Fracture of multiple ribs of left side 01/27/2016  . T8 vertebral fracture ([place]) 01/27/2016  . L1 vertebral fracture ([place]) 01/27/2016  . Lumbar transverse process fracture ([place]) 01/27/2016  . Pressure ulcer 01/26/2016  . ANEMIA 10/08/2009  . ACUT GASTR ULCER W/HEMORR W/O MENTION OBST 09/10/2009  . Hypothyroidism 10/18/2007  . Dyslipidemia 01/26/2007  . Essential hypertension 01/26/2007  . Hx of CABG 01/26/2007  . PROSTATE CANCER, HX OF 01/26/2007  . SKIN CANCER, HX OF 01/26/2007    [name] PT DPT  05/17/2019, 8:57 AM  [facility] [address] [place], [place], [address] Phone: [phone]   Fax:  [phone]  Name: Raymond Swanson MRN: [id number] Date of Birth: 06-24-1925

## 2019-05-18 ENCOUNTER — Other Ambulatory Visit: Payer: Self-pay | Admitting: Physician Assistant

## 2019-05-18 NOTE — Pre-Procedure Instructions (Addendum)
Raymond Swanson  05/18/2019     Your procedure is scheduled on Tuesday, May 23, 2019 at 9:15 AM.   Report to [facility] Entrance "A" Admitting Office at 7:00 AM.   Call this number if you have problems the morning of surgery: [phone]   Questions prior to day of surgery, please call [phone] between 8 & 4 PM.   Remember:  Do not eat or drink after midnight Monday, 05/22/19.  Do not take any medications the morning of surgery.  Stop Multivitamins and Melatonin as of today prior to surgery.    Do not wear jewelry.  Do not wear lotions, powders, cologne or deodorant.  Men may shave face and neck.  Do not bring valuables to the hospital.  [facility] is not responsible for any belongings or valuables.  Contacts, dentures or bridgework may not be worn into surgery.  Leave your suitcase in the car.  After surgery it may be brought to your room.  For patients admitted to the hospital, discharge time will be determined by your treatment team.  [facility] - Preparing for Surgery  Before surgery, you can play an important role.  Because skin is not sterile, your skin needs to be as free of germs as possible.  You can reduce the number of germs on you skin by washing with CHG (chlorahexidine gluconate) soap before surgery.  CHG is an antiseptic cleaner which kills germs and bonds with the skin to continue killing germs even after washing.  Oral Hygiene is also important in reducing the risk of infection.  Remember to brush your teeth with your regular toothpaste the morning of surgery.  Please DO NOT use if you have an allergy to CHG or antibacterial soaps.  If your skin becomes reddened/irritated stop using the CHG and inform your nurse when you arrive at Short Stay.  Do not shave (including legs and underarms) for at least 48 hours prior to the first CHG shower.  You may shave your face.  Please follow these instructions carefully:   1.  Shower with CHG Soap the  night before surgery and the morning of Surgery.  2.  If you choose to wash your hair, wash your hair first as usual with your normal shampoo.  3.  After you shampoo, rinse your hair and body thoroughly to remove the shampoo. 4.  Use CHG as you would any other liquid soap.  You can apply chg directly to the skin and wash gently with a      scrungie or washcloth.           5.  Apply the CHG Soap to your body ONLY FROM THE NECK DOWN.   Do not use on open wounds or open sores. Avoid contact with your eyes, ears, mouth and genitals (private parts).  Wash genitals (private parts) with your normal soap - do this prior to using CHG soap.  6.  Wash thoroughly, paying special attention to the area where your surgery will be performed.  7.  Thoroughly rinse your body with warm water from the neck down.  8.  DO NOT shower/wash with your normal soap after using and rinsing off the CHG Soap.  9.  Pat yourself dry with a clean towel.            10.  Wear clean pajamas.            11.  Place clean sheets on your bed the night of your  first shower and do not sleep with pets.  Day of Surgery  Shower as above. Do not apply any lotions/deodorants the morning of surgery.   Please wear clean clothes to the hospital. Remember to brush your teeth with toothpaste.     Please read over the fact sheets that you were given.

## 2019-05-19 ENCOUNTER — Other Ambulatory Visit: Payer: Self-pay

## 2019-05-19 ENCOUNTER — Telehealth: Payer: Self-pay | Admitting: Physician Assistant

## 2019-05-19 ENCOUNTER — Encounter (HOSPITAL_COMMUNITY): Payer: Self-pay

## 2019-05-19 ENCOUNTER — Ambulatory Visit (HOSPITAL_COMMUNITY)
Admission: RE | Admit: 2019-05-19 | Discharge: 2019-05-19 | Disposition: A | Discharge status: Home / Self Care | Payer: Medicare Other | Source: Ambulatory Visit | Attending: Physician Assistant | Admitting: Physician Assistant

## 2019-05-19 ENCOUNTER — Encounter (HOSPITAL_COMMUNITY)
Admission: RE | Admit: 2019-05-19 | Discharge: 2019-05-19 | Disposition: A | Discharge status: Home / Self Care | Payer: Medicare Other | Source: Ambulatory Visit | Attending: Cardiovascular Disease | Admitting: Cardiovascular Disease

## 2019-05-19 ENCOUNTER — Other Ambulatory Visit: Payer: Self-pay | Admitting: Physician Assistant

## 2019-05-19 ENCOUNTER — Other Ambulatory Visit (HOSPITAL_COMMUNITY)
Admission: RE | Admit: 2019-05-19 | Discharge: 2019-05-19 | Disposition: A | Discharge status: Home / Self Care | Payer: Medicare Other | Source: Ambulatory Visit | Attending: Cardiovascular Disease | Admitting: Cardiovascular Disease

## 2019-05-19 DIAGNOSIS — Z01811 Encounter for preprocedural respiratory examination: Secondary | ICD-10-CM

## 2019-05-19 DIAGNOSIS — C672 Malignant neoplasm of lateral wall of bladder: Secondary | ICD-10-CM | POA: Diagnosis not present

## 2019-05-19 DIAGNOSIS — Z87891 Personal history of nicotine dependence: Secondary | ICD-10-CM | POA: Insufficient documentation

## 2019-05-19 DIAGNOSIS — Z20828 Contact with and (suspected) exposure to other viral communicable diseases: Secondary | ICD-10-CM | POA: Diagnosis not present

## 2019-05-19 DIAGNOSIS — E039 Hypothyroidism, unspecified: Secondary | ICD-10-CM | POA: Diagnosis not present

## 2019-05-19 DIAGNOSIS — Z8546 Personal history of malignant neoplasm of prostate: Secondary | ICD-10-CM | POA: Insufficient documentation

## 2019-05-19 DIAGNOSIS — I350 Nonrheumatic aortic (valve) stenosis: Secondary | ICD-10-CM | POA: Diagnosis not present

## 2019-05-19 DIAGNOSIS — Z01818 Encounter for other preprocedural examination: Secondary | ICD-10-CM | POA: Insufficient documentation

## 2019-05-19 DIAGNOSIS — R310 Gross hematuria: Secondary | ICD-10-CM | POA: Diagnosis not present

## 2019-05-19 DIAGNOSIS — I10 Essential (primary) hypertension: Secondary | ICD-10-CM | POA: Insufficient documentation

## 2019-05-19 DIAGNOSIS — Z8551 Personal history of malignant neoplasm of bladder: Secondary | ICD-10-CM | POA: Diagnosis not present

## 2019-05-19 DIAGNOSIS — I272 Pulmonary hypertension, unspecified: Secondary | ICD-10-CM | POA: Diagnosis not present

## 2019-05-19 DIAGNOSIS — Z79899 Other long term (current) drug therapy: Secondary | ICD-10-CM | POA: Insufficient documentation

## 2019-05-19 DIAGNOSIS — Z7982 Long term (current) use of aspirin: Secondary | ICD-10-CM | POA: Diagnosis not present

## 2019-05-19 DIAGNOSIS — S22000A Wedge compression fracture of unspecified thoracic vertebra, initial encounter for closed fracture: Secondary | ICD-10-CM | POA: Diagnosis not present

## 2019-05-19 DIAGNOSIS — Z85828 Personal history of other malignant neoplasm of skin: Secondary | ICD-10-CM | POA: Insufficient documentation

## 2019-05-19 DIAGNOSIS — I25810 Atherosclerosis of coronary artery bypass graft(s) without angina pectoris: Secondary | ICD-10-CM | POA: Diagnosis not present

## 2019-05-19 DIAGNOSIS — Z79890 Hormone replacement therapy (postmenopausal): Secondary | ICD-10-CM | POA: Diagnosis not present

## 2019-05-19 DIAGNOSIS — N390 Urinary tract infection, site not specified: Secondary | ICD-10-CM

## 2019-05-19 DIAGNOSIS — Z951 Presence of aortocoronary bypass graft: Secondary | ICD-10-CM | POA: Diagnosis not present

## 2019-05-19 DIAGNOSIS — N3000 Acute cystitis without hematuria: Secondary | ICD-10-CM | POA: Diagnosis not present

## 2019-05-19 HISTORY — DX: Dyspnea, unspecified: R06.00

## 2019-05-19 LAB — TYPE AND SCREEN
ABO/RH(D): O POS
Antibody Screen: NEGATIVE

## 2019-05-19 LAB — COMPREHENSIVE METABOLIC PANEL
ALT: 22 U/L (ref 0–44)
AST: 29 U/L (ref 15–41)
Albumin: 3.3 g/dL — ABNORMAL LOW (ref 3.5–5.0)
Alkaline Phosphatase: 84 U/L (ref 38–126)
Anion gap: 12 (ref 5–15)
BUN: 33 mg/dL — ABNORMAL HIGH (ref 8–23)
CO2: 23 mmol/L (ref 22–32)
Calcium: 9.6 mg/dL (ref 8.9–10.3)
Chloride: 107 mmol/L (ref 98–111)
Creatinine, Ser: 1.23 mg/dL (ref 0.61–1.24)
GFR calc Af Amer: 59 mL/min — ABNORMAL LOW (ref >60)
GFR calc non Af Amer: 51 mL/min — ABNORMAL LOW (ref >60)
Glucose, Bld: 121 mg/dL — ABNORMAL HIGH (ref 70–99)
Potassium: 4.5 mmol/L (ref 3.5–5.1)
Sodium: 142 mmol/L (ref 135–145)
Total Bilirubin: 0.6 mg/dL (ref 0.3–1.2)
Total Protein: 6.4 g/dL — ABNORMAL LOW (ref 6.5–8.1)

## 2019-05-19 LAB — SURGICAL PCR SCREEN
MRSA, PCR: NEGATIVE
Staphylococcus aureus: POSITIVE — AB

## 2019-05-19 LAB — CBC
HCT: 37.5 % — ABNORMAL LOW (ref 39.0–52.0)
Hemoglobin: 11.5 g/dL — ABNORMAL LOW (ref 13.0–17.0)
MCH: 26.4 pg (ref 26.0–34.0)
MCHC: 30.7 g/dL (ref 30.0–36.0)
MCV: 86 fL (ref 80.0–100.0)
Platelets: 237 10*3/uL (ref 150–400)
RBC: 4.36 MIL/uL (ref 4.22–5.81)
RDW: 19.9 % — ABNORMAL HIGH (ref 11.5–15.5)
WBC: 7.8 10*3/uL (ref 4.0–10.5)
nRBC: 0 % (ref 0.0–0.2)

## 2019-05-19 LAB — APTT: aPTT: 34 seconds (ref 24–36)

## 2019-05-19 LAB — HEMOGLOBIN A1C
Hgb A1c MFr Bld: 6.1 % — ABNORMAL HIGH (ref 4.8–5.6)
Mean Plasma Glucose: 128.37 mg/dL

## 2019-05-19 LAB — PROTIME-INR
INR: 1 (ref 0.8–1.2)
Prothrombin Time: 13.5 seconds (ref 11.4–15.2)

## 2019-05-19 LAB — BRAIN NATRIURETIC PEPTIDE: B Natriuretic Peptide: 583.6 pg/mL — ABNORMAL HIGH (ref 0.0–100.0)

## 2019-05-19 NOTE — Progress Notes (Signed)
PCP - Dr. Lelon Frohlich Cardiologist - Dr. Percival Spanish  PPM/ICD - No Device Orders -  Rep Notified  Chest x-ray - today EKG - 04/28/19 Stress Test - denies ECHO - 01/17/19 Cardiac Cath - 04/28/19  Sleep Study - denies CPAP -   Fasting Blood Sugar - N/A Checks Blood Sugar _____ times a day  Blood Thinner Instructions: N/A Aspirin Instructions: Continue Aspirin, don't take day of surgery.  ERAS Protcol - N/A PRE-SURGERY Ensure -   COVID TEST- today 05/19/19   Anesthesia review: Yes, Blood in urine  Patient denies shortness of breath, fever, cough and chest pain at PAT appointment   Patient verbalized understanding of instructions that were given to them at the PAT appointment. Patient was also instructed that they will need to review over the PAT instructions again at home before surgery.  Pt states at PAT appt that he had noticed some spotting of blood in his urine the past few days. He states that this AM it was all blood. He has a hx of bladder cancer and sees Dr. Lovena Neighbours at [facility]. I spoke with his friend/driver/assistant and she states she will call the office and try to get him in for an appt. I notified Katie, RN at Dr. Antionette Char office of this issue. I called pt this afternoon to notify him of a positive PCR of Staph and asked him he was able to get in touch with Dr. Jackson Latino office. He stated that he had just finished an appt with Dr. Lovena Neighbours and he has a bladder infection and is now on an antibiotic.

## 2019-05-19 NOTE — Telephone Encounter (Signed)
  Raymond Swanson   Pre admission nurse called to report that Raymond Swanson had frank hematuria. He saw his urologist, Dr. Lovena Neighbours, today and is being treated for a UTI with Cipro. Also has a known history of bladder cancer. Will repeat UA on Monday. If UTI cleared, we will proceed with TAVR.  Angelena Form PA-C  [facility]

## 2019-05-19 NOTE — Progress Notes (Signed)
Left message for Katie at Dr. Antionette Char office notifying her that pt has seen Dr. Lovena Neighbours and is now on antibiotics for a bladder infection. Also notified her of an abnormal BNP result.

## 2019-05-20 LAB — NOVEL CORONAVIRUS, NAA (HOSP ORDER, SEND-OUT TO REF LAB; TAT 18-24 HRS): SARS-CoV-2, NAA: NOT DETECTED

## 2019-05-22 ENCOUNTER — Other Ambulatory Visit: Payer: Medicare Other | Admitting: *Deleted

## 2019-05-22 ENCOUNTER — Other Ambulatory Visit: Payer: Self-pay

## 2019-05-22 DIAGNOSIS — N390 Urinary tract infection, site not specified: Secondary | ICD-10-CM

## 2019-05-22 DIAGNOSIS — R319 Hematuria, unspecified: Secondary | ICD-10-CM

## 2019-05-22 LAB — URINALYSIS, ROUTINE W REFLEX MICROSCOPIC
Bilirubin, UA: NEGATIVE
Glucose, UA: NEGATIVE
Nitrite, UA: NEGATIVE
Specific Gravity, UA: 1.02 (ref 1.005–1.030)
Urobilinogen, Ur: 0.2 mg/dL (ref 0.2–1.0)
pH, UA: 5.5 (ref 5.0–7.5)

## 2019-05-22 LAB — MICROSCOPIC EXAMINATION: Bacteria, UA: NONE SEEN

## 2019-05-22 MED ORDER — SODIUM CHLORIDE 0.9 % IV SOLN
1.5000 g | INTRAVENOUS | Status: AC
  Administered 2019-05-23: 1.5 g via INTRAVENOUS
  Filled 2019-05-22 (×2): qty 1.5

## 2019-05-22 MED ORDER — POTASSIUM CHLORIDE 2 MEQ/ML IV SOLN
80.0000 meq | INTRAVENOUS | Status: DC
  Filled 2019-05-22: qty 40

## 2019-05-22 MED ORDER — NOREPINEPHRINE 4 MG/250ML-% IV SOLN
0.0000 ug/min | INTRAVENOUS | Status: AC
  Administered 2019-05-23: 2 ug/min via INTRAVENOUS
  Filled 2019-05-22: qty 250

## 2019-05-22 MED ORDER — MAGNESIUM SULFATE 50 % IJ SOLN
40.0000 meq | INTRAMUSCULAR | Status: DC
  Filled 2019-05-22: qty 9.85

## 2019-05-22 MED ORDER — DEXMEDETOMIDINE HCL IN NACL 400 MCG/100ML IV SOLN
0.1000 ug/kg/h | INTRAVENOUS | Status: AC
  Administered 2019-05-23: 1 ug/kg/h via INTRAVENOUS
  Filled 2019-05-22: qty 100

## 2019-05-22 MED ORDER — VANCOMYCIN HCL IN DEXTROSE 1-5 GM/200ML-% IV SOLN
1000.0000 mg | INTRAVENOUS | Status: AC
  Administered 2019-05-23: 1000 mg via INTRAVENOUS
  Filled 2019-05-22 (×2): qty 200

## 2019-05-22 MED ORDER — SODIUM CHLORIDE 0.9 % IV SOLN
INTRAVENOUS | Status: DC
  Filled 2019-05-22: qty 30

## 2019-05-22 NOTE — Anesthesia Preprocedure Evaluation (Addendum)
Anesthesia Evaluation  Patient identified by MRN, date of birth, ID band Patient awake    Reviewed: Allergy & Precautions, H&P , NPO status , Patient's Chart, lab work & pertinent test results  Airway Mallampati: II   Neck ROM: full    Dental   Pulmonary former smoker,    breath sounds clear to auscultation       Cardiovascular hypertension, + CAD and + CABG  + Valvular Problems/Murmurs AS  Rhythm:regular Rate:Normal  Severe AS. EF 55-60%   Neuro/Psych    GI/Hepatic PUD,   Endo/Other  Hypothyroidism   Renal/GU stones     Musculoskeletal   Abdominal   Peds  Hematology   Anesthesia Other Findings   Reproductive/Obstetrics                            Anesthesia Physical Anesthesia Plan  ASA: III  Anesthesia Plan: MAC   Post-op Pain Management:    Induction: Intravenous  PONV Risk Score and Plan: 1 and Ondansetron, Dexamethasone and Treatment may vary due to age or medical condition  Airway Management Planned: Simple Face Mask  Additional Equipment:   Intra-op Plan:   Post-operative Plan:   Informed Consent: I have reviewed the patients History and Physical, chart, labs and discussed the procedure including the risks, benefits and alternatives for the proposed anesthesia with the patient or authorized representative who has indicated his/her understanding and acceptance.       Plan Discussed with: CRNA, Anesthesiologist and Surgeon  Anesthesia Plan Comments: (PAT note written 05/22/2019 by Myra Gianotti, PA-C. )       Anesthesia Quick Evaluation

## 2019-05-22 NOTE — Progress Notes (Signed)
Anesthesia Chart Review:  Case: [id number] Date/Time: 05/23/19 0900   Procedures:      TRANSCATHETER AORTIC VALVE REPLACEMENT, TRANSFEMORAL (N/A )     TRANSESOPHAGEAL ECHOCARDIOGRAM (TEE) (N/A )   Anesthesia type: General   Diagnosis: Severe aortic stenosis [I35.0]   Pre-op diagnosis: Severe aortic stenosis   Location: [place] CATH LAB 6 / [place] INVASIVE CV LAB   Provider: Sherren Mocha, MD    CT Surgeon: Darylene Price, MD  DISCUSSION: Patient is a 85 year old Swanson scheduled for the above procedure.  History includes former smoker, CAD (s/p CABG ~ 1991, Dr. Redmond Pulling), severe left ear, hypertension, hyperlipidemia, hypothyroidism, prostate cancer (s/p prostatectomy ~ 1990), bladder cancer (s/p TURBT 10/13/17), SDH (01/2016, after fall, also sustained left rib fractures, T8/L1 compression fracture) ,SCC (multiple LUE cutaneous SCC excisions). Hard of hearing.  01/17/19 echo  showed LVEF 55-60%, severely thickened AV with moderate AR, severe AS (calculated valve area of 1.14 cm, peak gradient Raymond.1 mmHg, mean gradient 39.0 mmHg)..  04/28/19 cardiac cath showed total occlusion of LAD, LCX, and RCA with continued patency of LIMA-LAD, SVG-OM, SVG-RCA, known severe AS, moderate pulmonary hypertension.  Of note, patient with hematuria on 05/19/19 which was noted by Nell Range, PA-C. She notes that patient saw his urologist Dr. Lovena Neighbours that same day and was treated for UTI with Cipro. She wrote, "Also has a known history of bladder cancer. Will repeat UA on Monday. If UTI cleared, we will proceed with TAVR." Per Dr. Burt Knack following 05/10/19 CTA, patient will need outpatient urology follow-up given findings concerning for recurrent urothelial neoplasm and enhancing exophytic lesion on the lower pole of the right kidney concerning or small cell renal carcinoma.  05/19/19 UA results have not yet resulted.  05/19/2019 presurgical COVID-19 test was negative. Currently plans to proceed will depend on if TAVR team feels UA  results are acceptable.    VS: BP 114/71   Pulse 76   Temp 36.5 C (Oral)   Resp 16   Ht 5\' 8"  (1.727 m)   Wt 54.4 kg   SpO2 98%   BMI 18.25 kg/m    PROVIDERS: Isaac Bliss, Rayford Halsted, MD is PCP Minus Breeding, MD is cardiologist Ellison Hughs, [facility] is urologist   LABS: Labs reviewed: Acceptable for surgery. However, 05/22/19 UA is still pending.  (all labs ordered are listed, but only abnormal results are displayed)  Labs Reviewed  SURGICAL PCR SCREEN - Abnormal; Notable for the following components:      Result Value   Staphylococcus aureus POSITIVE (*)    All other components within normal limits  BLOOD GAS, ARTERIAL - Abnormal; Notable for the following components:   pO2, Arterial 65.5 (*)    Bicarbonate 28.8 (*)    Acid-Base Excess 4.7 (*)    All other components within normal limits  BRAIN NATRIURETIC PEPTIDE - Abnormal; Notable for the following components:   B Natriuretic Peptide 583.6 (*)    All other components within normal limits  CBC - Abnormal; Notable for the following components:   Hemoglobin 11.5 (*)    HCT 37.5 (*)    RDW 19.9 (*)    All other components within normal limits  COMPREHENSIVE METABOLIC PANEL - Abnormal; Notable for the following components:   Glucose, Bld 121 (*)    BUN 33 (*)    Total Protein 6.4 (*)    Albumin 3.3 (*)    GFR calc non Af Amer 51 (*)    GFR calc Af Amer 59 (*)  All other components within normal limits  HEMOGLOBIN A1C - Abnormal; Notable for the following components:   Hgb A1c MFr Bld 6.1 (*)    All other components within normal limits  APTT  PROTIME-INR  TYPE AND SCREEN     EKG: 04/28/19: Normal sinus rhythm with sinus arrhythmia ST and T wave abnormality, consider lateral ischemia Abnormal ECG Since previous tracing ST depression, consider subendocardial injury   CV/IMAGING: See Results Review tab--or outlined in Dr. Guy Sandifer consult note.   Carotid US 05/10/19: Summary: Right Carotid:  Velocities in the right ICA are consistent with a 1-39% stenosis.                No change from previous study. Left Carotid: Velocities in the left ICA are consistent with a 1-39% stenosis.               No change from previous sudy. Vertebrals:  Left vertebral artery demonstrates antegrade flow. Right vertebral              artery demonstrates no discernable flow which is a change from              pevious study. Subclavians: Normal flow hemodynamics were seen in bilateral subclavian              arteries.   Past Medical History:  Diagnosis Date  . Acute gastric ulcer with hemorrhage, without mention of obstruction 09/10/2009  . ANEMIA 10/08/2009  . CORONARY ARTERY DISEASE 01/26/2007   s/p remote CABG x3V  . Dyspnea   . History of kidney stones   . HOH (hard of hearing)   . Hx of bladder cancer    s/p TURBT by Dr. Lovena Neighbours  . HYPERLIPIDEMIA 01/26/2007  . HYPERTENSION 01/26/2007  . HYPOTHYROIDISM 10/18/2007  . PROSTATE CANCER, HX OF 01/26/2007  . SKIN CANCER, HX OF 01/26/2007  . Subdural hematoma ([place]) 2016   fall from deck while cleaning gutters; sustained subdemral hematoma ; doing ok now     Past Surgical History:  Procedure Laterality Date  . CATARACT EXTRACTION    . CORONARY ARTERY BYPASS GRAFT     1991; reports at PAT appt 10-11-17: i went to my priamry doctor for a physical and had aroutine stress test that was bad, was sent for cath ( more than 1 but nsure how many or if stents were placed), he rprots " that didnt work so they did the surgery". denies heart symptoms for over 20 years;  sees  cardiology [facility] annually for EKGs   . CYSTOSCOPY W/ URETERAL STENT PLACEMENT Right 10/13/2017   Procedure: CYSTOSCOPY WITH STENT REPLACEMENT;  Surgeon: Ceasar Mons, MD;  Location: [facility];  Service: Urology;  Laterality: Right;  . EXCISION MASS UPPER EXTREMETIES Left 08/11/2016   Procedure: EXCISION MASS LEFT SHOULDER, LEFT FOREARM, LEFT WRIST;  Surgeon: Judeth Horn, MD;   Location: [place];  Service: General;  Laterality: Left;  . HERNIA REPAIR     ingunial  . MOHS SURGERY    . PROSTATE SURGERY     prostatectomy  . RIGHT/LEFT HEART CATH AND CORONARY/GRAFT ANGIOGRAPHY N/A 04/28/2019   Procedure: RIGHT/LEFT HEART CATH AND CORONARY/GRAFT ANGIOGRAPHY;  Surgeon: Sherren Mocha, MD;  Location: [place] CV LAB;  Service: Cardiovascular;  Laterality: N/A;  . TEE WITHOUT CARDIOVERSION N/A 11/17/2017   Procedure: TRANSESOPHAGEAL ECHOCARDIOGRAM (TEE);  Surgeon: Dixie Dials, MD;  Location: [place];  Service: Cardiovascular;  Laterality: N/A;  . TRANSURETHRAL RESECTION OF BLADDER TUMOR N/A 10/13/2017  Procedure: TRANSURETHRAL RESECTION OF BLADDER TUMOR/ BIPOLAR (TURBT);  Surgeon: Ceasar Mons, MD;  Location: [facility];  Service: Urology;  Laterality: N/A;  ONLY NEEDS 90 MIN FOR BOTH PROCEDURES    MEDICATIONS: . acetaminophen (TYLENOL) 500 MG tablet  . aspirin EC 81 MG tablet  . CALCIUM PO  . carboxymethylcellulose (REFRESH TEARS) 0.5 % SOLN  . carvedilol (COREG) 3.125 MG tablet  . Cyanocobalamin (B-12 PO)  . furosemide (LASIX) 20 MG tablet  . levothyroxine (SYNTHROID) 75 MCG tablet  . MELATONIN PO  . Multiple Vitamin (MULTIVITAMIN WITH MINERALS) TABS tablet  . Multiple Vitamins-Minerals (PRESERVISION AREDS 2 PO)  . nitroGLYCERIN (NITROSTAT) 0.4 MG SL tablet  . pramipexole (MIRAPEX) 0.25 MG tablet  . VITAMIN D PO   No current facility-administered medications for this encounter.    Derrill Memo ON 05/23/2019] cefUROXime (ZINACEF) 1.5 g in sodium chloride 0.9 % 100 mL IVPB  . [START ON 05/23/2019] dexmedetomidine (PRECEDEX) 400 MCG/100ML (4 mcg/mL) infusion  . [START ON 05/23/2019] heparin 30,000 units/NS 1000 mL solution for CELLSAVER  . [START ON 05/23/2019] magnesium sulfate (IV Push/IM) injection 40 mEq  . [START ON 05/23/2019] norepinephrine (LEVOPHED) 4mg  in 243mL premix infusion  . [START ON 05/23/2019] potassium chloride injection 80 mEq  . [START  ON 05/23/2019] vancomycin (VANCOCIN) IVPB 1000 mg/200 mL premix     Myra Gianotti, PA-C Surgical Short Stay/Anesthesiology [facility] Phone [phone] [facility] Phone [phone] 05/22/2019 11:22 AM

## 2019-05-23 ENCOUNTER — Other Ambulatory Visit: Payer: Self-pay | Admitting: Physician Assistant

## 2019-05-23 ENCOUNTER — Inpatient Hospital Stay (HOSPITAL_COMMUNITY): Payer: Medicare Other

## 2019-05-23 ENCOUNTER — Other Ambulatory Visit: Payer: Self-pay

## 2019-05-23 ENCOUNTER — Inpatient Hospital Stay (HOSPITAL_COMMUNITY): Payer: Medicare Other | Admitting: Physician Assistant

## 2019-05-23 ENCOUNTER — Inpatient Hospital Stay (HOSPITAL_COMMUNITY)
Admission: RE | Admit: 2019-05-23 | Discharge: 2019-05-25 | DRG: 266 | Disposition: A | Discharge status: Home Health | Payer: Medicare Other | Attending: Cardiovascular Disease | Admitting: Cardiovascular Disease

## 2019-05-23 ENCOUNTER — Inpatient Hospital Stay (HOSPITAL_COMMUNITY): Payer: Medicare Other | Admitting: Vascular Surgery

## 2019-05-23 ENCOUNTER — Encounter (HOSPITAL_COMMUNITY)
Admission: RE | Disposition: A | Discharge status: Home Health | Payer: Medicare Other | Source: Home / Self Care | Attending: Cardiovascular Disease

## 2019-05-23 ENCOUNTER — Encounter (HOSPITAL_COMMUNITY): Payer: Self-pay | Admitting: *Deleted

## 2019-05-23 DIAGNOSIS — I350 Nonrheumatic aortic (valve) stenosis: Secondary | ICD-10-CM | POA: Diagnosis not present

## 2019-05-23 DIAGNOSIS — I2510 Atherosclerotic heart disease of native coronary artery without angina pectoris: Secondary | ICD-10-CM | POA: Diagnosis present

## 2019-05-23 DIAGNOSIS — Z87891 Personal history of nicotine dependence: Secondary | ICD-10-CM

## 2019-05-23 DIAGNOSIS — Z79890 Hormone replacement therapy (postmenopausal): Secondary | ICD-10-CM

## 2019-05-23 DIAGNOSIS — J439 Emphysema, unspecified: Secondary | ICD-10-CM | POA: Diagnosis not present

## 2019-05-23 DIAGNOSIS — D649 Anemia, unspecified: Secondary | ICD-10-CM | POA: Diagnosis present

## 2019-05-23 DIAGNOSIS — Z79899 Other long term (current) drug therapy: Secondary | ICD-10-CM | POA: Diagnosis not present

## 2019-05-23 DIAGNOSIS — Z8551 Personal history of malignant neoplasm of bladder: Secondary | ICD-10-CM | POA: Diagnosis not present

## 2019-05-23 DIAGNOSIS — Z7982 Long term (current) use of aspirin: Secondary | ICD-10-CM

## 2019-05-23 DIAGNOSIS — Z952 Presence of prosthetic heart valve: Secondary | ICD-10-CM | POA: Diagnosis not present

## 2019-05-23 DIAGNOSIS — Z801 Family history of malignant neoplasm of trachea, bronchus and lung: Secondary | ICD-10-CM

## 2019-05-23 DIAGNOSIS — H9190 Unspecified hearing loss, unspecified ear: Secondary | ICD-10-CM | POA: Diagnosis present

## 2019-05-23 DIAGNOSIS — E039 Hypothyroidism, unspecified: Secondary | ICD-10-CM | POA: Diagnosis not present

## 2019-05-23 DIAGNOSIS — R55 Syncope and collapse: Secondary | ICD-10-CM | POA: Diagnosis not present

## 2019-05-23 DIAGNOSIS — N183 Chronic kidney disease, stage 3 unspecified: Secondary | ICD-10-CM | POA: Diagnosis present

## 2019-05-23 DIAGNOSIS — E785 Hyperlipidemia, unspecified: Secondary | ICD-10-CM | POA: Diagnosis not present

## 2019-05-23 DIAGNOSIS — Z20828 Contact with and (suspected) exposure to other viral communicable diseases: Secondary | ICD-10-CM | POA: Diagnosis present

## 2019-05-23 DIAGNOSIS — Z85828 Personal history of other malignant neoplasm of skin: Secondary | ICD-10-CM | POA: Diagnosis not present

## 2019-05-23 DIAGNOSIS — I272 Pulmonary hypertension, unspecified: Secondary | ICD-10-CM | POA: Diagnosis present

## 2019-05-23 DIAGNOSIS — I130 Hypertensive heart and chronic kidney disease with heart failure and stage 1 through stage 4 chronic kidney disease, or unspecified chronic kidney disease: Secondary | ICD-10-CM | POA: Diagnosis not present

## 2019-05-23 DIAGNOSIS — I110 Hypertensive heart disease with heart failure: Secondary | ICD-10-CM | POA: Diagnosis not present

## 2019-05-23 DIAGNOSIS — Z8546 Personal history of malignant neoplasm of prostate: Secondary | ICD-10-CM | POA: Diagnosis not present

## 2019-05-23 DIAGNOSIS — I4729 Other ventricular tachycardia: Secondary | ICD-10-CM

## 2019-05-23 DIAGNOSIS — I472 Ventricular tachycardia: Secondary | ICD-10-CM | POA: Diagnosis present

## 2019-05-23 DIAGNOSIS — Z006 Encounter for examination for normal comparison and control in clinical research program: Secondary | ICD-10-CM | POA: Diagnosis not present

## 2019-05-23 DIAGNOSIS — J9811 Atelectasis: Secondary | ICD-10-CM | POA: Diagnosis not present

## 2019-05-23 DIAGNOSIS — I10 Essential (primary) hypertension: Secondary | ICD-10-CM | POA: Diagnosis present

## 2019-05-23 DIAGNOSIS — I5033 Acute on chronic diastolic (congestive) heart failure: Secondary | ICD-10-CM | POA: Diagnosis not present

## 2019-05-23 DIAGNOSIS — Z951 Presence of aortocoronary bypass graft: Secondary | ICD-10-CM | POA: Diagnosis not present

## 2019-05-23 DIAGNOSIS — C674 Malignant neoplasm of posterior wall of bladder: Secondary | ICD-10-CM | POA: Diagnosis present

## 2019-05-23 HISTORY — PX: TEE WITHOUT CARDIOVERSION: SHX5443

## 2019-05-23 HISTORY — PX: TRANSCATHETER AORTIC VALVE REPLACEMENT, TRANSFEMORAL: SHX6400

## 2019-05-23 LAB — POCT ACTIVATED CLOTTING TIME
Activated Clotting Time: 132 seconds
Activated Clotting Time: 243 seconds
Activated Clotting Time: 115 seconds

## 2019-05-23 LAB — POCT I-STAT, CHEM 8
BUN: 27 mg/dL — ABNORMAL HIGH (ref 8–23)
BUN: 27 mg/dL — ABNORMAL HIGH (ref 8–23)
BUN: 26 mg/dL — ABNORMAL HIGH (ref 8–23)
BUN: 27 mg/dL — ABNORMAL HIGH (ref 8–23)
Calcium, Ion: 1.25 mmol/L (ref 1.15–1.40)
Calcium, Ion: 1.28 mmol/L (ref 1.15–1.40)
Calcium, Ion: 1.21 mmol/L (ref 1.15–1.40)
Calcium, Ion: 1.3 mmol/L (ref 1.15–1.40)
Chloride: 103 mmol/L (ref 98–111)
Chloride: 104 mmol/L (ref 98–111)
Chloride: 104 mmol/L (ref 98–111)
Chloride: 102 mmol/L (ref 98–111)
Creatinine, Ser: 1 mg/dL (ref 0.61–1.24)
Creatinine, Ser: 1 mg/dL (ref 0.61–1.24)
Creatinine, Ser: 0.9 mg/dL (ref 0.61–1.24)
Creatinine, Ser: 1 mg/dL (ref 0.61–1.24)
Glucose, Bld: 104 mg/dL — ABNORMAL HIGH (ref 70–99)
Glucose, Bld: 129 mg/dL — ABNORMAL HIGH (ref 70–99)
Glucose, Bld: 145 mg/dL — ABNORMAL HIGH (ref 70–99)
Glucose, Bld: 127 mg/dL — ABNORMAL HIGH (ref 70–99)
HCT: 31 % — ABNORMAL LOW (ref 39.0–52.0)
HCT: 34 % — ABNORMAL LOW (ref 39.0–52.0)
HCT: 30 % — ABNORMAL LOW (ref 39.0–52.0)
HCT: 30 % — ABNORMAL LOW (ref 39.0–52.0)
Hemoglobin: 10.5 g/dL — ABNORMAL LOW (ref 13.0–17.0)
Hemoglobin: 11.6 g/dL — ABNORMAL LOW (ref 13.0–17.0)
Hemoglobin: 10.2 g/dL — ABNORMAL LOW (ref 13.0–17.0)
Hemoglobin: 10.2 g/dL — ABNORMAL LOW (ref 13.0–17.0)
Potassium: 3.8 mmol/L (ref 3.5–5.1)
Potassium: 3.9 mmol/L (ref 3.5–5.1)
Potassium: 3.8 mmol/L (ref 3.5–5.1)
Potassium: 4.1 mmol/L (ref 3.5–5.1)
Sodium: 142 mmol/L (ref 135–145)
Sodium: 142 mmol/L (ref 135–145)
Sodium: 141 mmol/L (ref 135–145)
Sodium: 141 mmol/L (ref 135–145)
TCO2: 27 mmol/L (ref 22–32)
TCO2: 28 mmol/L (ref 22–32)
TCO2: 26 mmol/L (ref 22–32)
TCO2: 27 mmol/L (ref 22–32)

## 2019-05-23 SURGERY — IMPLANTATION, AORTIC VALVE, TRANSCATHETER, FEMORAL APPROACH
Anesthesia: Monitor Anesthesia Care

## 2019-05-23 MED ORDER — CHLORHEXIDINE GLUCONATE 4 % EX LIQD: 60.0000 mL | Freq: Once | CUTANEOUS | Status: DC

## 2019-05-23 MED ORDER — SODIUM CHLORIDE 0.9% FLUSH
3.0000 mL | Freq: Two times a day (BID) | INTRAVENOUS | Status: DC
  Administered 2019-05-24 (×2): 3 mL via INTRAVENOUS

## 2019-05-23 MED ORDER — MUPIROCIN 2 % EX OINT
1.0000 "application " | TOPICAL_OINTMENT | Freq: Two times a day (BID) | CUTANEOUS | Status: DC
  Administered 2019-05-23 – 2019-05-25 (×4): 1 via NASAL
  Filled 2019-05-23: qty 22

## 2019-05-23 MED ORDER — SODIUM CHLORIDE 0.9 % IV SOLN
INTRAVENOUS | Status: AC
  Administered 2019-05-23: 50 mL/h via INTRAVENOUS

## 2019-05-23 MED ORDER — ACETAMINOPHEN 650 MG RE SUPP: 650.0000 mg | Freq: Four times a day (QID) | RECTAL | Status: DC | PRN

## 2019-05-23 MED ORDER — SODIUM CHLORIDE 0.9 % IV SOLN: INTRAVENOUS | Status: DC

## 2019-05-23 MED ORDER — PROTAMINE SULFATE 10 MG/ML IV SOLN
INTRAVENOUS | Status: DC | PRN
  Administered 2019-05-23: 10 mg via INTRAVENOUS
  Administered 2019-05-23: 80 mg via INTRAVENOUS

## 2019-05-23 MED ORDER — HEPARIN SODIUM (PORCINE) 1000 UNIT/ML IJ SOLN
INTRAMUSCULAR | Status: AC
  Filled 2019-05-23: qty 1

## 2019-05-23 MED ORDER — CHLORHEXIDINE GLUCONATE 0.12 % MT SOLN
15.0000 mL | Freq: Once | OROMUCOSAL | Status: AC
  Administered 2019-05-23: 15 mL via OROMUCOSAL
  Filled 2019-05-23: qty 15

## 2019-05-23 MED ORDER — PHENYLEPHRINE HCL-NACL 20-0.9 MG/250ML-% IV SOLN: 0.0000 ug/min | INTRAVENOUS | Status: DC

## 2019-05-23 MED ORDER — VANCOMYCIN HCL IN DEXTROSE 1-5 GM/200ML-% IV SOLN
1000.0000 mg | Freq: Once | INTRAVENOUS | Status: AC
  Administered 2019-05-23: 1000 mg via INTRAVENOUS
  Filled 2019-05-23: qty 200

## 2019-05-23 MED ORDER — SODIUM CHLORIDE 0.9% FLUSH: 3.0000 mL | INTRAVENOUS | Status: DC | PRN

## 2019-05-23 MED ORDER — SODIUM CHLORIDE 0.9 % IV SOLN: 250.0000 mL | INTRAVENOUS | Status: DC | PRN

## 2019-05-23 MED ORDER — ACETAMINOPHEN 325 MG PO TABS: 650.0000 mg | ORAL_TABLET | Freq: Four times a day (QID) | ORAL | Status: DC | PRN

## 2019-05-23 MED ORDER — SODIUM CHLORIDE 0.9 % IV SOLN
INTRAVENOUS | Status: DC | PRN
  Administered 2019-05-23: 09:00:00 via INTRAVENOUS

## 2019-05-23 MED ORDER — LACTATED RINGERS IV SOLN
INTRAVENOUS | Status: DC | PRN
  Administered 2019-05-23: 09:00:00 via INTRAVENOUS

## 2019-05-23 MED ORDER — MORPHINE SULFATE (PF) 2 MG/ML IV SOLN: 1.0000 mg | INTRAVENOUS | Status: DC | PRN

## 2019-05-23 MED ORDER — PROPOFOL 500 MG/50ML IV EMUL
INTRAVENOUS | Status: DC | PRN
  Administered 2019-05-23: 10 ug/kg/min via INTRAVENOUS

## 2019-05-23 MED ORDER — TRAMADOL HCL 50 MG PO TABS: 50.0000 mg | ORAL_TABLET | ORAL | Status: DC | PRN

## 2019-05-23 MED ORDER — SODIUM CHLORIDE 0.9 % IV SOLN
INTRAVENOUS | Status: DC | PRN
  Administered 2019-05-23: 10 ug/min via INTRAVENOUS

## 2019-05-23 MED ORDER — NITROGLYCERIN IN D5W 200-5 MCG/ML-% IV SOLN: 0.0000 ug/min | INTRAVENOUS | Status: DC

## 2019-05-23 MED ORDER — PROPOFOL 10 MG/ML IV BOLUS
INTRAVENOUS | Status: DC | PRN
  Administered 2019-05-23 (×2): 10 mg via INTRAVENOUS

## 2019-05-23 MED ORDER — HEPARIN (PORCINE) IN NACL 1000-0.9 UT/500ML-% IV SOLN
INTRAVENOUS | Status: DC | PRN
  Administered 2019-05-23 (×3): 500 mL

## 2019-05-23 MED ORDER — HEPARIN SODIUM (PORCINE) 1000 UNIT/ML IJ SOLN
INTRAMUSCULAR | Status: DC | PRN
  Administered 2019-05-23: 9000 [IU] via INTRAVENOUS

## 2019-05-23 MED ORDER — CLOPIDOGREL BISULFATE 75 MG PO TABS
75.0000 mg | ORAL_TABLET | Freq: Every day | ORAL | Status: DC
  Administered 2019-05-24 – 2019-05-25 (×2): 75 mg via ORAL
  Filled 2019-05-23 (×2): qty 1

## 2019-05-23 MED ORDER — MUPIROCIN 2 % EX OINT
TOPICAL_OINTMENT | Freq: Two times a day (BID) | CUTANEOUS | Status: DC
  Administered 2019-05-23: 1 via NASAL
  Filled 2019-05-23: qty 22

## 2019-05-23 MED ORDER — ONDANSETRON HCL 4 MG/2ML IJ SOLN
INTRAMUSCULAR | Status: DC | PRN
  Administered 2019-05-23: 4 mg via INTRAVENOUS

## 2019-05-23 MED ORDER — ASPIRIN EC 81 MG PO TBEC
81.0000 mg | DELAYED_RELEASE_TABLET | Freq: Every day | ORAL | Status: DC
  Administered 2019-05-23 – 2019-05-24 (×2): 81 mg via ORAL
  Filled 2019-05-23 (×2): qty 1

## 2019-05-23 MED ORDER — CHLORHEXIDINE GLUCONATE 4 % EX LIQD: 30.0000 mL | CUTANEOUS | Status: DC

## 2019-05-23 MED ORDER — ONDANSETRON HCL 4 MG/2ML IJ SOLN: 4.0000 mg | Freq: Four times a day (QID) | INTRAMUSCULAR | Status: DC | PRN

## 2019-05-23 MED ORDER — SODIUM CHLORIDE 0.9 % IV SOLN
1.5000 g | Freq: Two times a day (BID) | INTRAVENOUS | Status: AC
  Administered 2019-05-23 – 2019-05-25 (×4): 1.5 g via INTRAVENOUS
  Filled 2019-05-23 (×4): qty 1.5

## 2019-05-23 MED ORDER — CHLORHEXIDINE GLUCONATE CLOTH 2 % EX PADS: 6.0000 | MEDICATED_PAD | Freq: Every day | CUTANEOUS | Status: DC

## 2019-05-23 MED ORDER — LEVOTHYROXINE SODIUM 75 MCG PO TABS
75.0000 ug | ORAL_TABLET | Freq: Every day | ORAL | Status: DC
  Administered 2019-05-24 – 2019-05-25 (×2): 75 ug via ORAL
  Filled 2019-05-23 (×2): qty 1

## 2019-05-23 MED ORDER — DEXMEDETOMIDINE HCL IN NACL 400 MCG/100ML IV SOLN
INTRAVENOUS | Status: DC | PRN
  Administered 2019-05-23: 27.2 ug via INTRAVENOUS

## 2019-05-23 MED ORDER — IOHEXOL 350 MG/ML SOLN
INTRAVENOUS | Status: DC | PRN
  Administered 2019-05-23: 60 mL via INTRA_ARTERIAL

## 2019-05-23 MED ORDER — LIDOCAINE HCL (PF) 1 % IJ SOLN
INTRAMUSCULAR | Status: DC | PRN
  Administered 2019-05-23 (×2): 10 mL via SUBCUTANEOUS

## 2019-05-23 MED ORDER — OXYCODONE HCL 5 MG PO TABS: 5.0000 mg | ORAL_TABLET | ORAL | Status: DC | PRN

## 2019-05-23 SURGICAL SUPPLY — 39 items
BAG SNAP BAND KOVER 36X36 (MISCELLANEOUS) ×4 IMPLANT
BLANKET WARM UNDERBOD FULL ACC (MISCELLANEOUS) ×2 IMPLANT
CABLE ADAPT PACING TEMP 12FT (ADAPTER) ×1 IMPLANT
CATH 29 EDWARDS DELIVERY SYS (CATHETERS) ×1 IMPLANT
CATH DIAG 6FR PIGTAIL ANGLED (CATHETERS) ×2 IMPLANT
CATH INFINITI 6F AL2 (CATHETERS) ×1 IMPLANT
CATH S G BIP PACING (CATHETERS) ×1 IMPLANT
CLOSURE MYNX CONTROL 6F/7F (Vascular Products) ×1 IMPLANT
CRIMPER (MISCELLANEOUS) ×1 IMPLANT
DEVICE CLOSURE PERCLS PRGLD 6F (VASCULAR PRODUCTS) IMPLANT
DEVICE INFLATION ATRION QL38 (MISCELLANEOUS) ×1 IMPLANT
DRYSEAL FLEXSHEATH 18FR 33CM (SHEATH) ×1
ELECT DEFIB PAD ADLT CADENCE (PAD) ×1 IMPLANT
GUIDEWIRE SAFE TJ AMPLATZ EXST (WIRE) ×1 IMPLANT
KIT HEART LEFT (KITS) ×2 IMPLANT
KIT MICROPUNCTURE NIT STIFF (SHEATH) ×1 IMPLANT
PACK CARDIAC CATHETERIZATION (CUSTOM PROCEDURE TRAY) ×2 IMPLANT
PERCLOSE PROGLIDE 6F (VASCULAR PRODUCTS) ×4
SHEATH 16X36 EDWARDS (SHEATH) ×2 IMPLANT
SHEATH BRITE TIP 7FR 35CM (SHEATH) ×1 IMPLANT
SHEATH DRYSEAL FLEX 18FR 33CM (SHEATH) IMPLANT
SHEATH PINNACLE 6F 10CM (SHEATH) ×1 IMPLANT
SHEATH PINNACLE 8F 10CM (SHEATH) ×1 IMPLANT
SHEATH PROBE COVER 6X72 (BAG) ×1 IMPLANT
SHIELD RADPAD SCOOP 12X17 (MISCELLANEOUS) ×1 IMPLANT
SLEEVE REPOSITIONING LENGTH 30 (MISCELLANEOUS) ×1 IMPLANT
STOPCOCK MORSE 400PSI 3WAY (MISCELLANEOUS) ×4 IMPLANT
SYR MEDRAD MARK V 150ML (SYRINGE) ×1 IMPLANT
TRANSDUCER W/STOPCOCK (MISCELLANEOUS) ×4 IMPLANT
TUBE CONN 8.8X1320 FR HP M-F (CONNECTOR) ×1 IMPLANT
TUBING ART PRESS 72  MALE/FEM (TUBING) ×1
TUBING ART PRESS 72 MALE/FEM (TUBING) IMPLANT
VALVE HEART TRANSCATH SZ3 29MM (Valve) ×1 IMPLANT
WIRE AMPLATZ SS-J .035X180CM (WIRE) ×1 IMPLANT
WIRE EMERALD 3MM-J .035X150CM (WIRE) ×1 IMPLANT
WIRE EMERALD 3MM-J .035X260CM (WIRE) ×1 IMPLANT
WIRE EMERALD ST .035X260CM (WIRE) ×1 IMPLANT
WIRE HI TORQ VERSACORE-J 145CM (WIRE) ×1 IMPLANT
WIRE LUNDERQUIST .035X180CM (WIRE) ×1 IMPLANT

## 2019-05-23 NOTE — Op Note (Signed)
HEART AND VASCULAR CENTER   MULTIDISCIPLINARY HEART VALVE TEAM   TAVR OPERATIVE NOTE   Date of Procedure:  05/23/2019  Preoperative Diagnosis: Severe Aortic Stenosis   Postoperative Diagnosis: Same   Procedure:    Transcatheter Aortic Valve Replacement - Percutaneous Transfemoral Approach  Edwards Sapien 3 THV (size 26 mm, model # 9600TFX, serial # [id number])   Co-Surgeons:  Valentina Gu. Roxy Manns, MD and Sherren Mocha, MD  Anesthesiologist:  Albertha Ghee, MD  Echocardiographer:  Jenkins Rouge, MD  Pre-operative Echo Findings:  Severe aortic stenosis  Normal left ventricular systolic function  Post-operative Echo Findings:  No paravalvular leak  Normal left ventricular systolic function  BRIEF CLINICAL NOTE AND INDICATIONS FOR SURGERY  85 yo male with hx of CAD and prior CABG in 1991. He has developed progressive and now severe stage D1 aortic stenosis. He has undergone cardiac cath and CTA studies demonstrating continued patency of his bypass grafts and anatomy suitable for transfemoral TAVR.  During the course of the patient's preoperative work up they have been evaluated comprehensively by a multidisciplinary team of specialists coordinated through the [facility] in the [facility].  They have been demonstrated to suffer from symptomatic severe aortic stenosis as noted above. The patient has been counseled extensively as to the relative risks and benefits of all options for the treatment of severe aortic stenosis including long term medical therapy, conventional surgery for aortic valve replacement, and transcatheter aortic valve replacement.  The patient has been independently evaluated in formal cardiac surgical consultation by Dr Roxy Manns, who deemed the patient appropriate for TAVR. Based upon review of all of the patient's preoperative diagnostic tests they are felt to be candidate for transcatheter aortic valve replacement  using the transfemoral approach as an alternative to conventional surgery.    Following the decision to proceed with transcatheter aortic valve replacement, a discussion has been held regarding what types of management strategies would be attempted intraoperatively in the event of life-threatening complications, including whether or not the patient would be considered a candidate for the use of cardiopulmonary bypass and/or conversion to open sternotomy for attempted surgical intervention.  The patient has been advised of a variety of complications that might develop peculiar to this approach including but not limited to risks of death, stroke, paravalvular leak, aortic dissection or other major vascular complications, aortic annulus rupture, device embolization, cardiac rupture or perforation, acute myocardial infarction, arrhythmia, heart block or bradycardia requiring permanent pacemaker placement, congestive heart failure, respiratory failure, renal failure, pneumonia, infection, other late complications related to structural valve deterioration or migration, or other complications that might ultimately cause a temporary or permanent loss of functional independence or other long term morbidity.  The patient provides full informed consent for the procedure as described and all questions were answered preoperatively.  DETAILS OF THE OPERATIVE PROCEDURE  PREPARATION:   The patient is brought to the operating room on the above mentioned date and central monitoring was established by the anesthesia team including placement of a central venous catheter and radial arterial line. The patient is placed in the supine position on the operating table.  Intravenous antibiotics are administered. The patient is monitored closely throughout the procedure under conscious sedation.   Baseline transthoracic echocardiogram is performed. The patient's chest, abdomen, both groins, and both lower extremities are prepared and  draped in a sterile manner. A time out procedure is performed.   PERIPHERAL ACCESS:   Using ultrasound guidance, femoral arterial and  venous access is obtained with placement of 6 Fr sheaths on the left side.  A pigtail diagnostic catheter was passed through the femoral arterial sheath under fluoroscopic guidance into the aortic root.  A temporary transvenous pacemaker catheter was passed through the femoral venous sheath under fluoroscopic guidance into the right ventricle.  The pacemaker was tested to ensure stable lead placement and pacemaker capture. Aortic root angiography was performed in order to determine the optimal angiographic angle for valve deployment.  TRANSFEMORAL ACCESS:  A micropuncture technique is used to access the right femoral artery under fluoroscopic and ultrasound guidance.  2 Perclose devices are deployed at 10' and 2' positions to 'PreClose' the femoral artery. An 8 French sheath is placed and then an Amplatz Superstiff wire is advanced through the sheath. I was not able to advance the 16 Fr dilator through a calcified segment of the common femoral artery. The Superstiff wire is changed out for a Lunderquist wire, then the common iliac is dilated with an 18 Fr dilator. This facilitated passage of the 16 Fr E-sheath through into the aorta. An AL-2 catheter was used to direct a straight-tip exchange length wire across the native aortic valve into the left ventricle. This was exchanged out for a pigtail catheter and position was confirmed in the LV apex. Simultaneous LV and Ao pressures were recorded.  The pigtail catheter was exchanged for an Amplatz Extra-stiff wire in the LV apex.    BALLOON AORTIC VALVULOPLASTY:  Not performed  TRANSCATHETER HEART VALVE DEPLOYMENT:  An Edwards Sapien 3 transcatheter heart valve (size 26 mm) was prepared and crimped per manufacturer's guidelines, and the proper orientation of the valve is confirmed on the [employer] delivery system. The  valve was advanced through the introducer sheath using normal technique until in an appropriate position in the abdominal aorta beyond the sheath tip. The balloon was then retracted and using the fine-tuning wheel was centered on the valve. The valve was then advanced across the aortic arch using appropriate flexion of the catheter. The valve was carefully positioned across the aortic valve annulus. The Commander catheter was retracted using normal technique. Once final position of the valve has been confirmed by angiographic assessment, the valve is deployed while temporarily holding ventilation and during rapid ventricular pacing to maintain systolic blood pressure < 50 mmHg and pulse pressure < 10 mmHg. The balloon inflation is held for >3 seconds after reaching full deployment volume. Once the balloon has fully deflated the balloon is retracted into the ascending aorta and valve function is assessed using echocardiography. The patient's hemodynamic recovery following valve deployment is good.  The deployment balloon and guidewire are both removed. Echo demostrated acceptable post-procedural gradients, stable mitral valve function, and no aortic insufficiency.   PROCEDURE COMPLETION:  The sheath was removed and femoral artery closure is performed using the 2 previously deployed Perclose devices. A distal aortogram is performed with digital subtraction after pulling the sheath back into the external iliac artery.   Protamine is administered once femoral arterial repair was complete. The site is clear with no evidence of bleeding or hematoma after the sutures are tightened. The temporary pacemaker and pigtail catheters are removed. Mynx closure is used for left common femoral hemostasis. The patient tolerated the procedure well and is transported to the surgical intensive care in stable condition. There were no immediate intraoperative complications. All sponge instrument and needle counts are verified correct  at completion of the operation.   The patient received a total of  60 mL of intravenous contrast during the procedure.   Sherren Mocha, MD 05/23/2019 11:27 AM

## 2019-05-23 NOTE — Progress Notes (Signed)
Patient arrived to unit and oriented to room. CHG wipes applied. VSS.Call bell in reach. Will continue to monitor.   Arletta Bale, RN

## 2019-05-23 NOTE — Transfer of Care (Signed)
Immediate Anesthesia Transfer of Care Note  Patient: Raymond Swanson  Procedure(s) Performed: TRANSCATHETER AORTIC VALVE REPLACEMENT, TRANSFEMORAL (N/A ) TRANSESOPHAGEAL ECHOCARDIOGRAM (TEE) (N/A )  Patient Location: Cath Lab  Anesthesia Type:MAC  Level of Consciousness: sedated  Airway & Oxygen Therapy: Patient Spontanous Breathing and Patient connected to nasal cannula oxygen  Post-op Assessment: Report given to RN and Post -op Vital signs reviewed and stable  Post vital signs: Reviewed and stable  Last Vitals:  Vitals Value Taken Time  BP 92/34 05/23/19 1144  Temp    Pulse 52 05/23/19 1145  Resp 15 05/23/19 1145  SpO2 97 % 05/23/19 1145  Vitals shown include unvalidated device data.  Last Pain:  Vitals:   05/23/19 0911  TempSrc:   PainSc: 0-No pain         Complications: No apparent anesthesia complications

## 2019-05-23 NOTE — Op Note (Signed)
[facility]   MULTIDISCIPLINARY HEART VALVE TEAM   TAVR OPERATIVE NOTE   Date of Procedure:  05/23/2019  Preoperative Diagnosis: Severe Aortic Stenosis   Postoperative Diagnosis: Same   Procedure:    Transcatheter Aortic Valve Replacement - Percutaneous Right Transfemoral Approach  Edwards Sapien 3 THV (size 29 mm, model # 9600TFX, serial # [id number])   Co-Surgeons:  Valentina Gu. Roxy Manns, MD and Sherren Mocha, MD  Anesthesiologist:  Albertha Ghee, MD  Echocardiographer:  Jenkins Rouge, MD  Pre-operative Echo Findings:  Severe aortic stenosis  Normal left ventricular systolic function  Post-operative Echo Findings:  No paravalvular leak  Normal left ventricular systolic function   BRIEF CLINICAL NOTE AND INDICATIONS FOR SURGERY  Patient is a 85 year old male with history of aortic stenosis, coronary artery disease status post coronary artery bypass grafting in the remote past, hypertension, hyperlipidemia, hypothyroidism, and prostate cancer who has been referred for surgical consultation to discuss treatment options for management of severe symptomatic aortic stenosis.  Patient's cardiac history dates back to 1991 when he was found to have three-vessel coronary artery disease after having undergone routine exercise treadmill stress test.  He underwent coronary artery bypass grafting x3 by Dr. Redmond Pulling.  He has done well from a cardiac standpoint ever since and for several years has been followed carefully by Dr. Percival Spanish.  Echocardiograms have documented the presence of preserved left ventricular function with aortic stenosis that has gradually progressed in severity.  The patient was recently evaluated by Dr. Percival Spanish and reported to syncopal episodes.  He has a long history of exertional shortness of breath that has progressed substantially over the past year.  His appetite is been down and he has lost a fair amount of weight.  He describes to frank syncopal  episodes without any associated chest pain.  Follow-up transthoracic echocardiogram performed Jan 17, 2019 revealed normal left ventricular systolic function with severe aortic stenosis and moderate aortic insufficiency.  He was referred to the multidisciplinary heart valve clinic and has been evaluated previously by Dr. Burt Knack.  Diagnostic cardiac catheterization was performed April 28, 2019 and revealed severe native coronary artery disease with continued patency of bypass grafts in all 3 major vascular territories.  The aortic valve was not crossed.  There was moderate pulmonary hypertension.  CT angiography was performed and the patient referred for surgical consultation.  During the course of the patient's preoperative work up they have been evaluated comprehensively by a multidisciplinary team of specialists coordinated through the [facility] in the [facility].  They have been demonstrated to suffer from symptomatic severe aortic stenosis as noted above. The patient has been counseled extensively as to the relative risks and benefits of all options for the treatment of severe aortic stenosis including long term medical therapy, conventional surgery for aortic valve replacement, and transcatheter aortic valve replacement.  All questions have been answered, and the patient provides full informed consent for the operation as described.   DETAILS OF THE OPERATIVE PROCEDURE  PREPARATION:    The patient is brought to the operating room on the above mentioned date and appropriate monitoring was established by the anesthesia team. The patient is placed in the supine position on the operating table.  Intravenous antibiotics are administered. The patient is monitored closely throughout the procedure under conscious sedation.  Baseline transthoracic echocardiogram was performed. The patient's chest, abdomen, both groins, and both lower extremities are  prepared and draped in a sterile manner. A  time out procedure is performed.   PERIPHERAL ACCESS:    Using the modified Seldinger technique, femoral arterial and venous access was obtained with placement of 6 Fr sheaths on the left side.  A pigtail diagnostic catheter was passed through the left arterial sheath under fluoroscopic guidance into the aortic root.  A temporary transvenous pacemaker catheter was passed through the left femoral venous sheath under fluoroscopic guidance into the right ventricle.  The pacemaker was tested to ensure stable lead placement and pacemaker capture. Aortic root angiography was performed in order to determine the optimal angiographic angle for valve deployment.   TRANSFEMORAL ACCESS:   Percutaneous transfemoral access and sheath placement was performed using ultrasound guidance.  The right common femoral artery was cannulated using a micropuncture needle and appropriate location was verified using hand injection angiogram.  A pair of Abbott Perclose percutaneous closure devices were placed and a 6 French sheath replaced into the femoral artery.  The patient was heparinized systemically and ACT verified > 250 seconds.    A 16 Fr transfemoral E-sheath was introduced into the right common femoral artery after progressively dilating over a Lunderquist wire.  Pre-dilitation using an 18 Fr dilator was required to cross stenosis at the ostium of the right common iliac artery, but otherwise the E-sheath was inserted uneventfully.  An AL-2 catheter was used to direct a straight-tip exchange length wire across the native aortic valve into the left ventricle. This was exchanged out for a pigtail catheter and position was confirmed in the LV apex. Simultaneous LV and Ao pressures were recorded.  The pigtail catheter was exchanged for an Amplatz Extra-stiff wire in the LV apex.  Echocardiography was utilized to confirm appropriate wire position and no sign of entanglement in the  mitral subvalvular apparatus.   TRANSCATHETER HEART VALVE DEPLOYMENT:   An Edwards Sapien 3 Ultra transcatheter heart valve (size 29 mm, model #9600TFX, serial [id number]) was prepared and crimped per manufacturer's guidelines, and the proper orientation of the valve is confirmed on the [employer] delivery system. The valve was advanced through the introducer sheath using normal technique until in an appropriate position in the abdominal aorta beyond the sheath tip. The balloon was then retracted and using the fine-tuning wheel was centered on the valve. The valve was then advanced across the aortic arch using appropriate flexion of the catheter. The valve was carefully positioned across the aortic valve annulus. The Commander catheter was retracted using normal technique. Once final position of the valve has been confirmed by angiographic assessment, the valve is deployed while temporarily holding ventilation and during rapid ventricular pacing to maintain systolic blood pressure < 50 mmHg and pulse pressure < 10 mmHg. The balloon inflation is held for >3 seconds after reaching full deployment volume. Once the balloon has fully deflated the balloon is retracted into the ascending aorta and valve function is assessed using echocardiography. There is felt to be no paravalvular leak and no central aortic insufficiency.  The patient's hemodynamic recovery following valve deployment is good.  The deployment balloon and guidewire are both removed.    PROCEDURE COMPLETION:   The sheath was removed and femoral artery closure performed.  A digital subtraction angiogram of the aortic bifurcation was performed after the sheath was removed to reexamine the right common iliac artery, which appeared intact.  Protamine was administered once femoral arterial repair was complete. The temporary pacemaker, pigtail catheters and femoral sheaths were removed with manual pressure used for hemostasis.  A Mynx femoral  closure device was utilized following removal of the diagnostic sheath in the left femoral artery.  The patient tolerated the procedure well and is transported to the surgical intensive care in stable condition. There were no immediate intraoperative complications. All sponge instrument and needle counts are verified correct at completion of the operation.   No blood products were administered during the operation.  The patient received a total of 60 mL of intravenous contrast during the procedure.   Rexene Alberts, MD 05/23/2019 11:29 AM

## 2019-05-23 NOTE — Progress Notes (Signed)
  Echocardiogram 2D Echocardiogram has been performed.  Raymond Swanson 05/23/2019, 10:54 AM

## 2019-05-23 NOTE — Anesthesia Procedure Notes (Signed)
Procedure Name: MAC Date/Time: 05/23/2019 9:10 AM Performed by: Leonor Liv, CRNA Pre-anesthesia Checklist: Patient identified, Emergency Drugs available, Patient being monitored and Suction available Patient Re-evaluated:Patient Re-evaluated prior to induction Oxygen Delivery Method: Simple face mask Placement Confirmation: positive ETCO2 Dental Injury: Teeth and Oropharynx as per pre-operative assessment

## 2019-05-23 NOTE — Discharge Instructions (Signed)
ACTIVITY AND EXERCISE °• Daily activity and exercise are an important part of your recovery. People recover at different rates depending on their general health and type of valve procedure. °• Most people recovering from TAVR feel better relatively quickly  °• No lifting, pushing, pulling more than 10 pounds (examples to avoid: groceries, vacuuming, gardening, golfing): °            - For one week with a procedure through the groin. °            - For six weeks for procedures through the chest wall or neck. °NOTE: You will typically see one of our providers 7-14 days after your procedure to discuss WHEN TO RESUME the above activities.  °  °  °DRIVING °• Do not drive until you are seen for follow up and cleared by a provider. Generally, we ask patient to not drive for 1 week after their procedure. °• If you have been told by your doctor in the past that you may not drive, you must talk with him/her before you begin driving again. °  °DRESSING °• Groin site: you may leave the clear dressing over the site for up to one week or until it falls off. °  °HYGIENE °• If you had a femoral (leg) procedure, you may take a shower when you return home. After the shower, pat the site dry. Do NOT use powder, oils or lotions in your groin area until the site has completely healed. °• If you had a chest procedure, you may shower when you return home unless specifically instructed not to by your discharging practitioner. °            - DO NOT scrub incision; pat dry with a towel. °            - DO NOT apply any lotions, oils, powders to the incision. °            - No tub baths / swimming for at least 2 weeks. °• If you notice any fevers, chills, increased pain, swelling, bleeding or pus, please contact your doctor. °  °ADDITIONAL INFORMATION °• If you are going to have an upcoming dental procedure, please contact our office as you will require antibiotics ahead of time to prevent infection on your heart valve.  ° ° °If you have any  questions or concerns you can call the structural heart phone during normal business hours 8am-4pm. If you have an urgent need after hours or weekends please call [phone] to talk to the on call provider for general cardiology. If you have an emergency that requires immediate attention, please call 911.  ° ° °After TAVR Checklist ° °Check  Test Description  ° Follow up appointment in 1-2 weeks  You will see our structural heart physician assistant, Katie Kasmira Cacioppo. Your incision sites will be checked and you will be cleared to drive and resume all normal activities if you are doing well.    ° 1 month echo and follow up  You will have an echo to check on your new heart valve and be seen back in the office by Katie Brenya Taulbee. Many times the echo is not read by your appointment time, but Katie will call you later that day or the following day to report your results.  ° Follow up with your primary cardiologist You will need to be seen by your primary cardiologist in the following 3-6 months after your 1 month appointment in the valve   clinic. Often times your Plavix or Aspirin will be discontinued during this time, but this is decided on a case by case basis.   ° 1 year echo and follow up You will have another echo to check on your heart valve after 1 year and be seen back in the office by Katie Lexie Koehl. This your last structural heart visit.  ° Bacterial endocarditis prophylaxis  You will have to take antibiotics for the rest of your life before all dental procedures (even teeth cleanings) to protect your heart valve. Antibiotics are also required before some surgeries. Please check with your cardiologist before scheduling any surgeries. Also, please make sure to tell us if you have a penicillin allergy as you will require an alternative antibiotic.   ° ° °

## 2019-05-23 NOTE — Interval H&P Note (Signed)
History and Physical Interval Note:  05/23/2019 9:27 AM  Raymond Swanson  has presented today for surgery, with the diagnosis of Severe aortic stenosis.  The various methods of treatment have been discussed with the patient and family. After consideration of risks, benefits and other options for treatment, the patient has consented to  Procedure(s): TRANSCATHETER AORTIC VALVE REPLACEMENT, TRANSFEMORAL (N/A) TRANSESOPHAGEAL ECHOCARDIOGRAM (TEE) (N/A) as a surgical intervention.  The patient's history has been reviewed, patient examined, no change in status, stable for surgery.  I have reviewed the patient's chart and labs.  Questions were answered to the patient's satisfaction.     Sherren Mocha

## 2019-05-23 NOTE — Anesthesia Procedure Notes (Signed)
Arterial Line Insertion Start/End9/29/2020 8:45 AM, 05/23/2019 9:05 AM Performed by: Leonor Liv, CRNA, CRNA  Patient location: Pre-op. Preanesthetic checklist: patient identified, IV checked, site marked, risks and benefits discussed, surgical consent, monitors and equipment checked, pre-op evaluation, timeout performed and anesthesia consent Lidocaine 1% used for infiltration Right, radial was placed Catheter size: 20 G Hand hygiene performed , maximum sterile barriers used  and Seldinger technique used Allen's test indicative of satisfactory collateral circulation Attempts: 1 Procedure performed without using ultrasound guided technique. Following insertion, dressing applied and Biopatch. Post procedure assessment: normal  Patient tolerated the procedure well with no immediate complications.

## 2019-05-24 ENCOUNTER — Inpatient Hospital Stay (HOSPITAL_COMMUNITY): Payer: Medicare Other

## 2019-05-24 ENCOUNTER — Encounter (HOSPITAL_COMMUNITY): Payer: Self-pay | Admitting: Cardiovascular Disease

## 2019-05-24 DIAGNOSIS — Z952 Presence of prosthetic heart valve: Secondary | ICD-10-CM

## 2019-05-24 DIAGNOSIS — I5033 Acute on chronic diastolic (congestive) heart failure: Secondary | ICD-10-CM

## 2019-05-24 LAB — BASIC METABOLIC PANEL
Anion gap: 8 (ref 5–15)
BUN: 23 mg/dL (ref 8–23)
CO2: 27 mmol/L (ref 22–32)
Calcium: 8.4 mg/dL — ABNORMAL LOW (ref 8.9–10.3)
Chloride: 104 mmol/L (ref 98–111)
Creatinine, Ser: 1.14 mg/dL (ref 0.61–1.24)
GFR calc Af Amer: >60 mL/min (ref >60)
GFR calc non Af Amer: 56 mL/min — ABNORMAL LOW (ref >60)
Glucose, Bld: 122 mg/dL — ABNORMAL HIGH (ref 70–99)
Potassium: 4.4 mmol/L (ref 3.5–5.1)
Sodium: 139 mmol/L (ref 135–145)

## 2019-05-24 LAB — CBC
HCT: 33.3 % — ABNORMAL LOW (ref 39.0–52.0)
Hemoglobin: 9.8 g/dL — ABNORMAL LOW (ref 13.0–17.0)
MCH: 25.5 pg — ABNORMAL LOW (ref 26.0–34.0)
MCHC: 29.4 g/dL — ABNORMAL LOW (ref 30.0–36.0)
MCV: 86.5 fL (ref 80.0–100.0)
Platelets: 205 10*3/uL (ref 150–400)
RBC: 3.85 MIL/uL — ABNORMAL LOW (ref 4.22–5.81)
RDW: 19.9 % — ABNORMAL HIGH (ref 11.5–15.5)
WBC: 9.4 10*3/uL (ref 4.0–10.5)
nRBC: 0 % (ref 0.0–0.2)

## 2019-05-24 LAB — ECHOCARDIOGRAM COMPLETE
Height: 68 in
Weight: 1897.72 oz

## 2019-05-24 LAB — MAGNESIUM: Magnesium: 2 mg/dL (ref 1.7–2.4)

## 2019-05-24 MED ORDER — CLOPIDOGREL BISULFATE 75 MG PO TABS: 75.0000 mg | ORAL_TABLET | Freq: Every day | ORAL | 1 refills | Status: DC

## 2019-05-24 MED ORDER — POTASSIUM CHLORIDE CRYS ER 10 MEQ PO TBCR
10.0000 meq | EXTENDED_RELEASE_TABLET | Freq: Once | ORAL | Status: AC
  Administered 2019-05-24: 10 meq via ORAL
  Filled 2019-05-24: qty 1

## 2019-05-24 MED ORDER — FUROSEMIDE 10 MG/ML IJ SOLN
20.0000 mg | Freq: Once | INTRAMUSCULAR | Status: AC
  Administered 2019-05-24: 20 mg via INTRAVENOUS
  Filled 2019-05-24: qty 2

## 2019-05-24 NOTE — Progress Notes (Signed)
Pt ambulated in hallway about 258ft with walker. Tolerated fair. O2 92% on 3L. Returned to the bed. Call light in reach.

## 2019-05-24 NOTE — Progress Notes (Addendum)
[place] VALVE TEAM  Patient Name: Raymond Swanson Date of Encounter: 05/24/2019  Primary Cardiologist: Minus Breeding, MD / Dr. Burt Knack & Dr. Roxy Manns (TAVR)  Hospital Problem List     Principal Problem:   S/P TAVR (transcatheter aortic valve replacement) Active Problems:   Hypothyroidism   Dyslipidemia   ANEMIA   Essential hypertension   Hx of CABG   PROSTATE CANCER, HX OF   Malignant neoplasm of posterior wall of urinary bladder (HCC)   Severe aortic stenosis   NSVT (nonsustained ventricular tachycardia) ([place])     Subjective   Patient eager to go home. Sitting up in chair and states he is hungry. No other complaints. Cardiac rehab walked with him and reported weakness and mild 02 desaturations.   Inpatient Medications    Scheduled Meds: . aspirin EC  81 mg Oral QHS  . Chlorhexidine Gluconate Cloth  6 each Topical Daily  . clopidogrel  75 mg Oral Q breakfast  . levothyroxine  75 mcg Oral QAC breakfast  . mupirocin ointment  1 application Nasal BID  . sodium chloride flush  3 mL Intravenous Q12H   Continuous Infusions: . sodium chloride    . cefUROXime (ZINACEF)  IV 1.5 g (05/24/19 1010)  . nitroGLYCERIN     PRN Meds: sodium chloride, acetaminophen **OR** acetaminophen, morphine injection, ondansetron (ZOFRAN) IV, oxyCODONE, sodium chloride flush, traMADol   Vital Signs    Vitals:   05/24/19 0018 05/24/19 0305 05/24/19 0500 05/24/19 0742  BP: (!) 120/48 (!) 113/52  (!) 108/56  Pulse: 77 82  85  Resp: (!) 22 18  (!) 22  Temp: 97.8 F (36.6 C) 98.8 F (37.1 C)  98.1 F (36.7 C)  TempSrc: Oral Oral  Oral  SpO2: 98% 97%  90%  Weight:   53.8 kg   Height:        Intake/Output Summary (Last 24 hours) at 05/24/2019 1120 Last data filed at 05/24/2019 0600 Gross per 24 hour  Intake 700 ml  Output 575 ml  Net 125 ml   Filed Weights   05/23/19 0737 05/24/19 0500  Weight: 54.4 kg 53.8 kg    Physical Exam   GEN:  elderly and frail appearing, very HOH HEENT: normal Neck: mild JVD . No masses Cardiac: RRR; no murmurs, rubs, or gallops,no edema  Respiratory:  clear to auscultation bilaterally, breathing sounds congested without ausculation  GI: soft, nontender, nondistended, + BS MS: no deformity or atrophy Skin: warm and dry, no rash.  Groin sites clear without hematoma or ecchymosis  Neuro:  Alert and Oriented x 3, Strength and sensation are intact Psych: euthymic mood, full affect  Labs    CBC Recent Labs    05/23/19 1157 05/24/19 0355  WBC  --  9.4  HGB 10.2* 9.8*  HCT 30.0* 33.3*  MCV  --  86.5  PLT  --  99991111   Basic Metabolic Panel Recent Labs    05/23/19 1157 05/24/19 0355  NA 141 139  K 4.1 4.4  CL 102 104  CO2  --  27  GLUCOSE 127* 122*  BUN 27* 23  CREATININE 1.00 1.14  CALCIUM  --  8.4*  MG  --  2.0   Liver Function Tests No results for input(s): AST, ALT, ALKPHOS, BILITOT, PROT, ALBUMIN in the last 72 hours. No results for input(s): LIPASE, AMYLASE in the last 72 hours. Cardiac Enzymes No results for input(s): CKTOTAL, CKMB, CKMBINDEX, TROPONINI in the  last 72 hours. BNP Invalid input(s): POCBNP D-Dimer No results for input(s): DDIMER in the last 72 hours. Hemoglobin A1C No results for input(s): HGBA1C in the last 72 hours. Fasting Lipid Panel No results for input(s): CHOL, HDL, LDLCALC, TRIG, CHOLHDL, LDLDIRECT in the last 72 hours. Thyroid Function Tests No results for input(s): TSH, T4TOTAL, T3FREE, THYROIDAB in the last 72 hours.  Invalid input(s): FREET3  Telemetry    Sinus with PACs  - Personally Reviewed  ECG    Sinus with non specific ST/TW abnormality, HR 77  - Personally Reviewed  Radiology    Dg Chest Port 1 View  Result Date: 05/23/2019 CLINICAL DATA:  Status post transcatheter aortic valve replacement. EXAM: PORTABLE CHEST 1 VIEW COMPARISON:  Single-view of the chest 11/16/2017. PA and lateral chest 05/19/2019. FINDINGS: Mild bibasilar  atelectasis is present. New aortic valve is in place. The patient is status post CABG. Lungs are emphysematous. No pneumothorax. Atherosclerosis is seen. Multiple remote left rib fractures noted. IMPRESSION: No acute disease.  Mild bibasilar atelectasis is noted. Emphysema and atherosclerosis. Electronically Signed   By: Inge Rise M.D.   On: 05/23/2019 15:09    Cardiac Studies   TAVR OPERATIVE NOTE   Date of Procedure:05/23/2019  Preoperative Diagnosis:Severe Aortic Stenosis   Postoperative Diagnosis:Same   Procedure:   Transcatheter Aortic Valve Replacement - PercutaneousRightTransfemoral Approach Edwards Sapien 3 THV (size 59mm, model # 9600TFX, serial [id number])  Co-Surgeons:Clarence H. Roxy Manns, MD and Sherren Mocha, MD  Anesthesiologist:Adam Marcie Bal, MD  Echocardiographer:Peter Johnsie Cancel, MD  Pre-operative Echo Findings: ? Severe aortic stenosis ? Normalleft ventricular systolic function  Post-operative Echo Findings: ? Noparavalvular leak ? Normalleft ventricular systolic function  _____________   Echo 05/24/19: pending  Patient Profile     Raymond Swanson is a 85 y.o. male with a history of HTN, HLD, NSVT, CAD s/p CABGx3V ([place] 1991), CKD stage III, hx of prostate cancer, bladder cancer s/p TURBT, previous traumatic SHD, syncope x2 in May and severe AS who presented to [facility] on 05/23/19 for planned TAVR.  Assessment & Plan    Severe AS:s/p successful TAVR with a 29 mm Edwards Sapien 3 THV via the TF approach on 05/23/19. Post operative echo to be done today. Groin sites are stable. ECG with sinus and no HAVB. Continue Asprin and started on plavix 75mg  daily. Cardiac rehab walked with him and noted weakness. Will order a PT consult now. Also having some desaturations with walking. Will send home with 02 if neccessary.   HTN: BP well  controlled currently.   Acute on chronic diastolic CHF: as evidenced by an elevated BNP on pre admission lab work. He has some mild volume overload (mild JVD and congestion on lung exam). I gave him one dose of IV lasix 20mg  today.   CAD s/p CABGx3V: pre TAVR cath showed severe multivessel CAD with continued patency of the LIMA-LAD, SVG-OM, and SVG-RCA. Continue medical therapy.   Mable Fill, PA-C  05/24/2019, 11:20 AM  Pager [phone]  Patient seen, examined. Available data reviewed. Agree with findings, assessment, and plan as outlined by Nell Range, PA-C.  The patient is independently interviewed and examined.  He is an elderly gentleman sitting up in a chair.  JVP is normal, HEENT is normal, lung fields demonstrate bibasilar rales, heart is regular rate and rhythm with a soft ejection murmur at the right upper sternal border, abdomen is soft and nontender, bilateral groin sites are clear, lower extremities have no edema.  Telemetry is reviewed and  shows no evidence of heart block or sustained arrhythmia.  Agree with plans as outlined above.  The patient has acute on chronic diastolic heart failure to be treated with IV furosemide this morning.  We will check postoperative echocardiogram per protocol.  Sherren Mocha, M.D. 05/24/2019 12:08 PM

## 2019-05-24 NOTE — Progress Notes (Signed)
Echocardiogram 2D Echocardiogram has been performed.  Raymond Swanson 05/24/2019, 11:55 AM

## 2019-05-24 NOTE — Progress Notes (Signed)
SATURATION QUALIFICATIONS: (This note is used to comply with regulatory documentation for home oxygen)  Patient Saturations on Room Air at Rest = 87%  Patient Saturations on Room Air while Ambulating = 87%  Patient Saturations on 2 Liters of oxygen while Ambulating = 90%  Please briefly explain why patient needs home oxygen: pt desat on RA walking

## 2019-05-24 NOTE — Discharge Summary (Deleted)
Entered in error. Patient will not be discharged today.

## 2019-05-24 NOTE — Progress Notes (Addendum)
CARDIAC REHAB PHASE I   PRE:  Rate/Rhythm: 79 SR  BP:  Supine:   Sitting: 122/57  Standing:    SaO2: 99% 2L   87%RA  MODE:  Ambulation: 120 ft   POST:  Rate/Rhythm: 108 ST  BP:  Supine:   Sitting: 156/61  Standing:    SaO2: 90% 2L 1020-1050 Pt walked 120 ft on 2L with gait belt use, rolling walker and asst x 2. Needed help steering walker. Generalized weakness. Wheezing upon return to room. Kept on 2L to keep sats up. Chair alarm on. Would recommend PT to see due to weakness. Put in qualifying note for home oxygen if needed.   Graylon Good, RN BSN  05/24/2019 10:45 AM

## 2019-05-25 LAB — BASIC METABOLIC PANEL
Anion gap: 8 (ref 5–15)
BUN: 29 mg/dL — ABNORMAL HIGH (ref 8–23)
CO2: 30 mmol/L (ref 22–32)
Calcium: 8.4 mg/dL — ABNORMAL LOW (ref 8.9–10.3)
Chloride: 103 mmol/L (ref 98–111)
Creatinine, Ser: 1.27 mg/dL — ABNORMAL HIGH (ref 0.61–1.24)
GFR calc Af Amer: 56 mL/min — ABNORMAL LOW (ref >60)
GFR calc non Af Amer: 49 mL/min — ABNORMAL LOW (ref >60)
Glucose, Bld: 108 mg/dL — ABNORMAL HIGH (ref 70–99)
Potassium: 4.4 mmol/L (ref 3.5–5.1)
Sodium: 141 mmol/L (ref 135–145)

## 2019-05-25 LAB — CBC
HCT: 32.2 % — ABNORMAL LOW (ref 39.0–52.0)
Hemoglobin: 9.8 g/dL — ABNORMAL LOW (ref 13.0–17.0)
MCH: 25.9 pg — ABNORMAL LOW (ref 26.0–34.0)
MCHC: 30.4 g/dL (ref 30.0–36.0)
MCV: 85.2 fL (ref 80.0–100.0)
Platelets: 172 10*3/uL (ref 150–400)
RBC: 3.78 MIL/uL — ABNORMAL LOW (ref 4.22–5.81)
RDW: 19.9 % — ABNORMAL HIGH (ref 11.5–15.5)
WBC: 9.2 10*3/uL (ref 4.0–10.5)
nRBC: 0 % (ref 0.0–0.2)

## 2019-05-25 NOTE — Evaluation (Signed)
Physical Therapy Evaluation Patient Details Name: Raymond Swanson MRN: [id number] DOB: 07-06-1925 Today's Date: 05/25/2019   History of Present Illness  Patient is a 85 year old male with history of aortic stenosis, coronary artery disease status post coronary artery bypass grafting in the remote past, hypertension, hyperlipidemia, hypothyroidism, and prostate cancer admitted with severe symptomatic aortic stenosis now s/p TAVR on 05/23/19.  Clinical Impression  Patient presents with decreased balance, decreased strength and limited activity tolerance now needing O2 for ambulation.  He will benefit from skilled PT in the acute setting and follow up HHPT at d/c.  Currently min A to S level overall.  Lives with his wife and has a live in aide.  Feel stable for home with assist.     Follow Up Recommendations Home health PT;Supervision/Assistance - 24 hour    Equipment Recommendations  None recommended by PT    Recommendations for Other Services       Precautions / Restrictions Precautions Precautions: Fall Precaution Comments: watch SpO2 Restrictions Weight Bearing Restrictions: No      Mobility  Bed Mobility Overal bed mobility: Needs Assistance Bed Mobility: Supine to Sit     Supine to sit: Min assist     General bed mobility comments: increased time to scoot to EOB assist to lift trunk  Transfers Overall transfer level: Needs assistance Equipment used: Rolling walker (2 wheeled) Transfers: Sit to/from Stand Sit to Stand: Min guard         General transfer comment: from EOB, min A from chair without armrests  Ambulation/Gait Ambulation/Gait assistance: Supervision;Min guard;Min assist Gait Distance (Feet): 200 Feet(&100') Assistive device: Rolling walker (2 wheeled) Gait Pattern/deviations: Step-through pattern;Shuffle;Decreased stride length;Trunk flexed     General Gait Details: assist at times to maneuver walker around obstacles in hallway on straight path no  LOB but at times trunk more flexed  Stairs            Wheelchair Mobility    Modified Rankin (Stroke Patients Only)       Balance Overall balance assessment: Needs assistance   Sitting balance-Leahy Scale: Good     Standing balance support: Bilateral upper extremity supported;Single extremity supported Standing balance-Leahy Scale: Poor Standing balance comment: UE support needed                             Pertinent Vitals/Pain Pain Assessment: No/denies pain    Home Living Family/patient expects to be discharged to:: Private residence Living Arrangements: Spouse/significant other Available Help at Discharge: Family;Personal care attendant;Available 24 hours/day Type of Home: House Home Access: Stairs to enter Entrance Stairs-Rails: Right Entrance Stairs-Number of Steps: 2 Home Layout: One level Home Equipment: Walker - 2 wheels;Cane - single point;Bedside commode      Prior Function Level of Independence: Needs assistance   Gait / Transfers Assistance Needed: walked with walker at home  ADL's / Homemaking Assistance Needed: aide helps with some ADL's        Hand Dominance        Extremity/Trunk Assessment        Lower Extremity Assessment Lower Extremity Assessment: Generalized weakness    Cervical / Trunk Assessment Cervical / Trunk Assessment: Kyphotic  Communication   Communication: HOH  Cognition Arousal/Alertness: Awake/alert Behavior During Therapy: WFL for tasks assessed/performed Overall Cognitive Status: Within Functional Limits for tasks assessed  General Comments General comments (skin integrity, edema, etc.): SpO2 90-85% ambulating on RA, 94-89% on 1L with ambulation    Exercises     Assessment/Plan    PT Assessment Patient needs continued PT services  PT Problem List Decreased strength;Decreased mobility;Decreased activity tolerance;Decreased  balance;Decreased safety awareness       PT Treatment Interventions DME instruction;Stair training;Therapeutic activities;Balance training;Therapeutic exercise;Functional mobility training;Gait training;Patient/family education    PT Goals (Current goals can be found in the Care Plan section)  Acute Rehab PT Goals Patient Stated Goal: to go home today PT Goal Formulation: With patient Time For Goal Achievement: 06/01/19 Potential to Achieve Goals: Good    Frequency Min 3X/week   Barriers to discharge        Co-evaluation               AM-PAC PT "6 Clicks" Mobility  Outcome Measure Help needed turning from your back to your side while in a flat bed without using bedrails?: A Little Help needed moving from lying on your back to sitting on the side of a flat bed without using bedrails?: A Little Help needed moving to and from a bed to a chair (including a wheelchair)?: A Little Help needed standing up from a chair using your arms (e.g., wheelchair or bedside chair)?: A Little Help needed to walk in hospital room?: A Little Help needed climbing 3-5 steps with a railing? : A Little 6 Click Score: 18    End of Session Equipment Utilized During Treatment: Oxygen Activity Tolerance: Patient tolerated treatment well Patient left: in chair;with call bell/phone within reach   PT Visit Diagnosis: Muscle weakness (generalized) (M62.81);Other abnormalities of gait and mobility (R26.89)    Time: [id number] PT Time Calculation (min) (ACUTE ONLY): 35 min   Charges:   PT Evaluation $PT Eval Moderate Complexity: 1 Mod PT Treatments $Gait Training: 8-22 mins        Raymond Swanson, [place] [facility] [phone] 05/25/2019   Reginia Naas 05/25/2019, 10:36 AM

## 2019-05-25 NOTE — Progress Notes (Signed)
Pt provided discharge instructions and education. IV removed and intact. Telebox removed/ccmd notified. Vitals stable. Pt denies any complaints. Home o2 at bedside. Pt has all belongings. Pt tx via wheelchair to meet ride.  Jerald Kief, RN

## 2019-05-25 NOTE — Progress Notes (Signed)
SATURATION QUALIFICATIONS: (This note is used to comply with regulatory documentation for home oxygen)  Patient Saturations on Room Air at Rest = 90%  Patient Saturations on Room Air while Ambulating = 85%  Patient Saturations on 1 Liters of oxygen while Ambulating = 94%  Please briefly explain why patient needs home oxygen:  Patient hypoxic on RA with ambulation and unable to recover SpO2 with pursed lip breathing.    Magda Kiel, [place] [phone] 05/25/2019

## 2019-05-25 NOTE — TOC Transition Note (Signed)
Transition of Care [facility]) - CM/SW Discharge Note Marvetta Gibbons RN, BSN Transitions of Care Unit 4E- RN Case Manager [phone]   Patient Details  Name: Raymond Swanson MRN: [id number] Date of Birth: 1925/03/11  Transition of Care [facility]) CM/SW Contact:  Dawayne Patricia, RN Phone Number: 05/25/2019, 11:57 AM   Clinical Narrative:    Pt admitted s/p TAVR, stable for transition home today, orders placed for home 02 DME and HHPT, CM spoke with pt at bedside regarding transition of care needs and recommendations. Pt reluctant to agree to home 02- however did agree- states he has had it in the past- call made to [facility] with [employer] for home 02 needs- portable tank to be delivered to room prior to discharge. Also discussed HHPT recommendation however pt is not agreeable to [facility] services at this time- states he does not feel it would be helpful. List provided to pt should he change his mind when he gets home- he agrees he will call his doctor if he changes his mind when he gets home. Pt declines any HH referral upon discharge.    Final next level of care: [place] Barriers to Discharge: No Barriers Identified   Patient Goals and CMS Choice Patient states their goals for this hospitalization and ongoing recovery are:: return home CMS Medicare.gov Compare Post Acute Care list provided to:: Patient Choice offered to / list presented to : Patient  Discharge Placement           Home             Discharge Plan and Services   Discharge Planning Services: CM Consult Post Acute Care Choice: Durable Medical Equipment, Home Health          DME Arranged: Oxygen DME Agency: [employer] Date DME Agency Contacted: 05/25/19 Time DME Agency Contacted: 75 Representative spoke with at DME Agency: [facility] HH Arranged: PT, Patient Refused [place] Agency: NA        Social Determinants of Health ([place]) Interventions     Readmission Risk Interventions Readmission Risk Prevention  Plan 05/25/2019  Transportation Screening Complete  PCP or Specialist Appt within 5-7 Days Complete  Home Care Screening Complete  Medication Review (RN CM) Complete  Some recent data might be hidden

## 2019-05-25 NOTE — Discharge Summary (Addendum)
[facility] VALVE TEAM  Discharge Summary    Patient ID: Raymond Swanson MRN: [id number]; DOB: [birth year]  Admit date: 05/23/2019 Discharge date: 05/25/2019  Primary Care Provider: Isaac Swanson, Raymond Halsted, MD  Primary Cardiologist: Raymond Breeding, MD / Raymond Swanson & Raymond Swanson (TAVR)  Discharge Diagnoses    Principal Problem:   S/P TAVR (transcatheter aortic valve replacement) Active Problems:   Hypothyroidism   Dyslipidemia   ANEMIA   Essential hypertension   Hx of CABG   PROSTATE CANCER, HX OF   Malignant neoplasm of posterior wall of urinary bladder (HCC)   Severe aortic stenosis   NSVT (nonsustained ventricular tachycardia) (HCC)   Acute on chronic diastolic heart failure (HCC)   Allergies No Known Allergies  Diagnostic Studies/Procedures    TAVR OPERATIVE NOTE   Date of Procedure:05/23/2019  Preoperative Diagnosis:Severe Aortic Stenosis   Postoperative Diagnosis:Same   Procedure:   Transcatheter Aortic Valve Replacement - PercutaneousRightTransfemoral Approach Edwards Sapien 3 THV (size 35mm, model # 9600TFX, serial [id number])  Co-Surgeons:Raymond H. Raymond Manns, MD and Raymond Mocha, MD  Anesthesiologist:Raymond Marcie Bal, MD  Echocardiographer:Raymond Johnsie Cancel, MD  Pre-operative Echo Findings: ? Severe aortic stenosis ? Normalleft ventricular systolic function  Post-operative Echo Findings: ? Noparavalvular leak ? Normalleft ventricular systolic function  _____________  Echo9/30/20: IMPRESSIONS  1. Left ventricular ejection fraction, by visual estimation, is 55 to 60%. The left ventricle has normal function. Normal left ventricular size. There is mildly increased left ventricular hypertrophy.  2. Left ventricular diastolic Doppler parameters are consistent with impaired relaxation pattern  of LV diastolic filling.  3. Bioprosthetic aortic valve s/p TAVR with 29 mm Edwards Sapien 3. No peri-valvular regurgitation noted. Mean gradient 6 mmHg with calculated AVA 2.3 cm^2.  4. Mild mitral annular calcification.  5. The mitral valve is normal in structure. No evidence of mitral valve regurgitation. No evidence of mitral stenosis.  6. Left atrial size was mildly dilated.  7. Right atrial size was normal.  8. Global right ventricle has normal systolic function.The right ventricular size is normal. No increase in right ventricular wall thickness.  9. The tricuspid valve is normal in structure. Tricuspid valve regurgitation is trivial. 10. The inferior vena cava is dilated in size with >50% respiratory variability, suggesting right atrial pressure of 8 mmHg. 11. The tricuspid regurgitant velocity is 3.34 m/s, and with an assumed right atrial pressure of 8 mmHg, the estimated right ventricular systolic pressure is moderately elevated at 52.6 mmHg.    History of Present Illness     Raymond Swanson a 85 y.o.malewith a history of HTN, HLD, NSVT, CAD s/p CABGx3V (Raymond Swanson 1991), CKD stage III, hx of prostate cancer, bladder cancer s/p TURBT, previous traumatic SHD, syncope x2 in May and severe ASwho presented to [facility] for planned TAVR.  Patient's cardiac history dates back to 1991 when he was found to have three-vessel coronary artery disease after having undergone routine exercise treadmill stress test. He underwent coronary artery bypass grafting x3 by Raymond Swanson. He has done well from a cardiac standpoint ever since and for several years has been followed carefully by Raymond Swanson. Echocardiograms have documented the presence of preserved left ventricular function with aortic stenosis that has gradually progressed in severity. The patient was recentlyevaluated by Raymond Swanson and reported to syncopal episodes. He has a long history of exertional shortness of breath that has  progressed substantially over the past year. His appetite is been down and he has lost  a fair amount of weight. He describes to frank syncopal episodes without any associated chest pain. Follow-up transthoracic echocardiogram performed Jan 17, 2019 revealed normal left ventricular systolic function with severe aortic stenosis and moderate aortic insufficiency. He was referred to the multidisciplinary heart valve clinic and has been evaluated previously by Raymond Swanson. Diagnostic cardiac catheterization was performed April 28, 2019 and revealed severe native coronary artery disease with continued patency of bypass grafts in all 3 major vascular territories.  The patient has been evaluated by the multidisciplinary valve team and felt to have severe, symptomatic aortic stenosis and to be a suitable candidate for TAVR, which was set up for9/29/20.  Hospital Course     Consultants: none   Severe AS:s/p successful TAVR with a45mm Edwards Sapien 3 THV via the TF approach on 05/23/19. Post operative echo showed EF 55-60%, normally functioning TAVR with with a mean gradient of 6 mm Hg and no PVL. Groin sites are stable. ECG withsinus and no HAVB.Continue Asprin and started on plavix 75mg  daily. Also noted to have ambulatory desaturations and home 02 recommended.   Weakness: PT consulted who recommended home health PT, which we will order at discharge.   HTN: BP elevated. Will resume home Coreg 3.125mg  BID.  Acute on chronic diastolic CHF: as evidenced by an elevated BNP on pre admission lab work. He has some mild volume overload and treated with one dose of IV lasix. Creat bumped a little today (0.14--> 1.27). Plan to resume his home lasix 20mg  daily tomorrow.   CAD s/p CABGx3V: pre TAVR cath showedsevere multivessel CAD with continued patency of the LIMA-LAD, SVG-OM, and SVG-RCA.Continue medical therapy.  Hx of bladder CA: pre TAVR scan small enhancing areas in the urinary bladder  posteriorly concerning for recurrent urothelial neoplasmas well as anenhancing exophytic lesion measuring 1.3 cm in the posterior aspect of the lower pole of the right kidney, concerning for small renal cell carcinoma. He had a recent UTI with gross hematuria last week treated by Raymond Swanson. Repeat UA showed resolution of bacteruria but persistent hematuria. I discussed CT findings with Raymond Swanson over the phone and he is aware.  _____________  Discharge Vitals Blood pressure (!) 148/71, pulse 98, temperature 98.3 F (36.8 C), temperature source Oral, resp. rate 18, height 5\' 8"  (1.727 m), weight 53.7 kg, SpO2 95 %.  Filed Weights   05/23/19 0737 05/24/19 0500 05/25/19 0600  Weight: 54.4 kg 53.8 kg 53.7 kg    Labs & Radiologic Studies    CBC Recent Labs    05/24/19 0355 05/25/19 0222  WBC 9.4 9.2  HGB 9.8* 9.8*  HCT 33.3* 32.2*  MCV 86.5 85.2  PLT 205 [id number]   Basic Metabolic Panel Recent Labs    05/24/19 0355 05/25/19 0222  NA 139 141  K 4.4 4.4  CL 104 103  CO2 27 30  GLUCOSE 122* 108*  BUN 23 29*  CREATININE 1.14 1.27*  CALCIUM 8.4* 8.4*  MG 2.0  --    Liver Function Tests No results for input(s): AST, ALT, ALKPHOS, BILITOT, PROT, ALBUMIN in the last 72 hours. No results for input(s): LIPASE, AMYLASE in the last 72 hours. Cardiac Enzymes No results for input(s): CKTOTAL, CKMB, CKMBINDEX, TROPONINI in the last 72 hours. BNP Invalid input(s): POCBNP D-Dimer No results for input(s): DDIMER in the last 72 hours. Hemoglobin A1C No results for input(s): HGBA1C in the last 72 hours. Fasting Lipid Panel No results for input(s): CHOL, HDL, LDLCALC, TRIG, CHOLHDL, LDLDIRECT in the  last 72 hours. Thyroid Function Tests No results for input(s): TSH, T4TOTAL, T3FREE, THYROIDAB in the last 72 hours.  Invalid input(s): FREET3 _____________  Dg Chest 2 View  Result Date: 05/19/2019 CLINICAL DATA:  history of aortic stenosis, coronary artery disease status post coronary  artery bypass grafting in the remote past, hypertension, hyperlipidemia, hypothyroidism, and prostate cancer Pre op for valve replacement EXAM: CHEST - 2 VIEW COMPARISON:  11/16/2017. FINDINGS: Stable changes from prior CABG surgery. Cardiac silhouette is normal in size. No mediastinal or hilar masses. No evidence of adenopathy. Lungs are clear.  No pleural effusion or pneumothorax. Old healed left-sided rib fractures. There are compression fractures of the mid and lower thoracic spine stable from cardiac CT dated 05/10/2019. IMPRESSION: No acute cardiopulmonary disease. Electronically Signed   By: Lajean Manes M.D.   On: 05/19/2019 14:36   Ct Coronary Morph W/cta Cor W/score W/ca W/cm &/or Wo/cm  Addendum Date: 05/12/2019   ADDENDUM REPORT: 05/12/2019 07:48 ADDENDUM: Extracardiac findings were described separately under dictation for contemporaneously obtained CTA chest, abdomen and pelvis 05/10/2019. Please see that dictation for full description of relevant extracardiac findings. Electronically Signed   By: Vinnie Langton M.D.   On: 05/12/2019 07:48   Result Date: 05/12/2019 CLINICAL DATA:  85 year old male with severe aortic stenosis being evaluated for a TAVR procedure. EXAM: Cardiac TAVR CT TECHNIQUE: The patient was scanned on a [employer]. A 120 kV retrospective scan was triggered in the descending thoracic aorta at 111 HU's. Gantry rotation speed was 250 msecs and collimation was .6 mm. No beta blockade or nitro were given. The 3D data set was reconstructed in 5% intervals of the R-R cycle. Systolic and diastolic phases were analyzed on a dedicated work station using MPR, MIP and VRT modes. The patient received 80 cc of contrast. FINDINGS: Aortic Valve: Trileaflet aortic valve with severely thickened and calcified leaflets with severely restricted leaflet opening and minimal calcifications extending into the LVOT. Aorta: Normal size, moderate to severe diffuse atherosclerotic plaque and  calcifications in the sinotubular junction, aortic arch and descending aorta. Sinotubular Junction: 33 x 33 mm Ascending Thoracic Aorta: 37 x 34 mm Aortic Arch: 30 x 28 mm Descending Thoracic Aorta: 27 x 26 mm Sinus of Valsalva Measurements: Non-coronary: 40 mm Right -coronary: 38 mm Left -coronary: 39 mm Coronary Artery Height above Annulus: Left Main: 17 mm Right Coronary (occluded): 23 mm Virtual Basal Annulus Measurements: Maximum/Minimum Diameter: 31.6 x 26.7 mm Mean Diameter: 28.2 mm Perimeter: 90.3 mm Area: 623 mm2 Optimum Fluoroscopic Angle for Delivery:  LAO 6 CAU 9 IMPRESSION: 1. Trileaflet aortic valve with severely thickened and calcified leaflets with severely restricted leaflet opening and minimal calcifications extending into the LVOT. Aortic valve calcium score 4497 consistent with severe aortic stenosis. Annular measurements suitable for delivery of a 29 mm Edwards-SAPIEN 3 valve. 2. Sufficient coronary to annulus distance. 3. Optimum Fluoroscopic Angle for Delivery: LAO 6 CAU 9. 4. No thrombus in the left atrial appendage. Electronically Signed: By: Ena Dawley On: 05/11/2019 23:45   Dg Chest Port 1 View  Result Date: 05/23/2019 CLINICAL DATA:  Status post transcatheter aortic valve replacement. EXAM: PORTABLE CHEST 1 VIEW COMPARISON:  Single-view of the chest 11/16/2017. PA and lateral chest 05/19/2019. FINDINGS: Mild bibasilar atelectasis is present. New aortic valve is in place. The patient is status post CABG. Lungs are emphysematous. No pneumothorax. Atherosclerosis is seen. Multiple remote left rib fractures noted. IMPRESSION: No acute disease.  Mild bibasilar atelectasis is noted.  Emphysema and atherosclerosis. Electronically Signed   By: Inge Rise M.D.   On: 05/23/2019 15:09   Ct Angio Chest Aorta W &/or Wo Contrast  Result Date: 05/11/2019 CLINICAL DATA:  85 year old male with history of severe aortic stenosis. Preprocedural study prior to potential transcatheter aortic  valve replacement (TAVR) procedure. EXAM: CT ANGIOGRAPHY CHEST, ABDOMEN AND PELVIS TECHNIQUE: Multidetector CT imaging through the chest, abdomen and pelvis was performed using the standard protocol during bolus administration of intravenous contrast. Multiplanar reconstructed images and MIPs were obtained and reviewed to evaluate the vascular anatomy. CONTRAST:  161mL OMNIPAQUE IOHEXOL 350 MG/ML SOLN COMPARISON:  CT the chest, abdomen and pelvis 01/22/2016. FINDINGS: CTA CHEST FINDINGS Cardiovascular: Heart size is normal. There is no significant pericardial fluid, thickening or pericardial calcification. There is aortic atherosclerosis, as well as atherosclerosis of the great vessels of the mediastinum and the coronary arteries, including calcified atherosclerotic plaque in the left main, left anterior descending, left circumflex and right coronary arteries. Status post median sternotomy for CABG including LIMA to the LAD. Severe thickening calcification of the aortic valve. Mediastinum/Lymph Nodes: No pathologically enlarged mediastinal or hilar lymph nodes. Esophagus is unremarkable in appearance. No axillary lymphadenopathy. Lungs/Pleura: No suspicious appearing pulmonary nodules or masses are noted. No acute consolidative airspace disease. No pleural effusions. Diffuse bronchial wall thickening with moderate centrilobular and paraseptal emphysema. Nodular areas of pleuroparenchymal scarring in the lung apices bilaterally (left greater than right), likely to reflect areas of mild post infectious or inflammatory scarring. Musculoskeletal/Soft Tissues: Median sternotomy wires. Multiple compression fractures are noted at T8, T11 and T12, most severe at T11 where there is 70% loss of anterior vertebral body height. Most of these appear chronic, however, at T11 there are clearly visualized fracture lines, without significant paravertebral soft tissue swelling (suggestive of a remote subacute injury). There are no  aggressive appearing lytic or blastic lesions noted in the visualized portions of the skeleton. CTA ABDOMEN AND PELVIS FINDINGS Hepatobiliary: No suspicious cystic or solid hepatic lesions. No intra or extrahepatic biliary ductal dilatation. Partially calcified gallstones lying dependently in the gallbladder. Gallbladder is nearly completely contracted without surrounding inflammatory changes to suggest an acute cholecystitis at this time. Pancreas: No pancreatic mass. No pancreatic ductal dilatation. No pancreatic or peripancreatic fluid collections or inflammatory changes. Spleen: Unremarkable. Adrenals/Urinary Tract: In the posterior aspect of the lower pole of the right kidney there is an exophytic 1.3 cm enhancing lesion, highly concerning for small renal cell carcinoma, new compared to the prior study from 09/23/2017. Subcentimeter low-attenuation lesions in both kidneys, too small to characterize, but statistically likely to represent tiny cysts. No hydroureteronephrosis. In the left side of the urinary bladder there is a 1.1 x 1.0 cm enhancing lesion (axial image 198 of series 15), concerning for potential urothelial neoplasm. There is also a small amount of thickening and enhancement of the posterolateral wall of the urinary bladder on the right side (axial image 202 of series 15). Mild bilateral adrenal form adrenal thickening, nonspecific. Stomach/Bowel: Normal appearance of the stomach. No pathologic dilatation of small bowel or colon. Normal appendix. Vascular/Lymphatic: Aortic atherosclerosis, with fusiform aneurysmal dilatation of the infrarenal abdominal aorta measuring up to 2.8 x 3.2 cm immediately above the level of the bifurcation (axial image 152 of series 15). Vascular findings and measurements pertinent to potential T AVR procedure, as detailed below. No lymphadenopathy noted in the abdomen or pelvis. Reproductive: Status post hysterectomy. Ovaries are not confidently identified may be  surgically absent or atrophic. Other:  No significant volume of ascites.  No pneumoperitoneum. Musculoskeletal: Chronic appearing compression fracture of L1 with 60% loss of anterior vertebral body height is unchanged. Mildly expansile mixed lucent and sclerotic appearance of the left ilium, similar to prior studies, likely reflective of Paget's disease. There are no aggressive appearing lytic or blastic lesions noted in the visualized portions of the skeleton. VASCULAR MEASUREMENTS PERTINENT TO TAVR: AORTA: Minimal Aortic Diameter-15 x 10 mm Severity of Aortic Calcification-severe RIGHT PELVIS: Right Common Iliac Artery - Minimal Diameter-6.7 x 3.8 mm Tortuosity-mild Calcification-severe Right External Iliac Artery - Minimal Diameter-7.8 x 7.1 mm Tortuosity-moderate Calcification - moderate Right Common Femoral Artery - Minimal Diameter-6.2 x 6.2 mm Tortuosity-mild Calcification - moderate LEFT PELVIS: Left Common Iliac Artery - Minimal Diameter-7.6 x 5.3 mm Tortuosity-mild Calcification-severe Left External Iliac Artery - Minimal Diameter-6.0 x 5.6 mm Tortuosity-moderate Calcification - moderate Left Common Femoral Artery - Minimal Diameter-8.0 x 7.0 mm Tortuosity-mild Calcification-moderate Review of the MIP images confirms the above findings. IMPRESSION: 1. Vascular findings and measurements pertinent to potential TAVR procedure, as above. 2. Severe thickening calcification of the aortic valve, compatible with the reported clinical history of severe aortic stenosis. 3. Aortic atherosclerosis, in addition to left main and 3 vessel coronary artery disease. Status post median sternotomy for CABG including LIMA to the LAD. 4. Small enhancing areas in the urinary bladder posteriorly concerning for recurrent urothelial neoplasm. 5. In addition, today's study demonstrates an enhancing exophytic lesion measuring 1.3 cm in the posterior aspect of the lower pole of the right kidney, concerning for small renal cell  carcinoma. Outpatient referral to Urology for further evaluation is recommended. 6. In addition to numerous chronic compression fractures, there is a new compression fracture of T11 which has imaging characteristics suggestive of a remote subacute fracture, with 70% loss of anterior vertebral body height at this time. 7. Additional incidental findings, as above. Electronically Signed   By: Vinnie Langton M.D.   On: 05/11/2019 10:54   Vas US Carotid  Result Date: 05/10/2019 Carotid Arterial Duplex Study Other Factors:     AS Pre TAVR. Comparison Study:  11/19/2016 1% to 39% Bilaterally Performing Technologist: Toma Copier RVS  Examination Guidelines: A complete evaluation includes B-mode imaging, spectral Doppler, color Doppler, and power Doppler as needed of all accessible portions of each vessel. Bilateral testing is considered an integral part of a complete examination. Limited examinations for reoccurring indications may be performed as noted.  Right Carotid Findings: +----------+--------+--------+--------+--------------------+-------------------+             PSV cm/s EDV cm/s Stenosis Plaque Description   Comments             +----------+--------+--------+--------+--------------------+-------------------+  CCA Prox   65       11                                     intimal thickening   +----------+--------+--------+--------+--------------------+-------------------+  CCA Distal 60       9                 heterogenous and     Shadowing                                                   smooth                                    +----------+--------+--------+--------+--------------------+-------------------+  ICA Prox   58       12       1-39%    heterogenous and     moderate plaque at                                          irregular            the origin           +----------+--------+--------+--------+--------------------+-------------------+  ICA Mid    103      19                                      tortuous             +----------+--------+--------+--------+--------------------+-------------------+  ICA Distal 131      22                                     tortuous             +----------+--------+--------+--------+--------------------+-------------------+  ECA        85       10                heterogenous         acoustic shadowing   +----------+--------+--------+--------+--------------------+-------------------+ +----------+--------+-------+--------+-------------------+             PSV cm/s EDV cms Describe Arm Pressure (mmHG)  +----------+--------+-------+--------+-------------------+  Subclavian 51                                             +----------+--------+-------+--------+-------------------+ +---------+--------+--------+--------------------------------------+  Vertebral PSV cm/s EDV cm/s possible occlusion no color flow noted  +---------+--------+--------+--------------------------------------+  Left Carotid Findings: +----------+--------+--------+--------+---------------------+------------------+             PSV cm/s EDV cm/s Stenosis Plaque Description    Comments            +----------+--------+--------+--------+---------------------+------------------+  CCA Prox   75       12                                                          +----------+--------+--------+--------+---------------------+------------------+  CCA Distal 71       11                heterogenous and      mild plaque                                                irregular                                 +----------+--------+--------+--------+---------------------+------------------+  ICA Prox   96       18       1-39%  heterogenous and      mild plaque with                                           irregular             acoustice                                                                        shadowing           +----------+--------+--------+--------+---------------------+------------------+  ICA Mid    86       18                                       tortuous            +----------+--------+--------+--------+---------------------+------------------+  ICA Distal 81       14                                      tortuous            +----------+--------+--------+--------+---------------------+------------------+  ECA        71       0                 heterogenous          mild plaque at the                                                               origin              +----------+--------+--------+--------+---------------------+------------------+ +----------+--------+--------+--------+-------------------+             PSV cm/s EDV cm/s Describe Arm Pressure (mmHG)  +----------+--------+--------+--------+-------------------+  Subclavian 90                                              +----------+--------+--------+--------+-------------------+ +---------+--------+--+--------+--+  Vertebral PSV cm/s 93 EDV cm/s 12  +---------+--------+--+--------+--+  Summary: Right Carotid: Velocities in the right ICA are consistent with a 1-39% stenosis.                No change from previous study. Left Carotid: Velocities in the left ICA are consistent with a 1-39% stenosis.               No change from previous sudy. Vertebrals:  Left vertebral artery demonstrates antegrade flow. Right vertebral              artery demonstrates no discernable flow which is a change from              pevious study. Subclavians: Normal flow  hemodynamics were seen in bilateral subclavian              arteries. *See table(s) above for measurements and observations.  Electronically signed by Harold Barban MD on 05/10/2019 at 4:31:41 PM.    Final    Ct Angio Abdomen Pelvis  W &/or Wo Contrast  Result Date: 05/11/2019 CLINICAL DATA:  85 year old male with history of severe aortic stenosis. Preprocedural study prior to potential transcatheter aortic valve replacement (TAVR) procedure. EXAM: CT ANGIOGRAPHY CHEST, ABDOMEN AND PELVIS TECHNIQUE: Multidetector CT  imaging through the chest, abdomen and pelvis was performed using the standard protocol during bolus administration of intravenous contrast. Multiplanar reconstructed images and MIPs were obtained and reviewed to evaluate the vascular anatomy. CONTRAST:  160mL OMNIPAQUE IOHEXOL 350 MG/ML SOLN COMPARISON:  CT the chest, abdomen and pelvis 01/22/2016. FINDINGS: CTA CHEST FINDINGS Cardiovascular: Heart size is normal. There is no significant pericardial fluid, thickening or pericardial calcification. There is aortic atherosclerosis, as well as atherosclerosis of the great vessels of the mediastinum and the coronary arteries, including calcified atherosclerotic plaque in the left main, left anterior descending, left circumflex and right coronary arteries. Status post median sternotomy for CABG including LIMA to the LAD. Severe thickening calcification of the aortic valve. Mediastinum/Lymph Nodes: No pathologically enlarged mediastinal or hilar lymph nodes. Esophagus is unremarkable in appearance. No axillary lymphadenopathy. Lungs/Pleura: No suspicious appearing pulmonary nodules or masses are noted. No acute consolidative airspace disease. No pleural effusions. Diffuse bronchial wall thickening with moderate centrilobular and paraseptal emphysema. Nodular areas of pleuroparenchymal scarring in the lung apices bilaterally (left greater than right), likely to reflect areas of mild post infectious or inflammatory scarring. Musculoskeletal/Soft Tissues: Median sternotomy wires. Multiple compression fractures are noted at T8, T11 and T12, most severe at T11 where there is 70% loss of anterior vertebral body height. Most of these appear chronic, however, at T11 there are clearly visualized fracture lines, without significant paravertebral soft tissue swelling (suggestive of a remote subacute injury). There are no aggressive appearing lytic or blastic lesions noted in the visualized portions of the skeleton. CTA ABDOMEN AND  PELVIS FINDINGS Hepatobiliary: No suspicious cystic or solid hepatic lesions. No intra or extrahepatic biliary ductal dilatation. Partially calcified gallstones lying dependently in the gallbladder. Gallbladder is nearly completely contracted without surrounding inflammatory changes to suggest an acute cholecystitis at this time. Pancreas: No pancreatic mass. No pancreatic ductal dilatation. No pancreatic or peripancreatic fluid collections or inflammatory changes. Spleen: Unremarkable. Adrenals/Urinary Tract: In the posterior aspect of the lower pole of the right kidney there is an exophytic 1.3 cm enhancing lesion, highly concerning for small renal cell carcinoma, new compared to the prior study from 09/23/2017. Subcentimeter low-attenuation lesions in both kidneys, too small to characterize, but statistically likely to represent tiny cysts. No hydroureteronephrosis. In the left side of the urinary bladder there is a 1.1 x 1.0 cm enhancing lesion (axial image 198 of series 15), concerning for potential urothelial neoplasm. There is also a small amount of thickening and enhancement of the posterolateral wall of the urinary bladder on the right side (axial image 202 of series 15). Mild bilateral adrenal form adrenal thickening, nonspecific. Stomach/Bowel: Normal appearance of the stomach. No pathologic dilatation of small bowel or colon. Normal appendix. Vascular/Lymphatic: Aortic atherosclerosis, with fusiform aneurysmal dilatation of the infrarenal abdominal aorta measuring up to 2.8 x 3.2 cm immediately above the level of the bifurcation (axial image 152 of series 15). Vascular findings and measurements pertinent to potential T AVR procedure,  as detailed below. No lymphadenopathy noted in the abdomen or pelvis. Reproductive: Status post hysterectomy. Ovaries are not confidently identified may be surgically absent or atrophic. Other: No significant volume of ascites.  No pneumoperitoneum. Musculoskeletal: Chronic  appearing compression fracture of L1 with 60% loss of anterior vertebral body height is unchanged. Mildly expansile mixed lucent and sclerotic appearance of the left ilium, similar to prior studies, likely reflective of Paget's disease. There are no aggressive appearing lytic or blastic lesions noted in the visualized portions of the skeleton. VASCULAR MEASUREMENTS PERTINENT TO TAVR: AORTA: Minimal Aortic Diameter-15 x 10 mm Severity of Aortic Calcification-severe RIGHT PELVIS: Right Common Iliac Artery - Minimal Diameter-6.7 x 3.8 mm Tortuosity-mild Calcification-severe Right External Iliac Artery - Minimal Diameter-7.8 x 7.1 mm Tortuosity-moderate Calcification - moderate Right Common Femoral Artery - Minimal Diameter-6.2 x 6.2 mm Tortuosity-mild Calcification - moderate LEFT PELVIS: Left Common Iliac Artery - Minimal Diameter-7.6 x 5.3 mm Tortuosity-mild Calcification-severe Left External Iliac Artery - Minimal Diameter-6.0 x 5.6 mm Tortuosity-moderate Calcification - moderate Left Common Femoral Artery - Minimal Diameter-8.0 x 7.0 mm Tortuosity-mild Calcification-moderate Review of the MIP images confirms the above findings. IMPRESSION: 1. Vascular findings and measurements pertinent to potential TAVR procedure, as above. 2. Severe thickening calcification of the aortic valve, compatible with the reported clinical history of severe aortic stenosis. 3. Aortic atherosclerosis, in addition to left main and 3 vessel coronary artery disease. Status post median sternotomy for CABG including LIMA to the LAD. 4. Small enhancing areas in the urinary bladder posteriorly concerning for recurrent urothelial neoplasm. 5. In addition, today's study demonstrates an enhancing exophytic lesion measuring 1.3 cm in the posterior aspect of the lower pole of the right kidney, concerning for small renal cell carcinoma. Outpatient referral to Urology for further evaluation is recommended. 6. In addition to numerous chronic compression  fractures, there is a new compression fracture of T11 which has imaging characteristics suggestive of a remote subacute fracture, with 70% loss of anterior vertebral body height at this time. 7. Additional incidental findings, as above. Electronically Signed   By: Vinnie Langton M.D.   On: 05/11/2019 10:54   Disposition   Pt is being discharged home today in good condition.  Follow-up Plans & Appointments    Follow-up Information    Eileen Stanford, PA-C. Go on 05/31/2019.   Specialties: Cardiology, Radiology Why: @ 1:30 pm, please arrive at least 10 minutes early.  Contact information: [address] [place] [address] [phone]            Discharge Medications   Allergies as of 05/25/2019   No Known Allergies     Medication List    TAKE these medications   acetaminophen 500 MG tablet Commonly known as: TYLENOL Take 500-1,000 mg by mouth every 6 (six) hours as needed for moderate pain or headache.   aspirin EC 81 MG tablet Take 81 mg by mouth at bedtime.   B-12 PO Take 1 capsule by mouth daily.   CALCIUM PO Take 1 capsule by mouth daily.   carvedilol 3.125 MG tablet Commonly known as: COREG Take 1 tablet (3.125 mg total) by mouth as directed. Take 2 tablets in the morning and 1 tablet in the evening What changed: additional instructions   clopidogrel 75 MG tablet Commonly known as: PLAVIX Take 1 tablet (75 mg total) by mouth daily with breakfast.   furosemide 20 MG tablet Commonly known as: LASIX Take 1 tablet (20 mg total) by mouth daily.  What changed: when to take this Notes to patient: You were given the IV form of this medication today in the hospital . Please resume normal dosing tomorrow (10/1)   levothyroxine 75 MCG tablet Commonly known as: SYNTHROID Take 1 tablet (75 mcg total) by mouth daily. What changed: when to take this   MELATONIN PO Take 1 tablet by mouth at bedtime.   multivitamin with minerals Tabs  tablet Take 1 tablet by mouth daily.   nitroGLYCERIN 0.4 MG SL tablet Commonly known as: NITROSTAT Place 1 tablet (0.4 mg total) under the tongue every 5 (five) minutes as needed. What changed: reasons to take this   pramipexole 0.25 MG tablet Commonly known as: MIRAPEX TAKE 1 TABLET(0.25 MG) BY MOUTH AT BEDTIME   PRESERVISION AREDS 2 PO Take 1 tablet by mouth 2 (two) times daily.   Refresh Tears 0.5 % Soln Generic drug: carboxymethylcellulose Place 1 drop into both eyes 3 (three) times daily as needed (dry eyes).   VITAMIN D PO Take 1 capsule by mouth daily.            Durable Medical Equipment  (From admission, onward)         Start     Ordered   05/25/19 1056  For home use only DME oxygen  Once    Question Answer Comment  Length of Need 12 Months   Mode or (Route) Nasal cannula   Liters per Minute 2   Frequency Continuous (stationary and portable oxygen unit needed)   Oxygen delivery system Gas      05/25/19 1056            Outstanding Labs/Studies   None   Duration of Discharge Encounter   Greater than 30 minutes including physician time.  Mable Fill, PA-C 05/25/2019, 10:57 AM [phone]  Patient seen, examined. Available data reviewed. Agree with findings, assessment, and plan as outlined by Nell Range, PA-C. Elderly man, NAD, very hard of hearing. Lungs CTA, heart RRR soft SEM at the RUSB, abdomen soft, NT. Groin sites clear bilaterally. No edema. Tele reviewed and shows sinus rhythm with no significant arrhythmia. Echo shows normal LV function and normal function of the TAVR valve. Plan home today with O2 as needed. Continue same medical regimen. Resume oral lasix tomorrow.   Raymond Swanson, M.D. 05/25/2019 12:59 PM

## 2019-05-26 ENCOUNTER — Telehealth: Payer: Self-pay | Admitting: Physician Assistant

## 2019-05-26 NOTE — Telephone Encounter (Signed)
  [place] VALVE TEAM   Patient contacted regarding discharge from [facility] on 05/25/19  Patient understands to follow up with provider Nell Range on 10/7 at [facility].  Patient understands discharge instructions? yes Patient understands medications and regimen? yes Patient understands to bring all medications to this visit? yes  Angelena Form PA-C  [facility]

## 2019-05-26 NOTE — Anesthesia Postprocedure Evaluation (Signed)
Anesthesia Post Note  Patient: Raymond Swanson  Procedure(s) Performed: TRANSCATHETER AORTIC VALVE REPLACEMENT, TRANSFEMORAL (N/A ) TRANSESOPHAGEAL ECHOCARDIOGRAM (TEE) (N/A )     Patient location during evaluation: PACU Anesthesia Type: MAC Level of consciousness: awake and alert Pain management: pain level controlled Vital Signs Assessment: post-procedure vital signs reviewed and stable Respiratory status: spontaneous breathing, nonlabored ventilation, respiratory function stable and patient connected to nasal cannula oxygen Cardiovascular status: stable and blood pressure returned to baseline Postop Assessment: no apparent nausea or vomiting Anesthetic complications: no    Last Vitals:  Vitals:   05/25/19 0825 05/25/19 0900  BP: (!) 148/71   Pulse: 98   Resp: (!) 23 18  Temp: 36.8 C   SpO2: 95%     Last Pain:  Vitals:   05/25/19 0825  TempSrc: Oral  PainSc:                  [place] S

## 2019-05-29 DIAGNOSIS — I5033 Acute on chronic diastolic (congestive) heart failure: Secondary | ICD-10-CM | POA: Diagnosis not present

## 2019-05-29 NOTE — Progress Notes (Signed)
[facility] [place]                                       Cardiology Office Note    Date:  05/31/2019   ID:  Raymond Swanson, DOB 17-Feb-1925, MRN [id number]  PCP:  Isaac Bliss, Rayford Halsted, MD  Cardiologist: Minus Breeding, MD / Dr. Burt Knack & Dr. Roxy Manns (TAVR)  CC: TOC s/p TAVR  History of Present Illness:  Raymond Swanson is a 85 y.o. male with a history of HTN, HLD, NSVT, CAD s/p CABGx3V ([place] 1991), CKD stage III, hx of prostate cancer, bladder cancer s/p TURBT, previous traumatic SHD, previous syncope and severe ASs/p TAVR (05/23/19) who presents to clinic for follow up.   Patient's cardiac history dates back to 1991 when he was found to have three-vessel coronary artery disease after having undergone routine exercise treadmill stress test.He underwent coronary artery bypass grafting x3 by Dr. Redmond Pulling. He has done well from a cardiac standpoint ever since and for several years has been followed carefully by Dr. Percival Spanish. Echocardiograms have documented the presence of preserved left ventricular function with aortic stenosis that has gradually progressed in severity. The patient was recentlyevaluated by Dr. Percival Spanish and reported to syncopal episodes. He has a long history of exertional shortness of breath that has progressed substantially over the past year. His appetite is been down and he has lost a fair amount of weight. He describes to frank syncopal episodes without any associated chest pain. Follow-up transthoracic echocardiogram performed Jan 17, 2019 revealed normal left ventricular systolic function with severe aortic stenosis and moderate aortic insufficiency. He was referred to the multidisciplinary heart valve clinic and has been evaluated previously by Dr. Burt Knack. Diagnostic cardiac catheterization was performed April 28, 2019 and revealed severe native coronary artery disease with continued patency of bypass grafts in all 3 major  vascular territories.  He was evaluated by the multidisciplinary valve team and underwent successful TAVR with a71mm Edwards Sapien 3 THV via the TF approach on 05/23/19. Post operative echo showed EF 55-60%, normally functioning TAVR with with a mean gradient of 6 mm Hg and no PVL. He was continued on aspirin and started on plavix. He was noted to have ambulatory desaturations and was discharged on home 02. He was discharged with home health PT.   Today he presents to clinic for follow up. Here with [name], his caregiver. He is doing well. A little disappointed that the valve replacement hasn't helped him stability and balance. No CP. He still has some SOB, but thinks it has improved. No LE edema, orthopnea or PND. No dizziness or syncope. No blood in stool or urine. No palpitations.    Past Medical History:  Diagnosis Date   Acute gastric ulcer with hemorrhage, without mention of obstruction 09/10/2009   ANEMIA 10/08/2009   CORONARY ARTERY DISEASE 01/26/2007   s/p remote CABG x3V   Dyspnea    History of kidney stones    HOH (hard of hearing)    Hx of bladder cancer    s/p TURBT by Dr. Lovena Neighbours   HYPERLIPIDEMIA 01/26/2007   HYPERTENSION 01/26/2007   HYPOTHYROIDISM 10/18/2007   PROSTATE CANCER, HX OF 01/26/2007   SKIN CANCER, HX OF 01/26/2007   Subdural hematoma ([place]) 2016   fall from deck while cleaning gutters; sustained subdemral hematoma ; doing ok now  Past Surgical History:  Procedure Laterality Date   CATARACT EXTRACTION     CORONARY ARTERY BYPASS GRAFT     1991; reports at PAT appt 10-11-17: i went to my priamry doctor for a physical and had aroutine stress test that was bad, was sent for cath ( more than 1 but nsure how many or if stents were placed), he rprots " that didnt work so they did the surgery". denies heart symptoms for over 20 years;  sees  cardiology [facility] annually for EKGs    CYSTOSCOPY W/ URETERAL STENT PLACEMENT Right 10/13/2017   Procedure:  CYSTOSCOPY WITH STENT REPLACEMENT;  Surgeon: Ceasar Mons, MD;  Location: [facility];  Service: Urology;  Laterality: Right;   EXCISION MASS UPPER EXTREMETIES Left 08/11/2016   Procedure: EXCISION MASS LEFT SHOULDER, LEFT FOREARM, LEFT WRIST;  Surgeon: Judeth Horn, MD;  Location: [facility];  Service: General;  Laterality: Left;   HERNIA REPAIR     ingunial   MOHS SURGERY     PROSTATE SURGERY     prostatectomy   RIGHT/LEFT HEART CATH AND CORONARY/GRAFT ANGIOGRAPHY N/A 04/28/2019   Procedure: RIGHT/LEFT HEART CATH AND CORONARY/GRAFT ANGIOGRAPHY;  Surgeon: Sherren Mocha, MD;  Location: [facility];  Service: Cardiovascular;  Laterality: N/A;   TEE WITHOUT CARDIOVERSION N/A 11/17/2017   Procedure: TRANSESOPHAGEAL ECHOCARDIOGRAM (TEE);  Surgeon: Dixie Dials, MD;  Location: [facility];  Service: Cardiovascular;  Laterality: N/A;   TEE WITHOUT CARDIOVERSION N/A 05/23/2019   Procedure: TRANSESOPHAGEAL ECHOCARDIOGRAM (TEE);  Surgeon: Sherren Mocha, MD;  Location: [facility] LAB;  Service: Open Heart Surgery;  Laterality: N/A;   TRANSCATHETER AORTIC VALVE REPLACEMENT, TRANSFEMORAL N/A 05/23/2019   Procedure: TRANSCATHETER AORTIC VALVE REPLACEMENT, TRANSFEMORAL;  Surgeon: Sherren Mocha, MD;  Location: [place] CV LAB;  Service: Open Heart Surgery;  Laterality: N/A;   TRANSURETHRAL RESECTION OF BLADDER TUMOR N/A 10/13/2017   Procedure: TRANSURETHRAL RESECTION OF BLADDER TUMOR/ BIPOLAR (TURBT);  Surgeon: Ceasar Mons, MD;  Location: [facility];  Service: Urology;  Laterality: N/A;  ONLY NEEDS 90 MIN FOR BOTH PROCEDURES    Current Medications: Outpatient Medications Prior to Visit  Medication Sig Dispense Refill   acetaminophen (TYLENOL) 500 MG tablet Take 500-1,000 mg by mouth every 6 (six) hours as needed for moderate pain or headache.     aspirin EC 81 MG tablet Take 81 mg by mouth at bedtime.      CALCIUM PO Take 1 capsule by mouth daily.      carboxymethylcellulose (REFRESH TEARS) 0.5 % SOLN Place 1 drop into both eyes 3 (three) times daily as needed (dry eyes).     carvedilol (COREG) 3.125 MG tablet Take 1 tablet (3.125 mg total) by mouth as directed. Take 2 tablets in the morning and 1 tablet in the evening (Patient taking differently: Take 3.125 mg by mouth as directed. Take 6.25 mg in the morning and 3.125 in the evening) 30 tablet 11   clopidogrel (PLAVIX) 75 MG tablet Take 1 tablet (75 mg total) by mouth daily with breakfast. 90 tablet 1   Cyanocobalamin (B-12 PO) Take 1 capsule by mouth daily.     furosemide (LASIX) 20 MG tablet Take 1 tablet (20 mg total) by mouth daily. (Patient taking differently: Take 20 mg by mouth daily with lunch. ) 90 tablet 1   levothyroxine (SYNTHROID) 75 MCG tablet Take 1 tablet (75 mcg total) by mouth daily. (Patient taking differently: Take 75 mcg by mouth daily before breakfast. ) 90 tablet 1  MELATONIN PO Take 1 tablet by mouth at bedtime.      Multiple Vitamin (MULTIVITAMIN WITH MINERALS) TABS tablet Take 1 tablet by mouth daily.     Multiple Vitamins-Minerals (PRESERVISION AREDS 2 PO) Take 1 tablet by mouth 2 (two) times daily.      nitroGLYCERIN (NITROSTAT) 0.4 MG SL tablet Place 1 tablet (0.4 mg total) under the tongue every 5 (five) minutes as needed. (Patient taking differently: Place 0.4 mg under the tongue every 5 (five) minutes as needed for chest pain. ) 20 tablet 5   pramipexole (MIRAPEX) 0.25 MG tablet TAKE 1 TABLET(0.25 MG) BY MOUTH AT BEDTIME 90 tablet 1   VITAMIN D PO Take 1 capsule by mouth daily.     No facility-administered medications prior to visit.      Allergies:   Patient has no known allergies.   Social History   Socioeconomic History   Marital status: Married    Spouse name: Not on file   Number of children: Not on file   Years of education: Not on file   Highest education level: Not on file  Occupational History   Not on file  Social Needs    Financial resource strain: Not on file   Food insecurity    Worry: Not on file    Inability: Not on file   Transportation needs    Medical: Not on file    Non-medical: Not on file  Tobacco Use   Smoking status: Former Smoker    Packs/day: 1.00    Years: 20.00    Pack years: 20.00    Types: Cigarettes    Quit date: 10/28/1948    Years since quitting: 70.6   Smokeless tobacco: Never Used  Substance and Sexual Activity   Alcohol use: Not Currently    Comment: 4 oz. wine daily   Drug use: No   Sexual activity: Not on file  Lifestyle   Physical activity    Days per week: Not on file    Minutes per session: Not on file   Stress: Not on file  Relationships   Social connections    Talks on phone: Not on file    Gets together: Not on file    Attends religious service: Not on file    Active member of club or organization: Not on file    Attends meetings of clubs or organizations: Not on file    Relationship status: Not on file  Other Topics Concern   Not on file  Social History Narrative   Not on file     Family History:  The patient's family history includes GI problems in his sister; Lung cancer (age of onset: 14) in his brother.     ROS:   Please see the history of present illness.    ROS All other systems reviewed and are negative.   PHYSICAL EXAM:   VS:  BP (!) 128/54    Pulse 81    Ht 5\' 8"  (1.727 m)    Wt 119 lb (54 kg)    SpO2 94%    BMI 18.09 kg/m    GEN: thin, elderly appearing male HEENT: normal Neck: no JVD or masses Cardiac: regular; no murmurs, rubs, or gallops,no edema  Respiratory:  clear to auscultation bilaterally, normal work of breathing GI: soft, nontender, nondistended, + BS MS: no deformity or atrophy Skin: warm and dry, no rash.  Groin sites clear without hematoma or ecchymosis  Neuro:  Alert and Oriented x 3,  Strength and sensation are intact Psych: euthymic mood, full affect   Wt Readings from Last 3 Encounters:  05/31/19 119 lb  (54 kg)  05/25/19 118 lb 4.8 oz (53.7 kg)  05/19/19 120 lb (54.4 kg)      Studies/Labs Reviewed:   EKG:  EKG is ordered today. The ekg ordered today demonstrates sinus vs junctional rhythm with baseline artifact and PVCs, RAD, and marked TWIs (discussed with Dr. Tamala Julian who felt it was related to conduction disturbance).  HR 81  Recent Labs: 07/12/2018: TSH 2.76 05/19/2019: ALT 22; B Natriuretic Peptide 583.6 05/24/2019: Magnesium 2.0 05/25/2019: BUN 29; Creatinine, Ser 1.27; Hemoglobin 9.8; Platelets 172; Potassium 4.4; Sodium 141   Lipid Panel    Component Value Date/Time   CHOL 94 11/14/2017 0418   TRIG 43 11/14/2017 0418   HDL 43 11/14/2017 0418   CHOLHDL 2.2 11/14/2017 0418   VLDL 9 11/14/2017 0418   LDLCALC 42 11/14/2017 0418    Additional studies/ records that were reviewed today include:  TAVR OPERATIVE NOTE   Date of Procedure:05/23/2019  Preoperative Diagnosis:Severe Aortic Stenosis   Postoperative Diagnosis:Same   Procedure:   Transcatheter Aortic Valve Replacement - PercutaneousRightTransfemoral Approach Edwards Sapien 3 THV (size 31mm, model # 9600TFX, serial [id number])  Co-Surgeons:Clarence H. Roxy Manns, MD and Sherren Mocha, MD  Anesthesiologist:Adam Marcie Bal, MD  Echocardiographer:Peter Johnsie Cancel, MD  Pre-operative Echo Findings: ? Severe aortic stenosis ? Normalleft ventricular systolic function  Post-operative Echo Findings: ? Noparavalvular leak ? Normalleft ventricular systolic function  _____________  Echo9/30/20: IMPRESSIONS 1. Left ventricular ejection fraction, by visual estimation, is 55 to 60%. The left ventricle has normal function. Normal left ventricular size. There is mildly increased left ventricular hypertrophy. 2. Left ventricular diastolic Doppler parameters are consistent with impaired relaxation pattern  of LV diastolic filling. 3. Bioprosthetic aortic valve s/p TAVR with 29 mm Edwards Sapien 3. No peri-valvular regurgitation noted. Mean gradient 6 mmHg with calculated AVA 2.3 cm^2. 4. Mild mitral annular calcification. 5. The mitral valve is normal in structure. No evidence of mitral valve regurgitation. No evidence of mitral stenosis. 6. Left atrial size was mildly dilated. 7. Right atrial size was normal. 8. Global right ventricle has normal systolic function.The right ventricular size is normal. No increase in right ventricular wall thickness. 9. The tricuspid valve is normal in structure. Tricuspid valve regurgitation is trivial. 10. The inferior vena cava is dilated in size with >50% respiratory variability, suggesting right atrial pressure of 8 mmHg. 11. The tricuspid regurgitant velocity is 3.34 m/s, and with an assumed right atrial pressure of 8 mmHg, the estimated right ventricular systolic pressure is moderately elevated at 52.6 mmHg.    ASSESSMENT & PLAN:   Severe AS s/p TAVR:doing well. ECG today shows possible junctional rhythm but no HAVB. SBE prophylaxis discussed; the patient is edentulous and does not go to the dentist. Continue on aspirin and plavix.   HTN: BP well controlled toady   Chronic diastolic CHF: appears euvolemic. Continue lasix 20mg  daily.   CAD s/p CABGx3V: pre TAVR cath showedsevere multivessel CAD with continued patency of the LIMA-LAD, SVG-OM, and SVG-RCA.Continue medical therapy.   Hx of bladder CA: pre TAVR scan small enhancing areas in the urinary bladder posteriorly concerning for recurrent urothelial neoplasmas well as anenhancing exophytic lesion measuring 1.3 cm in the posterior aspect of the lower pole of the right kidney, concerning for small renal cell carcinoma. He had a recent UTI with gross hematuria last week treated by Dr. Lovena Neighbours. Repeat UA showed resolution  of bacteruria but persistent hematuria. I discussed CT findings with  Dr. Lovena Neighbours over the phone and he is aware.   Medication Adjustments/Labs and Tests Ordered: Current medicines are reviewed at length with the patient today.  Concerns regarding medicines are outlined above.  Medication changes, Labs and Tests ordered today are listed in the Patient Instructions below. Patient Instructions  Please keep your upcoming appointments! See you soon.    Signed, Angelena Form, PA-C  05/31/2019 4:38 PM    [facility] [place], [place], [place]  [address] Phone: [phone]; Fax: [phone]

## 2019-05-31 ENCOUNTER — Other Ambulatory Visit: Payer: Self-pay | Admitting: Physician Assistant

## 2019-05-31 ENCOUNTER — Encounter: Payer: Self-pay | Admitting: Physician Assistant

## 2019-05-31 ENCOUNTER — Other Ambulatory Visit: Payer: Self-pay

## 2019-05-31 ENCOUNTER — Ambulatory Visit: Payer: Medicare Other | Admitting: Physician Assistant

## 2019-05-31 VITALS — BP 128/54 | HR 81 | Ht 68.0 in | Wt 119.0 lb

## 2019-05-31 DIAGNOSIS — I5032 Chronic diastolic (congestive) heart failure: Secondary | ICD-10-CM | POA: Diagnosis not present

## 2019-05-31 DIAGNOSIS — I2510 Atherosclerotic heart disease of native coronary artery without angina pectoris: Secondary | ICD-10-CM

## 2019-05-31 DIAGNOSIS — Z8551 Personal history of malignant neoplasm of bladder: Secondary | ICD-10-CM

## 2019-05-31 DIAGNOSIS — Z952 Presence of prosthetic heart valve: Secondary | ICD-10-CM | POA: Diagnosis not present

## 2019-05-31 DIAGNOSIS — I10 Essential (primary) hypertension: Secondary | ICD-10-CM | POA: Diagnosis not present

## 2019-05-31 NOTE — Patient Instructions (Signed)
Please keep your upcoming appointments! See you soon.

## 2019-06-05 ENCOUNTER — Ambulatory Visit (INDEPENDENT_AMBULATORY_CARE_PROVIDER_SITE_OTHER): Payer: Medicare Other | Admitting: Family Medicine

## 2019-06-05 ENCOUNTER — Other Ambulatory Visit: Payer: Self-pay

## 2019-06-05 ENCOUNTER — Encounter: Payer: Self-pay | Admitting: Family Medicine

## 2019-06-05 VITALS — BP 124/69 | HR 85 | Temp 98.4°F | Ht 68.0 in | Wt 118.4 lb

## 2019-06-05 DIAGNOSIS — Z23 Encounter for immunization: Secondary | ICD-10-CM

## 2019-06-05 DIAGNOSIS — W19XXXA Unspecified fall, initial encounter: Secondary | ICD-10-CM | POA: Diagnosis not present

## 2019-06-05 DIAGNOSIS — T148XXA Other injury of unspecified body region, initial encounter: Secondary | ICD-10-CM | POA: Diagnosis not present

## 2019-06-05 NOTE — Patient Instructions (Signed)
It was very nice to see you today!  Please keep your bandages clean and dry.  We will have a wound care specialist come to your house.  Let us know if your wounds develop any signs of infection including pus, redness, or worsening pain.  Take care, Dr Jerline Pain

## 2019-06-05 NOTE — Progress Notes (Signed)
   Chief Complaint:  Raymond Swanson is a 85 y.o. male who presents today with a chief complaint of multiple skin tears.   Assessment/Plan:  Multiple skin tears At this point his wound will need to heal by secondary intention - will bandage today. Will place referral for wound care.  He has quite a bit of ecchymoses but does not have any signs of ongoing bleeding-we will continue Plavix for the time being.  Discussed reasons to return to care including signs of infection and uncontrolled bleeding.  Fall Seems to be mostly mechanical in nature.  History not consistent with syncopal episode.  No significant abnormalities on neurological exam today.  He is already working with physical therapy - will continue.   Preventative Healthcare Flu vaccine given today.     Subjective:  HPI:  Skin Tears Patient unfortunately suffered a fall 2 days ago while at home.  Fall was witnessed by his wife.  Patient states that he became tangled in his walker and fell onto a carpeted floor.  Fell onto his left arm.  He has multiple skin tears 1 on his left shoulder and one on his left elbow.  He bandaged the area up with Band-Aids and was able to control the bleeding.  Does not currently have any pain.  Has not tried anything else for the skin tears.  Patient did not lose consciousness.  Patient just recently underwent TAVR about 2 weeks ago and has been doing well.  Did not have any sort of chest pain or shortness of breath during the episode.  No pain.  He is able to move his arm without difficulty.  No other obvious aggravating or alleviating factors.  ROS: Per HPI  PMH: He reports that he quit smoking about 70 years ago. His smoking use included cigarettes. He has a 20.00 pack-year smoking history. He has never used smokeless tobacco. He reports previous alcohol use. He reports that he does not use drugs.      Objective:  Physical Exam: BP 124/69   Pulse 85   Temp 98.4 F (36.9 C)   Ht 5\' 8"  (1.727 m)    Wt 118 lb 6.1 oz (53.7 kg)   SpO2 93%   BMI 18.00 kg/m   Gen: NAD, resting comfortably Skin: Approximately 5 cm x 4 cm skin tear on left anterior shoulder and approximately 1 cm x 2 cm skin tear on left lateral epicondyles.  Surrounding ecchymosis at both the sites of skin tear.  Granulation tissue present at base of each skin tear.  No surrounding erythema.  No purulent drainage. MSK: Bilateral arms neurovascularly intact distally.  Able to reach above his head and behind his back without pain or significant difficulty. Neuro: Moves all extremities without difficulty.  Cranial nerves through 12 grossly intact.     Algis Greenhouse. Jerline Pain, MD 06/05/2019 3:25 PM

## 2019-06-05 NOTE — Addendum Note (Signed)
Addended by: Loralyn Freshwater on: 06/05/2019 04:04 PM   Modules accepted: Orders

## 2019-06-09 DIAGNOSIS — W19XXXD Unspecified fall, subsequent encounter: Secondary | ICD-10-CM | POA: Diagnosis not present

## 2019-06-09 DIAGNOSIS — Z952 Presence of prosthetic heart valve: Secondary | ICD-10-CM | POA: Diagnosis not present

## 2019-06-09 DIAGNOSIS — S41012D Laceration without foreign body of left shoulder, subsequent encounter: Secondary | ICD-10-CM | POA: Diagnosis not present

## 2019-06-09 DIAGNOSIS — S51012D Laceration without foreign body of left elbow, subsequent encounter: Secondary | ICD-10-CM | POA: Diagnosis not present

## 2019-06-14 LAB — BLOOD GAS, ARTERIAL
Acid-Base Excess: 4.7 mmol/L — ABNORMAL HIGH (ref 0.0–2.0)
Bicarbonate: 28.8 mmol/L — ABNORMAL HIGH (ref 20.0–28.0)
Drawn by: 336831
FIO2: 0.21
O2 Saturation: 93.1 %
Patient temperature: 98.6
pCO2 arterial: 43.9 mmHg (ref 32.0–48.0)
pH, Arterial: 7.432 (ref 7.350–7.450)
pO2, Arterial: 65.5 mmHg — ABNORMAL LOW (ref 83.0–108.0)

## 2019-06-15 DIAGNOSIS — S41002A Unspecified open wound of left shoulder, initial encounter: Secondary | ICD-10-CM | POA: Diagnosis not present

## 2019-06-16 DIAGNOSIS — S41002A Unspecified open wound of left shoulder, initial encounter: Secondary | ICD-10-CM | POA: Diagnosis not present

## 2019-06-22 ENCOUNTER — Other Ambulatory Visit: Payer: Self-pay

## 2019-06-22 ENCOUNTER — Ambulatory Visit (HOSPITAL_COMMUNITY): Payer: Medicare Other | Attending: Cardiovascular Disease

## 2019-06-22 ENCOUNTER — Ambulatory Visit (INDEPENDENT_AMBULATORY_CARE_PROVIDER_SITE_OTHER): Payer: Medicare Other | Admitting: Physician Assistant

## 2019-06-22 ENCOUNTER — Other Ambulatory Visit: Payer: Self-pay | Admitting: Physician Assistant

## 2019-06-22 VITALS — BP 142/70 | HR 89 | Ht 68.0 in | Wt 118.1 lb

## 2019-06-22 DIAGNOSIS — I2510 Atherosclerotic heart disease of native coronary artery without angina pectoris: Secondary | ICD-10-CM | POA: Diagnosis not present

## 2019-06-22 DIAGNOSIS — I10 Essential (primary) hypertension: Secondary | ICD-10-CM | POA: Diagnosis not present

## 2019-06-22 DIAGNOSIS — R5383 Other fatigue: Secondary | ICD-10-CM | POA: Diagnosis not present

## 2019-06-22 DIAGNOSIS — Z952 Presence of prosthetic heart valve: Secondary | ICD-10-CM | POA: Diagnosis not present

## 2019-06-22 DIAGNOSIS — I5032 Chronic diastolic (congestive) heart failure: Secondary | ICD-10-CM | POA: Diagnosis not present

## 2019-06-22 DIAGNOSIS — Z8551 Personal history of malignant neoplasm of bladder: Secondary | ICD-10-CM

## 2019-06-22 MED ORDER — LORAZEPAM 0.5 MG PO TABS: 0.2500 mg | ORAL_TABLET | Freq: Every evening | ORAL | 0 refills | Status: DC | PRN

## 2019-06-22 MED ORDER — SERTRALINE HCL 25 MG PO TABS: 25.0000 mg | ORAL_TABLET | Freq: Every day | ORAL | 1 refills | Status: DC

## 2019-06-22 NOTE — Progress Notes (Addendum)
[facility]                                       Cardiology Office Note    Date:  06/23/2019   ID:  ADONIJAH KUBLY, DOB [birth year], MRN [id number]  PCP:  Isaac Bliss, Rayford Halsted, MD  Cardiologist: Minus Breeding, MD / Dr. Burt Knack & Dr. Roxy Manns (TAVR)  CC: 1 month s/p TAVR  History of Present Illness:  GIANLUCAS GOVIN is a 85 y.o. male with a history of HTN, HLD, NSVT, CAD s/p CABGx3V ([place] 1991), CKD stage III, hx of prostate cancer, bladder cancer s/p TURBT, previous traumatic SHD, previous syncope and severe ASs/p TAVR (05/23/19) who presents to clinic for follow up.   Patient's cardiac history dates back to 1991 when he was found to have three-vessel coronary artery disease after having undergone routine exercise treadmill stress test.He underwent coronary artery bypass grafting x3 by Dr. Redmond Pulling. He has done well from a cardiac standpoint ever since and for several years has been followed carefully by Dr. Percival Spanish. Echocardiograms have documented the presence of preserved left ventricular function with aortic stenosis that has gradually progressed in severity. The patient was recentlyevaluated by Dr. Percival Spanish and reported to syncopal episodes. He has a long history of exertional shortness of breath that has progressed substantially over the past year. His appetite is been down and he has lost a fair amount of weight. He describes to frank syncopal episodes without any associated chest pain. Follow-up transthoracic echocardiogram performed Jan 17, 2019 revealed normal left ventricular systolic function with severe aortic stenosis and moderate aortic insufficiency. He was referred to the multidisciplinary heart valve clinic and has been evaluated previously by Dr. Burt Knack. Diagnostic cardiac catheterization was performed April 28, 2019 and revealed severe native coronary artery disease with continued patency of bypass grafts in all 3 major  vascular territories.  He was evaluated by the multidisciplinary valve team and underwent successful TAVR with a24mm Edwards Sapien 3 THV via the TF approach on 05/23/19. Post operative echo showed EF 55-60%, normally functioning TAVR with with a mean gradient of 6 mm Hg and no PVL. He was continued on aspirin and started on plavix. He was noted to have ambulatory desaturations and was discharged on home 02. He was discharged with home health PT.   Today he presents to clinic for follow up. He is feeling terrible. He is very disappointed that he hasn't felt any better since TAVR. He gets short of breath and fatigued just walking across the room. He has no "get up a go" and doesn't find joy in any of his usual activities like reading. He feels helpless and doesn't feel like life is worth living like this. Much more agitated. No LE edema, orthopnea or PND. No chest pain. He also complains of RLS that keeps him up for days and requests a sleeping pill. He did not sleep for 2 days straight and then had a fall related to this.   Past Medical History:  Diagnosis Date   Acute gastric ulcer with hemorrhage, without mention of obstruction 09/10/2009   ANEMIA 10/08/2009   CORONARY ARTERY DISEASE 01/26/2007   s/p remote CABG x3V   Dyspnea    History of kidney stones    HOH (hard of hearing)    Hx of bladder cancer    s/p TURBT by  Dr. Lovena Neighbours   HYPERLIPIDEMIA 01/26/2007   HYPERTENSION 01/26/2007   HYPOTHYROIDISM 10/18/2007   PROSTATE CANCER, HX OF 01/26/2007   SKIN CANCER, HX OF 01/26/2007   Subdural hematoma ([place]) 2016   fall from deck while cleaning gutters; sustained subdemral hematoma ; doing ok now     Past Surgical History:  Procedure Laterality Date   CATARACT EXTRACTION     CORONARY ARTERY BYPASS GRAFT     1991; reports at PAT appt 10-11-17: i went to my priamry doctor for a physical and had aroutine stress test that was bad, was sent for cath ( more than 1 but nsure how many or if  stents were placed), he rprots " that didnt work so they did the surgery". denies heart symptoms for over 20 years;  sees  cardiology [facility] annually for EKGs    CYSTOSCOPY W/ URETERAL STENT PLACEMENT Right 10/13/2017   Procedure: CYSTOSCOPY WITH STENT REPLACEMENT;  Surgeon: Ceasar Mons, MD;  Location: [facility];  Service: Urology;  Laterality: Right;   EXCISION MASS UPPER EXTREMETIES Left 08/11/2016   Procedure: EXCISION MASS LEFT SHOULDER, LEFT FOREARM, LEFT WRIST;  Surgeon: Judeth Horn, MD;  Location: [facility];  Service: General;  Laterality: Left;   HERNIA REPAIR     ingunial   MOHS SURGERY     PROSTATE SURGERY     prostatectomy   RIGHT/LEFT HEART CATH AND CORONARY/GRAFT ANGIOGRAPHY N/A 04/28/2019   Procedure: RIGHT/LEFT HEART CATH AND CORONARY/GRAFT ANGIOGRAPHY;  Surgeon: Sherren Mocha, MD;  Location: [facility];  Service: Cardiovascular;  Laterality: N/A;   TEE WITHOUT CARDIOVERSION N/A 11/17/2017   Procedure: TRANSESOPHAGEAL ECHOCARDIOGRAM (TEE);  Surgeon: Dixie Dials, MD;  Location: [facility];  Service: Cardiovascular;  Laterality: N/A;   TEE WITHOUT CARDIOVERSION N/A 05/23/2019   Procedure: TRANSESOPHAGEAL ECHOCARDIOGRAM (TEE);  Surgeon: Sherren Mocha, MD;  Location: [facility];  Service: Open Heart Surgery;  Laterality: N/A;   TRANSCATHETER AORTIC VALVE REPLACEMENT, TRANSFEMORAL N/A 05/23/2019   Procedure: TRANSCATHETER AORTIC VALVE REPLACEMENT, TRANSFEMORAL;  Surgeon: Sherren Mocha, MD;  Location: [facility];  Service: Open Heart Surgery;  Laterality: N/A;   TRANSURETHRAL RESECTION OF BLADDER TUMOR N/A 10/13/2017   Procedure: TRANSURETHRAL RESECTION OF BLADDER TUMOR/ BIPOLAR (TURBT);  Surgeon: Ceasar Mons, MD;  Location: [facility];  Service: Urology;  Laterality: N/A;  ONLY NEEDS 90 MIN FOR BOTH PROCEDURES    Current Medications: Outpatient Medications Prior to Visit  Medication Sig Dispense Refill   acetaminophen  (TYLENOL) 500 MG tablet Take 500-1,000 mg by mouth every 6 (six) hours as needed for moderate pain or headache.     aspirin EC 81 MG tablet Take 81 mg by mouth at bedtime.      CALCIUM PO Take 1 capsule by mouth daily.     carboxymethylcellulose (REFRESH TEARS) 0.5 % SOLN Place 1 drop into both eyes 3 (three) times daily as needed (dry eyes).     carvedilol (COREG) 3.125 MG tablet Take 6.25 mg by mouth in the morning and 3.125 mg by mouth in the evening     clopidogrel (PLAVIX) 75 MG tablet Take 1 tablet (75 mg total) by mouth daily with breakfast. 90 tablet 1   Cyanocobalamin (B-12 PO) Take 1 capsule by mouth daily.     furosemide (LASIX) 20 MG tablet Take 20 mg by mouth daily.     levothyroxine (SYNTHROID) 75 MCG tablet Take 75 mcg by mouth daily before breakfast.     MELATONIN PO Take 1  tablet by mouth at bedtime.      Multiple Vitamin (MULTIVITAMIN WITH MINERALS) TABS tablet Take 1 tablet by mouth daily.     Multiple Vitamins-Minerals (PRESERVISION AREDS 2 PO) Take 1 tablet by mouth 2 (two) times daily.      nitroGLYCERIN (NITROSTAT) 0.4 MG SL tablet Place 0.4 mg under the tongue every 5 (five) minutes as needed for chest pain.     VITAMIN D PO Take 1 capsule by mouth daily.     carvedilol (COREG) 3.125 MG tablet Take 1 tablet (3.125 mg total) by mouth as directed. Take 2 tablets in the morning and 1 tablet in the evening (Patient taking differently: Take 3.125 mg by mouth as directed. Take 6.25 mg in the morning and 3.125 in the evening) 30 tablet 11   furosemide (LASIX) 20 MG tablet Take 1 tablet (20 mg total) by mouth daily. (Patient taking differently: Take 20 mg by mouth daily with lunch. ) 90 tablet 1   levothyroxine (SYNTHROID) 75 MCG tablet Take 1 tablet (75 mcg total) by mouth daily. (Patient taking differently: Take 75 mcg by mouth daily before breakfast. ) 90 tablet 1   nitroGLYCERIN (NITROSTAT) 0.4 MG SL tablet Place 1 tablet (0.4 mg total) under the tongue every 5  (five) minutes as needed. (Patient taking differently: Place 0.4 mg under the tongue every 5 (five) minutes as needed for chest pain. ) 20 tablet 5   pramipexole (MIRAPEX) 0.25 MG tablet TAKE 1 TABLET(0.25 MG) BY MOUTH AT BEDTIME (Patient not taking: Reported on 06/22/2019) 90 tablet 1   No facility-administered medications prior to visit.      Allergies:   Patient has no known allergies.   Social History   Socioeconomic History   Marital status: Married    Spouse name: Not on file   Number of children: Not on file   Years of education: Not on file   Highest education level: Not on file  Occupational History   Not on file  Social Needs   Financial resource strain: Not on file   Food insecurity    Worry: Not on file    Inability: Not on file   Transportation needs    Medical: Not on file    Non-medical: Not on file  Tobacco Use   Smoking status: Former Smoker    Packs/day: 1.00    Years: 20.00    Pack years: 20.00    Types: Cigarettes    Quit date: 10/28/1948    Years since quitting: 70.6   Smokeless tobacco: Never Used  Substance and Sexual Activity   Alcohol use: Not Currently    Comment: 4 oz. wine daily   Drug use: No   Sexual activity: Not on file  Lifestyle   Physical activity    Days per week: Not on file    Minutes per session: Not on file   Stress: Not on file  Relationships   Social connections    Talks on phone: Not on file    Gets together: Not on file    Attends religious service: Not on file    Active member of club or organization: Not on file    Attends meetings of clubs or organizations: Not on file    Relationship status: Not on file  Other Topics Concern   Not on file  Social History Narrative   Not on file     Family History:  The patient's family history includes GI problems in his sister; Lung cancer (  age of onset: 48) in his brother.     ROS:   Please see the history of present illness.    ROS All other systems  reviewed and are negative.   PHYSICAL EXAM:   VS:  BP (!) 142/70    Pulse 89    Ht 5\' 8"  (1.727 m)    Wt 118 lb 1.9 oz (53.6 kg)    SpO2 91%    BMI 17.96 kg/m    GEN: thin, elderly appearing male HEENT: normal Neck: no JVD or masses Cardiac: regular; soft flow murmur. No rubs, or gallops,no edema  Respiratory:  clear to auscultation bilaterally, normal work of breathing GI: soft, nontender, nondistended, + BS MS: no deformity or atrophy Skin: warm and dry, no rash. Neuro:  Alert and Oriented x 3, Strength and sensation are intact Psych: euthymic mood, full affect   Wt Readings from Last 3 Encounters:  06/22/19 118 lb 1.9 oz (53.6 kg)  06/05/19 118 lb 6.1 oz (53.7 kg)  05/31/19 119 lb (54 kg)      Studies/Labs Reviewed:   EKG:  EKG is NOT ordered today.   Recent Labs: 05/19/2019: ALT 22; B Natriuretic Peptide 583.6 05/24/2019: Magnesium 2.0 06/22/2019: BUN 32; Creatinine, Ser 1.18; Hemoglobin 9.7; Platelets 236; Potassium 4.1; Sodium 141; TSH 6.430   Lipid Panel    Component Value Date/Time   CHOL 94 11/14/2017 0418   TRIG 43 11/14/2017 0418   HDL 43 11/14/2017 0418   CHOLHDL 2.2 11/14/2017 0418   VLDL 9 11/14/2017 0418   LDLCALC 42 11/14/2017 0418    Additional studies/ records that were reviewed today include:  TAVR OPERATIVE NOTE   Date of Procedure:05/23/2019  Preoperative Diagnosis:Severe Aortic Stenosis   Postoperative Diagnosis:Same   Procedure:   Transcatheter Aortic Valve Replacement - PercutaneousRightTransfemoral Approach Edwards Sapien 3 THV (size 27mm, model # 9600TFX, serial [id number])  Co-Surgeons:Clarence H. Roxy Manns, MD and Sherren Mocha, MD  Anesthesiologist:Adam Marcie Bal, MD  Echocardiographer:Peter Johnsie Cancel, MD  Pre-operative Echo Findings: ? Severe aortic stenosis ? Normalleft ventricular systolic  function  Post-operative Echo Findings: ? Noparavalvular leak ? Normalleft ventricular systolic function  _____________  Echo9/30/20: IMPRESSIONS 1. Left ventricular ejection fraction, by visual estimation, is 55 to 60%. The left ventricle has normal function. Normal left ventricular size. There is mildly increased left ventricular hypertrophy. 2. Left ventricular diastolic Doppler parameters are consistent with impaired relaxation pattern of LV diastolic filling. 3. Bioprosthetic aortic valve s/p TAVR with 29 mm Edwards Sapien 3. No peri-valvular regurgitation noted. Mean gradient 6 mmHg with calculated AVA 2.3 cm^2. 4. Mild mitral annular calcification. 5. The mitral valve is normal in structure. No evidence of mitral valve regurgitation. No evidence of mitral stenosis. 6. Left atrial size was mildly dilated. 7. Right atrial size was normal. 8. Global right ventricle has normal systolic function.The right ventricular size is normal. No increase in right ventricular wall thickness. 9. The tricuspid valve is normal in structure. Tricuspid valve regurgitation is trivial. 10. The inferior vena cava is dilated in size with >50% respiratory variability, suggesting right atrial pressure of 8 mmHg. 11. The tricuspid regurgitant velocity is 3.34 m/s, and with an assumed right atrial pressure of 8 mmHg, the estimated right ventricular systolic pressure is moderately elevated at 52.6 mmHg.  Aortic Valve: The aortic valve has been repaired/replaced. Aortic valve regurgitation is not visualized. Aortic regurgitation PHT measures 199 msec. Aortic valve mean gradient measures 11.0 mmHg. Aortic valve peak gradient measures 20.8 mmHg. Aortic  valve area, by  VTI measures 1.53 cm. 70mm Edwards Sapien bioprosthetic, stented aortic valve (TAVR) valve is present in the aortic position. Procedure Date: 05/23/19.  ASSESSMENT & PLAN:   Severe AS s/p TAVR: echo today shows EF 60-65%, normally  functioning TAVR with mean gradient of 11 mmHg and no PVL. He has NYHA class III symptoms of shortness of breath and fatigue. SBE prophylaxis discussed; he has dentures and does not go to the dentist. Plavix can be discontinued after 6 months of therapy and he will continue on a baby aspirin 81mg  daily.   HTN: BP mildly elevated, but the patient is very agitated today. No changes made  Chronic diastolic CHF: appears euvolemic. Continue lasix 20mg  daily.   CAD s/p CABGx3V: pre TAVR cath showedsevere multivessel CAD with continued patency of the LIMA-LAD, SVG-OM, and SVG-RCA.Continue medical therapy.   Hx of bladder CA: pre TAVR scan small enhancing areas in the urinary bladder posteriorly concerning for recurrent urothelial neoplasmas well as anenhancing exophytic lesion measuring 1.3 cm in the posterior aspect of the lower pole of the right kidney, concerning for small renal cell carcinoma. He is followed closely by Dr. Lovena Neighbours who is aware of these findings  Dyspnea and fatigue: his valve is functioning normally. He does not appear volume overloaded. I will check basic labs like TSH and CBC to make sure no obvious reason for worsening symptoms.   Depression/insomnia/RLS: I suspect a lot of his symptoms are from depression. He is willing to try something for this. I have Rx'd Zoloft at the lowest dose for him to try. He also has RLS at night that has kept him up for 2 days straight. I have asked him to follow up with his PCP about this. He has an appointment in 2 weeks.    Medication Adjustments/Labs and Tests Ordered: Current medicines are reviewed at length with the patient today.  Concerns regarding medicines are outlined above.  Medication changes, Labs and Tests ordered today are listed in the Patient Instructions below. Patient Instructions  Medication Instructions:  1) START ZOLOFT 25 mg daily 2) you may STOP PLAVIX 11/20/2019  Labwork: TODAY: TSH, CBC, BMET  Follow-Up: Your  provider recommends that you schedule a follow-up appointment in 3-4 months with Dr. Percival Spanish.  You will be called to arrange your 1 year TAVR echo and office visit.     Signed, Angelena Form, PA-C  06/23/2019 9:05 AM    [facility] [address], [place], [place]  [address] Phone: [phone]; Fax: [phone]

## 2019-06-22 NOTE — Patient Instructions (Addendum)
Medication Instructions:  1) START ZOLOFT 25 mg daily 2) you may STOP PLAVIX 11/20/2019  Labwork: TODAY: TSH, CBC, BMET  Follow-Up: Your provider recommends that you schedule a follow-up appointment in 3-4 months with Dr. Percival Spanish.  You will be called to arrange your 1 year TAVR echo and office visit.

## 2019-06-23 LAB — CBC WITH DIFFERENTIAL/PLATELET
Basophils Absolute: 0.1 10*3/uL (ref 0.0–0.2)
Basos: 1 %
EOS (ABSOLUTE): 0.5 10*3/uL — ABNORMAL HIGH (ref 0.0–0.4)
Eos: 6 %
Hematocrit: 31.9 % — ABNORMAL LOW (ref 37.5–51.0)
Hemoglobin: 9.7 g/dL — ABNORMAL LOW (ref 13.0–17.7)
Immature Grans (Abs): 0 10*3/uL (ref 0.0–0.1)
Immature Granulocytes: 1 %
Lymphocytes Absolute: 0.7 10*3/uL (ref 0.7–3.1)
Lymphs: 10 %
MCH: 25.3 pg — ABNORMAL LOW (ref 26.6–33.0)
MCHC: 30.4 g/dL — ABNORMAL LOW (ref 31.5–35.7)
MCV: 83 fL (ref 79–97)
Monocytes Absolute: 0.9 10*3/uL (ref 0.1–0.9)
Monocytes: 12 %
Neutrophils Absolute: 5.2 10*3/uL (ref 1.4–7.0)
Neutrophils: 70 %
Platelets: 236 10*3/uL (ref 150–450)
RBC: 3.84 x10E6/uL — ABNORMAL LOW (ref 4.14–5.80)
RDW: 16.1 % — ABNORMAL HIGH (ref 11.6–15.4)
WBC: 7.3 10*3/uL (ref 3.4–10.8)

## 2019-06-23 LAB — BASIC METABOLIC PANEL
BUN/Creatinine Ratio: 27 — ABNORMAL HIGH (ref 10–24)
BUN: 32 mg/dL (ref 10–36)
CO2: 27 mmol/L (ref 20–29)
Calcium: 9.8 mg/dL (ref 8.6–10.2)
Chloride: 99 mmol/L (ref 96–106)
Creatinine, Ser: 1.18 mg/dL (ref 0.76–1.27)
GFR calc Af Amer: 62 mL/min/{1.73_m2} (ref >59)
GFR calc non Af Amer: 53 mL/min/{1.73_m2} — ABNORMAL LOW (ref >59)
Glucose: 101 mg/dL — ABNORMAL HIGH (ref 65–99)
Potassium: 4.1 mmol/L (ref 3.5–5.2)
Sodium: 141 mmol/L (ref 134–144)

## 2019-06-23 LAB — TSH: TSH: 6.43 u[IU]/mL — ABNORMAL HIGH (ref 0.450–4.500)

## 2019-06-26 ENCOUNTER — Telehealth: Payer: Self-pay | Admitting: Physician Assistant

## 2019-06-26 ENCOUNTER — Encounter (INDEPENDENT_AMBULATORY_CARE_PROVIDER_SITE_OTHER): Payer: Medicare Other | Admitting: Ophthalmology

## 2019-06-26 ENCOUNTER — Other Ambulatory Visit: Payer: Self-pay

## 2019-06-26 DIAGNOSIS — H353231 Exudative age-related macular degeneration, bilateral, with active choroidal neovascularization: Secondary | ICD-10-CM

## 2019-06-26 DIAGNOSIS — H43813 Vitreous degeneration, bilateral: Secondary | ICD-10-CM

## 2019-06-26 DIAGNOSIS — H35033 Hypertensive retinopathy, bilateral: Secondary | ICD-10-CM | POA: Diagnosis not present

## 2019-06-26 DIAGNOSIS — I10 Essential (primary) hypertension: Secondary | ICD-10-CM | POA: Diagnosis not present

## 2019-06-26 NOTE — Telephone Encounter (Signed)
Spoke with Raymond Swanson [phone]). Reiterated to her that PA was needed for Lorazepam and it is best to defer new PRN medication to PCP next week. She was in agreement and thankful for call.

## 2019-06-26 NOTE — Telephone Encounter (Signed)
  Pharmacy is calling because patient called them to see if they had received a prescription for Lorazepam that was supposed to be prescribed after visit on 06/22/19. Did not see medication under med list

## 2019-06-29 DIAGNOSIS — I5033 Acute on chronic diastolic (congestive) heart failure: Secondary | ICD-10-CM | POA: Diagnosis not present

## 2019-07-06 ENCOUNTER — Ambulatory Visit: Payer: Medicare Other | Admitting: Internal Medicine

## 2019-07-24 DIAGNOSIS — N3941 Urge incontinence: Secondary | ICD-10-CM | POA: Diagnosis not present

## 2019-07-24 DIAGNOSIS — C672 Malignant neoplasm of lateral wall of bladder: Secondary | ICD-10-CM | POA: Diagnosis not present

## 2019-07-24 DIAGNOSIS — R9721 Rising PSA following treatment for malignant neoplasm of prostate: Secondary | ICD-10-CM | POA: Diagnosis not present

## 2019-07-27 ENCOUNTER — Ambulatory Visit (INDEPENDENT_AMBULATORY_CARE_PROVIDER_SITE_OTHER): Payer: Medicare Other | Admitting: Internal Medicine

## 2019-07-27 ENCOUNTER — Encounter: Payer: Self-pay | Admitting: Internal Medicine

## 2019-07-27 ENCOUNTER — Other Ambulatory Visit: Payer: Self-pay

## 2019-07-27 VITALS — BP 110/60 | HR 73 | Temp 97.6°F | Wt 119.9 lb

## 2019-07-27 DIAGNOSIS — F339 Major depressive disorder, recurrent, unspecified: Secondary | ICD-10-CM | POA: Diagnosis not present

## 2019-07-27 DIAGNOSIS — Z952 Presence of prosthetic heart valve: Secondary | ICD-10-CM

## 2019-07-27 MED ORDER — SERTRALINE HCL 50 MG PO TABS: 25.0000 mg | ORAL_TABLET | Freq: Every day | ORAL | 0 refills | Status: DC

## 2019-07-27 NOTE — Patient Instructions (Signed)
-  Nice seeing you today!!  -Schedule follow up in 6 weeks for annual physical and for follow up on depression.  -Schedule appointment with Dennison Bulla for therapy sessions.  -Start Zoloft. Tablets are 50 mg. Break them in half to take 25 mg daily.

## 2019-07-27 NOTE — Progress Notes (Signed)
Established Patient Office Visit     This visit occurred during the SARS-CoV-2 public health emergency.  Safety protocols were in place, including screening questions prior to the visit, additional usage of staff PPE, and extensive cleaning of exam room while observing appropriate contact time as indicated for disinfecting solutions.    CC/Reason for Visit: Discuss depression  HPI: Raymond Swanson is a 85 y.o. male who is coming in today for the above mentioned reasons.  Since I last saw him he has had a very complicated summer.  He had several syncopal events due to his severe aortic stenosis culminating in a TAVR.  He has felt very depressed since.  He feels like the procedure did not have see results that he was hoping.  He is still using a walker, still has some dyspnea on exertion.  He is having severe insomnia, very fatigued, he feels like he has not bounced back as expected.  He is here today with a caregiver that agrees that his mood is depressed at home.   Past Medical/Surgical History: Past Medical History:  Diagnosis Date  . Acute gastric ulcer with hemorrhage, without mention of obstruction 09/10/2009  . ANEMIA 10/08/2009  . CORONARY ARTERY DISEASE 01/26/2007   s/p remote CABG x3V  . Dyspnea   . History of kidney stones   . HOH (hard of hearing)   . Hx of bladder cancer    s/p TURBT by Dr. Lovena Neighbours  . HYPERLIPIDEMIA 01/26/2007  . HYPERTENSION 01/26/2007  . HYPOTHYROIDISM 10/18/2007  . PROSTATE CANCER, HX OF 01/26/2007  . SKIN CANCER, HX OF 01/26/2007  . Subdural hematoma ([place]) 2016   fall from deck while cleaning gutters; sustained subdemral hematoma ; doing ok now     Past Surgical History:  Procedure Laterality Date  . CATARACT EXTRACTION    . CORONARY ARTERY BYPASS GRAFT     1991; reports at PAT appt 10-11-17: i went to my priamry doctor for a physical and had aroutine stress test that was bad, was sent for cath ( more than 1 but nsure how many or if stents were  placed), he rprots " that didnt work so they did the surgery". denies heart symptoms for over 20 years;  sees  cardiology [facility] annually for EKGs   . CYSTOSCOPY W/ URETERAL STENT PLACEMENT Right 10/13/2017   Procedure: CYSTOSCOPY WITH STENT REPLACEMENT;  Surgeon: Ceasar Mons, MD;  Location: [facility];  Service: Urology;  Laterality: Right;  . EXCISION MASS UPPER EXTREMETIES Left 08/11/2016   Procedure: EXCISION MASS LEFT SHOULDER, LEFT FOREARM, LEFT WRIST;  Surgeon: Judeth Horn, MD;  Location: [facility];  Service: General;  Laterality: Left;  . HERNIA REPAIR     ingunial  . MOHS SURGERY    . PROSTATE SURGERY     prostatectomy  . RIGHT/LEFT HEART CATH AND CORONARY/GRAFT ANGIOGRAPHY N/A 04/28/2019   Procedure: RIGHT/LEFT HEART CATH AND CORONARY/GRAFT ANGIOGRAPHY;  Surgeon: Sherren Mocha, MD;  Location: [facility];  Service: Cardiovascular;  Laterality: N/A;  . TEE WITHOUT CARDIOVERSION N/A 11/17/2017   Procedure: TRANSESOPHAGEAL ECHOCARDIOGRAM (TEE);  Surgeon: Dixie Dials, MD;  [facility];  Service: Cardiovascular;  Laterality: N/A;  . TEE WITHOUT CARDIOVERSION N/A 05/23/2019   Procedure: TRANSESOPHAGEAL ECHOCARDIOGRAM (TEE);  Surgeon: Sherren Mocha, MD;  Location: [facility];  Service: Open Heart Surgery;  Laterality: N/A;  . TRANSCATHETER AORTIC VALVE REPLACEMENT, TRANSFEMORAL N/A 05/23/2019   Procedure: TRANSCATHETER AORTIC VALVE REPLACEMENT, TRANSFEMORAL;  Surgeon: Burt Knack,  Legrand Como, MD;  Location: [facility];  Service: Open Heart Surgery;  Laterality: N/A;  . TRANSURETHRAL RESECTION OF BLADDER TUMOR N/A 10/13/2017   Procedure: TRANSURETHRAL RESECTION OF BLADDER TUMOR/ BIPOLAR (TURBT);  Surgeon: Ceasar Mons, MD;  Location: [facility];  Service: Urology;  Laterality: N/A;  ONLY NEEDS 90 MIN FOR BOTH PROCEDURES    Social History:  reports that he quit smoking about 70 years ago. His smoking use included cigarettes. He has a 20.00 pack-year  smoking history. He has never used smokeless tobacco. He reports previous alcohol use. He reports that he does not use drugs.  Allergies: No Known Allergies  Family History:  Family History  Problem Relation Age of Onset  . Lung cancer Brother 60  . GI problems Sister      Current Outpatient Medications:  .  acetaminophen (TYLENOL) 500 MG tablet, Take 500-1,000 mg by mouth every 6 (six) hours as needed for moderate pain or headache., Disp: , Rfl:  .  aspirin EC 81 MG tablet, Take 81 mg by mouth at bedtime. , Disp: , Rfl:  .  CALCIUM PO, Take 1 capsule by mouth daily., Disp: , Rfl:  .  carboxymethylcellulose (REFRESH TEARS) 0.5 % SOLN, Place 1 drop into both eyes 3 (three) times daily as needed (dry eyes)., Disp: , Rfl:  .  carvedilol (COREG) 3.125 MG tablet, Take 6.25 mg by mouth in the morning and 3.125 mg by mouth in the evening, Disp: , Rfl:  .  clopidogrel (PLAVIX) 75 MG tablet, Take 1 tablet (75 mg total) by mouth daily with breakfast., Disp: 90 tablet, Rfl: 1 .  Cyanocobalamin (B-12 PO), Take 1 capsule by mouth daily., Disp: , Rfl:  .  furosemide (LASIX) 20 MG tablet, Take 20 mg by mouth daily., Disp: , Rfl:  .  levothyroxine (SYNTHROID) 75 MCG tablet, Take 75 mcg by mouth daily before breakfast., Disp: , Rfl:  .  MELATONIN PO, Take 1 tablet by mouth at bedtime. , Disp: , Rfl:  .  Multiple Vitamin (MULTIVITAMIN WITH MINERALS) TABS tablet, Take 1 tablet by mouth daily., Disp: , Rfl:  .  Multiple Vitamins-Minerals (PRESERVISION AREDS 2 PO), Take 1 tablet by mouth 2 (two) times daily. , Disp: , Rfl:  .  nitroGLYCERIN (NITROSTAT) 0.4 MG SL tablet, Place 0.4 mg under the tongue every 5 (five) minutes as needed for chest pain., Disp: , Rfl:  .  sertraline (ZOLOFT) 50 MG tablet, Take 0.5 tablets (25 mg total) by mouth daily., Disp: 90 tablet, Rfl: 0 .  tamsulosin (FLOMAX) 0.4 MG CAPS capsule, Take 0.4 mg by mouth daily., Disp: , Rfl:  .  VITAMIN D PO, Take 1 capsule by mouth daily., Disp:  , Rfl:   Review of Systems:  Constitutional: Denies fever, chills, diaphoresis, appetite change and fatigue.  HEENT: Denies photophobia, eye pain, redness, hearing loss, ear pain, congestion, sore throat, rhinorrhea, sneezing, mouth sores, trouble swallowing, neck pain, neck stiffness and tinnitus.   Respiratory: Denies SOB, DOE, cough, chest tightness,  and wheezing.   Cardiovascular: Denies chest pain, palpitations and leg swelling.  Gastrointestinal: Denies nausea, vomiting, abdominal pain, diarrhea, constipation, blood in stool and abdominal distention.  Genitourinary: Denies dysuria, urgency, frequency, hematuria, flank pain and difficulty urinating.  Endocrine: Denies: hot or cold intolerance, sweats, changes in hair or nails, polyuria, polydipsia. Musculoskeletal: Denies myalgias, back pain, joint swelling, arthralgias and gait problem.  Skin: Denies pallor, rash and wound.  Neurological: Denies dizziness, seizures, syncope, weakness, light-headedness, numbness  and headaches.  Hematological: Denies adenopathy. Easy bruising, personal or family bleeding history  Psychiatric/Behavioral: Denies suicidal ideation,  confusion, nervousness, sleep disturbance and agitation    Physical Exam: Vitals:   07/27/19 1330  BP: 110/60  Pulse: 73  Temp: 97.6 F (36.4 C)  TempSrc: Temporal  SpO2: 97%  Weight: 119 lb 14.4 oz (54.4 kg)    Body mass index is 18.23 kg/m.   Constitutional: NAD, calm, comfortable Eyes: PERRL, lids and conjunctivae normal ENMT: Mucous membranes are moist.  Respiratory: clear to auscultation bilaterally, no wheezing, no crackles. Normal respiratory effort. No accessory muscle use.  Cardiovascular: Regular rate and rhythm, no murmurs / rubs / gallops. No extremity edema. 2+ pedal pulses. No carotid bruits.  Abdomen: no tenderness, no masses palpated. No hepatosplenomegaly. Bowel sounds positive.  Musculoskeletal: no clubbing / cyanosis. No joint deformity upper  and lower extremities. Good ROM, no contractures. Normal muscle tone.  Skin: no rashes, lesions, ulcers. No induration Neurologic: Grossly intact and nonfocal,  Psychiatric: Normal judgment and insight. Alert and oriented x 3.  Mood appears depressed   Impression and Plan:  Depression, recurrent ([place]) -   Office Visit from 07/27/2019 in [facility]  PHQ-9 Total Score  9     -He does appear to be depressed, has scored a 9 on PHQ today. -I think he would benefit from starting low-dose antidepressant given age.  We will start Zoloft 25 mg daily. -I have encouraged him that I believe he has bounced back pretty well from this procedure given his age and clinical comorbidities. -The fact that he has not had any further syncopal events is promising.  S/P TAVR (transcatheter aortic valve replacement) -Continue to follow-up with cardiology    Patient Instructions  -Nice seeing you today!!  -Schedule follow up in 6 weeks for annual physical and for follow up on depression.  -Schedule appointment with Denn[facility] Bulla for therapy sessions.  -Start Zoloft. Tablets are 50 mg. Break them in half to take 25 mg daily.     Lelon Frohlich, MD [facility]

## 2019-07-29 DIAGNOSIS — I5033 Acute on chronic diastolic (congestive) heart failure: Secondary | ICD-10-CM | POA: Diagnosis not present

## 2019-08-06 ENCOUNTER — Emergency Department (HOSPITAL_COMMUNITY): Payer: Medicare Other

## 2019-08-06 ENCOUNTER — Other Ambulatory Visit: Payer: Self-pay

## 2019-08-06 ENCOUNTER — Observation Stay (HOSPITAL_COMMUNITY)
Admission: EM | Admit: 2019-08-06 | Discharge: 2019-08-08 | Disposition: A | Discharge status: Home / Self Care | Payer: Medicare Other | Attending: Internal Medicine | Admitting: Internal Medicine

## 2019-08-06 ENCOUNTER — Encounter (HOSPITAL_COMMUNITY): Payer: Self-pay

## 2019-08-06 DIAGNOSIS — I5032 Chronic diastolic (congestive) heart failure: Secondary | ICD-10-CM | POA: Insufficient documentation

## 2019-08-06 DIAGNOSIS — I10 Essential (primary) hypertension: Secondary | ICD-10-CM | POA: Diagnosis not present

## 2019-08-06 DIAGNOSIS — I2510 Atherosclerotic heart disease of native coronary artery without angina pectoris: Secondary | ICD-10-CM | POA: Insufficient documentation

## 2019-08-06 DIAGNOSIS — Z79890 Hormone replacement therapy (postmenopausal): Secondary | ICD-10-CM | POA: Diagnosis not present

## 2019-08-06 DIAGNOSIS — S0003XA Contusion of scalp, initial encounter: Secondary | ICD-10-CM | POA: Diagnosis not present

## 2019-08-06 DIAGNOSIS — Z87891 Personal history of nicotine dependence: Secondary | ICD-10-CM | POA: Insufficient documentation

## 2019-08-06 DIAGNOSIS — Z9181 History of falling: Secondary | ICD-10-CM | POA: Diagnosis not present

## 2019-08-06 DIAGNOSIS — Z952 Presence of prosthetic heart valve: Secondary | ICD-10-CM

## 2019-08-06 DIAGNOSIS — Z8546 Personal history of malignant neoplasm of prostate: Secondary | ICD-10-CM | POA: Diagnosis not present

## 2019-08-06 DIAGNOSIS — S0101XA Laceration without foreign body of scalp, initial encounter: Secondary | ICD-10-CM | POA: Insufficient documentation

## 2019-08-06 DIAGNOSIS — J439 Emphysema, unspecified: Secondary | ICD-10-CM | POA: Diagnosis not present

## 2019-08-06 DIAGNOSIS — F419 Anxiety disorder, unspecified: Secondary | ICD-10-CM | POA: Insufficient documentation

## 2019-08-06 DIAGNOSIS — W19XXXA Unspecified fall, initial encounter: Secondary | ICD-10-CM | POA: Insufficient documentation

## 2019-08-06 DIAGNOSIS — Z7982 Long term (current) use of aspirin: Secondary | ICD-10-CM | POA: Insufficient documentation

## 2019-08-06 DIAGNOSIS — Z8711 Personal history of peptic ulcer disease: Secondary | ICD-10-CM | POA: Insufficient documentation

## 2019-08-06 DIAGNOSIS — E039 Hypothyroidism, unspecified: Secondary | ICD-10-CM | POA: Diagnosis not present

## 2019-08-06 DIAGNOSIS — Z951 Presence of aortocoronary bypass graft: Secondary | ICD-10-CM | POA: Insufficient documentation

## 2019-08-06 DIAGNOSIS — I110 Hypertensive heart disease with heart failure: Secondary | ICD-10-CM | POA: Diagnosis not present

## 2019-08-06 DIAGNOSIS — Z20828 Contact with and (suspected) exposure to other viral communicable diseases: Secondary | ICD-10-CM | POA: Diagnosis not present

## 2019-08-06 DIAGNOSIS — Z7902 Long term (current) use of antithrombotics/antiplatelets: Secondary | ICD-10-CM | POA: Diagnosis not present

## 2019-08-06 DIAGNOSIS — I4891 Unspecified atrial fibrillation: Secondary | ICD-10-CM | POA: Insufficient documentation

## 2019-08-06 DIAGNOSIS — D649 Anemia, unspecified: Secondary | ICD-10-CM | POA: Diagnosis present

## 2019-08-06 DIAGNOSIS — I6782 Cerebral ischemia: Secondary | ICD-10-CM | POA: Diagnosis not present

## 2019-08-06 DIAGNOSIS — Z8551 Personal history of malignant neoplasm of bladder: Secondary | ICD-10-CM | POA: Insufficient documentation

## 2019-08-06 DIAGNOSIS — D509 Iron deficiency anemia, unspecified: Secondary | ICD-10-CM | POA: Diagnosis not present

## 2019-08-06 DIAGNOSIS — G2581 Restless legs syndrome: Secondary | ICD-10-CM | POA: Insufficient documentation

## 2019-08-06 DIAGNOSIS — Z79899 Other long term (current) drug therapy: Secondary | ICD-10-CM | POA: Insufficient documentation

## 2019-08-06 DIAGNOSIS — R58 Hemorrhage, not elsewhere classified: Secondary | ICD-10-CM | POA: Diagnosis not present

## 2019-08-06 DIAGNOSIS — G9389 Other specified disorders of brain: Secondary | ICD-10-CM | POA: Diagnosis not present

## 2019-08-06 DIAGNOSIS — R0602 Shortness of breath: Secondary | ICD-10-CM | POA: Diagnosis not present

## 2019-08-06 DIAGNOSIS — S199XXA Unspecified injury of neck, initial encounter: Secondary | ICD-10-CM | POA: Diagnosis not present

## 2019-08-06 LAB — CBC WITH DIFFERENTIAL/PLATELET
Abs Immature Granulocytes: 0.04 10*3/uL (ref 0.00–0.07)
Basophils Absolute: 0 10*3/uL (ref 0.0–0.1)
Basophils Relative: 0 %
Eosinophils Absolute: 0.3 10*3/uL (ref 0.0–0.5)
Eosinophils Relative: 3 %
HCT: 26.1 % — ABNORMAL LOW (ref 39.0–52.0)
Hemoglobin: 7.5 g/dL — ABNORMAL LOW (ref 13.0–17.0)
Immature Granulocytes: 1 %
Lymphocytes Relative: 9 %
Lymphs Abs: 0.6 10*3/uL — ABNORMAL LOW (ref 0.7–4.0)
MCH: 23.6 pg — ABNORMAL LOW (ref 26.0–34.0)
MCHC: 28.7 g/dL — ABNORMAL LOW (ref 30.0–36.0)
MCV: 82.1 fL (ref 80.0–100.0)
Monocytes Absolute: 0.8 10*3/uL (ref 0.1–1.0)
Monocytes Relative: 11 %
Neutro Abs: 5.5 10*3/uL (ref 1.7–7.7)
Neutrophils Relative %: 76 %
Platelets: 175 10*3/uL (ref 150–400)
RBC: 3.18 MIL/uL — ABNORMAL LOW (ref 4.22–5.81)
RDW: 17.5 % — ABNORMAL HIGH (ref 11.5–15.5)
WBC: 7.3 10*3/uL (ref 4.0–10.5)
nRBC: 0 % (ref 0.0–0.2)

## 2019-08-06 LAB — COMPREHENSIVE METABOLIC PANEL
ALT: 18 U/L (ref 0–44)
AST: 23 U/L (ref 15–41)
Albumin: 3 g/dL — ABNORMAL LOW (ref 3.5–5.0)
Alkaline Phosphatase: 70 U/L (ref 38–126)
Anion gap: 7 (ref 5–15)
BUN: 35 mg/dL — ABNORMAL HIGH (ref 8–23)
CO2: 28 mmol/L (ref 22–32)
Calcium: 9.5 mg/dL (ref 8.9–10.3)
Chloride: 107 mmol/L (ref 98–111)
Creatinine, Ser: 1.28 mg/dL — ABNORMAL HIGH (ref 0.61–1.24)
GFR calc Af Amer: 56 mL/min — ABNORMAL LOW (ref >60)
GFR calc non Af Amer: 48 mL/min — ABNORMAL LOW (ref >60)
Glucose, Bld: 144 mg/dL — ABNORMAL HIGH (ref 70–99)
Potassium: 4.6 mmol/L (ref 3.5–5.1)
Sodium: 142 mmol/L (ref 135–145)
Total Bilirubin: 0.5 mg/dL (ref 0.3–1.2)
Total Protein: 5.8 g/dL — ABNORMAL LOW (ref 6.5–8.1)

## 2019-08-06 LAB — POC OCCULT BLOOD, ED: Fecal Occult Bld: NEGATIVE

## 2019-08-06 MED ORDER — SODIUM CHLORIDE 0.9 % IV SOLN
250.0000 mL | INTRAVENOUS | Status: DC | PRN
  Administered 2019-08-07: 250 mL via INTRAVENOUS

## 2019-08-06 MED ORDER — ACETAMINOPHEN 325 MG PO TABS: 650.0000 mg | ORAL_TABLET | Freq: Four times a day (QID) | ORAL | Status: DC | PRN

## 2019-08-06 MED ORDER — SODIUM CHLORIDE 0.9% FLUSH: 3.0000 mL | INTRAVENOUS | Status: DC | PRN

## 2019-08-06 MED ORDER — SODIUM CHLORIDE 0.9% FLUSH
3.0000 mL | Freq: Two times a day (BID) | INTRAVENOUS | Status: DC
  Administered 2019-08-07: 3 mL via INTRAVENOUS

## 2019-08-06 MED ORDER — ACETAMINOPHEN 650 MG RE SUPP: 650.0000 mg | Freq: Four times a day (QID) | RECTAL | Status: DC | PRN

## 2019-08-06 NOTE — ED Triage Notes (Signed)
Pt brought in by [facility] from home for unwitnessed fall tonight. Pt's wife heard pt fall. Pt is on blood thinners for afib. Per EMS pt wears O2 at home, wife unable to recall how much, pt placed on 2L. Pt denies LOC, dizziness, or SOB. Pt alert and oriented. Head laceration noted- bleeding semi controlled.

## 2019-08-07 DIAGNOSIS — E039 Hypothyroidism, unspecified: Secondary | ICD-10-CM

## 2019-08-07 DIAGNOSIS — Z952 Presence of prosthetic heart valve: Secondary | ICD-10-CM

## 2019-08-07 DIAGNOSIS — I10 Essential (primary) hypertension: Secondary | ICD-10-CM | POA: Diagnosis not present

## 2019-08-07 DIAGNOSIS — D509 Iron deficiency anemia, unspecified: Secondary | ICD-10-CM | POA: Diagnosis not present

## 2019-08-07 DIAGNOSIS — D649 Anemia, unspecified: Secondary | ICD-10-CM

## 2019-08-07 LAB — FERRITIN: Ferritin: 11 ng/mL — ABNORMAL LOW (ref 24–336)

## 2019-08-07 LAB — COMPREHENSIVE METABOLIC PANEL
ALT: 17 U/L (ref 0–44)
AST: 21 U/L (ref 15–41)
Albumin: 2.7 g/dL — ABNORMAL LOW (ref 3.5–5.0)
Alkaline Phosphatase: 58 U/L (ref 38–126)
Anion gap: 8 (ref 5–15)
BUN: 33 mg/dL — ABNORMAL HIGH (ref 8–23)
CO2: 26 mmol/L (ref 22–32)
Calcium: 9.1 mg/dL (ref 8.9–10.3)
Chloride: 110 mmol/L (ref 98–111)
Creatinine, Ser: 1.15 mg/dL (ref 0.61–1.24)
GFR calc Af Amer: >60 mL/min (ref >60)
GFR calc non Af Amer: 55 mL/min — ABNORMAL LOW (ref >60)
Glucose, Bld: 118 mg/dL — ABNORMAL HIGH (ref 70–99)
Potassium: 4.4 mmol/L (ref 3.5–5.1)
Sodium: 144 mmol/L (ref 135–145)
Total Bilirubin: 0.6 mg/dL (ref 0.3–1.2)
Total Protein: 5.2 g/dL — ABNORMAL LOW (ref 6.5–8.1)

## 2019-08-07 LAB — CBC
HCT: 22.9 % — ABNORMAL LOW (ref 39.0–52.0)
Hemoglobin: 6.6 g/dL — CL (ref 13.0–17.0)
MCH: 23.7 pg — ABNORMAL LOW (ref 26.0–34.0)
MCHC: 28.8 g/dL — ABNORMAL LOW (ref 30.0–36.0)
MCV: 82.4 fL (ref 80.0–100.0)
Platelets: 176 10*3/uL (ref 150–400)
RBC: 2.78 MIL/uL — ABNORMAL LOW (ref 4.22–5.81)
RDW: 17.5 % — ABNORMAL HIGH (ref 11.5–15.5)
WBC: 9.4 10*3/uL (ref 4.0–10.5)
nRBC: 0 % (ref 0.0–0.2)

## 2019-08-07 LAB — TROPONIN I (HIGH SENSITIVITY)
Troponin I (High Sensitivity): 23 ng/L — ABNORMAL HIGH (ref <18)
Troponin I (High Sensitivity): 23 ng/L — ABNORMAL HIGH (ref <18)

## 2019-08-07 LAB — HEMOGLOBIN AND HEMATOCRIT, BLOOD
HCT: 28.7 % — ABNORMAL LOW (ref 39.0–52.0)
Hemoglobin: 8.8 g/dL — ABNORMAL LOW (ref 13.0–17.0)

## 2019-08-07 LAB — VITAMIN B12: Vitamin B-12: 1287 pg/mL — ABNORMAL HIGH (ref 180–914)

## 2019-08-07 LAB — IRON AND TIBC
Iron: 13 ug/dL — ABNORMAL LOW (ref 45–182)
Saturation Ratios: 4 % — ABNORMAL LOW (ref 17.9–39.5)
TIBC: 308 ug/dL (ref 250–450)
UIBC: 295 ug/dL

## 2019-08-07 LAB — PREPARE RBC (CROSSMATCH)

## 2019-08-07 LAB — SARS CORONAVIRUS 2 (TAT 6-24 HRS): SARS Coronavirus 2: NEGATIVE

## 2019-08-07 MED ORDER — CARVEDILOL 3.125 MG PO TABS: 3.1250 mg | ORAL_TABLET | Freq: Every evening | ORAL | Status: DC

## 2019-08-07 MED ORDER — MELATONIN 3 MG PO TABS
3.0000 mg | ORAL_TABLET | Freq: Every evening | ORAL | Status: DC | PRN
  Administered 2019-08-07: 3 mg via ORAL
  Filled 2019-08-07 (×2): qty 1

## 2019-08-07 MED ORDER — CARVEDILOL 3.125 MG PO TABS
3.1250 mg | ORAL_TABLET | Freq: Every evening | ORAL | Status: DC
  Administered 2019-08-07: 3.125 mg via ORAL
  Filled 2019-08-07: qty 1

## 2019-08-07 MED ORDER — DIPHENHYDRAMINE HCL 25 MG PO CAPS
25.0000 mg | ORAL_CAPSULE | Freq: Once | ORAL | Status: AC
  Administered 2019-08-07: 25 mg via ORAL
  Filled 2019-08-07: qty 1

## 2019-08-07 MED ORDER — CLOPIDOGREL BISULFATE 75 MG PO TABS
75.0000 mg | ORAL_TABLET | Freq: Every day | ORAL | Status: DC
  Administered 2019-08-07: 75 mg via ORAL
  Filled 2019-08-07: qty 1

## 2019-08-07 MED ORDER — ACETAMINOPHEN 325 MG PO TABS
650.0000 mg | ORAL_TABLET | Freq: Once | ORAL | Status: AC
  Administered 2019-08-07: 650 mg via ORAL
  Filled 2019-08-07: qty 2

## 2019-08-07 MED ORDER — TAMSULOSIN HCL 0.4 MG PO CAPS
0.4000 mg | ORAL_CAPSULE | Freq: Every day | ORAL | Status: DC
  Administered 2019-08-07 – 2019-08-08 (×2): 0.4 mg via ORAL
  Filled 2019-08-07 (×2): qty 1

## 2019-08-07 MED ORDER — NON FORMULARY: 3.0000 mg | Freq: Every evening | Status: DC | PRN

## 2019-08-07 MED ORDER — SODIUM CHLORIDE 0.9% IV SOLUTION
Freq: Once | INTRAVENOUS | Status: AC
  Administered 2019-08-07: 06:00:00 via INTRAVENOUS

## 2019-08-07 MED ORDER — POLYVINYL ALCOHOL 1.4 % OP SOLN: 1.0000 [drp] | Freq: Three times a day (TID) | OPHTHALMIC | Status: DC | PRN

## 2019-08-07 MED ORDER — CARVEDILOL 3.125 MG PO TABS: 3.1250 mg | ORAL_TABLET | ORAL | Status: DC

## 2019-08-07 MED ORDER — PRAMIPEXOLE DIHYDROCHLORIDE 0.25 MG PO TABS
0.2500 mg | ORAL_TABLET | Freq: Every day | ORAL | Status: DC
  Administered 2019-08-07: 0.25 mg via ORAL
  Filled 2019-08-07 (×2): qty 1

## 2019-08-07 MED ORDER — SERTRALINE HCL 50 MG PO TABS
25.0000 mg | ORAL_TABLET | Freq: Every day | ORAL | Status: DC
  Administered 2019-08-07 – 2019-08-08 (×2): 25 mg via ORAL
  Filled 2019-08-07 (×2): qty 1

## 2019-08-07 MED ORDER — ASPIRIN EC 81 MG PO TBEC
81.0000 mg | DELAYED_RELEASE_TABLET | Freq: Every day | ORAL | Status: DC
  Administered 2019-08-07: 81 mg via ORAL
  Filled 2019-08-07: qty 1

## 2019-08-07 MED ORDER — FUROSEMIDE 10 MG/ML IJ SOLN: 20.0000 mg | Freq: Once | INTRAMUSCULAR | Status: DC

## 2019-08-07 MED ORDER — CARVEDILOL 6.25 MG PO TABS
6.2500 mg | ORAL_TABLET | Freq: Every morning | ORAL | Status: DC
  Administered 2019-08-07 – 2019-08-08 (×2): 6.25 mg via ORAL
  Filled 2019-08-07 (×2): qty 1

## 2019-08-07 MED ORDER — SODIUM CHLORIDE 0.9 % IV SOLN
510.0000 mg | Freq: Once | INTRAVENOUS | Status: AC
  Administered 2019-08-07: 510 mg via INTRAVENOUS
  Filled 2019-08-07: qty 17

## 2019-08-07 MED ORDER — LEVOTHYROXINE SODIUM 75 MCG PO TABS
75.0000 ug | ORAL_TABLET | Freq: Every day | ORAL | Status: DC
  Administered 2019-08-07 – 2019-08-08 (×2): 75 ug via ORAL
  Filled 2019-08-07 (×2): qty 1

## 2019-08-07 MED ORDER — FUROSEMIDE 20 MG PO TABS
20.0000 mg | ORAL_TABLET | Freq: Every day | ORAL | Status: DC
  Filled 2019-08-07: qty 1

## 2019-08-07 NOTE — Progress Notes (Signed)
Hair matted with blood upon arrival from scalp laceration. 7 staples observed.   Right lower back skin tear/redness, upper mid back skin tear, wart right ear lobe,  bruising across posterior shoulder bilateral, right inner knee bruise, healed abrasion right lower shin, plastic wedge between right first and second toe, bruising left upper arm (bicep), abrasion left elbow healed, excoriated mole left shoulder, right posterior achilles healed abrasion.   Paged Broadus John at 1502. ok to give Feraheme after blood transfusion? Transfusion will finish approx 1600. @1500  pt. brady to 36 then 82. Upon callback at 1504 informed to wait one hour after complete transfusion to give Feraheme in case of reaction. Informed MD patient has episodes where HR bradys down to 40's and 50's however returns to 60's and 70's.

## 2019-08-07 NOTE — ED Notes (Addendum)
This RN paged MD Maudie Mercury about critical hemoglobin value. MD called this RN back and said he would place orders for replacement units. Will await orders and administer units per order.

## 2019-08-07 NOTE — ED Notes (Signed)
Nasal swab   Collected and taken to the lab  COVID

## 2019-08-07 NOTE — ED Provider Notes (Addendum)
[facility] EMERGENCY DEPARTMENT Provider Note   CSN: [id number] Arrival date & time: 08/06/19  2131     History Chief Complaint  Patient presents with  . Fall  . Head Laceration    Raymond Swanson is a 85 y.o. male presenting for evaluation of fall and head laceration.  Patient states he does not know what caused him to fall, but that he falls frequently. Level V caveat due to pt being hard of hearing, limiting history.  I discussed with patient's wife.  She states she heard patient fall.  She immediately went over to him, he was alert and oriented.  He was bleeding from his head.  EMS was called and patient was brought to the ED.  Patient states he is on blood thinners, has been taking them as prescribed.  Additional history obtained from chart review, pt with h/o afib on plavix and ASA s/p TAVR in 04/2019, CAS, HTN, HLD, hypothyroidism, h/o prostate cancer, subdural hematoma s/p fall.   HPI     Past Medical History:  Diagnosis Date  . Acute gastric ulcer with hemorrhage, without mention of obstruction 09/10/2009  . ANEMIA 10/08/2009  . CORONARY ARTERY DISEASE 01/26/2007   s/p remote CABG x3V  . Dyspnea   . History of kidney stones   . HOH (hard of hearing)   . Hx of bladder cancer    s/p TURBT by Dr. Lovena Neighbours  . HYPERLIPIDEMIA 01/26/2007  . HYPERTENSION 01/26/2007  . HYPOTHYROIDISM 10/18/2007  . PROSTATE CANCER, HX OF 01/26/2007  . SKIN CANCER, HX OF 01/26/2007  . Subdural hematoma ([place]) 2016   fall from deck while cleaning gutters; sustained subdemral hematoma ; doing ok now     Patient Active Problem List   Diagnosis Date Noted  . Syncope 08/06/2019  . Acute on chronic diastolic heart failure (Raymond) 05/24/2019  . S/P TAVR (transcatheter aortic valve replacement) 05/23/2019  . NSVT (nonsustained ventricular tachycardia) ([place]) 02/09/2019  . Severe aortic stenosis 11/17/2017  . Malignant neoplasm of posterior wall of urinary bladder ([place])   . Restless  leg syndrome, controlled 04/27/2017  . T8 vertebral fracture ([place]) 01/27/2016  . L1 vertebral fracture ([place]) 01/27/2016  . Lumbar transverse process fracture ([place]) 01/27/2016  . Pressure ulcer 01/26/2016  . ANEMIA 10/08/2009  . ACUT GASTR ULCER W/HEMORR W/O MENTION OBST 09/10/2009  . Hypothyroidism 10/18/2007  . Dyslipidemia 01/26/2007  . Essential hypertension 01/26/2007  . Hx of CABG 01/26/2007  . PROSTATE CANCER, HX OF 01/26/2007    Past Surgical History:  Procedure Laterality Date  . CATARACT EXTRACTION    . CORONARY ARTERY BYPASS GRAFT     1991; reports at PAT appt 10-11-17: i went to my priamry doctor for a physical and had aroutine stress test that was bad, was sent for cath ( more than 1 but nsure how many or if stents were placed), he rprots " that didnt work so they did the surgery". denies heart symptoms for over 20 years;  sees  cardiology Hochrein annually for EKGs   . CYSTOSCOPY W/ URETERAL STENT PLACEMENT Right 10/13/2017   Procedure: CYSTOSCOPY WITH STENT REPLACEMENT;  Surgeon: Ceasar Mons, MD;  Location: [facility];  Service: Urology;  Laterality: Right;  . EXCISION MASS UPPER EXTREMETIES Left 08/11/2016   Procedure: EXCISION MASS LEFT SHOULDER, LEFT FOREARM, LEFT WRIST;  Surgeon: Judeth Horn, MD;  Location: [facility];  Service: General;  Laterality: Left;  . HERNIA REPAIR     ingunial  . MOHS  SURGERY    . PROSTATE SURGERY     prostatectomy  . RIGHT/LEFT HEART CATH AND CORONARY/GRAFT ANGIOGRAPHY N/A 04/28/2019   Procedure: RIGHT/LEFT HEART CATH AND CORONARY/GRAFT ANGIOGRAPHY;  Surgeon: Sherren Mocha, MD;  Location: [facility];  Service: Cardiovascular;  Laterality: N/A;  . TEE WITHOUT CARDIOVERSION N/A 11/17/2017   Procedure: TRANSESOPHAGEAL ECHOCARDIOGRAM (TEE);  Surgeon: Dixie Dials, MD;  Location: [facility] ENDOSCOPY;  Service: Cardiovascular;  Laterality: N/A;  . TEE WITHOUT CARDIOVERSION N/A 05/23/2019   Procedure: TRANSESOPHAGEAL ECHOCARDIOGRAM (TEE);   Surgeon: Sherren Mocha, MD;  Location: [place] [facility];  Service: Open Heart Surgery;  Laterality: N/A;  . TRANSCATHETER AORTIC VALVE REPLACEMENT, TRANSFEMORAL N/A 05/23/2019   Procedure: TRANSCATHETER AORTIC VALVE REPLACEMENT, TRANSFEMORAL;  Surgeon: Sherren Mocha, MD;  Location: [place] [facility];  Service: Open Heart Surgery;  Laterality: N/A;  . TRANSURETHRAL RESECTION OF BLADDER TUMOR N/A 10/13/2017   Procedure: TRANSURETHRAL RESECTION OF BLADDER TUMOR/ BIPOLAR (TURBT);  Surgeon: Ceasar Mons, MD;  Location: [facility];  Service: Urology;  Laterality: N/A;  ONLY NEEDS 90 MIN FOR BOTH PROCEDURES       Family History  Problem Relation Age of Onset  . Lung cancer Brother 26  . GI problems Sister     Social History   Tobacco Use  . Smoking status: Former Smoker    Packs/day: 1.00    Years: 20.00    Pack years: 20.00    Types: Cigarettes    Quit date: 10/28/1948    Years since quitting: 70.8  . Smokeless tobacco: Never Used  Substance Use Topics  . Alcohol use: Not Currently    Comment: 4 oz. wine daily  . Drug use: No    Home Medications Prior to Admission medications   Medication Sig Start Date End Date Taking? Authorizing Provider  carvedilol (COREG) 3.125 MG tablet Take 3.125-6.25 mg by mouth See admin instructions. Take 6.25 mg by mouth in the morning and 3.125 mg by mouth in the evening    Yes [provider]  acetaminophen (TYLENOL) 500 MG tablet Take 500-1,000 mg by mouth every 6 (six) hours as needed for moderate pain or headache.    [provider]  aspirin EC 81 MG tablet Take 81 mg by mouth at bedtime.     [provider]  CALCIUM PO Take 1 capsule by mouth daily.    [provider]  carboxymethylcellulose (REFRESH TEARS) 0.5 % SOLN Place 1 drop into both eyes 3 (three) times daily as needed (dry eyes).    [provider]  clopidogrel (PLAVIX) 75 MG tablet Take 1 tablet (75 mg total) by mouth daily with  breakfast. 05/25/19   Eileen Stanford, PA-C  Cyanocobalamin (B-12 PO) Take 1 capsule by mouth daily.    [provider]  furosemide (LASIX) 20 MG tablet Take 20 mg by mouth daily.    [provider]  levothyroxine (SYNTHROID) 75 MCG tablet Take 75 mcg by mouth daily before breakfast.    [provider]  MELATONIN PO Take 1 tablet by mouth at bedtime.     [provider]  Multiple Vitamin (MULTIVITAMIN WITH MINERALS) TABS tablet Take 1 tablet by mouth daily.    [provider]  Multiple Vitamins-Minerals (PRESERVISION AREDS 2 PO) Take 1 tablet by mouth 2 (two) times daily.     [provider]  nitroGLYCERIN (NITROSTAT) 0.4 MG SL tablet Place 0.4 mg under the tongue every 5 (five) minutes as needed for chest pain.  [provider]  pramipexole (MIRAPEX) 0.25 MG tablet Take 0.25 mg by mouth at bedtime. 08/01/19   [provider]  sertraline (ZOLOFT) 50 MG tablet Take 0.5 tablets (25 mg total) by mouth daily. 07/27/19 09/25/19  Isaac Bliss, Rayford Halsted, MD  tamsulosin (FLOMAX) 0.4 MG CAPS capsule Take 0.4 mg by mouth daily.    [provider]  VITAMIN D PO Take 1 capsule by mouth daily.    [provider]    Allergies    Patient has no known allergies.  Review of Systems   Review of Systems  Skin: Positive for wound.  Hematological: Bruises/bleeds easily.  All other systems reviewed and are negative.   Physical Exam Updated Vital Signs BP 109/66   Pulse 61   Resp 14   SpO2 (!) 86%   Physical Exam Vitals and nursing note reviewed. Exam conducted with a chaperone present.  Constitutional:      General: He is not in acute distress.    Appearance: He is well-developed.     Comments: Appears pal, nontoxic.   HENT:     Head: Normocephalic.      Comments: 3 cm constantly oozing laceration with underlying hematoma.  No pulsatile bleed.  No nasal septal hematoma.  No battle sign or raccoon  eyes. Eyes:     Extraocular Movements: Extraocular movements intact.     Conjunctiva/sclera: Conjunctivae normal.     Pupils: Pupils are equal, round, and reactive to light.     Comments: EOMI and PERRLA.   Neck:     Comments: No ttp of midline cpsine Cardiovascular:     Rate and Rhythm: Normal rate and regular rhythm.  Pulmonary:     Effort: Pulmonary effort is normal. No respiratory distress.     Breath sounds: Normal breath sounds. No wheezing.  Abdominal:     General: There is no distension.     Palpations: Abdomen is soft. There is no mass.     Tenderness: There is no abdominal tenderness. There is no guarding or rebound.  Genitourinary:    Comments: No black or tarry stools.  Hemoccult negative. Musculoskeletal:        General: Normal range of motion.     Cervical back: Normal range of motion and neck supple.  Skin:    General: Skin is warm and dry.  Neurological:     Mental Status: He is alert and oriented to person, place, and time.     ED Results / Procedures / Treatments   Labs (all labs ordered are listed, but only abnormal results are displayed) Labs Reviewed  CBC WITH DIFFERENTIAL/PLATELET - Abnormal; Notable for the following components:      Result Value   RBC 3.18 (*)    Hemoglobin 7.5 (*)    HCT 26.1 (*)    MCH 23.6 (*)    MCHC 28.7 (*)    RDW 17.5 (*)    Lymphs Abs 0.6 (*)    All other components within normal limits  COMPREHENSIVE METABOLIC PANEL - Abnormal; Notable for the following components:   Glucose, Bld 144 (*)    BUN 35 (*)    Creatinine, Ser 1.28 (*)    Total Protein 5.8 (*)    Albumin 3.0 (*)    GFR calc non Af Amer 48 (*)    GFR calc Af Amer 56 (*)    All other components within normal limits  SARS CORONAVIRUS 2 (TAT 6-24 HRS)  COMPREHENSIVE METABOLIC PANEL  CBC  POC OCCULT BLOOD, ED  TROPONIN I (HIGH SENSITIVITY)    EKG EKG Interpretation  Date/Time:  Sunday August 06 2019 21:45:31 EST Ventricular Rate:  69 PR  Interval:    QRS Duration: 96 QT Interval:  381 QTC Calculation: 409 R Axis:   77 Text Interpretation: Sinus rhythm Atrial premature complexes in couplets Borderline repol abnormality, diffuse leads When compared to prior, appearence of u waves. NO STEMI Confirmed by Antony Blackbird [phone]) on 08/06/2019 9:47:07 PM   Radiology CT Head Wo Contrast  Result Date: 08/06/2019 CLINICAL DATA:  85 year old post unwitnessed fall. On anticoagulation. History of subdural hematoma. EXAM: CT HEAD WITHOUT CONTRAST CT CERVICAL SPINE WITHOUT CONTRAST TECHNIQUE: Multidetector CT imaging of the head and cervical spine was performed following the standard protocol without intravenous contrast. Multiplanar CT image reconstructions of the cervical spine were also generated. COMPARISON:  Head CT 01/22/2016 FINDINGS: CT HEAD FINDINGS Brain: No intracranial hemorrhage, mass effect, or midline shift. Age related atrophy. Moderate chronic small vessel ischemia. Right frontal encephalomalacia in the region of prior hemorrhage. No hydrocephalus. The basilar cisterns are patent. No evidence of territorial infarct or acute ischemia. No extra-axial or intracranial fluid collection. Vascular: Atherosclerosis of skullbase vasculature without hyperdense vessel or abnormal calcification. Skull: No fracture or focal lesion. Sinuses/Orbits: Paranasal sinuses and mastoid air cells are clear. The visualized orbits are unremarkable. Bilateral cataract resection. Other: Posterior parietal scalp hematoma with overlying dressing in place. CT CERVICAL SPINE FINDINGS Alignment: No traumatic subluxation. Trace anterolisthesis of C7 on T1 appears degenerative. Skull base and vertebrae: No acute fracture. Cervical vertebral body heights are maintained. Minimal anterior wedging of T2 vertebral body is chronic and unchanged from prior exam. The dens and skull base are intact. Soft tissues and spinal canal: No prevertebral fluid or swelling. No visible  canal hematoma. Disc levels: Degenerative disc disease is most prominent at C5-C6. Multilevel facet hypertrophy. Upper chest: Emphysema. No acute findings. Other: Advanced carotid calcifications. IMPRESSION: 1. Posterior parietal scalp hematoma with overlying dressing in place. No acute intracranial abnormality. No skull fracture. 2. Age related atrophy and chronic small vessel ischemia. Right frontal encephalomalacia from prior hemorrhage. 3. Degenerative change in the cervical spine without acute fracture or subluxation. 4. Carotid and skull base atherosclerosis. Emphysema noted in the lung apices. Emphysema (ICD10-J43.9). Electronically Signed   By: Keith Rake M.D.   On: 08/06/2019 22:47   CT Cervical Spine Wo Contrast  Result Date: 08/06/2019 CLINICAL DATA:  85 year old post unwitnessed fall. On anticoagulation. History of subdural hematoma. EXAM: CT HEAD WITHOUT CONTRAST CT CERVICAL SPINE WITHOUT CONTRAST TECHNIQUE: Multidetector CT imaging of the head and cervical spine was performed following the standard protocol without intravenous contrast. Multiplanar CT image reconstructions of the cervical spine were also generated. COMPARISON:  Head CT 01/22/2016 FINDINGS: CT HEAD FINDINGS Brain: No intracranial hemorrhage, mass effect, or midline shift. Age related atrophy. Moderate chronic small vessel ischemia. Right frontal encephalomalacia in the region of prior hemorrhage. No hydrocephalus. The basilar cisterns are patent. No evidence of territorial infarct or acute ischemia. No extra-axial or intracranial fluid collection. Vascular: Atherosclerosis of skullbase vasculature without hyperdense vessel or abnormal calcification. Skull: No fracture or focal lesion. Sinuses/Orbits: Paranasal sinuses and mastoid air cells are clear. The visualized orbits are unremarkable. Bilateral cataract resection. Other: Posterior parietal scalp hematoma with overlying dressing in place. CT CERVICAL SPINE FINDINGS  Alignment: No traumatic subluxation. Trace anterolisthesis of C7 on T1 appears degenerative. Skull base and vertebrae: No acute fracture. Cervical vertebral body heights are  maintained. Minimal anterior wedging of T2 vertebral body is chronic and unchanged from prior exam. The dens and skull base are intact. Soft tissues and spinal canal: No prevertebral fluid or swelling. No visible canal hematoma. Disc levels: Degenerative disc disease is most prominent at C5-C6. Multilevel facet hypertrophy. Upper chest: Emphysema. No acute findings. Other: Advanced carotid calcifications. IMPRESSION: 1. Posterior parietal scalp hematoma with overlying dressing in place. No acute intracranial abnormality. No skull fracture. 2. Age related atrophy and chronic small vessel ischemia. Right frontal encephalomalacia from prior hemorrhage. 3. Degenerative change in the cervical spine without acute fracture or subluxation. 4. Carotid and skull base atherosclerosis. Emphysema noted in the lung apices. Emphysema (ICD10-J43.9). Electronically Signed   By: Keith Rake M.D.   On: 08/06/2019 22:47    Procedures .Marland KitchenLaceration Repair  Date/Time: 08/07/2019 12:17 AM Performed by: Franchot Heidelberg, PA-C Authorized by: Franchot Heidelberg, PA-C   Consent:    Consent obtained:  Verbal   Consent given by:  Patient   Risks discussed:  Infection, pain, retained foreign body, tendon damage, vascular damage, poor cosmetic result, poor wound healing, need for additional repair and nerve damage Laceration details:    Location:  Scalp   Scalp location:  Occipital   Length (cm):  3   Depth (mm):  4 Repair type:    Repair type:  Simple Pre-procedure details:    Preparation:  Patient was prepped and draped in usual sterile fashion and imaging obtained to evaluate for foreign bodies Exploration:    Wound exploration: wound explored through full range of motion and entire depth of wound probed and visualized     Wound extent: no  underlying fracture noted   Skin repair:    Repair method:  Staples   Number of staples:  7 Post-procedure details:    Dressing:  Open (no dressing)   Patient tolerance of procedure:  Tolerated well, no immediate complications  .Critical Care Performed by: Franchot Heidelberg, PA-C Authorized by: Franchot Heidelberg, PA-C   Critical care provider statement:    Critical care time (minutes):  35   Critical care time was exclusive of:  Separately billable procedures and treating other patients and teaching time   Critical care was necessary to treat or prevent imminent or life-threatening deterioration of the following conditions:  Circulatory failure   Critical care was time spent personally by me on the following activities:  Blood draw for specimens, development of treatment plan with patient or surrogate, evaluation of patient's response to treatment, examination of patient, obtaining history from patient or surrogate, ordering and performing treatments and interventions, ordering and review of laboratory studies, ordering and review of radiographic studies, pulse oximetry, re-evaluation of patient's condition and review of old charts   I assumed direction of critical care for this patient from another provider in my specialty: no   Comments:     Pt with anemia requiring admission   (including critical care time)  Medications Ordered in ED Medications  sodium chloride flush (NS) 0.9 % injection 3 mL (has no administration in time range)  sodium chloride flush (NS) 0.9 % injection 3 mL (has no administration in time range)  0.9 %  sodium chloride infusion (has no administration in time range)  acetaminophen (TYLENOL) tablet 650 mg (has no administration in time range)    Or  acetaminophen (TYLENOL) suppository 650 mg (has no administration in time range)    ED Course  I have reviewed the triage vital signs and the nursing notes.  Pertinent labs & imaging results that were available  during my care of the patient were reviewed by me and considered in my medical decision making (see chart for details).    MDM Rules/Calculators/A&P                       Patient presenting for evaluation after fall.  Physical exam shows elderly male with constantly oozing scalp lac.  Vitals are stable.  As event was unwitnessed, will obtain basic labs and EKG.  CT head and neck obtained for further evaluation.  CT head negative for intracranial bleed. Ct c-spine negative.  Case discussed with attending, Dr. tegeler evaluated the patient.  Laceration repaired as described above.  EKG with U waves, but no STEMI.  Labs show worsening anemia at 7.5.  In the setting of head laceration with blood loss tonight, I am concerned that hemoglobin may continue to drop.  As such, will admit for hemoglobin trending.  As he is stable at this time, will hold on blood transfusion.  Case discussed with Dr. Maudie Mercury from [facility], patient to be admitted.  Final Clinical Impression(s) / ED Diagnoses Final diagnoses:  Laceration of scalp without foreign body, initial encounter  Anemia, unspecified type    Rx / DC Orders ED Discharge Orders    None       Franchot Heidelberg, PA-C 08/07/19 0020    Franchot Heidelberg, PA-C 08/07/19 1252    Tegeler, Gwenyth Allegra, MD 08/09/19 [place], Domonique Brouillard, PA-C 08/14/19 [id number]    Tegeler, Gwenyth Allegra, MD 08/14/19 1743

## 2019-08-07 NOTE — ED Notes (Addendum)
This RN noticed bleeding from laceration on back of pt's head, ABD bandage applied

## 2019-08-07 NOTE — Progress Notes (Addendum)
Patient seen and examined, admitted earlier this morning by Dr. Maudie Mercury -This is a 85 year old male who is extremely hard of hearing, history of CAD/CABG, aortic stenosis status post TAVR in 04/2019, prostate cancer, hypertension, dyslipidemia, PUD, subdural hematoma, history of spinal compression fractures presented to the ED with weakness and fall, sustained a scalp laceration. -He was found to have severe iron deficiency anemia with heme-negative stools -CT head noted posterior parietal scalp hematoma  1.  Severe iron deficiency anemia/fall -Baseline hemoglobin around 9.8-10 in September 2020 when he had his TAVR, was started on Plavix then, hb dropped to 6.7 now -Hemoccult negative, patient denies overt bleeding -Transfused 2 units of PRBC,, will give IV iron x1 now -Patient unable to recall when his last colonoscopy was -Last EGD 08/2009 by Dr. Carlean Purl noted gastric ulcer and erosions -Gastroenterology consulted to determine if inpatient work-up is warranted -I also discussed with cardiology, they are okay with stopping Plavix since he is 3 months out from TAVR  2.  History of severe AS status post recent TAVR in 9/20 -On aspirin and Plavix, Plavix stopped now, see discussion above  3.  Fall, scalp hematoma Laceration repair in the ED, treated with 7 staples  Severe hearing loss  Rest as noted by Dr. Maudie Mercury this morning  Domenic Polite, MD

## 2019-08-07 NOTE — ED Notes (Signed)
Breakfast Ordered 

## 2019-08-07 NOTE — Evaluation (Signed)
Physical Therapy Evaluation Patient Details Name: Raymond Swanson MRN: [id number] DOB: 1925-03-07 Today's Date: 08/07/2019   History of Present Illness  This is a 85 year old male who is extremely hard of hearing, history of CAD/CABG, aortic stenosis status post TAVR in 04/2019, prostate cancer, hypertension, dyslipidemia, PUD, subdural hematoma, history of spinal compression fractures presented to the ED with weakness and fall, sustained a scalp laceration.  Found to have sever anemia and was transfused two units of blood today.  Clinical Impression  Patient presents with decreased mobility due to deficits listed in PT problem list.  Briefly, he falls at home a lot, but either not to floor or not with injury most of the time.  He lives with his wife who mobilizes well, but has dementia and won't get help.  She has turned away help as she doesn't want people in their home, but patient is struggling and needs assistance to get more help in the home.  Able to ambulate today with close S to minguard on turns (his walker has swivel wheels).  Feel he will need follow up HHPT/aide and Education officer, museum.  PT to follow acutely.     Follow Up Recommendations Home health PT(HH aide/social worker)    Equipment Recommendations  None recommended by PT    Recommendations for Other Services       Precautions / Restrictions Precautions Precautions: Fall Precaution Comments: about a fall a week per pt (not clear if falls to ground or trips and catches himself)      Mobility  Bed Mobility Overal bed mobility: Modified Independent                Transfers Overall transfer level: Needs assistance Equipment used: Rolling walker (2 wheeled) Transfers: Sit to/from Stand Sit to Stand: Supervision         General transfer comment: elevated bed height as in low bed  Ambulation/Gait Ambulation/Gait assistance: Min guard;Supervision Gait Distance (Feet): 170 Feet Assistive device: Rolling walker (2  wheeled) Gait Pattern/deviations: Step-through pattern;Decreased stride length;Trunk flexed     General Gait Details: turning walker with increased time and assist due to getting out of center (his walker has swivel wheels); HR stable throughout, some dyspnea, about 2/4; reports doesn't walk that far usually, feels TAVR didn't help  Stairs            Wheelchair Mobility    Modified Rankin (Stroke Patients Only)       Balance Overall balance assessment: Needs assistance Sitting-balance support: Feet supported;No upper extremity supported Sitting balance-Leahy Scale: Good     Standing balance support: Bilateral upper extremity supported Standing balance-Leahy Scale: Poor Standing balance comment: UE support for balance; no LOB, but some walker safety issues                             Pertinent Vitals/Pain Pain Assessment: Faces Faces Pain Scale: Hurts little more Pain Location: generalized Pain Descriptors / Indicators: Discomfort Pain Intervention(s): Monitored during session;Repositioned    Home Living Family/patient expects to be discharged to:: Private residence Living Arrangements: Spouse/significant other Available Help at Discharge: Family Type of Home: House Home Access: Stairs to enter   Technical brewer of Steps: 2 Home Layout: One level Home Equipment: Environmental consultant - 2 wheels;Cane - single point;Bedside commode;Grab bars - tub/shower      Prior Function Level of Independence: Independent with assistive device(s)   Gait / Transfers Assistance Needed: walks with RW  ADL's /  Homemaking Assistance Needed: evidently had an aide (per noted) but denies having one now        Hand Dominance        Extremity/Trunk Assessment   Upper Extremity Assessment Upper Extremity Assessment: Generalized weakness    Lower Extremity Assessment Lower Extremity Assessment: Generalized weakness    Cervical / Trunk Assessment Cervical / Trunk  Assessment: Kyphotic;Other exceptions Cervical / Trunk Exceptions: significant kyphoscoliosis  Communication   Communication: HOH(used white board to communicate)  Cognition Arousal/Alertness: Awake/alert Behavior During Therapy: WFL for tasks assessed/performed Overall Cognitive Status: Within Functional Limits for tasks assessed                                 General Comments: Expressed concern over his relationship with his wife who has not been allowing help in the home and has dementia and refuses to go to the doctor, he is very concerned about her as she is home alone right now.  states would like to go into ALF if not for Covid.  Also states has lists of home care agencies, but is unable to ste this up on his own.      General Comments General comments (skin integrity, edema, etc.): entire scalp region covered in dark old blood and multiple areas of excoriation on arms/elbows    Exercises     Assessment/Plan    PT Assessment Patient needs continued PT services  PT Problem List Decreased strength;Decreased activity tolerance;Decreased mobility;Decreased balance;Decreased knowledge of precautions;Decreased knowledge of use of DME;Decreased safety awareness       PT Treatment Interventions DME instruction;Therapeutic activities;Balance training;Patient/family education;Therapeutic exercise;Functional mobility training;Gait training;Stair training    PT Goals (Current goals can be found in the Care Plan section)  Acute Rehab PT Goals Patient Stated Goal: to get help and get help for his wife PT Goal Formulation: With patient Time For Goal Achievement: 08/21/19 Potential to Achieve Goals: Good    Frequency Min 3X/week   Barriers to discharge        Co-evaluation               AM-PAC PT "6 Clicks" Mobility  Outcome Measure Help needed turning from your back to your side while in a flat bed without using bedrails?: None Help needed moving from lying  on your back to sitting on the side of a flat bed without using bedrails?: A Little Help needed moving to and from a bed to a chair (including a wheelchair)?: A Little Help needed standing up from a chair using your arms (e.g., wheelchair or bedside chair)?: None Help needed to walk in hospital room?: A Little Help needed climbing 3-5 steps with a railing? : A Little 6 Click Score: 20    End of Session   Activity Tolerance: Patient limited by fatigue Patient left: in bed;with call bell/phone within reach;with bed alarm set   PT Visit Diagnosis: Other abnormalities of gait and mobility (R26.89);Repeated falls (R29.6)    Time: [id number] PT Time Calculation (min) (ACUTE ONLY): 40 min   Charges:   PT Evaluation $PT Eval High Complexity: 1 High PT Treatments $Gait Training: 8-22 mins $Self Care/Home Management: [place] [phone] 08/07/2019   Reginia Naas 08/07/2019, 5:23 PM

## 2019-08-07 NOTE — Progress Notes (Signed)
Raymond Swanson is a 85 y.o. male patient admitted from ED awake, alert - oriented  X 4 - no acute distress noted.  VSS - Blood pressure (!) 102/46, pulse (!) 59, temperature 97.7 F (36.5 C), temperature source Oral, resp. rate 16, height 5\' 8"  (1.727 m), weight 53.5 kg, SpO2 96 %.    IV in place, occlusive dsg intact without redness.  Orientation to room, and floor completed with information packet given to patient/family.  Patient declined safety video at this time.  Admission INP armband ID verified with patient/family, and in place.    SR up x 2, fall assessment complete, with patient and family able to verbalize understanding of risk associated with falls, and verbalized understanding to call nsg before up out of bed. Patient on low bed.   Call light within reach, patient able to voice, and demonstrate understanding.    Skin to skin completed with second RN. skin integrity documented.   No evidence of skin break down noted on exam.     Will cont to eval and treat per MD orders.  Howard Pouch, RN 08/07/2019 3:08 PM

## 2019-08-07 NOTE — Consult Note (Signed)
Referring Provider:  Dr. Broadus John, [facility] Primary Care Physician:  Isaac Bliss, Rayford Halsted, MD Primary Gastroenterologist:  Dr. Carlean Purl  Reason for Consultation:  IDA  HPI: Raymond Swanson is a 85 y.o. male with hypothyroidism, h/o spinal compression fractures, h/o prostate cancer,  hypertension, hyperlipidemia, AAA, CAD s/p CABG, h/o Aortic stenosis, s/p TAVR, h/o Subdural hematoma, h/o PUD, anemia who presented to [facility] ED after a fall at home.  Hgb found to be 7.5 grams initially then down to 6.6 grams as compared to 9.7 grams one month and and 10.2 grams two months ago.  Ferritin 11, serum iron 13, iron saturation 4.  He is actually FOBT negative.  He has received 2 units PRBC's and is going to receive a dose of IV iron.  He denies black or bloody stools at home.  No issues with moving his bowels.  No abdominal pain.  Patient is extremely HOH so I spent an extensive amount of time communicating with him via writing.  He reports colonoscopy many, many years ago.  EGD 08/2009 showed the following:   ENDOSCOPIC IMPRESSION:     1) 1 cm ulcer in the antrum - biopsied     2) Erosions, multiple in the antrum     3) Otherwise normal examination           Overall picture consistent with ulcer from ASA (taking 325 mg     daily).  He was on Plavix at home, but cardiology has given the ok to discontinue that and is only recommending a daily 81 mg ASA.   Past Medical History:  Diagnosis Date  . Acute gastric ulcer with hemorrhage, without mention of obstruction 09/10/2009  . ANEMIA 10/08/2009  . CORONARY ARTERY DISEASE 01/26/2007   s/p remote CABG x3V  . Dyspnea   . History of kidney stones   . HOH (hard of hearing)   . Hx of bladder cancer    s/p TURBT by Dr. Lovena Neighbours  . HYPERLIPIDEMIA 01/26/2007  . HYPERTENSION 01/26/2007  . HYPOTHYROIDISM 10/18/2007  . PROSTATE CANCER, HX OF 01/26/2007  . SKIN CANCER, HX OF 01/26/2007  . Subdural hematoma ([place]) 2016   fall from deck while cleaning gutters; sustained  subdemral hematoma ; doing ok now     Past Surgical History:  Procedure Laterality Date  . CATARACT EXTRACTION    . CORONARY ARTERY BYPASS GRAFT     1991; reports at PAT appt 10-11-17: i went to my priamry doctor for a physical and had aroutine stress test that was bad, was sent for cath ( more than 1 but nsure how many or if stents were placed), he rprots " that didnt work so they did the surgery". denies heart symptoms for over 20 years;  sees  cardiology Hochrein annually for EKGs   . CYSTOSCOPY W/ URETERAL STENT PLACEMENT Right 10/13/2017   Procedure: CYSTOSCOPY WITH STENT REPLACEMENT;  Surgeon: Ceasar Mons, MD;  Location: [facility];  Service: Urology;  Laterality: Right;  . EXCISION MASS UPPER EXTREMETIES Left 08/11/2016   Procedure: EXCISION MASS LEFT SHOULDER, LEFT FOREARM, LEFT WRIST;  Surgeon: Judeth Horn, MD;  Location: [place];  Service: General;  Laterality: Left;  . HERNIA REPAIR     ingunial  . MOHS SURGERY    . PROSTATE SURGERY     prostatectomy  . RIGHT/LEFT HEART CATH AND CORONARY/GRAFT ANGIOGRAPHY N/A 04/28/2019   Procedure: RIGHT/LEFT HEART CATH AND CORONARY/GRAFT ANGIOGRAPHY;  Surgeon: Sherren Mocha, MD;  Location: [place] [facility];  Service: Cardiovascular;  Laterality: N/A;  . TEE WITHOUT CARDIOVERSION N/A 11/17/2017   Procedure: TRANSESOPHAGEAL ECHOCARDIOGRAM (TEE);  Surgeon: Dixie Dials, MD;  Location[facility];  Service: Cardiovascular;  Laterality: N/A;  . TEE WITHOUT CARDIOVERSION N/A 05/23/2019   Procedure: TRANSESOPHAGEAL ECHOCARDIOGRAM (TEE);  Surgeon: Sherren Mocha, MD;  Location: [place] CV LAB;  Service: Open Heart Surgery;  Laterality: N/A;  . TRANSCATHETER AORTIC VALVE REPLACEMENT, TRANSFEMORAL N/A 05/23/2019   Procedure: TRANSCATHETER AORTIC VALVE REPLACEMENT, TRANSFEMORAL;  Surgeon: Sherren Mocha, MD;  Location: [place] [facility];  Service: Open Heart Surgery;  Laterality: N/A;  . TRANSURETHRAL RESECTION OF BLADDER TUMOR N/A 10/13/2017    Procedure: TRANSURETHRAL RESECTION OF BLADDER TUMOR/ BIPOLAR (TURBT);  Surgeon: Ceasar Mons, MD;  Location: [facility];  Service: Urology;  Laterality: N/A;  ONLY NEEDS 90 MIN FOR BOTH PROCEDURES    Prior to Admission medications   Medication Sig Start Date End Date Taking? Authorizing Provider  carvedilol (COREG) 3.125 MG tablet Take 3.125-6.25 mg by mouth See admin instructions. Take 6.25 mg by mouth in the morning and 3.125 mg by mouth in the evening    Yes [provider]  acetaminophen (TYLENOL) 500 MG tablet Take 500-1,000 mg by mouth every 6 (six) hours as needed for moderate pain or headache.    [provider]  aspirin EC 81 MG tablet Take 81 mg by mouth at bedtime.     [provider]  CALCIUM PO Take 1 capsule by mouth daily.    [provider]  carboxymethylcellulose (REFRESH TEARS) 0.5 % SOLN Place 1 drop into both eyes 3 (three) times daily as needed (dry eyes).    [provider]  clopidogrel (PLAVIX) 75 MG tablet Take 1 tablet (75 mg total) by mouth daily with breakfast. 05/25/19   Eileen Stanford, PA-C  Cyanocobalamin (B-12 PO) Take 1 capsule by mouth daily.    [provider]  furosemide (LASIX) 20 MG tablet Take 20 mg by mouth daily.    [provider]  levothyroxine (SYNTHROID) 75 MCG tablet Take 75 mcg by mouth daily before breakfast.    [provider]  MELATONIN PO Take 1 tablet by mouth at bedtime.     [provider]  Multiple Vitamin (MULTIVITAMIN WITH MINERALS) TABS tablet Take 1 tablet by mouth daily.    [provider]  Multiple Vitamins-Minerals (PRESERVISION AREDS 2 PO) Take 1 tablet by mouth 2 (two) times daily.     [provider]  nitroGLYCERIN (NITROSTAT) 0.4 MG SL tablet Place 0.4 mg under the tongue every 5 (five) minutes as needed for chest pain.    [provider]  pramipexole (MIRAPEX) 0.25 MG tablet Take 0.25 mg by mouth at bedtime.  08/01/19   [provider]  sertraline (ZOLOFT) 50 MG tablet Take 0.5 tablets (25 mg total) by mouth daily. 07/27/19 09/25/19  Isaac Bliss, Rayford Halsted, MD  tamsulosin (FLOMAX) 0.4 MG CAPS capsule Take 0.4 mg by mouth daily.    [provider]  VITAMIN D PO Take 1 capsule by mouth daily.    [provider]    Current Facility-Administered Medications  Medication Dose Route Frequency Provider Last Rate Last Admin  . 0.9 %  sodium chloride infusion  250 mL Intravenous PRN Jani Gravel, MD      . acetaminophen (TYLENOL) tablet 650 mg  650 mg Oral Q6H PRN Jani Gravel, MD       Or  . acetaminophen (TYLENOL) suppository 650 mg  650 mg Rectal Q6H PRN Jani Gravel, MD      . aspirin EC tablet 81 mg  81 mg Oral QHS Jani Gravel, MD      . carvedilol (COREG) tablet 6.25 mg  6.25 mg Oral q morning - 10a Domenic Polite, MD   6.25 mg at 08/07/19 [id number]   And  . carvedilol (COREG) tablet 3.125 mg  3.125 mg Oral QPM Domenic Polite, MD      . clopidogrel (PLAVIX) tablet 75 mg  75 mg Oral Q breakfast Jani Gravel, MD   75 mg at 08/07/19 0920  . levothyroxine (SYNTHROID) tablet 75 mcg  75 mcg Oral QAC breakfast Jani Gravel, MD   75 mcg at 08/07/19 [phone]  . polyvinyl alcohol (LIQUIFILM TEARS) 1.4 % ophthalmic solution 1 drop  1 drop Both Eyes TID PRN Jani Gravel, MD      . pramipexole (MIRAPEX) tablet 0.25 mg  0.25 mg Oral QHS Jani Gravel, MD      . sertraline (ZOLOFT) tablet 25 mg  25 mg Oral Daily Jani Gravel, MD      . sodium chloride flush (NS) 0.9 % injection 3 mL  3 mL Intravenous Q12H Jani Gravel, MD      . sodium chloride flush (NS) 0.9 % injection 3 mL  3 mL Intravenous PRN Jani Gravel, MD      . tamsulosin [facility]) capsule 0.4 mg  0.4 mg Oral Daily Jani Gravel, MD   0.4 mg at 08/07/19 [id number]    Allergies as of 08/06/2019  . (No Known Allergies)    Family History  Problem Relation Age of Onset  . Lung cancer Brother 53  . GI problems Sister     Social History   Socioeconomic History   . Marital status: Married    Spouse name: Not on file  . Number of children: Not on file  . Years of education: Not on file  . Highest education level: Not on file  Occupational History  . Not on file  Tobacco Use  . Smoking status: Former Smoker    Packs/day: 1.00    Years: 20.00    Pack years: 20.00    Types: Cigarettes    Quit date: 10/28/1948    Years since quitting: 70.8  . Smokeless tobacco: Never Used  Substance and Sexual Activity  . Alcohol use: Not Currently    Comment: 4 oz. wine daily  . Drug use: No  . Sexual activity: Not on file  Other Topics Concern  . Not on file  Social History Narrative  . Not on file   Social Determinants of Health   Financial Resource Strain:   . Difficulty of Paying Living Expenses: Not on file  Food Insecurity:   . Worried About Charity fundraiser in the Last Year: Not on file  . Ran Out of Food in the Last Year: Not on file  Transportation Needs:   . Lack of Transportation (Medical): Not on file  . Lack of Transportation (Non-Medical): Not on file  Physical Activity:   . Days of Exercise per Week: Not on file  . Minutes of Exercise per Session: Not on file  Stress:   . Feeling of Stress : Not on file  Social Connections:   . Frequency of Communication with Friends and Family: Not on file  . Frequency of Social Gatherings with Friends and Family: Not on file  . Attends Religious Services: Not on file  . Active Member of Clubs or  Organizations: Not on file  . Attends Archivist Meetings: Not on file  . Marital Status: Not on file  Intimate Partner Violence:   . Fear of Current or Ex-Partner: Not on file  . Emotionally Abused: Not on file  . Physically Abused: Not on file  . Sexually Abused: Not on file    Review of Systems: ROS is negative except as mentioned in HPI.  Physical Exam: Vital signs in last 24 hours: Temp:  [98.1 F (36.7 C)-98.2 F (36.8 C)] 98.1 F (36.7 C) (12/14 1036) Pulse Rate:  [55-98]  75 (12/14 1036) Resp:  [14-22] 16 (12/14 0945) BP: (92-142)/(47-73) 142/73 (12/14 1036) SpO2:  [86 %-100 %] 92 % (12/14 1036) Weight:  [53.5 kg] 53.5 kg (12/14 1036)   General:  Alert, frail, pleasant and cooperative in NAD Head:  Normocephalic.  Scalp hematoma on the left with dried blood. Eyes:  Sclera clear, no icterus.  Conjunctiva pink. Ears:  Extremely HOH. Mouth:  No deformity or lesions.   Lungs:  Clear throughout to auscultation.  No wheezes, crackles, or rhonchi.  Heart:  Regular rate and rhythm; no murmurs, clicks, rubs, or gallops. Abdomen:  Soft, non-distended.  BS present.  Non-tender.  Rectal:  Deferred.  Heme negative in the ED.  Msk:  Symmetrical without gross deformities. Pulses:  Normal pulses noted. Extremities:  Without clubbing or edema. Neurologic:  Alert and oriented x 4;  grossly normal neurologically. Skin:  Intact without significant lesions or rashes. Psych:  Alert and cooperative. Normal mood and affect.  Intake/Output from previous day: 12/13 0701 - 12/14 0700 In: 315 [Blood:315] Out: -  Intake/Output this shift: Total I/O In: 352.7 [I.V.:37.7; Blood:315] Out: -   Lab Results: Recent Labs    08/06/19 2149 08/07/19 0247  WBC 7.3 9.4  HGB 7.5* 6.6*  HCT 26.1* 22.9*  PLT 175 176   BMET Recent Labs    08/06/19 2149 08/07/19 0247  NA 142 144  K 4.6 4.4  CL 107 110  CO2 28 26  GLUCOSE 144* 118*  BUN 35* 33*  CREATININE 1.28* 1.15  CALCIUM 9.5 9.1   LFT Recent Labs    08/07/19 0247  PROT 5.2*  ALBUMIN 2.7*  AST 21  ALT 17  ALKPHOS 58  BILITOT 0.6   Studies/Results: CT Head Wo Contrast  Result Date: 08/06/2019 CLINICAL DATA:  85 year old post unwitnessed fall. On anticoagulation. History of subdural hematoma. EXAM: CT HEAD WITHOUT CONTRAST CT CERVICAL SPINE WITHOUT CONTRAST TECHNIQUE: Multidetector CT imaging of the head and cervical spine was performed following the standard protocol without intravenous contrast. Multiplanar  CT image reconstructions of the cervical spine were also generated. COMPARISON:  Head CT 01/22/2016 FINDINGS: CT HEAD FINDINGS Brain: No intracranial hemorrhage, mass effect, or midline shift. Age related atrophy. Moderate chronic small vessel ischemia. Right frontal encephalomalacia in the region of prior hemorrhage. No hydrocephalus. The basilar cisterns are patent. No evidence of territorial infarct or acute ischemia. No extra-axial or intracranial fluid collection. Vascular: Atherosclerosis of skullbase vasculature without hyperdense vessel or abnormal calcification. Skull: No fracture or focal lesion. Sinuses/Orbits: Paranasal sinuses and mastoid air cells are clear. The visualized orbits are unremarkable. Bilateral cataract resection. Other: Posterior parietal scalp hematoma with overlying dressing in place. CT CERVICAL SPINE FINDINGS Alignment: No traumatic subluxation. Trace anterolisthesis of C7 on T1 appears degenerative. Skull base and vertebrae: No acute fracture. Cervical vertebral body heights are maintained. Minimal anterior wedging of T2 vertebral body is chronic and unchanged from prior  exam. The dens and skull base are intact. Soft tissues and spinal canal: No prevertebral fluid or swelling. No visible canal hematoma. Disc levels: Degenerative disc disease is most prominent at C5-C6. Multilevel facet hypertrophy. Upper chest: Emphysema. No acute findings. Other: Advanced carotid calcifications. IMPRESSION: 1. Posterior parietal scalp hematoma with overlying dressing in place. No acute intracranial abnormality. No skull fracture. 2. Age related atrophy and chronic small vessel ischemia. Right frontal encephalomalacia from prior hemorrhage. 3. Degenerative change in the cervical spine without acute fracture or subluxation. 4. Carotid and skull base atherosclerosis. Emphysema noted in the lung apices. Emphysema (ICD10-J43.9). Electronically Signed   By: Keith Rake M.D.   On: 08/06/2019 22:47    CT Cervical Spine Wo Contrast  Result Date: 08/06/2019 CLINICAL DATA:  85 year old post unwitnessed fall. On anticoagulation. History of subdural hematoma. EXAM: CT HEAD WITHOUT CONTRAST CT CERVICAL SPINE WITHOUT CONTRAST TECHNIQUE: Multidetector CT imaging of the head and cervical spine was performed following the standard protocol without intravenous contrast. Multiplanar CT image reconstructions of the cervical spine were also generated. COMPARISON:  Head CT 01/22/2016 FINDINGS: CT HEAD FINDINGS Brain: No intracranial hemorrhage, mass effect, or midline shift. Age related atrophy. Moderate chronic small vessel ischemia. Right frontal encephalomalacia in the region of prior hemorrhage. No hydrocephalus. The basilar cisterns are patent. No evidence of territorial infarct or acute ischemia. No extra-axial or intracranial fluid collection. Vascular: Atherosclerosis of skullbase vasculature without hyperdense vessel or abnormal calcification. Skull: No fracture or focal lesion. Sinuses/Orbits: Paranasal sinuses and mastoid air cells are clear. The visualized orbits are unremarkable. Bilateral cataract resection. Other: Posterior parietal scalp hematoma with overlying dressing in place. CT CERVICAL SPINE FINDINGS Alignment: No traumatic subluxation. Trace anterolisthesis of C7 on T1 appears degenerative. Skull base and vertebrae: No acute fracture. Cervical vertebral body heights are maintained. Minimal anterior wedging of T2 vertebral body is chronic and unchanged from prior exam. The dens and skull base are intact. Soft tissues and spinal canal: No prevertebral fluid or swelling. No visible canal hematoma. Disc levels: Degenerative disc disease is most prominent at C5-C6. Multilevel facet hypertrophy. Upper chest: Emphysema. No acute findings. Other: Advanced carotid calcifications. IMPRESSION: 1. Posterior parietal scalp hematoma with overlying dressing in place. No acute intracranial abnormality. No skull  fracture. 2. Age related atrophy and chronic small vessel ischemia. Right frontal encephalomalacia from prior hemorrhage. 3. Degenerative change in the cervical spine without acute fracture or subluxation. 4. Carotid and skull base atherosclerosis. Emphysema noted in the lung apices. Emphysema (ICD10-J43.9). Electronically Signed   By: Keith Rake M.D.   On: 08/06/2019 22:47   IMPRESSION:  *Symptomatic IDA:  Hgb 6.6 grams as compared to 9.7 grams one month and and 10.2 grams two months ago.  Ferritin 11, serum iron 13, iron saturation 4.  He is actually FOBT negative.  Has received 2 units PRBC's and going to receive one dose of IV iron/Feraheme. *CAD s/p CABG, H/o AS s/p TAVR, AAA:  Cardiology as given the ok to discontinue Plavix and started ASA 81 mg daily.  PLAN: -Patient has declined any GI procedures for now. -Agree with IV iron infusion. -Hgb to be monitored closely as an outpatient by his PCP.  Laban Emperor. Mitzy Naron  08/07/2019, 11:49 AM

## 2019-08-07 NOTE — ED Triage Notes (Signed)
Secure text sent to Attending to request clarification of Lasix dosing.

## 2019-08-07 NOTE — Progress Notes (Signed)
  [place] VALVE TEAM   Patient admitted with fall. He was found to have severe iron deficiency anemia with a Hg down to 6.7. There is also question of chronic intermittent GI bleeding. He had TAVR in 04/2019. He is almost 3 months out from TAVR. I have reccommended that he stop plavix at this time and continue on only a baby aspirin 81 mg at this time.   Angelena Form PA-C  [facility]

## 2019-08-07 NOTE — H&P (Signed)
TRH H&P    Patient Demographics:    Raymond Swanson, is a 85 y.o. male  MRN: [id number]  DOB - [birth year]  Admit Date - 08/06/2019  Referring MD/NP/PA:  Franchot Heidelberg  Outpatient Primary MD for the patient is Isaac Bliss, Rayford Halsted, MD  Patient coming from:  home  Chief complaint-   Fall,  anemia   HPI:    Raymond Swanson  is a 85 y.o. male,  w hypothyroidism, h/o spinal compression fractures, h/o prostate cancer,  hypertension, hyperlipidemia, AAA, CAD s/p CABG, h/o Aortic stenosis, s/p TAVR, h/o Subdural hematoma, h/o PUD, anemia, presents with fall, pt states that he slipped. No LOC,  Pt has scalp lac.    In ED,  T afebrile P 55 Bp 111/62  Pox 96% on RA  CT brain / C spine IMPRESSION: 1. Posterior parietal scalp hematoma with overlying dressing in place. No acute intracranial abnormality. No skull fracture. 2. Age related atrophy and chronic small vessel ischemia. Right frontal encephalomalacia from prior hemorrhage. 3. Degenerative change in the cervical spine without acute fracture or subluxation. 4. Carotid and skull base atherosclerosis. Emphysema noted in the lung apices.  Wbc 7.3, hgb 7.5, Plt 175 Na 142, K 4.6, Bun 35, Creatinine 1.28 ASt 23, Alt 18  ED requesting admission for monitoring of anemia          Review of systems:    In addition to the HPI above,  No Fever-chills, No Headache, No changes with Vision or hearing, No problems swallowing food or Liquids, No Chest pain, Cough or Shortness of Breath, No Abdominal pain, No Nausea or Vomiting, bowel movements are regular, No Blood in stool or Urine, No dysuria, No new skin rashes or bruises, No new joints pains-aches,  No new weakness, tingling, numbness in any extremity, No recent weight gain or loss, No polyuria, polydypsia or polyphagia, No significant Mental Stressors.  All other systems reviewed and are  negative.    Past History of the following :    Past Medical History:  Diagnosis Date  . Acute gastric ulcer with hemorrhage, without mention of obstruction 09/10/2009  . ANEMIA 10/08/2009  . CORONARY ARTERY DISEASE 01/26/2007   s/p remote CABG x3V  . Dyspnea   . History of kidney stones   . HOH (hard of hearing)   . Hx of bladder cancer    s/p TURBT by Dr. Lovena Neighbours  . HYPERLIPIDEMIA 01/26/2007  . HYPERTENSION 01/26/2007  . HYPOTHYROIDISM 10/18/2007  . PROSTATE CANCER, HX OF 01/26/2007  . SKIN CANCER, HX OF 01/26/2007  . Subdural hematoma ([place]) 2016   fall from deck while cleaning gutters; sustained subdemral hematoma ; doing ok now       Past Surgical History:  Procedure Laterality Date  . CATARACT EXTRACTION    . CORONARY ARTERY BYPASS GRAFT     1991; reports at PAT appt 10-11-17: i went to my priamry doctor for a physical and had aroutine stress test that was bad, was sent for cath ( more than 1 but nsure how many  or if stents were placed), he rprots " that didnt work so they did the surgery". denies heart symptoms for over 20 years;  sees  cardiology [facility] annually for EKGs   . CYSTOSCOPY W/ URETERAL STENT PLACEMENT Right 10/13/2017   Procedure: CYSTOSCOPY WITH STENT REPLACEMENT;  Surgeon: Ceasar Mons, MD;  Location: [facility];  Service: Urology;  Laterality: Right;  . EXCISION MASS UPPER EXTREMETIES Left 08/11/2016   Procedure: EXCISION MASS LEFT SHOULDER, LEFT FOREARM, LEFT WRIST;  Surgeon: Judeth Horn, MD;  Location: [facility];  Service: General;  Laterality: Left;  . HERNIA REPAIR     ingunial  . MOHS SURGERY    . PROSTATE SURGERY     prostatectomy  . RIGHT/LEFT HEART CATH AND CORONARY/GRAFT ANGIOGRAPHY N/A 04/28/2019   Procedure: RIGHT/LEFT HEART CATH AND CORONARY/GRAFT ANGIOGRAPHY;  Surgeon: Sherren Mocha, MD;  Location: [facility];  Service: Cardiovascular;  Laterality: N/A;  . TEE WITHOUT CARDIOVERSION N/A 11/17/2017   Procedure: TRANSESOPHAGEAL  ECHOCARDIOGRAM (TEE);  Surgeon: Dixie Dials, MD;  Location: [facility];  Service: Cardiovascular;  Laterality: N/A;  . TEE WITHOUT CARDIOVERSION N/A 05/23/2019   Procedure: TRANSESOPHAGEAL ECHOCARDIOGRAM (TEE);  Surgeon: Sherren Mocha, MD;  Location: [facility];  Service: Open Heart Surgery;  Laterality: N/A;  . TRANSCATHETER AORTIC VALVE REPLACEMENT, TRANSFEMORAL N/A 05/23/2019   Procedure: TRANSCATHETER AORTIC VALVE REPLACEMENT, TRANSFEMORAL;  Surgeon: Sherren Mocha, MD;  Location: [facility];  Service: Open Heart Surgery;  Laterality: N/A;  . TRANSURETHRAL RESECTION OF BLADDER TUMOR N/A 10/13/2017   Procedure: TRANSURETHRAL RESECTION OF BLADDER TUMOR/ BIPOLAR (TURBT);  Surgeon: Ceasar Mons, MD;  Location: [facility];  Service: Urology;  Laterality: N/A;  ONLY NEEDS 90 MIN FOR BOTH PROCEDURES      Social History:      Social History   Tobacco Use  . Smoking status: Former Smoker    Packs/day: 1.00    Years: 20.00    Pack years: 20.00    Types: Cigarettes    Quit date: 10/28/1948    Years since quitting: 70.8  . Smokeless tobacco: Never Used  Substance Use Topics  . Alcohol use: Not Currently    Comment: 4 oz. wine daily       Family History :     Family History  Problem Relation Age of Onset  . Lung cancer Brother 41  . GI problems Sister        Home Medications:   Prior to Admission medications   Medication Sig Start Date End Date Taking? Authorizing Provider  carvedilol (COREG) 3.125 MG tablet Take 3.125-6.25 mg by mouth See admin instructions. Take 6.25 mg by mouth in the morning and 3.125 mg by mouth in the evening    Yes [provider]  acetaminophen (TYLENOL) 500 MG tablet Take 500-1,000 mg by mouth every 6 (six) hours as needed for moderate pain or headache.    [provider]  aspirin EC 81 MG tablet Take 81 mg by mouth at bedtime.     [provider]  CALCIUM PO Take 1 capsule by mouth daily.     [provider]  carboxymethylcellulose (REFRESH TEARS) 0.5 % SOLN Place 1 drop into both eyes 3 (three) times daily as needed (dry eyes).    [provider]  clopidogrel (PLAVIX) 75 MG tablet Take 1 tablet (75 mg total) by mouth daily with breakfast. 05/25/19   Eileen Stanford, PA-C  Cyanocobalamin (B-12 PO) Take 1 capsule by mouth daily.  [provider]  furosemide (LASIX) 20 MG tablet Take 20 mg by mouth daily.    [provider]  levothyroxine (SYNTHROID) 75 MCG tablet Take 75 mcg by mouth daily before breakfast.    [provider]  MELATONIN PO Take 1 tablet by mouth at bedtime.     [provider]  Multiple Vitamin (MULTIVITAMIN WITH MINERALS) TABS tablet Take 1 tablet by mouth daily.    [provider]  Multiple Vitamins-Minerals (PRESERVISION AREDS 2 PO) Take 1 tablet by mouth 2 (two) times daily.     [provider]  nitroGLYCERIN (NITROSTAT) 0.4 MG SL tablet Place 0.4 mg under the tongue every 5 (five) minutes as needed for chest pain.    [provider]  pramipexole (MIRAPEX) 0.25 MG tablet Take 0.25 mg by mouth at bedtime. 08/01/19   [provider]  sertraline (ZOLOFT) 50 MG tablet Take 0.5 tablets (25 mg total) by mouth daily. 07/27/19 09/25/19  Isaac Bliss, Rayford Halsted, MD  tamsulosin (FLOMAX) 0.4 MG CAPS capsule Take 0.4 mg by mouth daily.    [provider]  VITAMIN D PO Take 1 capsule by mouth daily.    [provider]     Allergies:    No Known Allergies   Physical Exam:   Vitals  Blood pressure 109/66, pulse 61, resp. rate 14, SpO2 (!) 86 %.  1.  General: axoxo3  2. Psychiatric: euthymic  3. Neurologic: cn2-12 intact, reflexes 2+ symmetric, diffuse, w no clonus, motor 5/5 in all 4 ext  4. HEENMT:  Anicteric, pupils 1.51mm symmetric, direct, consensual, near intact Scalp laceration s/p staples Neck: no jvd  5. Respiratory : CTAB  6.  Cardiovascular : Midline scar, rrr s1, s2,   7. Gastrointestinal:  Abd: soft, nt, nd, +bs  8. Skin:  Ext: no c/c/e,  No rash  9.Musculoskeletal:  Good ROM    Data Review:    CBC Recent Labs  Lab 08/06/19 2149  WBC 7.3  HGB 7.5*  HCT 26.1*  PLT 175  MCV 82.1  MCH 23.6*  MCHC 28.7*  RDW 17.5*  LYMPHSABS 0.6*  MONOABS 0.8  EOSABS 0.3  BASOSABS 0.0   ------------------------------------------------------------------------------------------------------------------  Results for orders placed or performed during the hospital encounter of 08/06/19 (from the past 48 hour(s))  CBC with Differential     Status: Abnormal   Collection Time: 08/06/19  9:49 PM  Result Value Ref Range   WBC 7.3 4.0 - 10.5 K/uL   RBC 3.18 (L) 4.22 - 5.81 MIL/uL   Hemoglobin 7.5 (L) 13.0 - 17.0 g/dL   HCT 26.1 (L) 39.0 - 52.0 %   MCV 82.1 80.0 - 100.0 fL   MCH 23.6 (L) 26.0 - 34.0 pg   MCHC 28.7 (L) 30.0 - 36.0 g/dL   RDW 17.5 (H) 11.5 - 15.5 %   Platelets 175 150 - 400 K/uL   nRBC 0.0 0.0 - 0.2 %   Neutrophils Relative % 76 %   Neutro Abs 5.5 1.7 - 7.7 K/uL   Lymphocytes Relative 9 %   Lymphs Abs 0.6 (L) 0.7 - 4.0 K/uL   Monocytes Relative 11 %   Monocytes Absolute 0.8 0.1 - 1.0 K/uL   Eosinophils Relative 3 %   Eosinophils Absolute 0.3 0.0 - 0.5 K/uL   Basophils Relative 0 %   Basophils Absolute 0.0 0.0 - 0.1 K/uL   Immature Granulocytes 1 %   Abs Immature Granulocytes 0.04 0.00 - 0.07 K/uL  Comment: Performed at [facility], [place] [address] [place], [place] [address]  Comprehensive metabolic panel     Status: Abnormal   Collection Time: 08/06/19  9:49 PM  Result Value Ref Range   Sodium 142 135 - 145 mmol/L   Potassium 4.6 3.5 - 5.1 mmol/L   Chloride 107 98 - 111 mmol/L   CO2 28 22 - 32 mmol/L   Glucose, Bld 144 (H) 70 - 99 mg/dL   BUN 35 (H) 8 - 23 mg/dL   Creatinine, Ser 1.28 (H) 0.61 - 1.24 mg/dL   Calcium 9.5 8.9 - 10.3 mg/dL   Total Protein 5.8 (L) 6.5 - 8.1 g/dL    Albumin 3.0 (L) 3.5 - 5.0 g/dL   AST 23 15 - 41 U/L   ALT 18 0 - 44 U/L   Alkaline Phosphatase 70 38 - 126 U/L   Total Bilirubin 0.5 0.3 - 1.2 mg/dL   GFR calc non Af Amer 48 (L) >60 mL/min   GFR calc Af Amer 56 (L) >60 mL/min   Anion gap 7 5 - 15    Comment: Performed at [facility] [address], [place], [place] [address]  POC occult blood, ED Provider will collect     Status: None   Collection Time: 08/06/19 11:49 PM  Result Value Ref Range   Fecal Occult Bld NEGATIVE NEGATIVE    Chemistries  Recent Labs  Lab 08/06/19 2149  NA 142  K 4.6  CL 107  CO2 28  GLUCOSE 144*  BUN 35*  CREATININE 1.28*  CALCIUM 9.5  AST 23  ALT 18  ALKPHOS 70  BILITOT 0.5   ------------------------------------------------------------------------------------------------------------------  ------------------------------------------------------------------------------------------------------------------ GFR: Estimated Creatinine Clearance: 28.3 mL/min (A) (by C-G formula based on SCr of 1.28 mg/dL (H)). Liver Function Tests: Recent Labs  Lab 08/06/19 2149  AST 23  ALT 18  ALKPHOS 70  BILITOT 0.5  PROT 5.8*  ALBUMIN 3.0*   No results for input(s): LIPASE, AMYLASE in the last 168 hours. No results for input(s): AMMONIA in the last 168 hours. Coagulation Profile: No results for input(s): INR, PROTIME in the last 168 hours. Cardiac Enzymes: No results for input(s): CKTOTAL, CKMB, CKMBINDEX, TROPONINI in the last 168 hours. BNP (last 3 results) No results for input(s): PROBNP in the last 8760 hours. HbA1C: No results for input(s): HGBA1C in the last 72 hours. CBG: No results for input(s): GLUCAP in the last 168 hours. Lipid Profile: No results for input(s): CHOL, HDL, LDLCALC, TRIG, CHOLHDL, LDLDIRECT in the last 72 hours. Thyroid Function Tests: No results for input(s): TSH, T4TOTAL, FREET4, T3FREE, THYROIDAB in the last 72 hours. Anemia Panel: No results for input(s):  VITAMINB12, FOLATE, FERRITIN, TIBC, IRON, RETICCTPCT in the last 72 hours.  --------------------------------------------------------------------------------------------------------------- Urine analysis:    Component Value Date/Time   COLORURINE YELLOW 11/14/2017 0057   APPEARANCEUR Clear 05/22/2019 0810   LABSPEC 1.018 11/14/2017 0057   PHURINE 5.0 11/14/2017 0057   GLUCOSEU Negative 05/22/2019 0810   HGBUR MODERATE (A) 11/14/2017 0057   HGBUR 1+ 10/22/2009 0859   BILIRUBINUR Negative 05/22/2019 0810   KETONESUR NEGATIVE 11/14/2017 0057   PROTEINUR Trace 05/22/2019 0810   PROTEINUR 100 (A) 11/14/2017 0057   UROBILINOGEN 0.2 12/24/2015 1119   UROBILINOGEN 0.2 10/22/2009 0859   NITRITE Negative 05/22/2019 0810   NITRITE NEGATIVE 11/14/2017 0057   LEUKOCYTESUR Trace (A) 05/22/2019 0810      Imaging Results:    CT Head Wo Contrast  Result Date:  08/06/2019 CLINICAL DATA:  85 year old post unwitnessed fall. On anticoagulation. History of subdural hematoma. EXAM: CT HEAD WITHOUT CONTRAST CT CERVICAL SPINE WITHOUT CONTRAST TECHNIQUE: Multidetector CT imaging of the head and cervical spine was performed following the standard protocol without intravenous contrast. Multiplanar CT image reconstructions of the cervical spine were also generated. COMPARISON:  Head CT 01/22/2016 FINDINGS: CT HEAD FINDINGS Brain: No intracranial hemorrhage, mass effect, or midline shift. Age related atrophy. Moderate chronic small vessel ischemia. Right frontal encephalomalacia in the region of prior hemorrhage. No hydrocephalus. The basilar cisterns are patent. No evidence of territorial infarct or acute ischemia. No extra-axial or intracranial fluid collection. Vascular: Atherosclerosis of skullbase vasculature without hyperdense vessel or abnormal calcification. Skull: No fracture or focal lesion. Sinuses/Orbits: Paranasal sinuses and mastoid air cells are clear. The visualized orbits are unremarkable. Bilateral  cataract resection. Other: Posterior parietal scalp hematoma with overlying dressing in place. CT CERVICAL SPINE FINDINGS Alignment: No traumatic subluxation. Trace anterolisthesis of C7 on T1 appears degenerative. Skull base and vertebrae: No acute fracture. Cervical vertebral body heights are maintained. Minimal anterior wedging of T2 vertebral body is chronic and unchanged from prior exam. The dens and skull base are intact. Soft tissues and spinal canal: No prevertebral fluid or swelling. No visible canal hematoma. Disc levels: Degenerative disc disease is most prominent at C5-C6. Multilevel facet hypertrophy. Upper chest: Emphysema. No acute findings. Other: Advanced carotid calcifications. IMPRESSION: 1. Posterior parietal scalp hematoma with overlying dressing in place. No acute intracranial abnormality. No skull fracture. 2. Age related atrophy and chronic small vessel ischemia. Right frontal encephalomalacia from prior hemorrhage. 3. Degenerative change in the cervical spine without acute fracture or subluxation. 4. Carotid and skull base atherosclerosis. Emphysema noted in the lung apices. Emphysema (ICD10-J43.9). Electronically Signed   By: Keith Rake M.D.   On: 08/06/2019 22:47   CT Cervical Spine Wo Contrast  Result Date: 08/06/2019 CLINICAL DATA:  85 year old post unwitnessed fall. On anticoagulation. History of subdural hematoma. EXAM: CT HEAD WITHOUT CONTRAST CT CERVICAL SPINE WITHOUT CONTRAST TECHNIQUE: Multidetector CT imaging of the head and cervical spine was performed following the standard protocol without intravenous contrast. Multiplanar CT image reconstructions of the cervical spine were also generated. COMPARISON:  Head CT 01/22/2016 FINDINGS: CT HEAD FINDINGS Brain: No intracranial hemorrhage, mass effect, or midline shift. Age related atrophy. Moderate chronic small vessel ischemia. Right frontal encephalomalacia in the region of prior hemorrhage. No hydrocephalus. The basilar  cisterns are patent. No evidence of territorial infarct or acute ischemia. No extra-axial or intracranial fluid collection. Vascular: Atherosclerosis of skullbase vasculature without hyperdense vessel or abnormal calcification. Skull: No fracture or focal lesion. Sinuses/Orbits: Paranasal sinuses and mastoid air cells are clear. The visualized orbits are unremarkable. Bilateral cataract resection. Other: Posterior parietal scalp hematoma with overlying dressing in place. CT CERVICAL SPINE FINDINGS Alignment: No traumatic subluxation. Trace anterolisthesis of C7 on T1 appears degenerative. Skull base and vertebrae: No acute fracture. Cervical vertebral body heights are maintained. Minimal anterior wedging of T2 vertebral body is chronic and unchanged from prior exam. The dens and skull base are intact. Soft tissues and spinal canal: No prevertebral fluid or swelling. No visible canal hematoma. Disc levels: Degenerative disc disease is most prominent at C5-C6. Multilevel facet hypertrophy. Upper chest: Emphysema. No acute findings. Other: Advanced carotid calcifications. IMPRESSION: 1. Posterior parietal scalp hematoma with overlying dressing in place. No acute intracranial abnormality. No skull fracture. 2. Age related atrophy and chronic small vessel ischemia. Right frontal encephalomalacia from prior hemorrhage.  3. Degenerative change in the cervical spine without acute fracture or subluxation. 4. Carotid and skull base atherosclerosis. Emphysema noted in the lung apices. Emphysema (ICD10-J43.9). Electronically Signed   By: Keith Rake M.D.   On: 08/06/2019 22:47   nsr at 21, nl axis,    Assessment & Plan:    Principal Problem:   Symptomatic anemia Active Problems:   Hypothyroidism   Essential hypertension  Symptomatic anemia Check ferritin, iron, tibc, b12, folate Check cbc in am, if hgb <7 transfuse  Fall PT to evaluate and tx for gait instability  CAD s/p CABG H/o AS s/p TAVR AAA Cont  aspirin 81mg  po qday Cont Plavix 75mg  po qday Cont Carvedilol  Cont Lasix 20mg  po qday  Hyothyroidism Cont Levothyroxine 75 micrograms po qday  BPH/ h/o prostate cancer  Cont Flomax 0.4mg  po qday  Anxiety Cont Zoloft 25mg  po qday  RLS Cont Mirazpex  H/o R renal lesion on prior CT 05/11/19 Please make sure has follow up with urology    DVT Prophylaxis-  - SCDs   AM Labs Ordered, also please review Full Orders  Family Communication: Admission, patients condition and plan of care including tests being ordered have been discussed with the patient  who indicate understanding and agree with the plan and Code Status.  Code Status:  FULL CODE per patient, notified wife that patient admitted for anemia   Admission status: Observation/: Based on patients clinical presentation and evaluation of above clinical data, I have made determination that patient meets observation criteria at this time.    Time spent in minutes : 55 minutes   Jani Gravel M.D on 08/07/2019 at 1:03 AM

## 2019-08-07 NOTE — ED Triage Notes (Signed)
TC to Patients Wife to give update and new room number.

## 2019-08-08 DIAGNOSIS — D649 Anemia, unspecified: Secondary | ICD-10-CM | POA: Diagnosis not present

## 2019-08-08 LAB — CBC
HCT: 26.4 % — ABNORMAL LOW (ref 39.0–52.0)
Hemoglobin: 8.2 g/dL — ABNORMAL LOW (ref 13.0–17.0)
MCH: 25.8 pg — ABNORMAL LOW (ref 26.0–34.0)
MCHC: 31.1 g/dL (ref 30.0–36.0)
MCV: 83 fL (ref 80.0–100.0)
Platelets: 153 10*3/uL (ref 150–400)
RBC: 3.18 MIL/uL — ABNORMAL LOW (ref 4.22–5.81)
RDW: 17 % — ABNORMAL HIGH (ref 11.5–15.5)
WBC: 6.8 10*3/uL (ref 4.0–10.5)
nRBC: 0 % (ref 0.0–0.2)

## 2019-08-08 LAB — FOLATE RBC
Folate, Hemolysate: 525 ng/mL
Folate, RBC: 2263 ng/mL (ref >498)
Hematocrit: 23.2 % — ABNORMAL LOW (ref 37.5–51.0)

## 2019-08-08 LAB — BASIC METABOLIC PANEL
Anion gap: 7 (ref 5–15)
BUN: 28 mg/dL — ABNORMAL HIGH (ref 8–23)
CO2: 27 mmol/L (ref 22–32)
Calcium: 8.8 mg/dL — ABNORMAL LOW (ref 8.9–10.3)
Chloride: 109 mmol/L (ref 98–111)
Creatinine, Ser: 1.2 mg/dL (ref 0.61–1.24)
GFR calc Af Amer: >60 mL/min (ref >60)
GFR calc non Af Amer: 52 mL/min — ABNORMAL LOW (ref >60)
Glucose, Bld: 96 mg/dL (ref 70–99)
Potassium: 4 mmol/L (ref 3.5–5.1)
Sodium: 143 mmol/L (ref 135–145)

## 2019-08-08 LAB — TYPE AND SCREEN
ABO/RH(D): O POS
Antibody Screen: NEGATIVE
Unit division: 0
Unit division: 0

## 2019-08-08 LAB — BPAM RBC
Blood Product Expiration Date: 202012212359
Blood Product Expiration Date: 202101112359
ISSUE DATE / TIME: 202012140558
ISSUE DATE / TIME: 202012141315
Unit Type and Rh: 5100
Unit Type and Rh: 5100

## 2019-08-08 MED ORDER — PANTOPRAZOLE SODIUM 40 MG PO TBEC: 40.0000 mg | DELAYED_RELEASE_TABLET | Freq: Every day | ORAL | Status: DC

## 2019-08-08 MED ORDER — FERROUS SULFATE 325 (65 FE) MG PO TABS: 325.0000 mg | ORAL_TABLET | Freq: Every day | ORAL | 3 refills | Status: AC

## 2019-08-08 MED ORDER — PANTOPRAZOLE SODIUM 40 MG PO TBEC: 40.0000 mg | DELAYED_RELEASE_TABLET | Freq: Every day | ORAL | 1 refills | Status: DC

## 2019-08-08 MED ORDER — FERROUS SULFATE 325 (65 FE) MG PO TABS: 325.0000 mg | ORAL_TABLET | Freq: Every day | ORAL | 3 refills | Status: DC

## 2019-08-08 MED ORDER — FERROUS SULFATE 325 (65 FE) MG PO TABS: 325.0000 mg | ORAL_TABLET | Freq: Every day | ORAL | Status: DC

## 2019-08-08 MED FILL — PANTOPRAZOLE SOD DR 40 MG T: 40 | 30 days supply | Qty: 30 | Fill #0

## 2019-08-08 MED FILL — FERROUS SULFATE 325 MG TAB: 325 (65 FE) | 30 days supply | Qty: 30 | Fill #0

## 2019-08-08 NOTE — TOC Transition Note (Addendum)
Transition of Care [facility]) - CM/SW Discharge Note   Patient Details  Name: Raymond Swanson MRN: [id number] Date of Birth: 1925/02/28  [facility] Care [facility]) CM/SW Contact:  Zenon Mayo, RN Phone Number: 08/08/2019, 2:03 PM   Clinical Narrative:    Patient for dc .  NCM offered choice, he states he has no preference, NCM made referral to [facility] with [place].  Awaiting call back.  NCM contacted wife and informed her that patient will be dc today with [facility] for [facility] services and that we will provide transportation for him to get home, address confirmed.  NCM informed Staff RN to let know when she is ready for transport to be called. NCM also spoke with Tish , patient's caregiver, she is in [place] she wanted to make sure he has transport home, informed her he does. NCM assisted patient with transport thru  transportatioin services.    Final next level of care: [place] Barriers to Discharge: No Barriers Identified   Patient Goals and CMS Choice Patient states their goals for this hospitalization and ongoing recovery are:: get better CMS Medicare.gov Compare Post Acute Care list provided to:: Patient Choice offered to / list presented to : Patient  Discharge Placement                       Discharge Plan and Services                DME Arranged: (NA)         HH Arranged: RN, OT, Social Work, Nurse's Aide [place] Agency: [facility] [employer] Date [place]: 08/08/19 Time [place]: 1403 Representative spoke with at [place]: [place] ([place]) Interventions     Readmission Risk Interventions Readmission Risk Prevention Plan 05/25/2019  Transportation Screening Complete  PCP or Specialist Appt within 5-7 Days Complete  Home Care Screening Complete  Medication Review (RN CM) Complete  Some recent data might be hidden

## 2019-08-08 NOTE — Progress Notes (Signed)
Raymond Swanson Eads to be D/C'd Home per MD order.  Discussed with the patient and all questions fully answered.  VSS, Skin clean, dry and intact without evidence of skin break down, no evidence of skin tears noted.  IV catheter discontinued intact. Site without signs and symptoms of complications. Dressing and pressure applied.  An After Visit Summary was printed and given to the patient. Patient prescriptions delivered to bedside by [facility].   D/c education completed with patient/family including follow up instructions, medication list, d/c activities limitations if indicated, with other d/c instructions as indicated by MD - patient able to verbalize understanding, all questions fully answered.   Patient instructed to return to ED, call 911, or call MD for any changes in condition.   Patient escorted via WC to main entrance by NT, and D/C home via transportation service.   Howard Pouch 08/08/2019 8:27 PM

## 2019-08-08 NOTE — Discharge Summary (Signed)
Physician Discharge Summary  Raymond Swanson [id number] DOB: [birth year] DOA: 08/06/2019  PCP: Isaac Bliss, Rayford Halsted, MD  Admit date: 08/06/2019 Discharge date: 08/08/2019  Time spent: 40 minutes  Recommendations for Outpatient Follow-up:  1. Follow up with PCP 1-2 weeks for evaluation of symptoms, removal of staples. Recommend cbc track Hg. Recommend iron studies 2 months per GI 2. Home health PT/OT/RN/Aid/SW ordered. Patient lives at home with demented wife.    Discharge Diagnoses:  Principal Problem:   Symptomatic anemia Active Problems:   History of transcatheter aortic valve replacement (TAVR)   Iron deficiency anemia   Hypothyroidism   Essential hypertension   Discharge Condition: stable  Diet recommendation: heart healthy  Filed Weights   08/07/19 1036 08/08/19 0500  Weight: 53.5 kg 53.8 kg    History of present illness:   Raymond Swanson  is a very pleasant and very HOH 85 y.o. man  w hypothyroidism, h/o spinal compression fractures, h/o prostate cancer,  hypertension, hyperlipidemia, AAA, CAD s/p CABG, h/o Aortic stenosis, s/p TAVR, h/o Subdural hematoma, h/o PUD, anemia, presented 12/14 with fall, pt stated that he slipped. No LOC,  Pt has scalp lac. Work up revealed severe IDA admitted for transfusion and further workup  Hospital Course:  1.  Severe iron deficiency anemia/fall. Baseline hemoglobin around 9.8-10 in September 2020 when he had his TAVR, was started on Plavix then, hb dropped to 6.7 ferritin 11, iron 13.  Hemoccult negative. s/p 2 units of PRBC and IV iron x1. Evaluated by GI who opined IDA but could have obscure, occult GI blood loss as etiology especially in setting of plavix. Patient declined endoscopic evaluation and agreed to stopping plavix and starting PPI. Of note, chart review indicates EGD 08/2009 by Dr. Carlean Purl noted gastric ulcer and erosions. Recommend follow up 1-2 weeks with PCP for evaluation of Hg. Iron supplement provided at discharge  as well  2.  History of severe AS status post recent TAVR in 9/20. Home meds included aspirin and Plavix. Plavix stopped with cards blessing.  3.  Fall, scalp hematoma Laceration repair in the ED, treated with 7 staples. Follow up with PCP   Procedures: Staple to head lac Consultations:  [facility]  Discharge Exam: Vitals:   08/08/19 1048 08/08/19 1134  BP: 122/60 111/73  Pulse: 68 68  Resp:  18  Temp:  97.8 F (36.6 C)  SpO2: 90% 90%    General: awake alert HOH no acute distress. Just finished breakfast Cardiovascular: RRR no mgr no LE edema Respiratory: normal effort BS clear bilaterally no wheeze  Discharge Instructions   Discharge Instructions    Call MD for:  difficulty breathing, headache or visual disturbances   Complete by: As directed    Call MD for:  persistant dizziness or light-headedness   Complete by: As directed    Call MD for:  temperature >100.4   Complete by: As directed    Diet - low sodium heart healthy   Complete by: As directed    Discharge instructions   Complete by: As directed    Take medications as prescribed Follow up with PCP 1-2 weeks for evaluation of symptoms. Recommend cbc to track Hg level   Increase activity slowly   Complete by: As directed      Allergies as of 08/08/2019   No Known Allergies     Medication List    STOP taking these medications   clopidogrel 75 MG tablet Commonly known as: PLAVIX     TAKE  these medications   acetaminophen 500 MG tablet Commonly known as: TYLENOL Take 500-1,000 mg by mouth every 6 (six) hours as needed for moderate pain or headache.   aspirin EC 81 MG tablet Take 81 mg by mouth at bedtime.   B-12 PO Take 1 capsule by mouth daily.   CALCIUM PO Take 1 capsule by mouth daily.   carvedilol 3.125 MG tablet Commonly known as: COREG Take 3.125-6.25 mg by mouth See admin instructions. Take 6.25 mg by mouth in the morning and 3.125 mg by mouth in the evening   ferrous sulfate 325 (65  FE) MG tablet Take 1 tablet (325 mg total) by mouth daily with breakfast. Start taking on: August 09, 2019   furosemide 20 MG tablet Commonly known as: LASIX Take 20 mg by mouth daily.   levothyroxine 75 MCG tablet Commonly known as: SYNTHROID Take 75 mcg by mouth daily before breakfast.   MELATONIN PO Take 1 tablet by mouth at bedtime.   multivitamin with minerals Tabs tablet Take 1 tablet by mouth daily.   nitroGLYCERIN 0.4 MG SL tablet Commonly known as: NITROSTAT Place 0.4 mg under the tongue every 5 (five) minutes as needed for chest pain.   pantoprazole 40 MG tablet Commonly known as: PROTONIX Take 1 tablet (40 mg total) by mouth daily.   pramipexole 0.25 MG tablet Commonly known as: MIRAPEX Take 0.25 mg by mouth at bedtime.   PRESERVISION AREDS 2 PO Take 1 tablet by mouth 2 (two) times daily.   Refresh Tears 0.5 % Soln Generic drug: carboxymethylcellulose Place 1 drop into both eyes 3 (three) times daily as needed (dry eyes).   sertraline 50 MG tablet Commonly known as: Zoloft Take 0.5 tablets (25 mg total) by mouth daily.   tamsulosin 0.4 MG Caps capsule Commonly known as: FLOMAX Take 0.4 mg by mouth daily.   VITAMIN D PO Take 1 capsule by mouth daily.      No Known Allergies    The results of significant diagnostics from this hospitalization (including imaging, microbiology, ancillary and laboratory) are listed below for reference.    Significant Diagnostic Studies: CT Head Wo Contrast  Result Date: 08/06/2019 CLINICAL DATA:  85 year old post unwitnessed fall. On anticoagulation. History of subdural hematoma. EXAM: CT HEAD WITHOUT CONTRAST CT CERVICAL SPINE WITHOUT CONTRAST TECHNIQUE: Multidetector CT imaging of the head and cervical spine was performed following the standard protocol without intravenous contrast. Multiplanar CT image reconstructions of the cervical spine were also generated. COMPARISON:  Head CT 01/22/2016 FINDINGS: CT HEAD  FINDINGS Brain: No intracranial hemorrhage, mass effect, or midline shift. Age related atrophy. Moderate chronic small vessel ischemia. Right frontal encephalomalacia in the region of prior hemorrhage. No hydrocephalus. The basilar cisterns are patent. No evidence of territorial infarct or acute ischemia. No extra-axial or intracranial fluid collection. Vascular: Atherosclerosis of skullbase vasculature without hyperdense vessel or abnormal calcification. Skull: No fracture or focal lesion. Sinuses/Orbits: Paranasal sinuses and mastoid air cells are clear. The visualized orbits are unremarkable. Bilateral cataract resection. Other: Posterior parietal scalp hematoma with overlying dressing in place. CT CERVICAL SPINE FINDINGS Alignment: No traumatic subluxation. Trace anterolisthesis of C7 on T1 appears degenerative. Skull base and vertebrae: No acute fracture. Cervical vertebral body heights are maintained. Minimal anterior wedging of T2 vertebral body is chronic and unchanged from prior exam. The dens and skull base are intact. Soft tissues and spinal canal: No prevertebral fluid or swelling. No visible canal hematoma. Disc levels: Degenerative disc disease is most prominent at  C5-C6. Multilevel facet hypertrophy. Upper chest: Emphysema. No acute findings. Other: Advanced carotid calcifications. IMPRESSION: 1. Posterior parietal scalp hematoma with overlying dressing in place. No acute intracranial abnormality. No skull fracture. 2. Age related atrophy and chronic small vessel ischemia. Right frontal encephalomalacia from prior hemorrhage. 3. Degenerative change in the cervical spine without acute fracture or subluxation. 4. Carotid and skull base atherosclerosis. Emphysema noted in the lung apices. Emphysema (ICD10-J43.9). Electronically Signed   By: Keith Rake M.D.   On: 08/06/2019 22:47   CT Cervical Spine Wo Contrast  Result Date: 08/06/2019 CLINICAL DATA:  85 year old post unwitnessed fall. On  anticoagulation. History of subdural hematoma. EXAM: CT HEAD WITHOUT CONTRAST CT CERVICAL SPINE WITHOUT CONTRAST TECHNIQUE: Multidetector CT imaging of the head and cervical spine was performed following the standard protocol without intravenous contrast. Multiplanar CT image reconstructions of the cervical spine were also generated. COMPARISON:  Head CT 01/22/2016 FINDINGS: CT HEAD FINDINGS Brain: No intracranial hemorrhage, mass effect, or midline shift. Age related atrophy. Moderate chronic small vessel ischemia. Right frontal encephalomalacia in the region of prior hemorrhage. No hydrocephalus. The basilar cisterns are patent. No evidence of territorial infarct or acute ischemia. No extra-axial or intracranial fluid collection. Vascular: Atherosclerosis of skullbase vasculature without hyperdense vessel or abnormal calcification. Skull: No fracture or focal lesion. Sinuses/Orbits: Paranasal sinuses and mastoid air cells are clear. The visualized orbits are unremarkable. Bilateral cataract resection. Other: Posterior parietal scalp hematoma with overlying dressing in place. CT CERVICAL SPINE FINDINGS Alignment: No traumatic subluxation. Trace anterolisthesis of C7 on T1 appears degenerative. Skull base and vertebrae: No acute fracture. Cervical vertebral body heights are maintained. Minimal anterior wedging of T2 vertebral body is chronic and unchanged from prior exam. The dens and skull base are intact. Soft tissues and spinal canal: No prevertebral fluid or swelling. No visible canal hematoma. Disc levels: Degenerative disc disease is most prominent at C5-C6. Multilevel facet hypertrophy. Upper chest: Emphysema. No acute findings. Other: Advanced carotid calcifications. IMPRESSION: 1. Posterior parietal scalp hematoma with overlying dressing in place. No acute intracranial abnormality. No skull fracture. 2. Age related atrophy and chronic small vessel ischemia. Right frontal encephalomalacia from prior  hemorrhage. 3. Degenerative change in the cervical spine without acute fracture or subluxation. 4. Carotid and skull base atherosclerosis. Emphysema noted in the lung apices. Emphysema (ICD10-J43.9). Electronically Signed   By: Keith Rake M.D.   On: 08/06/2019 22:47    Microbiology: Recent Results (from the past 240 hour(s))  SARS CORONAVIRUS 2 (TAT 6-24 HRS) Nasopharyngeal Nasopharyngeal Swab     Status: None   Collection Time: 08/07/19  3:30 AM   Specimen: Nasopharyngeal Swab  Result Value Ref Range Status   SARS Coronavirus 2 NEGATIVE NEGATIVE Final    Comment: (NOTE) SARS-CoV-2 target nucleic acids are NOT DETECTED. The SARS-CoV-2 RNA is generally detectable in upper and lower respiratory specimens during the acute phase of infection. Negative results do not preclude SARS-CoV-2 infection, do not rule out co-infections with other pathogens, and should not be used as the sole basis for treatment or other patient management decisions. Negative results must be combined with clinical observations, patient history, and epidemiological information. The expected result is Negative. Fact Sheet for Patients: SugarRoll.be Fact Sheet for Healthcare Providers: https://www.woods-mathews.com/ This test is not yet approved or cleared by the [place] FDA and  has been authorized for detection and/or diagnosis of SARS-CoV-2 by FDA under an Emergency Use Authorization (EUA). This EUA will remain  in effect (meaning this test can  be used) for the duration of the COVID-19 declaration under Section 56 4(b)(1) of the Act, 21 U.S.C. section 360bbb-3(b)(1), unless the authorization is terminated or revoked sooner. Performed at [facility], [place] [address] [place], [place] [address]      Labs: Basic Metabolic Panel: Recent Labs  Lab 08/06/19 2149 08/07/19 0247 08/08/19 0544  NA 142 144 143  K 4.6 4.4 4.0  CL 107 110 109  CO2 28 26 27    GLUCOSE 144* 118* 96  BUN 35* 33* 28*  CREATININE 1.28* 1.15 1.20  CALCIUM 9.5 9.1 8.8*   Liver Function Tests: Recent Labs  Lab 08/06/19 2149 08/07/19 0247  AST 23 21  ALT 18 17  ALKPHOS 70 58  BILITOT 0.5 0.6  PROT 5.8* 5.2*  ALBUMIN 3.0* 2.7*   No results for input(s): LIPASE, AMYLASE in the last 168 hours. No results for input(s): AMMONIA in the last 168 hours. CBC: Recent Labs  Lab 08/06/19 2149 08/07/19 0247 08/07/19 1808 08/08/19 0544  WBC 7.3 9.4  --  6.8  NEUTROABS 5.5  --   --   --   HGB 7.5* 6.6* 8.8* 8.2*  HCT 26.1* 22.9* 28.7* 26.4*  MCV 82.1 82.4  --  83.0  PLT 175 176  --  153   Cardiac Enzymes: No results for input(s): CKTOTAL, CKMB, CKMBINDEX, TROPONINI in the last 168 hours. BNP: BNP (last 3 results) Recent Labs    05/19/19 0913  BNP 583.6*    ProBNP (last 3 results) No results for input(s): PROBNP in the last 8760 hours.  CBG: No results for input(s): GLUCAP in the last 168 hours.     SignedRadene Gunning NP [facility] 08/08/2019, 1:55 PM

## 2019-08-08 NOTE — Progress Notes (Signed)
Pt complained of no pain throughout the night. There was minimal new blood on his pillow overnight d/t moving around in bed. Staples intact still, old blood heavily matted in hair. Attempted to cleanse, but pt showed discomfort. Pt is alert and orientated. Monitoring.

## 2019-08-08 NOTE — TOC Progression Note (Signed)
Transition of Care [facility]) - Progression Note    Patient Details  Name: Raymond Swanson MRN: [id number] Date of Birth: 23-Feb-1925  Transition of Care [facility]) CM/SW [place], [place] Phone Number: [phone] 08/08/2019, 2:23 PM  Clinical Narrative:     Per patient request, CSW reached out to patient's spouse Sunday Spillers to explain dc plan, informed of patient's dc today and that he will be set up with home health. All questions and concerns answered at this time.     Barriers to Discharge: No Barriers Identified  Expected Discharge Plan and Services           Expected Discharge Date: 08/08/19               DME Arranged: (NA)         HH Arranged: RN, OT, Social Work, Nurse's Aide [place] Agency: [facility] [employer] [facility] [employer] Date [place]: 08/08/19 Time [place]: [phone] Representative spoke with at [place]: [place] ([place]) Interventions    Readmission Risk Interventions Readmission Risk Prevention Plan 05/25/2019  Transportation Screening Complete  PCP or Specialist Appt within 5-7 Days Complete  Home Care Screening Complete  Medication Review (RN CM) Complete  Some recent data might be hidden

## 2019-08-08 NOTE — Progress Notes (Signed)
Due to patient's significantly impaired hearing RN uses white board to communicate with patient.   Received call from tele at 1022, informed patient had prior wide QRS of 6 beats and tele order has expired. RN to bedside at 1028; patient asleep and resting comfortably. Patient aroused by RN and inquired if patient feeling ok; patient stated he was feeling fine.   Patient O2 89 while sleeping and 91-92% when awake.   Upon RN inquiry, patient stated would like someone to follow up with wife and let her know what is required if he goes home. Patient stated also did not want to make any decisions without wife's input. Informed social work and case management of patient's request.   Paged MD Broadus John at 17. Notified by tele @ 1022 of 6 beat wide QRS at 0720.Tele order has expired; please renew. O2 89% while sleeping.  Per patient at 1225 ok to talk to [facility] (caregiver) and tell her how he is doing.    At  1535 while reviewing AVS with patient, patientt inquired about getting prescriptions and home health RN. Informed home health is set up and wife has been notified by case manager (CM) he is coming home. RN called case manager to inform of need for patient assistance with prescription, CM to follow up MD with prescriptions to [place]. Per CM, informed patient home health to contact him within 24-48 hours to set up visits;TOC to fill prescriptions prior to discharge. Waiver signed for transportation by patient and placed in chart. Informed patient Tish made aware patient being discharged and having transportation home. TOC delivered iron and protonix and informed patient one month supply.   Patient left unit at 1700 with NT wheeled to exit by NT.

## 2019-08-23 ENCOUNTER — Telehealth: Payer: Self-pay | Admitting: Internal Medicine

## 2019-08-23 DIAGNOSIS — Z7982 Long term (current) use of aspirin: Secondary | ICD-10-CM | POA: Diagnosis not present

## 2019-08-23 DIAGNOSIS — Z8701 Personal history of pneumonia (recurrent): Secondary | ICD-10-CM | POA: Diagnosis not present

## 2019-08-23 DIAGNOSIS — Z8711 Personal history of peptic ulcer disease: Secondary | ICD-10-CM | POA: Diagnosis not present

## 2019-08-23 DIAGNOSIS — Z952 Presence of prosthetic heart valve: Secondary | ICD-10-CM | POA: Diagnosis not present

## 2019-08-23 DIAGNOSIS — H9190 Unspecified hearing loss, unspecified ear: Secondary | ICD-10-CM | POA: Diagnosis not present

## 2019-08-23 DIAGNOSIS — G2581 Restless legs syndrome: Secondary | ICD-10-CM | POA: Diagnosis not present

## 2019-08-23 DIAGNOSIS — I2510 Atherosclerotic heart disease of native coronary artery without angina pectoris: Secondary | ICD-10-CM | POA: Diagnosis not present

## 2019-08-23 DIAGNOSIS — I110 Hypertensive heart disease with heart failure: Secondary | ICD-10-CM | POA: Diagnosis not present

## 2019-08-23 DIAGNOSIS — E039 Hypothyroidism, unspecified: Secondary | ICD-10-CM | POA: Diagnosis not present

## 2019-08-23 DIAGNOSIS — D509 Iron deficiency anemia, unspecified: Secondary | ICD-10-CM | POA: Diagnosis not present

## 2019-08-23 DIAGNOSIS — N400 Enlarged prostate without lower urinary tract symptoms: Secondary | ICD-10-CM | POA: Diagnosis not present

## 2019-08-23 DIAGNOSIS — I5033 Acute on chronic diastolic (congestive) heart failure: Secondary | ICD-10-CM | POA: Diagnosis not present

## 2019-08-23 DIAGNOSIS — Z951 Presence of aortocoronary bypass graft: Secondary | ICD-10-CM | POA: Diagnosis not present

## 2019-08-23 DIAGNOSIS — F419 Anxiety disorder, unspecified: Secondary | ICD-10-CM | POA: Diagnosis not present

## 2019-08-23 DIAGNOSIS — E785 Hyperlipidemia, unspecified: Secondary | ICD-10-CM | POA: Diagnosis not present

## 2019-08-23 DIAGNOSIS — Z87440 Personal history of urinary (tract) infections: Secondary | ICD-10-CM | POA: Diagnosis not present

## 2019-08-23 NOTE — Telephone Encounter (Signed)
Message Routed to PCP CMA. Please advise

## 2019-08-23 NOTE — Telephone Encounter (Signed)
Cara with [facility] calling to request verbal orders   Nursing with the frequency of  1w4 Raymond Swanson would also know if Dr. Jerilee Hoh would like them to recheck hemoglobin?    Also requesting approval for patients staples in his head to be taken out on Monday.   VM ok

## 2019-08-24 ENCOUNTER — Telehealth: Payer: Self-pay | Admitting: Internal Medicine

## 2019-08-24 NOTE — Telephone Encounter (Signed)
Okay for verbal orders? Please advise 

## 2019-08-24 NOTE — Telephone Encounter (Signed)
Yes for HH, ok for Hb check.

## 2019-08-24 NOTE — Telephone Encounter (Signed)
Dorothea Ogle, PT with [facility]  Requesting VO  PT  Frequency: 1w8   VM ok.

## 2019-08-24 NOTE — Telephone Encounter (Signed)
Verbal orders given to Cara. 

## 2019-08-29 DIAGNOSIS — R6889 Other general symptoms and signs: Secondary | ICD-10-CM | POA: Diagnosis not present

## 2019-08-29 DIAGNOSIS — I5033 Acute on chronic diastolic (congestive) heart failure: Secondary | ICD-10-CM | POA: Diagnosis not present

## 2019-08-29 NOTE — Telephone Encounter (Signed)
Verbal orders given to [facility].

## 2019-08-29 NOTE — Telephone Encounter (Signed)
OK for PT orders.  

## 2019-08-30 ENCOUNTER — Telehealth: Payer: Self-pay | Admitting: Internal Medicine

## 2019-08-30 NOTE — Telephone Encounter (Signed)
Pt refused Social Service visit and will continue Nursing and PT/ please advise

## 2019-08-30 NOTE — Telephone Encounter (Signed)
Ok

## 2019-08-31 ENCOUNTER — Other Ambulatory Visit: Payer: Self-pay | Admitting: Internal Medicine

## 2019-08-31 ENCOUNTER — Telehealth: Payer: Self-pay | Admitting: Internal Medicine

## 2019-08-31 NOTE — Telephone Encounter (Signed)
Message Routed to PCP CMA 

## 2019-08-31 NOTE — Telephone Encounter (Signed)
O2 to maintain sats >90%. If >5L O2 requirement or SOB, needs ED evaluation stat.

## 2019-08-31 NOTE — Telephone Encounter (Signed)
Copied from [place] [phone]. Topic: General - Other >> Aug 31, 2019  1:54 PM Leward Quan A wrote: Reason for CRM: Dorothea Ogle with [facility] called to inform Dr Jerilee Hoh that patient resting 02 saturation was between 88-91 and after going to the restroom sat's dropped to the mid 80 s 85 walking about 20 feet. Asking should he go back on the supplemental oxygen and if so how many liters or should they just continue monitoring it. All other vitals within normal limits today on this visit. Ph# [phone]

## 2019-08-31 NOTE — Telephone Encounter (Signed)
Verbal orders given to [facility]

## 2019-09-05 ENCOUNTER — Ambulatory Visit: Payer: Medicare Other | Admitting: Psychology

## 2019-09-07 ENCOUNTER — Encounter: Payer: Self-pay | Admitting: Internal Medicine

## 2019-09-07 ENCOUNTER — Other Ambulatory Visit: Payer: Self-pay

## 2019-09-07 ENCOUNTER — Ambulatory Visit (INDEPENDENT_AMBULATORY_CARE_PROVIDER_SITE_OTHER): Payer: Medicare Other | Admitting: Internal Medicine

## 2019-09-07 VITALS — BP 110/64 | HR 66 | Temp 97.6°F | Wt 126.0 lb

## 2019-09-07 DIAGNOSIS — I350 Nonrheumatic aortic (valve) stenosis: Secondary | ICD-10-CM

## 2019-09-07 DIAGNOSIS — J9611 Chronic respiratory failure with hypoxia: Secondary | ICD-10-CM | POA: Diagnosis not present

## 2019-09-07 DIAGNOSIS — D500 Iron deficiency anemia secondary to blood loss (chronic): Secondary | ICD-10-CM

## 2019-09-07 DIAGNOSIS — Z9181 History of falling: Secondary | ICD-10-CM

## 2019-09-07 DIAGNOSIS — D649 Anemia, unspecified: Secondary | ICD-10-CM | POA: Diagnosis not present

## 2019-09-07 DIAGNOSIS — Z952 Presence of prosthetic heart valve: Secondary | ICD-10-CM

## 2019-09-07 DIAGNOSIS — F339 Major depressive disorder, recurrent, unspecified: Secondary | ICD-10-CM

## 2019-09-07 LAB — POCT HEMOGLOBIN: Hemoglobin: 11.5 g/dL (ref 11–14.6)

## 2019-09-07 MED ORDER — SERTRALINE HCL 50 MG PO TABS: 25.0000 mg | ORAL_TABLET | Freq: Every day | ORAL | 1 refills | Status: AC

## 2019-09-07 NOTE — Patient Instructions (Signed)
-Nice seeing you today!!  -Schedule follow up in 3 months.  We are committed to keeping you informed about the COVID-19 vaccine.  As the vaccine continues to become available for each phase, we will ensure that patients who meet the criteria receive the information they need to access vaccination opportunities. Continue to check your MyChart account and [email or web address] for updates. Please review the Phase 1b information below.  Following [place]'s guidelines for the distribution of COVID-19 vaccines, we are pleased to share our plans to begin offering vaccines to those 75 and older (Phase 1b). Here are details of those plans:  [place] On Tuesday, Jan. 19, the [facility]) and [place] begin large-scale COVID-19 vaccinations at the [place]. The vaccinations are appointment only and for those 68 and older.  It is expected that 750 will initially be vaccinated per day at the [facility]. Capacity is expected to grow in the weeks ahead. However, the number of reservations accepted depends on the amount of vaccine available.  Online or Phone Registration Only Walk-ins will NOT be accepted. Registration will open on Friday, January 15.   Health Department Registration [facility] residents only)  [place] (Any local residents)  Other Counties We are also working in partnership with county health agencies in [place], [place] and [place] counties to ensure continuing vaccination availability in alignment with state guidelines in the weeks and months ahead. Information on phase 1b COVID-19 vaccination clinics being offered by local county health agencies is provided in the website links below for your convenience:  [place]'s phase 1b vaccination guidelines, prioritizing those 75 and  over as the next eligible group to receive the COVID-19 vaccine, are detailed at MobCommunity.ch.   Vaccine Safety and Effectiveness Clinical trials for the Pfizer COVID-19 vaccine involved 42,000 people and showed that the vaccine is more than 95% effective in preventing COVID-19 with no serious safety concerns. Similar results have been reported for the Moderna COVID-19 vaccine. Side effects reported in the [place] clinical trials include a sore arm at the injection site, fatigue, headache, chills and fever. While side effects from the [place] COVID-19 vaccine are higher than for a typical flu vaccine, they are lower in many ways than side effects from the leading vaccine to prevent shingles. Side effects are signs that a vaccine is working and are related to your immune system being stimulated to produce antibodies against infection. Side effects from vaccination are far less significant than health impacts from COVID-19.  Staying Informed Pharmacists, infectious disease doctors, critical care nurses and other experts at [facility] continue to speak publicly through media interviews and direct communication with our patients and communities about the safety, effectiveness and importance of vaccines to eliminate COVID-19. In addition, reliable information on vaccine safety, effectiveness, side effects and more is available on the following websites:  [place] Department of Health and Human Services COVID-19 Vaccine Information Website.  U.S. Centers for Disease Control and Prevention XX123456 Human resources officer.  Staying Safe We agree with the CDC on what we can do to help our communities get back to normal: Getting "back to normal" is going to take all of our tools. If we use all the tools we have, we stand the best chance of getting our families, communities, schools and workplaces "back to normal" sooner:  Get vaccinated as soon as vaccines become available  within the phase of  the state's vaccination rollout plan for which you meet the eligibility criteria.  Wear a mask.  Stay 6 feet from others and avoid crowds.  Wash hands often.  For our most current information, please visit [email or web address].

## 2019-09-07 NOTE — Progress Notes (Addendum)
Established Patient Office Visit     This visit occurred during the SARS-CoV-2 public health emergency.  Safety protocols were in place, including screening questions prior to the visit, additional usage of staff PPE, and extensive cleaning of exam room while observing appropriate contact time as indicated for disinfecting solutions.    CC/Reason for Visit: Follow-up depression and chronic conditions  HPI: Raymond Swanson is a 86 y.o. male who is coming in today for the above mentioned reasons.  His visit today was mainly scheduled for follow-up depression after being started on Zoloft 25 mg approximately 6 weeks ago.  He states he has done quite well with this medication states he is finally able to sleep during the nighttime, he states his mood is improved.  Several other issues were discussed today.  His oxygen saturation today was noted to be 88%.  I also received a notification from his home health nurse about hypoxemia and oxygen was arranged.  I suspect this is related to his recent TAVR.  He does not like wearing the oxygen tank because it is it is difficult for him to cart back-and-forth.  He is not wearing oxygen today.  He is here today with a friend who assists him with transportation.  She tries to help as much as possible at home but patient is the main caregiver for his demented wife, the 2 of them live alone together.  She has concerns about his safety at home, so do I.  Patient states that he would be willing to go into assisted living, however his wife refuses (I am not sure about her decision-making capacity given her history of advanced dementia per report).  Patient has been trying to get her to go to the doctor for years but she refuses.  About 2 weeks ago Mr. Mcjunkins fell and it took a couple hours before he was able to get up.  No injuries other than skin tears.  Friend is wondering about a life alert, also wondering about a wheelchair for when they go out and about.  It is  important to note that Mr. Iwasaki canceled the home social worker that we had requested.  Friend present today states that she does not have concerns about neglect: meaning house is clean and they do eat mainly microwave meals but she is concerned about home safety.  She is concerned that he still has his car keys.  [name] was hospitalized in December after a fall with a head wound.  During that hospitalization he was found to have a hemoglobin of 6.6 and was admitted to the hospital.  GI was consulted, however given his age he refused endoscopic work-up.  After discussion with cardiology it was decided to take him off his Plavix.   Past Medical/Surgical History: Past Medical History:  Diagnosis Date  . Acute gastric ulcer with hemorrhage, without mention of obstruction 09/10/2009  . ANEMIA 10/08/2009  . CORONARY ARTERY DISEASE 01/26/2007   s/p remote CABG x3V  . Dyspnea   . History of kidney stones   . HOH (hard of hearing)   . Hx of bladder cancer    s/p TURBT by Dr. Lovena Neighbours  . HYPERLIPIDEMIA 01/26/2007  . HYPERTENSION 01/26/2007  . HYPOTHYROIDISM 10/18/2007  . PROSTATE CANCER, HX OF 01/26/2007  . SKIN CANCER, HX OF 01/26/2007  . Subdural hematoma ([place]) 2016   fall from deck while cleaning gutters; sustained subdemral hematoma ; doing ok now     Past Surgical  History:  Procedure Laterality Date  . CATARACT EXTRACTION    . CORONARY ARTERY BYPASS GRAFT     1991; reports at PAT appt 10-11-17: i went to my priamry doctor for a physical and had aroutine stress test that was bad, was sent for cath ( more than 1 but nsure how many or if stents were placed), he rprots " that didnt work so they did the surgery". denies heart symptoms for over 20 years;  sees  cardiology Hochrein annually for EKGs   . CYSTOSCOPY W/ URETERAL STENT PLACEMENT Right 10/13/2017   Procedure: CYSTOSCOPY WITH STENT REPLACEMENT;  Surgeon: Ceasar Mons, MD;  Location: [facility];  Service: Urology;  Laterality: Right;    . EXCISION MASS UPPER EXTREMETIES Left 08/11/2016   Procedure: EXCISION MASS LEFT SHOULDER, LEFT FOREARM, LEFT WRIST;  Surgeon: Judeth Horn, MD;  Location: [facility];  Service: General;  Laterality: Left;  . HERNIA REPAIR     ingunial  . MOHS SURGERY    . PROSTATE SURGERY     prostatectomy  . RIGHT/LEFT HEART CATH AND CORONARY/GRAFT ANGIOGRAPHY N/A 04/28/2019   Procedure: RIGHT/LEFT HEART CATH AND CORONARY/GRAFT ANGIOGRAPHY;  Surgeon: Sherren Mocha, MD;  Location: [facility];  Service: Cardiovascular;  Laterality: N/A;  . TEE WITHOUT CARDIOVERSION N/A 11/17/2017   Procedure: TRANSESOPHAGEAL ECHOCARDIOGRAM (TEE);  Surgeon: Dixie Dials, MD;  Location: [facility];  Service: Cardiovascular;  Laterality: N/A;  . TEE WITHOUT CARDIOVERSION N/A 05/23/2019   Procedure: TRANSESOPHAGEAL ECHOCARDIOGRAM (TEE);  Surgeon: Sherren Mocha, MD;  Location: [facility];  Service: Open Heart Surgery;  Laterality: N/A;  . TRANSCATHETER AORTIC VALVE REPLACEMENT, TRANSFEMORAL N/A 05/23/2019   Procedure: TRANSCATHETER AORTIC VALVE REPLACEMENT, TRANSFEMORAL;  Surgeon: Sherren Mocha, MD;  Location: [facility];  Service: Open Heart Surgery;  Laterality: N/A;  . TRANSURETHRAL RESECTION OF BLADDER TUMOR N/A 10/13/2017   Procedure: TRANSURETHRAL RESECTION OF BLADDER TUMOR/ BIPOLAR (TURBT);  Surgeon: Ceasar Mons, MD;  Location: [facility];  Service: Urology;  Laterality: N/A;  ONLY NEEDS 90 MIN FOR BOTH PROCEDURES    Social History:  reports that he quit smoking about 70 years ago. His smoking use included cigarettes. He has a 20.00 pack-year smoking history. He has never used smokeless tobacco. He reports previous alcohol use. He reports that he does not use drugs.  Allergies: No Known Allergies  Family History:  Family History  Problem Relation Age of Onset  . Lung cancer Brother 37  . GI problems Sister      Current Outpatient Medications:  .  acetaminophen (TYLENOL) 500 MG  tablet, Take 500-1,000 mg by mouth every 6 (six) hours as needed for moderate pain or headache., Disp: , Rfl:  .  aspirin EC 81 MG tablet, Take 81 mg by mouth at bedtime. , Disp: , Rfl:  .  CALCIUM PO, Take 1 capsule by mouth daily., Disp: , Rfl:  .  carboxymethylcellulose (REFRESH TEARS) 0.5 % SOLN, Place 1 drop into both eyes 3 (three) times daily as needed (dry eyes)., Disp: , Rfl:  .  carvedilol (COREG) 3.125 MG tablet, Take 3.125-6.25 mg by mouth See admin instructions. Take 6.25 mg by mouth in the morning and 3.125 mg by mouth in the evening , Disp: , Rfl:  .  Cyanocobalamin (B-12 PO), Take 1 capsule by mouth daily., Disp: , Rfl:  .  ferrous sulfate 325 (65 FE) MG tablet, Take 1 tablet (325 mg total) by mouth daily with breakfast., Disp: 30 tablet, Rfl:  3 .  furosemide (LASIX) 20 MG tablet, Take 20 mg by mouth daily., Disp: , Rfl:  .  levothyroxine (SYNTHROID) 75 MCG tablet, TAKE 1 TABLET(75 MCG) BY MOUTH DAILY, Disp: 90 tablet, Rfl: 1 .  MELATONIN PO, Take 1 tablet by mouth at bedtime. , Disp: , Rfl:  .  Multiple Vitamin (MULTIVITAMIN WITH MINERALS) TABS tablet, Take 1 tablet by mouth daily., Disp: , Rfl:  .  Multiple Vitamins-Minerals (PRESERVISION AREDS 2 PO), Take 1 tablet by mouth 2 (two) times daily. , Disp: , Rfl:  .  nitroGLYCERIN (NITROSTAT) 0.4 MG SL tablet, Place 0.4 mg under the tongue every 5 (five) minutes as needed for chest pain., Disp: , Rfl:  .  pantoprazole (PROTONIX) 40 MG tablet, Take 1 tablet (40 mg total) by mouth daily., Disp: 30 tablet, Rfl: 1 .  pramipexole (MIRAPEX) 0.25 MG tablet, Take 0.25 mg by mouth at bedtime., Disp: , Rfl:  .  sertraline (ZOLOFT) 50 MG tablet, Take 0.5 tablets (25 mg total) by mouth daily., Disp: 90 tablet, Rfl: 1 .  tamsulosin (FLOMAX) 0.4 MG CAPS capsule, Take 0.4 mg by mouth daily., Disp: , Rfl:  .  VITAMIN D PO, Take 1 capsule by mouth daily., Disp: , Rfl:   Review of Systems:  Constitutional: Denies fever, chills, diaphoresis, appetite  change and fatigue.  HEENT: Denies photophobia, eye pain, redness, hearing loss, ear pain, congestion, sore throat, rhinorrhea, sneezing, mouth sores, trouble swallowing, neck pain, neck stiffness and tinnitus.   Respiratory: Denies SOB, DOE, cough, chest tightness,  and wheezing.   Cardiovascular: Denies chest pain, palpitations and leg swelling.  Gastrointestinal: Denies nausea, vomiting, abdominal pain, diarrhea, constipation, blood in stool and abdominal distention.  Genitourinary: Denies dysuria, urgency, frequency, hematuria, flank pain and difficulty urinating.  Endocrine: Denies: hot or cold intolerance, sweats, changes in hair or nails, polyuria, polydipsia. Musculoskeletal: Denies myalgias, back pain, joint swelling, arthralgias and gait problem.  Skin: Denies pallor, rash and wound.  Neurological: Denies dizziness, seizures, syncope, light-headedness, numbness and headaches.  Hematological: Denies adenopathy. Easy bruising, personal or family bleeding history  Psychiatric/Behavioral: Denies suicidal ideation, mood changes, confusion, nervousness, sleep disturbance and agitation    Physical Exam: Vitals:   09/07/19 1401  BP: 110/64  Pulse: 66  Temp: 97.6 F (36.4 C)  TempSrc: Temporal  SpO2: (!) 88%  Weight: 126 lb (57.2 kg)    Body mass index is 19.16 kg/m.   Constitutional: NAD, calm, comfortable Eyes: PERRL, lids and conjunctivae normal ENMT: Mucous membranes are moist.  Extremely hard of hearing.  Respiratory: clear to auscultation bilaterally, no wheezing, no crackles.  Increased respiratory effort, no accessory muscle use. Cardiovascular: Regular rate and rhythm, no extremity edema. Psychiatric: Normal judgment and insight. Alert and oriented x 3. Normal mood.    Impression and Plan:  Iron deficiency anemia due to chronic blood loss -Fingerstick hemoglobin today is 11.5.  Chronic hypoxemic respiratory failure ([place]) -He is noticed to have increased work of  breathing today, his oxygen saturation was 88% on room air.  He has no other symptoms.  Denies chest pain.  Denies URI symptoms.  He has been having this issue since before his TAVR.  He already has oxygen set up at home that he does not wear because carrying the tank is cumbersome.  He states "I only get short of breath when I exert myself and when I do I am where my oxygen for a minute or 2 and then I get better so I take  it off". -Have advised him to use his oxygen 24/7, I doubt that he will.  Severe aortic stenosis History of transcatheter aortic valve replacement (TAVR)  -He feels like he has not recovered as he anticipated that he would after TAVR.  He thought that he would be less short of breath and less week.  It is important to note that he only is no longer having syncopal episodes. -Continue follow-up with cardiology as scheduled.  Depression, recurrent ([place])  -He feels like he has had significant improvement on Zoloft low-dose 25 mg, will continue.  At high risk for falls  - Plan: DME Wheelchair manual -Friend will look into life alert systems. -We have talked extensively about ALF, he thinks it would be a good idea but his wife refuses. -He has decided that he will stay at his home until a significant medical event puts either him or her in a nursing home.  I do not think APS will be helpful as per friend they have enough food and house is clean, he is still able to someone care for himself although he needs some assistance with basic tasks given his age and frailty.  We will continue to address.   Patient has mobility limitations which cannot be resolved with cane, crutch or walker. Patient can safely self propel the wheelchair or has a caregiver who can assist at all times.     Patient Instructions  -Nice seeing you today!!  -Schedule follow up in 3 months.  We are committed to keeping you informed about the COVID-19 vaccine.  As the vaccine continues to become available  for each phase, we will ensure that patients who meet the criteria receive the information they need to access vaccination opportunities. Continue to check your MyChart account and [email or web address] for updates. Please review the Phase 1b information below.  Following [place]'s guidelines for the distribution of COVID-19 vaccines, we are pleased to share our plans to begin offering vaccines to those 75 and older (Phase 1b). Here are details of those plans:  [place] On Tuesday, Jan. 19, the [facility]) and [place] begin large-scale COVID-19 vaccinations at the [place]. The vaccinations are appointment only and for those 66 and older.  It is expected that 750 will initially be vaccinated per day [facility]. Capacity is expected to grow in the weeks ahead. However, the number of reservations accepted depends on the amount of vaccine available.  Online or Phone Registration Only Walk-ins will NOT be accepted. Registration will open on Friday, January 15.   Health Department Registration [facility] residents only)  [place] (Any local residents)  Other Counties We are also working in partnership with county health agencies in [place], [place] and [place] counties to ensure continuing vaccination availability in alignment with state guidelines in the weeks and months ahead. Information on phase 1b COVID-19 vaccination clinics being offered by local county health agencies is provided in the website links below for your convenience:  [place] 's phase 1b vaccination guidelines, prioritizing those 75 and over as the next eligible group to receive the COVID-19 vaccine, are detailed at [email or web address].[email or web address].   Vaccine Safety and Effectiveness Clinical trials for the Pfizer  COVID-19 vaccine involved 42,000 people and showed that the vaccine is more than 95% effective in preventing COVID-19 with no serious safety concerns. Similar results have been reported  for the Moderna COVID-19 vaccine. Side effects reported in the [place] clinical trials include a sore arm at the injection site, fatigue, headache, chills and fever. While side effects from the [place] COVID-19 vaccine are higher than for a typical flu vaccine, they are lower in many ways than side effects from the leading vaccine to prevent shingles. Side effects are signs that a vaccine is working and are related to your immune system being stimulated to produce antibodies against infection. Side effects from vaccination are far less significant than health impacts from COVID-19.  Staying Informed Pharmacists, infectious disease doctors, critical care nurses and other experts at [facility] continue to speak publicly through media interviews and direct communication with our patients and communities about the safety, effectiveness and importance of vaccines to eliminate COVID-19. In addition, reliable information on vaccine safety, effectiveness, side effects and more is available on the following websites:  [place] Department of Health and Human Services COVID-19 Vaccine Information Website.  U.S. Centers for Disease Control and Prevention [id number] Human resources officer.  Staying Safe We agree with the CDC on what we can do to help our communities get back to normal: Getting "back to normal" is going to take all of our tools. If we use all the tools we have, we stand the best chance of getting our families, communities, schools and workplaces "back to normal" sooner:  Get vaccinated as soon as vaccines become available within the phase of the state's vaccination rollout plan for which you meet the eligibility criteria.  Wear a mask.  Stay 6 feet from others and avoid crowds.  Wash hands often.  For our  most current information, please visit [email or web address].      Lelon Frohlich, MD [facility] at [facility]

## 2019-09-18 ENCOUNTER — Encounter (INDEPENDENT_AMBULATORY_CARE_PROVIDER_SITE_OTHER): Payer: Medicare Other | Admitting: Ophthalmology

## 2019-09-18 DIAGNOSIS — H43813 Vitreous degeneration, bilateral: Secondary | ICD-10-CM

## 2019-09-18 DIAGNOSIS — H35033 Hypertensive retinopathy, bilateral: Secondary | ICD-10-CM

## 2019-09-18 DIAGNOSIS — I10 Essential (primary) hypertension: Secondary | ICD-10-CM

## 2019-09-18 DIAGNOSIS — H353231 Exudative age-related macular degeneration, bilateral, with active choroidal neovascularization: Secondary | ICD-10-CM | POA: Diagnosis not present

## 2019-09-20 ENCOUNTER — Telehealth: Payer: Self-pay | Admitting: Internal Medicine

## 2019-09-20 NOTE — Telephone Encounter (Signed)
Dorothea Ogle is a PT at [employer] on behalf of the pt. Pt requested a Medical Social Worker Evaluation to get to know more about setting up a living will and advance directive. Dorothea Ogle is needing verbal orders from [facility]. Dorothea Ogle can be contacted at [phone] and it is ok to leave a vm if he does not answer

## 2019-09-21 NOTE — Telephone Encounter (Signed)
[place] for [facility]

## 2019-09-21 NOTE — Telephone Encounter (Signed)
Left detailed message on machine for [facility] with orders.

## 2019-09-22 DIAGNOSIS — Z7982 Long term (current) use of aspirin: Secondary | ICD-10-CM | POA: Diagnosis not present

## 2019-09-22 DIAGNOSIS — Z87440 Personal history of urinary (tract) infections: Secondary | ICD-10-CM | POA: Diagnosis not present

## 2019-09-22 DIAGNOSIS — E039 Hypothyroidism, unspecified: Secondary | ICD-10-CM | POA: Diagnosis not present

## 2019-09-22 DIAGNOSIS — H9190 Unspecified hearing loss, unspecified ear: Secondary | ICD-10-CM | POA: Diagnosis not present

## 2019-09-22 DIAGNOSIS — I110 Hypertensive heart disease with heart failure: Secondary | ICD-10-CM | POA: Diagnosis not present

## 2019-09-22 DIAGNOSIS — D509 Iron deficiency anemia, unspecified: Secondary | ICD-10-CM | POA: Diagnosis not present

## 2019-09-22 DIAGNOSIS — N400 Enlarged prostate without lower urinary tract symptoms: Secondary | ICD-10-CM | POA: Diagnosis not present

## 2019-09-22 DIAGNOSIS — Z8711 Personal history of peptic ulcer disease: Secondary | ICD-10-CM | POA: Diagnosis not present

## 2019-09-22 DIAGNOSIS — G2581 Restless legs syndrome: Secondary | ICD-10-CM | POA: Diagnosis not present

## 2019-09-22 DIAGNOSIS — Z952 Presence of prosthetic heart valve: Secondary | ICD-10-CM | POA: Diagnosis not present

## 2019-09-22 DIAGNOSIS — E785 Hyperlipidemia, unspecified: Secondary | ICD-10-CM | POA: Diagnosis not present

## 2019-09-22 DIAGNOSIS — Z8701 Personal history of pneumonia (recurrent): Secondary | ICD-10-CM | POA: Diagnosis not present

## 2019-09-22 DIAGNOSIS — I5033 Acute on chronic diastolic (congestive) heart failure: Secondary | ICD-10-CM | POA: Diagnosis not present

## 2019-09-22 DIAGNOSIS — F419 Anxiety disorder, unspecified: Secondary | ICD-10-CM | POA: Diagnosis not present

## 2019-09-22 DIAGNOSIS — I2510 Atherosclerotic heart disease of native coronary artery without angina pectoris: Secondary | ICD-10-CM | POA: Diagnosis not present

## 2019-09-22 DIAGNOSIS — Z951 Presence of aortocoronary bypass graft: Secondary | ICD-10-CM | POA: Diagnosis not present

## 2019-09-28 DIAGNOSIS — C672 Malignant neoplasm of lateral wall of bladder: Secondary | ICD-10-CM | POA: Diagnosis not present

## 2019-09-28 DIAGNOSIS — R310 Gross hematuria: Secondary | ICD-10-CM | POA: Diagnosis not present

## 2019-09-29 DIAGNOSIS — I5033 Acute on chronic diastolic (congestive) heart failure: Secondary | ICD-10-CM | POA: Diagnosis not present

## 2019-10-18 ENCOUNTER — Other Ambulatory Visit: Payer: Self-pay | Admitting: Internal Medicine

## 2019-10-27 DIAGNOSIS — I5033 Acute on chronic diastolic (congestive) heart failure: Secondary | ICD-10-CM | POA: Diagnosis not present

## 2019-10-30 ENCOUNTER — Other Ambulatory Visit: Payer: Self-pay | Admitting: Internal Medicine

## 2019-11-27 DIAGNOSIS — I5033 Acute on chronic diastolic (congestive) heart failure: Secondary | ICD-10-CM | POA: Diagnosis not present

## 2019-12-01 ENCOUNTER — Other Ambulatory Visit: Payer: Self-pay | Admitting: Physician Assistant

## 2019-12-01 ENCOUNTER — Other Ambulatory Visit: Payer: Self-pay | Admitting: Internal Medicine

## 2019-12-06 ENCOUNTER — Other Ambulatory Visit: Payer: Self-pay

## 2019-12-07 ENCOUNTER — Encounter: Payer: Self-pay | Admitting: Internal Medicine

## 2019-12-07 ENCOUNTER — Ambulatory Visit (INDEPENDENT_AMBULATORY_CARE_PROVIDER_SITE_OTHER): Payer: Medicare Other | Admitting: Internal Medicine

## 2019-12-07 VITALS — BP 130/68 | HR 67 | Temp 98.3°F | Wt 120.0 lb

## 2019-12-07 DIAGNOSIS — F339 Major depressive disorder, recurrent, unspecified: Secondary | ICD-10-CM | POA: Diagnosis not present

## 2019-12-07 DIAGNOSIS — I10 Essential (primary) hypertension: Secondary | ICD-10-CM

## 2019-12-07 DIAGNOSIS — E039 Hypothyroidism, unspecified: Secondary | ICD-10-CM | POA: Diagnosis not present

## 2019-12-07 DIAGNOSIS — E785 Hyperlipidemia, unspecified: Secondary | ICD-10-CM

## 2019-12-07 NOTE — Progress Notes (Signed)
Established Patient Office Visit     This visit occurred during the SARS-CoV-2 public health emergency.  Safety protocols were in place, including screening questions prior to the visit, additional usage of staff PPE, and extensive cleaning of exam room while observing appropriate contact time as indicated for disinfecting solutions.    CC/Reason for Visit: 27-month follow-up chronic medical conditions  HPI: Raymond Swanson is a 86 y.o. male who is coming in today for the above mentioned reasons. Past Medical History is significant for: Depression, aortic stenosis status post TAVR.  He has been doing well since her last visit.  He was out of medications for a while as his pharmacy was not delivering and he has no way to pick his medications up.  He changed pharmacy and is now getting them delivered.  He wonders if there is an easier way to obtain his medications.  He is here with his friend who assists with transportation.  He states that he physically feels well but mentally gets "bored" a lot.  He has "nothing to do".   Past Medical/Surgical History: Past Medical History:  Diagnosis Date  . Acute gastric ulcer with hemorrhage, without mention of obstruction 09/10/2009  . ANEMIA 10/08/2009  . CORONARY ARTERY DISEASE 01/26/2007   s/p remote CABG x3V  . Dyspnea   . History of kidney stones   . HOH (hard of hearing)   . Hx of bladder cancer    s/p TURBT by Dr. Lovena Neighbours  . HYPERLIPIDEMIA 01/26/2007  . HYPERTENSION 01/26/2007  . HYPOTHYROIDISM 10/18/2007  . PROSTATE CANCER, HX OF 01/26/2007  . SKIN CANCER, HX OF 01/26/2007  . Subdural hematoma ([place]) 2016   fall from deck while cleaning gutters; sustained subdemral hematoma ; doing ok now     Past Surgical History:  Procedure Laterality Date  . CATARACT EXTRACTION    . CORONARY ARTERY BYPASS GRAFT     1991; reports at PAT appt 10-11-17: i went to my priamry doctor for a physical and had aroutine stress test that was bad, was sent for cath (  more than 1 but nsure how many or if stents were placed), he rprots " that didnt work so they did the surgery". denies heart symptoms for over 20 years;  sees  cardiology Hochrein annually for EKGs   . CYSTOSCOPY W/ URETERAL STENT PLACEMENT Right 10/13/2017   Procedure: CYSTOSCOPY WITH STENT REPLACEMENT;  Surgeon: Ceasar Mons, MD;  Location: [facility];  Service: Urology;  Laterality: Right;  . EXCISION MASS UPPER EXTREMETIES Left 08/11/2016   Procedure: EXCISION MASS LEFT SHOULDER, LEFT FOREARM, LEFT WRIST;  Surgeon: Judeth Horn, MD;  Location: [facility];  Service: General;  Laterality: Left;  . HERNIA REPAIR     ingunial  . MOHS SURGERY    . PROSTATE SURGERY     prostatectomy  . RIGHT/LEFT HEART CATH AND CORONARY/GRAFT ANGIOGRAPHY N/A 04/28/2019   Procedure: RIGHT/LEFT HEART CATH AND CORONARY/GRAFT ANGIOGRAPHY;  Surgeon: Sherren Mocha, MD;  Location: [facility];  Service: Cardiovascular;  Laterality: N/A;  . TEE WITHOUT CARDIOVERSION N/A 11/17/2017   Procedure: TRANSESOPHAGEAL ECHOCARDIOGRAM (TEE);  Surgeon: Dixie Dials, MD;  Location: [facility];  Service: Cardiovascular;  Laterality: N/A;  . TEE WITHOUT CARDIOVERSION N/A 05/23/2019   Procedure: TRANSESOPHAGEAL ECHOCARDIOGRAM (TEE);  Surgeon: Sherren Mocha, MD;  Location: [facility];  Service: Open Heart Surgery;  Laterality: N/A;  . TRANSCATHETER AORTIC VALVE REPLACEMENT, TRANSFEMORAL N/A 05/23/2019   Procedure: TRANSCATHETER AORTIC VALVE  REPLACEMENT, TRANSFEMORAL;  Surgeon: Sherren Mocha, MD;  Location: [facility];  Service: Open Heart Surgery;  Laterality: N/A;  . TRANSURETHRAL RESECTION OF BLADDER TUMOR N/A 10/13/2017   Procedure: TRANSURETHRAL RESECTION OF BLADDER TUMOR/ BIPOLAR (TURBT);  Surgeon: Ceasar Mons, MD;  Location: [facility];  Service: Urology;  Laterality: N/A;  ONLY NEEDS 90 MIN FOR BOTH PROCEDURES    Social History:  reports that he quit smoking about 71 years ago. His smoking  use included cigarettes. He has a 20.00 pack-year smoking history. He has never used smokeless tobacco. He reports previous alcohol use. He reports that he does not use drugs.  Allergies: No Known Allergies  Family History:  Family History  Problem Relation Age of Onset  . Lung cancer Brother 15  . GI problems Sister      Current Outpatient Medications:  .  acetaminophen (TYLENOL) 500 MG tablet, Take 500-1,000 mg by mouth every 6 (six) hours as needed for moderate pain or headache., Disp: , Rfl:  .  aspirin EC 81 MG tablet, Take 81 mg by mouth at bedtime. , Disp: , Rfl:  .  CALCIUM PO, Take 1 capsule by mouth daily., Disp: , Rfl:  .  carboxymethylcellulose (REFRESH TEARS) 0.5 % SOLN, Place 1 drop into both eyes 3 (three) times daily as needed (dry eyes)., Disp: , Rfl:  .  carvedilol (COREG) 3.125 MG tablet, Take 3.125-6.25 mg by mouth See admin instructions. Take 6.25 mg by mouth in the morning and 3.125 mg by mouth in the evening , Disp: , Rfl:  .  clopidogrel (PLAVIX) 75 MG tablet, TAKE 1 TABLET EVERY DAY WITH BREAKFAST, Disp: 90 tablet, Rfl: 3 .  Cyanocobalamin (B-12 PO), Take 1 capsule by mouth daily., Disp: , Rfl:  .  ferrous sulfate 325 (65 FE) MG tablet, Take 1 tablet (325 mg total) by mouth daily with breakfast., Disp: 30 tablet, Rfl: 3 .  furosemide (LASIX) 20 MG tablet, Take 20 mg by mouth daily., Disp: , Rfl:  .  levothyroxine (SYNTHROID) 75 MCG tablet, TAKE 1 TABLET(75 MCG) BY MOUTH DAILY, Disp: 90 tablet, Rfl: 1 .  MELATONIN PO, Take 1 tablet by mouth at bedtime. , Disp: , Rfl:  .  Multiple Vitamin (MULTIVITAMIN WITH MINERALS) TABS tablet, Take 1 tablet by mouth daily., Disp: , Rfl:  .  Multiple Vitamins-Minerals (PRESERVISION AREDS 2 PO), Take 1 tablet by mouth 2 (two) times daily. , Disp: , Rfl:  .  nitroGLYCERIN (NITROSTAT) 0.4 MG SL tablet, Place 0.4 mg under the tongue every 5 (five) minutes as needed for chest pain., Disp: , Rfl:  .  pantoprazole (PROTONIX) 40 MG  tablet, Take 1 tablet (40 mg total) by mouth daily., Disp: 30 tablet, Rfl: 1 .  pramipexole (MIRAPEX) 0.25 MG tablet, TAKE 1 TABLET AT BEDTIME, Disp: 90 tablet, Rfl: 0 .  sertraline (ZOLOFT) 50 MG tablet, Take 0.5 tablets (25 mg total) by mouth daily., Disp: 90 tablet, Rfl: 1 .  tamsulosin (FLOMAX) 0.4 MG CAPS capsule, Take 0.4 mg by mouth daily., Disp: , Rfl:  .  VITAMIN D PO, Take 1 capsule by mouth daily., Disp: , Rfl:   Review of Systems:  Constitutional: Denies fever, chills, diaphoresis, appetite change and fatigue.  HEENT: Denies photophobia, eye pain, redness, hearing loss, ear pain, congestion, sore throat, rhinorrhea, sneezing, mouth sores, trouble swallowing, neck pain, neck stiffness and tinnitus.   Respiratory: Denies SOB, DOE, cough, chest tightness,  and wheezing.   Cardiovascular: Denies chest pain,  palpitations and leg swelling.  Gastrointestinal: Denies nausea, vomiting, abdominal pain, diarrhea, constipation, blood in stool and abdominal distention.  Genitourinary: Denies dysuria, urgency, frequency, hematuria, flank pain and difficulty urinating.  Endocrine: Denies: hot or cold intolerance, sweats, changes in hair or nails, polyuria, polydipsia. Musculoskeletal: Denies myalgias, back pain, joint swelling, arthralgias and gait problem.  Skin: Denies pallor, rash and wound.  Neurological: Denies dizziness, seizures, syncope, weakness, light-headedness, numbness and headaches.  Hematological: Denies adenopathy. Easy bruising, personal or family bleeding history  Psychiatric/Behavioral: Denies suicidal ideation, mood changes, confusion, nervousness, sleep disturbance and agitation    Physical Exam: Vitals:   12/07/19 1359  BP: 130/68  Pulse: 67  Temp: 98.3 F (36.8 C)  TempSrc: Temporal  SpO2: 91%  Weight: 120 lb (54.4 kg)    Body mass index is 18.25 kg/m.   Constitutional: NAD, calm, comfortable Eyes: PERRL, lids and conjunctivae normal, wears corrective  lenses ENMT: Mucous membranes are moist.  Respiratory: clear to auscultation bilaterally, no wheezing, no crackles. Normal respiratory effort. No accessory muscle use.  Cardiovascular: Regular rate and rhythm, no murmurs / rubs / gallops. No extremity edema.  Neurologic: Grossly intact and nonfocal, ambulates with a walker Psychiatric: Normal judgment and insight. Alert and oriented x 3. Normal mood.    Impression and Plan:  Essential hypertension -Well-controlled, continue current medications.  Acquired hypothyroidism -Check TSH next visit, continue levothyroxine.  Dyslipidemia -Last LDL was 42 in 2019.  Depression, recurrent ([place]) -Feels his mood is stable on Lexapro.  We will contact pharmacy to see if we can pill pack his medications for ease of administration.    Patient Instructions  -Nice seeing you today!!  -We [facility] to see if we can [facility] your medications.  -See you back in 4 months.     Lelon Frohlich, MD [facility]

## 2019-12-07 NOTE — Patient Instructions (Signed)
-  Nice seeing you today!!  -We will [facility] to see if we can pill pack your medications.  -See you back in 4 months.

## 2019-12-08 ENCOUNTER — Other Ambulatory Visit: Payer: Self-pay | Admitting: Internal Medicine

## 2019-12-08 ENCOUNTER — Ambulatory Visit: Payer: Self-pay | Admitting: *Deleted

## 2019-12-08 DIAGNOSIS — I350 Nonrheumatic aortic (valve) stenosis: Secondary | ICD-10-CM

## 2019-12-08 DIAGNOSIS — G2581 Restless legs syndrome: Secondary | ICD-10-CM

## 2019-12-08 DIAGNOSIS — F339 Major depressive disorder, recurrent, unspecified: Secondary | ICD-10-CM

## 2019-12-08 DIAGNOSIS — E039 Hypothyroidism, unspecified: Secondary | ICD-10-CM

## 2019-12-08 DIAGNOSIS — Z9181 History of falling: Secondary | ICD-10-CM

## 2019-12-08 DIAGNOSIS — E785 Hyperlipidemia, unspecified: Secondary | ICD-10-CM

## 2019-12-08 DIAGNOSIS — D649 Anemia, unspecified: Secondary | ICD-10-CM

## 2019-12-08 DIAGNOSIS — I10 Essential (primary) hypertension: Secondary | ICD-10-CM

## 2019-12-08 NOTE — Chronic Care Management (AMB) (Signed)
  Chronic Care Management   Note  12/08/2019 Name: Raymond Swanson MRN: [id number] DOB: [birth year]  Central Pharmacy Referral made for assistance with medication pill packs.   Follow up plan: The Central Pharmacy team will follow up with the patient and will provide direct communication to the PCP for this patient.   [facility] Management Coordinator Direct Dial:  [phone]  Fax: [phone]

## 2019-12-11 ENCOUNTER — Telehealth: Payer: Self-pay | Admitting: Internal Medicine

## 2019-12-11 NOTE — Telephone Encounter (Signed)
Pt would like a call back about his medication. Pt is aware Dr. Jerilee Hoh is not in the office today.

## 2019-12-12 ENCOUNTER — Other Ambulatory Visit: Payer: Self-pay

## 2019-12-12 ENCOUNTER — Telehealth: Payer: Self-pay | Admitting: *Deleted

## 2019-12-12 MED ORDER — PANTOPRAZOLE SODIUM 40 MG PO TBEC: 40.0000 mg | DELAYED_RELEASE_TABLET | Freq: Every day | ORAL | 1 refills | Status: AC

## 2019-12-12 MED ORDER — CARVEDILOL 3.125 MG PO TABS: ORAL_TABLET | ORAL | 1 refills | Status: DC

## 2019-12-12 NOTE — Telephone Encounter (Signed)
Patient is requesting refills

## 2019-12-12 NOTE — Telephone Encounter (Signed)
Refills sent

## 2019-12-12 NOTE — Telephone Encounter (Signed)
Anne Ng will be calling the patient to review his medications

## 2019-12-12 NOTE — Telephone Encounter (Signed)
Contacted patient to review medications. Patient requested refills for pantoprazole and carvedilol asap. Patient also inquired additional help for medication management and delivery since he and his spouse are unable to drive.   Verbal consent obtained for [facility] enhanced pharmacy services (medication synchronization, adherence packaging, delivery coordination).  A medication sync plan is pending and will be created to allow patient to get all medications delivered once every 30 to 90 days per patient preference. Patient understands they have freedom to choose pharmacy and clinical pharmacist will coordinate care between all prescribers and [facility].  Patient will return call to coordinate delivery of his other medications.   Currently patient is requesting delivery of Pantoprazole 40mg  and Carvedilol 3.125mg  for 90 day supply sent to [facility].   I have contacted Dr. Jeneen Rinks Hochrein's office and left message requesting carvedilol refill.   Apolonio Schneiders, can we send in pantoprazole 40mg  refill for 90 day supply sent to [facility]?

## 2019-12-13 ENCOUNTER — Ambulatory Visit: Payer: Self-pay

## 2019-12-13 NOTE — Chronic Care Management (AMB) (Signed)
  Chronic Care Management   Note  12/13/2019 Name: Raymond Swanson MRN: [id number] DOB: 1925/04/10  Raymond Swanson is a 86 y.o. year old male who is a primary care patient of Isaac Bliss, Rayford Halsted, MD. I reached out to Raymond Swanson by phone today in response to a referral sent by Mr. Mourice Calmes Mikami's PCP, Isaac Bliss, Rayford Halsted, MD.   [name] was given information about Chronic Care Management services today including:  1. CCM service includes personalized support from designated clinical staff supervised by his physician, including individualized plan of care and coordination with other care providers 2. 24/7 contact phone numbers for assistance for urgent and routine care needs. 3. Service will only be billed when office clinical staff spend 20 minutes or more in a month to coordinate care. 4. Only one practitioner may furnish and bill the service in a calendar month. 5. The patient may stop CCM services at any time (effective at the end of the month) by phone call to the office staff.  Patient agreed to services and verbal consent obtained.   Follow up plan: Patient had office visit with Dr. Jerilee Hoh 12/07/2019. Will follow up with patient to schedule CCM visit. Patient is hard of hearing and has caregiver for transportation.

## 2019-12-13 NOTE — Chronic Care Management (AMB) (Signed)
Verbal consent obtained for [facility] enhanced pharmacy services (medication synchronization, adherence packaging, delivery coordination). A medication sync plan was created to allow patient to get all medications delivered once every 30 to 90 days per patient preference. Patient understands they have freedom to choose pharmacy and clinical pharmacist will coordinate care between all prescribers and [facility].   Reviewed chart for medication changes. No OVs, Consults, or hospital visits since last care coordination call.  No medication changes indicated.   BP Readings from Last 3 Encounters:  12/07/19 130/68  09/07/19 110/64  08/08/19 111/73    Lab Results  Component Value Date   HGBA1C 6.1 (H) 05/19/2019    Patient requested medications through Vials (with non-safety lids).  Working on Designer, multimedia to transition patient to PACKAGING for  90 Days   Patient returned call and reviewed medications and coordinated delivery.  This delivery to include:  Furosemide 20mg , 1 tablet once daily (12/15/2019)  Nitroglycerin 0.4mg , uses as needed for chest pain (12/15/2019)  Coordinated delivery for the following medications:   Carvedilol 3.125mg , 2 tablets in the morning and 1 tablet in the evening (patient has 39 tablets; next fill due: 12/26/2019)  Ferrous sulfate 325mg , 1 tablet once daily (patient has 16 tablets; due: 12/26/2019)  Levothyroxine 69mcg, 1 tablet once daily (patient has 180 tablets; due: 06/14/2020)   Pantoprazole 40mg , 1 tablet once daily (patient has 90 tablets; due: 03/14/2020)  Pramipexole 0.25mg , 1 tablet at bedtime (patient has 150 tablets; due: 05/15/2020)  Sertraline 50mg , 0.5 tablets (25mg ) once daily (patient has 20 tablets; due: 01/01/2020)  Tamsulosin 0.4mg , 1 capsule once daily (patient has 80 pills; due: 03/01/2020)  Requested refills for: Angelena Form  furosemide 20mg   nitroglycerin 0.4mg    Santiago Glad Black  ferrous sulfate 325mg     Harrell Gave Winter  tamsulosin 0.4mg     Clopidogrel- advised to stop taking. Low dose ASA- continue.   Discussed ferrous sulfate Discussed sertraline   Anson Crofts, PharmD Clinical Pharmacist [facility] [phone]

## 2019-12-14 ENCOUNTER — Telehealth: Payer: Self-pay | Admitting: Cardiovascular Disease

## 2019-12-14 MED ORDER — NITROGLYCERIN 0.4 MG SL SUBL: 0.4000 mg | SUBLINGUAL_TABLET | SUBLINGUAL | 3 refills | Status: DC | PRN

## 2019-12-14 MED ORDER — FUROSEMIDE 20 MG PO TABS: 20.0000 mg | ORAL_TABLET | Freq: Every day | ORAL | 3 refills | Status: DC

## 2019-12-14 NOTE — Telephone Encounter (Signed)
Spoke with [employer]. Confirmed the patient was to STOP PLAVIX 3/29. Reiterated to her he should still be taking ASA.

## 2019-12-14 NOTE — Telephone Encounter (Signed)
Annette with [employer] states she is requesting to confirm that the patient is to continue taking a medication. Please return call to discuss at [phone].

## 2019-12-14 NOTE — Telephone Encounter (Signed)
*  STAT* If patient is at the pharmacy, call can be transferred to refill team.   1. Which medications need to be refilled? (please list name of each medication and dose if known) nitroGLYCERIN (NITROSTAT) 0.4 MG SL tablet furosemide (LASIX) 20 MG tablet  2. Which pharmacy/location (including street and city if local pharmacy) is medication to be sent to? [facility]  3. Do they need a 30 day or 90 day supply? 90 day supply

## 2019-12-15 ENCOUNTER — Other Ambulatory Visit: Payer: Self-pay

## 2019-12-15 MED ORDER — NITROGLYCERIN 0.4 MG SL SUBL: 0.4000 mg | SUBLINGUAL_TABLET | SUBLINGUAL | 3 refills | Status: AC | PRN

## 2019-12-15 MED ORDER — FUROSEMIDE 20 MG PO TABS: 20.0000 mg | ORAL_TABLET | Freq: Every day | ORAL | 3 refills | Status: AC

## 2019-12-25 ENCOUNTER — Encounter (INDEPENDENT_AMBULATORY_CARE_PROVIDER_SITE_OTHER): Payer: Medicare Other | Admitting: Ophthalmology

## 2019-12-25 DIAGNOSIS — H43813 Vitreous degeneration, bilateral: Secondary | ICD-10-CM | POA: Diagnosis not present

## 2019-12-25 DIAGNOSIS — I10 Essential (primary) hypertension: Secondary | ICD-10-CM | POA: Diagnosis not present

## 2019-12-25 DIAGNOSIS — H353231 Exudative age-related macular degeneration, bilateral, with active choroidal neovascularization: Secondary | ICD-10-CM | POA: Diagnosis not present

## 2019-12-25 DIAGNOSIS — H35033 Hypertensive retinopathy, bilateral: Secondary | ICD-10-CM | POA: Diagnosis not present

## 2019-12-27 DIAGNOSIS — I5033 Acute on chronic diastolic (congestive) heart failure: Secondary | ICD-10-CM | POA: Diagnosis not present

## 2020-01-02 NOTE — Chronic Care Management (AMB) (Signed)
Returned patient's call regarding spouse's medications.   Patient also stated [facility] and [facility] decided [facility] since [facility] and would like to avoid confusion. He stated he was able to set up delivery service with them as well.   Patient made aware can reach out for any questions and if situation changes and will gladly help out further with medications.   Anson Crofts, PharmD Clinical Pharmacist [facility] [place] [phone]

## 2020-01-04 ENCOUNTER — Telehealth: Payer: Self-pay | Admitting: Internal Medicine

## 2020-01-04 DIAGNOSIS — C672 Malignant neoplasm of lateral wall of bladder: Secondary | ICD-10-CM | POA: Diagnosis not present

## 2020-01-04 DIAGNOSIS — C61 Malignant neoplasm of prostate: Secondary | ICD-10-CM | POA: Diagnosis not present

## 2020-01-04 DIAGNOSIS — N3941 Urge incontinence: Secondary | ICD-10-CM | POA: Diagnosis not present

## 2020-01-04 NOTE — Telephone Encounter (Signed)
Raymond Swanson with Dr. Delight Hoh, office is requesting an order for home health because Raymond Swanson, had a catheter put in today and will need help at home. Thanks

## 2020-01-04 NOTE — Telephone Encounter (Signed)
We have sent [place] before and he has refused. If he is agreeable: HH PT/OT/aide/SW

## 2020-01-05 NOTE — Telephone Encounter (Signed)
Spoke with patient and he declines HH at this time.

## 2020-01-08 ENCOUNTER — Telehealth: Payer: Self-pay | Admitting: Internal Medicine

## 2020-01-08 NOTE — Telephone Encounter (Signed)
Pt states he just had a gastric bag put on his leg to control urination and it hurts terrible. He thinks something is wrong with it and that the CMA was suppose to be searching info for him. Pt would like a call back and is aware that Jerilee Hoh and CMA do not work Mondays    Pt can be reached at [phone]

## 2020-01-09 NOTE — Telephone Encounter (Signed)
I explained to Mr Chustz that he needs to call his urologist.

## 2020-01-13 ENCOUNTER — Other Ambulatory Visit: Payer: Self-pay | Admitting: Cardiology

## 2020-01-23 ENCOUNTER — Encounter (INDEPENDENT_AMBULATORY_CARE_PROVIDER_SITE_OTHER): Payer: Medicare Other | Admitting: Ophthalmology

## 2020-01-27 DIAGNOSIS — I5033 Acute on chronic diastolic (congestive) heart failure: Secondary | ICD-10-CM | POA: Diagnosis not present

## 2020-01-30 ENCOUNTER — Telehealth: Payer: Self-pay | Admitting: Internal Medicine

## 2020-01-30 NOTE — Telephone Encounter (Signed)
Pt call and want a call back to talk about his wife he wouldn't tell me anything, because he stated that he want talk to me about his wife.

## 2020-01-30 NOTE — Telephone Encounter (Signed)
I called the home number and the pts wife answered.  Mrs Boudoin stated she is not sure what he was calling for as she had problems with her inner ear earlier which she has had for years, took a nap, is feeling better and stated everything is fine.  Message sent to PCP.

## 2020-02-08 DIAGNOSIS — N3941 Urge incontinence: Secondary | ICD-10-CM | POA: Diagnosis not present

## 2020-02-08 DIAGNOSIS — C672 Malignant neoplasm of lateral wall of bladder: Secondary | ICD-10-CM | POA: Diagnosis not present

## 2020-02-12 DIAGNOSIS — C672 Malignant neoplasm of lateral wall of bladder: Secondary | ICD-10-CM | POA: Diagnosis not present

## 2020-02-12 DIAGNOSIS — Z8546 Personal history of malignant neoplasm of prostate: Secondary | ICD-10-CM | POA: Diagnosis not present

## 2020-02-13 DIAGNOSIS — C672 Malignant neoplasm of lateral wall of bladder: Secondary | ICD-10-CM | POA: Diagnosis not present

## 2020-02-13 DIAGNOSIS — Z8546 Personal history of malignant neoplasm of prostate: Secondary | ICD-10-CM | POA: Diagnosis not present

## 2020-02-26 DIAGNOSIS — I5033 Acute on chronic diastolic (congestive) heart failure: Secondary | ICD-10-CM | POA: Diagnosis not present

## 2020-03-04 DIAGNOSIS — Z8546 Personal history of malignant neoplasm of prostate: Secondary | ICD-10-CM | POA: Diagnosis not present

## 2020-03-04 DIAGNOSIS — C672 Malignant neoplasm of lateral wall of bladder: Secondary | ICD-10-CM | POA: Diagnosis not present

## 2020-03-07 DIAGNOSIS — N3941 Urge incontinence: Secondary | ICD-10-CM | POA: Diagnosis not present

## 2020-03-18 ENCOUNTER — Telehealth: Payer: Self-pay | Admitting: Internal Medicine

## 2020-03-18 ENCOUNTER — Emergency Department (HOSPITAL_COMMUNITY)
Admission: EM | Admit: 2020-03-18 | Discharge: 2020-03-18 | Disposition: A | Discharge status: Home / Self Care | Payer: Medicare Other | Attending: Emergency Medicine | Admitting: Emergency Medicine

## 2020-03-18 ENCOUNTER — Emergency Department (HOSPITAL_COMMUNITY): Payer: Medicare Other

## 2020-03-18 ENCOUNTER — Encounter (HOSPITAL_COMMUNITY): Payer: Self-pay | Admitting: Emergency Medicine

## 2020-03-18 ENCOUNTER — Other Ambulatory Visit: Payer: Self-pay

## 2020-03-18 ENCOUNTER — Encounter (INDEPENDENT_AMBULATORY_CARE_PROVIDER_SITE_OTHER): Payer: Medicare Other | Admitting: Ophthalmology

## 2020-03-18 DIAGNOSIS — Y92090 Kitchen in other non-institutional residence as the place of occurrence of the external cause: Secondary | ICD-10-CM | POA: Insufficient documentation

## 2020-03-18 DIAGNOSIS — S2242XD Multiple fractures of ribs, left side, subsequent encounter for fracture with routine healing: Secondary | ICD-10-CM | POA: Diagnosis not present

## 2020-03-18 DIAGNOSIS — D649 Anemia, unspecified: Secondary | ICD-10-CM | POA: Diagnosis not present

## 2020-03-18 DIAGNOSIS — W133XXA Fall through floor, initial encounter: Secondary | ICD-10-CM | POA: Diagnosis not present

## 2020-03-18 DIAGNOSIS — C679 Malignant neoplasm of bladder, unspecified: Secondary | ICD-10-CM | POA: Diagnosis not present

## 2020-03-18 DIAGNOSIS — Z8551 Personal history of malignant neoplasm of bladder: Secondary | ICD-10-CM | POA: Diagnosis not present

## 2020-03-18 DIAGNOSIS — R531 Weakness: Secondary | ICD-10-CM | POA: Diagnosis not present

## 2020-03-18 DIAGNOSIS — E039 Hypothyroidism, unspecified: Secondary | ICD-10-CM | POA: Insufficient documentation

## 2020-03-18 DIAGNOSIS — J984 Other disorders of lung: Secondary | ICD-10-CM | POA: Diagnosis not present

## 2020-03-18 DIAGNOSIS — I700 Atherosclerosis of aorta: Secondary | ICD-10-CM | POA: Diagnosis not present

## 2020-03-18 DIAGNOSIS — I110 Hypertensive heart disease with heart failure: Secondary | ICD-10-CM | POA: Insufficient documentation

## 2020-03-18 DIAGNOSIS — I5033 Acute on chronic diastolic (congestive) heart failure: Secondary | ICD-10-CM | POA: Insufficient documentation

## 2020-03-18 DIAGNOSIS — E785 Hyperlipidemia, unspecified: Secondary | ICD-10-CM | POA: Diagnosis not present

## 2020-03-18 DIAGNOSIS — Z85828 Personal history of other malignant neoplasm of skin: Secondary | ICD-10-CM | POA: Diagnosis not present

## 2020-03-18 DIAGNOSIS — Z8546 Personal history of malignant neoplasm of prostate: Secondary | ICD-10-CM | POA: Insufficient documentation

## 2020-03-18 DIAGNOSIS — Z7982 Long term (current) use of aspirin: Secondary | ICD-10-CM | POA: Diagnosis not present

## 2020-03-18 DIAGNOSIS — S0990XA Unspecified injury of head, initial encounter: Secondary | ICD-10-CM | POA: Insufficient documentation

## 2020-03-18 DIAGNOSIS — Z951 Presence of aortocoronary bypass graft: Secondary | ICD-10-CM | POA: Diagnosis not present

## 2020-03-18 DIAGNOSIS — S42191A Fracture of other part of scapula, right shoulder, initial encounter for closed fracture: Secondary | ICD-10-CM

## 2020-03-18 DIAGNOSIS — I6782 Cerebral ischemia: Secondary | ICD-10-CM | POA: Diagnosis not present

## 2020-03-18 DIAGNOSIS — S42001A Fracture of unspecified part of right clavicle, initial encounter for closed fracture: Secondary | ICD-10-CM | POA: Diagnosis not present

## 2020-03-18 DIAGNOSIS — C61 Malignant neoplasm of prostate: Secondary | ICD-10-CM | POA: Diagnosis not present

## 2020-03-18 DIAGNOSIS — I2510 Atherosclerotic heart disease of native coronary artery without angina pectoris: Secondary | ICD-10-CM | POA: Diagnosis not present

## 2020-03-18 DIAGNOSIS — I10 Essential (primary) hypertension: Secondary | ICD-10-CM

## 2020-03-18 DIAGNOSIS — Y999 Unspecified external cause status: Secondary | ICD-10-CM | POA: Diagnosis not present

## 2020-03-18 DIAGNOSIS — S42101A Fracture of unspecified part of scapula, right shoulder, initial encounter for closed fracture: Secondary | ICD-10-CM | POA: Insufficient documentation

## 2020-03-18 DIAGNOSIS — R296 Repeated falls: Secondary | ICD-10-CM | POA: Insufficient documentation

## 2020-03-18 DIAGNOSIS — Y9389 Activity, other specified: Secondary | ICD-10-CM | POA: Diagnosis not present

## 2020-03-18 DIAGNOSIS — I517 Cardiomegaly: Secondary | ICD-10-CM | POA: Diagnosis not present

## 2020-03-18 DIAGNOSIS — J439 Emphysema, unspecified: Secondary | ICD-10-CM | POA: Diagnosis not present

## 2020-03-18 DIAGNOSIS — W19XXXA Unspecified fall, initial encounter: Secondary | ICD-10-CM | POA: Diagnosis not present

## 2020-03-18 DIAGNOSIS — S4991XA Unspecified injury of right shoulder and upper arm, initial encounter: Secondary | ICD-10-CM | POA: Diagnosis present

## 2020-03-18 DIAGNOSIS — J90 Pleural effusion, not elsewhere classified: Secondary | ICD-10-CM | POA: Diagnosis not present

## 2020-03-18 DIAGNOSIS — Z87891 Personal history of nicotine dependence: Secondary | ICD-10-CM | POA: Diagnosis not present

## 2020-03-18 DIAGNOSIS — Z79899 Other long term (current) drug therapy: Secondary | ICD-10-CM | POA: Insufficient documentation

## 2020-03-18 DIAGNOSIS — J9811 Atelectasis: Secondary | ICD-10-CM | POA: Diagnosis not present

## 2020-03-18 DIAGNOSIS — Z9181 History of falling: Secondary | ICD-10-CM

## 2020-03-18 LAB — CBC WITH DIFFERENTIAL/PLATELET
Abs Immature Granulocytes: 0.12 10*3/uL — ABNORMAL HIGH (ref 0.00–0.07)
Basophils Absolute: 0.1 10*3/uL (ref 0.0–0.1)
Basophils Relative: 0 %
Eosinophils Absolute: 0.4 10*3/uL (ref 0.0–0.5)
Eosinophils Relative: 3 %
HCT: 27.2 % — ABNORMAL LOW (ref 39.0–52.0)
Hemoglobin: 7.7 g/dL — ABNORMAL LOW (ref 13.0–17.0)
Immature Granulocytes: 1 %
Lymphocytes Relative: 5 %
Lymphs Abs: 0.6 10*3/uL — ABNORMAL LOW (ref 0.7–4.0)
MCH: 25.2 pg — ABNORMAL LOW (ref 26.0–34.0)
MCHC: 28.3 g/dL — ABNORMAL LOW (ref 30.0–36.0)
MCV: 89.2 fL (ref 80.0–100.0)
Monocytes Absolute: 0.7 10*3/uL (ref 0.1–1.0)
Monocytes Relative: 6 %
Neutro Abs: 10.6 10*3/uL — ABNORMAL HIGH (ref 1.7–7.7)
Neutrophils Relative %: 85 %
Platelets: 316 10*3/uL (ref 150–400)
RBC: 3.05 MIL/uL — ABNORMAL LOW (ref 4.22–5.81)
RDW: 16.9 % — ABNORMAL HIGH (ref 11.5–15.5)
WBC: 12.4 10*3/uL — ABNORMAL HIGH (ref 4.0–10.5)
nRBC: 0.2 % (ref 0.0–0.2)

## 2020-03-18 LAB — BASIC METABOLIC PANEL
Anion gap: 11 (ref 5–15)
BUN: 32 mg/dL — ABNORMAL HIGH (ref 8–23)
CO2: 28 mmol/L (ref 22–32)
Calcium: 9.6 mg/dL (ref 8.9–10.3)
Chloride: 105 mmol/L (ref 98–111)
Creatinine, Ser: 1.18 mg/dL (ref 0.61–1.24)
GFR calc Af Amer: >60 mL/min (ref >60)
GFR calc non Af Amer: 53 mL/min — ABNORMAL LOW (ref >60)
Glucose, Bld: 117 mg/dL — ABNORMAL HIGH (ref 70–99)
Potassium: 4.6 mmol/L (ref 3.5–5.1)
Sodium: 144 mmol/L (ref 135–145)

## 2020-03-18 LAB — URINALYSIS, ROUTINE W REFLEX MICROSCOPIC
Bilirubin Urine: NEGATIVE
Glucose, UA: NEGATIVE mg/dL
Ketones, ur: NEGATIVE mg/dL
Nitrite: NEGATIVE
Protein, ur: NEGATIVE mg/dL
RBC / HPF: >50 RBC/hpf — ABNORMAL HIGH (ref 0–5)
Specific Gravity, Urine: 1.009 (ref 1.005–1.030)
WBC, UA: >50 WBC/hpf — ABNORMAL HIGH (ref 0–5)
pH: 7 (ref 5.0–8.0)

## 2020-03-18 LAB — D-DIMER, QUANTITATIVE: D-Dimer, Quant: 6.23 ug/mL-FEU — ABNORMAL HIGH (ref 0.00–0.50)

## 2020-03-18 MED ORDER — SODIUM CHLORIDE (PF) 0.9 % IJ SOLN
INTRAMUSCULAR | Status: AC
  Filled 2020-03-18: qty 50

## 2020-03-18 MED ORDER — IOHEXOL 350 MG/ML SOLN
100.0000 mL | Freq: Once | INTRAVENOUS | Status: AC | PRN
  Administered 2020-03-18: 100 mL via INTRAVENOUS

## 2020-03-18 NOTE — ED Provider Notes (Signed)
[facility] DEPT Provider Note   CSN: [id number] Arrival date & time: 03/18/20  1324     History Chief Complaint  Patient presents with  . Fall    Raymond Swanson is a 86 y.o. male.  With a PMHx of hearing loss, HTN, HLD, CAD, recurrent falls for the past 2 years, and history of subdermal hematoma for a fall, presents to the ED from home via EMS for recent increase in falls. Patient notes he uses a walker at home, despite this he has an increase in falls. He mentions he has been to the hospital multiple times for falls and that they have stated it is due to his old age. He also states he has hit his heads multiple times "but nothing serious." He denies any pain at time of my examination. Denies any shortness of breath.    My HPI was limited due to patients severe hearing loss. Will call patient's spouse for further information.  3:28 PM Talked with patient's wife on the phone, she states that this morning the patient was attempting to sit on a bar stool in the kitchen and she states he fell onto the floor. She denies any loss of consciousness and is unsure if the patient hit his head.   The history is provided by the patient, the EMS personnel and the spouse. The history is limited by the condition of the patient (Severe Hearing Impairment).  Fall This is a recurrent problem. The problem occurs every several days. The problem has been gradually worsening.       Past Medical History:  Diagnosis Date  . Acute gastric ulcer with hemorrhage, without mention of obstruction 09/10/2009  . ANEMIA 10/08/2009  . CORONARY ARTERY DISEASE 01/26/2007   s/p remote CABG x3V  . Dyspnea   . History of kidney stones   . HOH (hard of hearing)   . Hx of bladder cancer    s/p TURBT by Dr. Lovena Neighbours  . HYPERLIPIDEMIA 01/26/2007  . HYPERTENSION 01/26/2007  . HYPOTHYROIDISM 10/18/2007  . PROSTATE CANCER, HX OF 01/26/2007  . SKIN CANCER, HX OF 01/26/2007  . Subdural hematoma ([place])  2016   fall from deck while cleaning gutters; sustained subdemral hematoma ; doing ok now     Patient Active Problem List   Diagnosis Date Noted  . Iron deficiency anemia   . Acute on chronic diastolic heart failure ([place]) 05/24/2019  . History of transcatheter aortic valve replacement (TAVR) 05/23/2019  . NSVT (nonsustained ventricular tachycardia) ([place]) 02/09/2019  . Severe aortic stenosis 11/17/2017  . Malignant neoplasm of posterior wall of urinary bladder ([place])   . Restless leg syndrome, controlled 04/27/2017  . T8 vertebral fracture ([place]) 01/27/2016  . L1 vertebral fracture ([place]) 01/27/2016  . Lumbar transverse process fracture ([place]) 01/27/2016  . Pressure ulcer 01/26/2016  . Symptomatic anemia 10/08/2009  . ACUT GASTR ULCER W/HEMORR W/O MENTION OBST 09/10/2009  . Hypothyroidism 10/18/2007  . Dyslipidemia 01/26/2007  . Essential hypertension 01/26/2007  . Hx of CABG 01/26/2007  . PROSTATE CANCER, HX OF 01/26/2007    Past Surgical History:  Procedure Laterality Date  . CATARACT EXTRACTION    . CORONARY ARTERY BYPASS GRAFT     1991; reports at PAT appt 10-11-17: i went to my priamry doctor for a physical and had aroutine stress test that was bad, was sent for cath ( more than 1 but nsure how many or if stents were placed), he rprots " that didnt work so they  did the surgery". denies heart symptoms for over 20 years;  sees  cardiology [facility] annually for EKGs   . CYSTOSCOPY W/ URETERAL STENT PLACEMENT Right 10/13/2017   Procedure: CYSTOSCOPY WITH STENT REPLACEMENT;  Surgeon: Ceasar Mons, MD;  Location: [facility];  Service: Urology;  Laterality: Right;  . EXCISION MASS UPPER EXTREMETIES Left 08/11/2016   Procedure: EXCISION MASS LEFT SHOULDER, LEFT FOREARM, LEFT WRIST;  Surgeon: Judeth Horn, MD;  Location: [facility];  Service: General;  Laterality: Left;  . HERNIA REPAIR     ingunial  . MOHS SURGERY    . PROSTATE SURGERY     prostatectomy  . RIGHT/LEFT HEART CATH AND  CORONARY/GRAFT ANGIOGRAPHY N/A 04/28/2019   Procedure: RIGHT/LEFT HEART CATH AND CORONARY/GRAFT ANGIOGRAPHY;  Surgeon: Sherren Mocha, MD;  Location: [facility];  Service: Cardiovascular;  Laterality: N/A;  . TEE WITHOUT CARDIOVERSION N/A 11/17/2017   Procedure: TRANSESOPHAGEAL ECHOCARDIOGRAM (TEE);  Surgeon: Dixie Dials, MD;  Location: [facility]COPY;  Service: Cardiovascular;  Laterality: N/A;  . TEE WITHOUT CARDIOVERSION N/A 05/23/2019   Procedure: TRANSESOPHAGEAL ECHOCARDIOGRAM (TEE);  Surgeon: Sherren Mocha, MD;  Location: [facility];  Service: Open Heart Surgery;  Laterality: N/A;  . TRANSCATHETER AORTIC VALVE REPLACEMENT, TRANSFEMORAL N/A 05/23/2019   Procedure: TRANSCATHETER AORTIC VALVE REPLACEMENT, TRANSFEMORAL;  Surgeon: Sherren Mocha, MD;  Location: [facility];  Service: Open Heart Surgery;  Laterality: N/A;  . TRANSURETHRAL RESECTION OF BLADDER TUMOR N/A 10/13/2017   Procedure: TRANSURETHRAL RESECTION OF BLADDER TUMOR/ BIPOLAR (TURBT);  Surgeon: Ceasar Mons, MD;  Location: [facility];  Service: Urology;  Laterality: N/A;  ONLY NEEDS 90 MIN FOR BOTH PROCEDURES       Family History  Problem Relation Age of Onset  . Lung cancer Brother 32  . GI problems Sister     Social History   Tobacco Use  . Smoking status: Former Smoker    Packs/day: 1.00    Years: 20.00    Pack years: 20.00    Types: Cigarettes    Quit date: 10/28/1948    Years since quitting: 71.4  . Smokeless tobacco: Never Used  Vaping Use  . Vaping Use: Never used  Substance Use Topics  . Alcohol use: Not Currently    Comment: 4 oz. wine daily  . Drug use: No    Home Medications Prior to Admission medications   Medication Sig Start Date End Date Taking? Authorizing Provider  acetaminophen (TYLENOL) 500 MG tablet Take 500-1,000 mg by mouth every 6 (six) hours as needed for moderate pain or headache.    [provider]  aspirin EC 81 MG tablet Take 81 mg by mouth at  bedtime.     [provider]  CALCIUM PO Take 1 capsule by mouth daily.    [provider]  carboxymethylcellulose (REFRESH TEARS) 0.5 % SOLN Place 1 drop into both eyes 3 (three) times daily as needed (dry eyes).    [provider]  carvedilol (COREG) 3.125 MG tablet TAKE 2 TABLETS IN THE MORNING AND 1 TABLET EVERY EVENING 01/19/20   Eileen Stanford, PA-C  Cyanocobalamin (B-12 PO) Take 1 capsule by mouth daily.    [provider]  ferrous sulfate 325 (65 FE) MG tablet Take 1 tablet (325 mg total) by mouth daily with breakfast. 08/09/19   Domenic Polite, MD  furosemide (LASIX) 20 MG tablet Take 1 tablet (20 mg total) by mouth daily. 12/15/19 12/09/20  Minus Breeding, MD  levothyroxine (SYNTHROID) 75 MCG  tablet TAKE 1 TABLET(75 MCG) BY MOUTH DAILY 10/18/19   Isaac Bliss, Rayford Halsted, MD  MELATONIN PO Take 1 tablet by mouth at bedtime.     [provider]  Multiple Vitamin (MULTIVITAMIN WITH MINERALS) TABS tablet Take 1 tablet by mouth daily.    [provider]  Multiple Vitamins-Minerals (PRESERVISION AREDS 2 PO) Take 1 tablet by mouth 2 (two) times daily.     [provider]  nitroGLYCERIN (NITROSTAT) 0.4 MG SL tablet Place 1 tablet (0.4 mg total) under the tongue every 5 (five) minutes as needed for chest pain. 12/15/19   Minus Breeding, MD  pantoprazole (PROTONIX) 40 MG tablet Take 1 tablet (40 mg total) by mouth daily. 12/12/19   Isaac Bliss, Rayford Halsted, MD  pramipexole (MIRAPEX) 0.25 MG tablet TAKE 1 TABLET AT BEDTIME 12/01/19   Isaac Bliss, Rayford Halsted, MD  sertraline (ZOLOFT) 50 MG tablet Take 0.5 tablets (25 mg total) by mouth daily. 09/07/19   Isaac Bliss, Rayford Halsted, MD  tamsulosin (FLOMAX) 0.4 MG CAPS capsule Take 0.4 mg by mouth daily.    [provider]  VITAMIN D PO Take 1 capsule by mouth daily.    [provider]    Allergies    Patient has no known allergies.  Review of Systems   Review of  Systems  Unable to perform ROS: Other (Severe Hearing Loss Despite Hearing Aids)  Neurological:       Increased Falls    Physical Exam Updated Vital Signs BP 126/72 (BP Location: Right Arm)   Pulse 88   Temp 98.3 F (36.8 C) (Oral)   Resp 20   SpO2 97%   Physical Exam Constitutional:      Comments: Frail, thin  HENT:     Head: Atraumatic.     Mouth/Throat:     Mouth: Mucous membranes are dry.  Cardiovascular:     Rate and Rhythm: Normal rate and regular rhythm.     Pulses: Normal pulses.     Heart sounds: Normal heart sounds.  Pulmonary:     Breath sounds: Normal breath sounds. No stridor. No wheezing, rhonchi or rales.  Abdominal:     General: Abdomen is flat. There is no distension.     Palpations: Abdomen is soft.     Tenderness: There is no abdominal tenderness.  Musculoskeletal:     Right lower leg: No edema.     Left lower leg: No edema.  Skin:    General: Skin is dry.  Neurological:     General: No focal deficit present.     Mental Status: He is alert and oriented to person, place, and time.     ED Results / Procedures / Treatments   Labs (all labs ordered are listed, but only abnormal results are displayed) Labs Reviewed  CBC WITH DIFFERENTIAL/PLATELET - Abnormal; Notable for the following components:      Result Value   WBC 12.4 (*)    RBC 3.05 (*)    Hemoglobin 7.7 (*)    HCT 27.2 (*)    MCH 25.2 (*)    MCHC 28.3 (*)    RDW 16.9 (*)    Neutro Abs 10.6 (*)    Lymphs Abs 0.6 (*)    Abs Immature Granulocytes 0.12 (*)    All other components within normal limits  BASIC METABOLIC PANEL - Abnormal; Notable for the following components:   Glucose, Bld 117 (*)    BUN 32 (*)    GFR calc  non Af Amer 53 (*)    All other components within normal limits  URINALYSIS, ROUTINE W REFLEX MICROSCOPIC - Abnormal; Notable for the following components:   APPearance CLOUDY (*)    Hgb urine dipstick SMALL (*)    Leukocytes,Ua LARGE (*)    RBC / HPF >50 (*)     WBC, UA >50 (*)    Bacteria, UA FEW (*)    All other components within normal limits  D-DIMER, QUANTITATIVE (NOT AT [facility]) - Abnormal; Notable for the following components:   D-Dimer, Quant 6.23 (*)    All other components within normal limits  URINE CULTURE    EKG EKG Interpretation  Date/Time:  Monday March 18 2020 14:46:38 EDT Ventricular Rate:  82 PR Interval:    QRS Duration: 95 QT Interval:  380 QTC Calculation: 444 R Axis:   83 Text Interpretation: Sinus rhythm Borderline short PR interval Borderline right axis deviation Minimal ST depression, anterolateral leads No significant change since last tracing Confirmed by Blanchie Dessert [phone]) on 03/18/2020 2:51:02 PM   Radiology DG Chest 1 View  Result Date: 03/18/2020 CLINICAL DATA:  Multiple falls.  Hypertension. EXAM: CHEST  1 VIEW COMPARISON:  05/16/2019 FINDINGS: Numerous leads and wires project over the chest. Patient rotated left. Moderate cardiomegaly. Prior median sternotomy. Ascending aortic stent graft. No pleural effusion or pneumothorax. Chronic pulmonary interstitial thickening. Mild atelectasis at both lung bases, similar. Hyperinflation. IMPRESSION: No significant change since 05/16/2019 Hyperinflation consistent with COPD/chronic bronchitis. Bibasilar airspace disease, likely atelectasis. Aortic Atherosclerosis (ICD10-I70.0). Electronically Signed   By: Abigail Miyamoto M.D.   On: 03/18/2020 16:56   CT Head Wo Contrast  Result Date: 03/18/2020 CLINICAL DATA:  Ataxia EXAM: CT HEAD WITHOUT CONTRAST TECHNIQUE: Contiguous axial images were obtained from the base of the skull through the vertex without intravenous contrast. COMPARISON:  08/06/2019 head CT. FINDINGS: Brain: Sequela of remote right frontal insult. Confluent hypodense foci involving the periventricular and deep white matter are unchanged. These are nonspecific however commonly associated with chronic microvascular ischemic changes. No new focal hypodensity to  suggest acute infarct. No intracranial hemorrhage. Diffuse parenchymal volume loss with ex vacuo dilatation. No midline shift or mass lesion. No extra-axial fluid collection. Vascular: No hyperdense vessel. Bilateral carotid siphon and V4 segment atherosclerotic calcifications. Skull: Negative for fracture or focal lesion. Sinuses/Orbits: Normal orbits. Clear paranasal sinuses. No mastoid effusion. Other: None. IMPRESSION: No acute intracranial process. Remote right frontal insult. Global cerebral atrophy. Advanced chronic microvascular ischemic changes. Electronically Signed   By: Primitivo Gauze M.D.   On: 03/18/2020 15:49   CT Angio Chest PE W and/or Wo Contrast  Result Date: 03/18/2020 CLINICAL DATA:  86 year old male with chest pain and shortness of breath. EXAM: CT ANGIOGRAPHY CHEST WITH CONTRAST TECHNIQUE: Multidetector CT imaging of the chest was performed using the standard protocol during bolus administration of intravenous contrast. Multiplanar CT image reconstructions and MIPs were obtained to evaluate the vascular anatomy. CONTRAST:  148mL OMNIPAQUE IOHEXOL 350 MG/ML SOLN COMPARISON:  Chest radiograph dated 03/18/2020 and chest CT dated 05/10/2019. FINDINGS: Evaluation of this exam is limited due to respiratory motion artifact. Cardiovascular: Top-normal cardiac size. No pericardial effusion. There is coronary vascular calcification and postsurgical changes of TAVR. There is advanced atherosclerotic calcification of the thoracic aorta. Evaluation of the aorta is however limited due to suboptimal opacification and timing of the contrast. No pulmonary artery embolus identified. Mediastinum/Nodes: No hilar or mediastinal adenopathy. The esophagus is grossly unremarkable. No mediastinal fluid collection. Lungs/Pleura: Background  of moderate to severe emphysema. There are diffuse interstitial coarsening. Trace bilateral pleural effusions noted. No consolidative changes. There is no pneumothorax. The  central airways are patent. Upper Abdomen: There is colonic diverticulosis. Partially visualized mild bilateral hydronephrosis. Musculoskeletal: There is loss of subcutaneous fat and cachexia. Osteopenia with advanced degenerative changes of the spine. Multiple old compression fractures involving T8, T11, superior endplate of R91, and L1. Approximately 4 mm retropulsion of the T11 posterior cortex similar to prior CT. Median sternotomy wires. Multiple old healed left posterior rib fractures. There is fracture of the inferior aspect of the right scapula which is new since the prior CT. Review of the MIP images confirms the above findings. IMPRESSION: 1. No CT evidence of pulmonary artery embolus. 2. Emphysema with trace bilateral pleural effusions. No focal consolidation or pneumothorax. 3. Fracture of the inferior aspect of the right scapula, new since the prior CT. 4. Multiple old compression fractures of the thoracic and lumbar spine. 5. Partially visualized mild bilateral hydronephrosis. 6. Colonic diverticulosis. 7. Aortic Atherosclerosis (ICD10-I70.0) and Emphysema (ICD10-J43.9). Electronically Signed   By: Anner Crete M.D.   On: 03/18/2020 17:47    Procedures Procedures (including critical care time)  Medications Ordered in ED Medications  sodium chloride (PF) 0.9 % injection (has no administration in time range)  iohexol (OMNIPAQUE) 350 MG/ML injection 100 mL (100 mLs Intravenous Contrast Given 03/18/20 1720)    ED Course  I have reviewed the triage vital signs and the nursing notes.  Pertinent labs & imaging results that were available during my care of the patient were reviewed by me and considered in my medical decision making (see chart for details).  6:48 PM Attempted to call patients wife to get further information however line was busy. Will try again in a bit.   6:48 PM Talked with patient's wife who updated me that patient fell from barstool today and fell to the ground. She  states he did not lose consciousness but is unsure if he hit his head.   6:48 PM D Dimer of 6.23. Geneva score of 4, moderate risk group for PE 20-30%. Will order CTA of the chest.   6:48 PM Reading patient's prior notes he appears to have a history of anemia due to iron deficiency anemia from GI ulcers and refused ED in 07/2019. He received 2 units of blood when his hemoglobin was 6.6. Also read patient has home oxygen, but only uses it when he is short of breath.   6:58 PM Talked with patients wife multiple times about patient's status and his need for follow up with his primary care doctor for his low blood counts and scapular fracture.   Clinical Course as of Mar 19 1847  Mon Mar 18, 2020  1349 SpO2(!): 86 % [VK]    Clinical Course User Index [VK] Riesa Pope, MD   MDM Rules/Calculators/A&P                          Noboru Bidinger is a 86 y/o M who presents to the ED after a fall from a barstool this morning witnessed by his wife. She states he did not lose consciousness but is unsure if he hit his head. He is severely hard of hearing, however, he states is in no current pain. Denies shortness of breath. HPI/Physical exam limited due to patients hearing impairment.   Patient was initially hypoxic on room air, but improved to 100% on 2L nasal cannula.  Patient states he has oxygen at home and uses it when he exerts himself.  Physical exam was unremarkable overall, regular rate/rhythm, lungs were CTA. Physical exam was overall unremarkable.   Of significance on laboratory data, WBC of 12.4, Hgb of 7.7, MCV of 89.2. Urine with large leukocytes, however, few bacteria. Will culture and if patient's culture is positive, patient will be called and updated. Patient has had anemia in the past attributed to his chronic GI bleeding on plavix. Patient is asymptomatic.  Chest X-ray revealed no significant changes since 04/2019. Head CT w/o contrast revealed no acute hemorrhage or  infarction.   D Dimer of 6.23, CT Angio Chest ordered. Resulted in no acute pulmonary embolism, but patient does have a new fracture of the inferior aspect of the right scapula when compared to CT angio chest from 04/2019. Patient is not tender to palpate over the area of his right scapula and is denying any pain. No aortic or mediastinal injury apparent on patient's x-ray. No pneumothorax seen on x-ray. Suspect patient's scapular fracture is from an old fall as patient states he falls all the time. No other apparent injury to suspect damage to the patient's lungs, aorta, mediastinum, or liver on CTA of Chest.  Patient is not symptomatic and believe that he lost his balance while trying to sit on a stool and fell. Low suspicion for patient's fall being caused by cardiopulmonary causes such as arrhythmia, pulmonary embolism, myocardial infarction, CVA/TIA, or dissection. Low suspicion for injury from his fall today, CT head without contrast negative for any acute hemorrhage.   Will recommend patient follow up with primary care provider as well as orthopedics for his newly found scapular fracture. Wrote patient for a splint that he can wear if he has any pain of his right arm/shoulder. Patient was able to ambulate with heavy assistance, he uses a walker at home to ambulate. Also discussed with patient for him to talk with his primary care provider if he feels he is unable to take care of himself and his wife at home. Patient's care assistant to drive him home. Discussed this with the patient and he was able to repeat back to me the plan.   Final Clinical Impression(s) / ED Diagnoses Final diagnoses:  Fall, initial encounter  Closed fracture of other part of right scapula, initial encounter  Anemia, unspecified type    Rx / DC Orders ED Discharge Orders    None       Riesa Pope, MD 03/18/20 [place]    Blanchie Dessert, MD 03/19/20 1544

## 2020-03-18 NOTE — Telephone Encounter (Signed)
Tish the pts caregiver is called the office stating that the pt had falling today and it was not her day to go and take the pt to an appointment she had went by to just check on him.  Pt was taken to the hospital to get medical assistance for a cut that he had gotten during his fall.  They are wondering if they can get a home health referral to assist them with their daily needs.  Per [employer] with [employer] [place] stated that a referral is needed from the pts PCP for home health assistance.

## 2020-03-18 NOTE — ED Notes (Addendum)
Ambulated pt in room. While ambulating, pt had labored breathing and required 2-person assist. Once pt was back in the bed, O2 sats was 88% while on RA.

## 2020-03-18 NOTE — Discharge Instructions (Addendum)
Please call your primary care office tomorrow for a follow up concerning your anemia (low hemoglobin levels). Also please talk with your primary care doctor about needing more help at home.  Please also call Dr. Alvan Dame from [facility] for your scapular fracture. Please wear the shoulder splint if you have any shoulder or arm pain.   Please wear your home oxygen and if you feel short of breath or have any new symptoms  please return to the closest ED or call 911.

## 2020-03-18 NOTE — ED Notes (Signed)
Tish (caregiver) would like to be called to take him back home. [phone]

## 2020-03-18 NOTE — ED Triage Notes (Signed)
Per EMS pt from home with multiple falls; wife wants him evaluated. Pt baseline per wife.  2 inch skin tear on left arm and dime sized skin tear on left shoulder.  VS with EMS  BP 137/65 CBG 143 RR 22 HR 82 O2 not obtained

## 2020-03-19 ENCOUNTER — Telehealth: Payer: Self-pay | Admitting: Internal Medicine

## 2020-03-19 NOTE — Telephone Encounter (Signed)
I am ok with HH everything: PT/OT/aide/SW

## 2020-03-19 NOTE — Telephone Encounter (Signed)
Raymond Swanson the primary care called and said [name] fell last night and went to ER and they said to follow up with his PCP, she wants to know if she should make an appt sooner before the 8/11   She came in and spoke with [place] yesterday.  Please call her at [phone]  Please advice

## 2020-03-19 NOTE — Telephone Encounter (Signed)
Raymond Swanson is aware

## 2020-03-19 NOTE — Telephone Encounter (Signed)
8/11 ok unless he needs to see me sooner

## 2020-03-21 LAB — URINE CULTURE: Culture: >=100000 — AB

## 2020-03-22 ENCOUNTER — Telehealth: Payer: Self-pay | Admitting: Emergency Medicine

## 2020-03-22 ENCOUNTER — Telehealth: Payer: Self-pay | Admitting: Internal Medicine

## 2020-03-22 NOTE — Progress Notes (Deleted)
ED Antimicrobial Stewardship Positive Culture Follow Up   Raymond Swanson is an 86 y.o. male who presented to [facility] on 08/06/2019 with a chief complaint of  Chief Complaint  Patient presents with  . Fall  . Head Laceration    Recent Results (from the past 720 hour(s))  Urine culture     Status: Abnormal   Collection Time: 03/18/20  4:52 PM   Specimen: Urine, Random  Result Value Ref Range Status   Specimen Description   Final    URINE, RANDOM Performed at [facility] [address] [place], [place] [address]    Special Requests   Final    NONE Performed at [facility], [place] [address], [place], [place] [address]    Culture >=100,000 COLONIES/mL ENTEROBACTER CLOACAE (A)  Final   Report Status 03/21/2020 FINAL  Final   Organism ID, Bacteria ENTEROBACTER CLOACAE (A)  Final      Susceptibility   Enterobacter cloacae - MIC*    CEFAZOLIN >=64 RESISTANT Resistant     CIPROFLOXACIN <=0.25 SENSITIVE Sensitive     GENTAMICIN <=1 SENSITIVE Sensitive     IMIPENEM 0.5 SENSITIVE Sensitive     NITROFURANTOIN 64 INTERMEDIATE Intermediate     TRIMETH/SULFA <=20 SENSITIVE Sensitive     PIP/TAZO <=4 SENSITIVE Sensitive     * >=100,000 COLONIES/mL ENTEROBACTER CLOACAE     [x]  Patient discharged originally without antimicrobial agent and treatment may be indicated  Action needed: check if pt has symptoms of UTI, if symptoms are present initiate treatment New antibiotic prescription: Bactrim DS tk 1 tablet BID for 7 days, 0 refills  ED Provider: Arlean Hopping PA-C   [name] 03/22/2020, 10:17 AM Clinical Pharmacist [phone]

## 2020-03-22 NOTE — Telephone Encounter (Signed)
Post ED Visit - Positive Culture Follow-up: Successful Patient Follow-Up  Culture assessed and recommendations reviewed by:  []  Elenor Quinones, Pharm.D. []  Heide Guile, Pharm.D., BCPS AQ-ID []  Parks Neptune, Pharm.D., BCPS []  Alycia Rossetti, Pharm.D., BCPS []  Dougherty, Pharm.D., BCPS, AAHIVP []  Legrand Como, Pharm.D., BCPS, AAHIVP []  Salome Arnt, PharmD, BCPS []  Johnnette Gourd, PharmD, BCPS []  Hughes Better, PharmD, BCPS []  Leeroy Cha, PharmD Karel Jarvis PharmD  Positive urine culture  []  Patient discharged without antimicrobial prescription and treatment is now indicated []  Organism is resistant to prescribed ED discharge antimicrobial []  Patient with positive blood cultures  Changes discussed with ED provider: Arlean Hopping PA New antibiotic prescription symptom check,if symptoms of UTI start Bactrim DS 1 tab po bid x 7 days Patient denies any urinary symptoms, no rx called     Hazle Nordmann 03/22/2020, 11:54 AM

## 2020-03-22 NOTE — Progress Notes (Signed)
ED Antimicrobial Stewardship Positive Culture Follow Up   Raymond Swanson is an 86 y.o. male who presented to [facility] on 03/18/2020 with a chief complaint of  Chief Complaint  Patient presents with  . Fall    Recent Results (from the past 720 hour(s))  Urine culture     Status: Abnormal   Collection Time: 03/18/20  4:52 PM   Specimen: Urine, Random  Result Value Ref Range Status   Specimen Description   Final    URINE, RANDOM Performed at [facility] [address] [place], [place] [address]    Special Requests   Final    NONE Performed at [facility], [place] [address] [place], [place] [address]    Culture >=100,000 COLONIES/mL ENTEROBACTER CLOACAE (A)  Final   Report Status 03/21/2020 FINAL  Final   Organism ID, Bacteria ENTEROBACTER CLOACAE (A)  Final      Susceptibility   Enterobacter cloacae - MIC*    CEFAZOLIN >=64 RESISTANT Resistant     CIPROFLOXACIN <=0.25 SENSITIVE Sensitive     GENTAMICIN <=1 SENSITIVE Sensitive     IMIPENEM 0.5 SENSITIVE Sensitive     NITROFURANTOIN 64 INTERMEDIATE Intermediate     TRIMETH/SULFA <=20 SENSITIVE Sensitive     PIP/TAZO <=4 SENSITIVE Sensitive     * >=100,000 COLONIES/mL ENTEROBACTER CLOACAE    [x]  Patient discharged originally without antimicrobial agent and treatment may be indicated if continues symptomatic  Please check with patient, if has symptoms of UTI, then can treat with   New antibiotic prescription: Septra DS one tablet bid x 7 days  ED Provider: Jamey Ripa  PharmD 03/22/2020, 10:29 AM Clinical Pharmacist [phone]

## 2020-03-22 NOTE — Telephone Encounter (Signed)
Referral already placed for Home health.  Deb will also reach out again.

## 2020-03-22 NOTE — Telephone Encounter (Signed)
Raymond Swanson is calling from the Call Center stated that she was calling to do a hospital follow with the pt and he expressed a need to have someone to come out to change his dressings.  Raymond Swanson would like to have a call back.

## 2020-03-24 ENCOUNTER — Encounter (HOSPITAL_COMMUNITY): Payer: Self-pay | Admitting: Emergency Medicine

## 2020-03-24 ENCOUNTER — Inpatient Hospital Stay (HOSPITAL_COMMUNITY)
Admission: EM | Admit: 2020-03-24 | Discharge: 2020-04-24 | DRG: 296 | Disposition: E | Payer: Medicare Other | Attending: Internal Medicine | Admitting: Internal Medicine

## 2020-03-24 ENCOUNTER — Emergency Department (HOSPITAL_COMMUNITY): Payer: Medicare Other

## 2020-03-24 DIAGNOSIS — Z8551 Personal history of malignant neoplasm of bladder: Secondary | ICD-10-CM | POA: Diagnosis not present

## 2020-03-24 DIAGNOSIS — R569 Unspecified convulsions: Secondary | ICD-10-CM | POA: Diagnosis not present

## 2020-03-24 DIAGNOSIS — I959 Hypotension, unspecified: Secondary | ICD-10-CM | POA: Diagnosis not present

## 2020-03-24 DIAGNOSIS — N170 Acute kidney failure with tubular necrosis: Secondary | ICD-10-CM | POA: Diagnosis not present

## 2020-03-24 DIAGNOSIS — N136 Pyonephrosis: Secondary | ICD-10-CM | POA: Diagnosis not present

## 2020-03-24 DIAGNOSIS — D62 Acute posthemorrhagic anemia: Secondary | ICD-10-CM | POA: Diagnosis not present

## 2020-03-24 DIAGNOSIS — Z20822 Contact with and (suspected) exposure to covid-19: Secondary | ICD-10-CM | POA: Diagnosis not present

## 2020-03-24 DIAGNOSIS — Z8719 Personal history of other diseases of the digestive system: Secondary | ICD-10-CM

## 2020-03-24 DIAGNOSIS — I2510 Atherosclerotic heart disease of native coronary artery without angina pectoris: Secondary | ICD-10-CM | POA: Diagnosis present

## 2020-03-24 DIAGNOSIS — J3489 Other specified disorders of nose and nasal sinuses: Secondary | ICD-10-CM | POA: Diagnosis not present

## 2020-03-24 DIAGNOSIS — E039 Hypothyroidism, unspecified: Secondary | ICD-10-CM | POA: Diagnosis present

## 2020-03-24 DIAGNOSIS — Z66 Do not resuscitate: Secondary | ICD-10-CM | POA: Diagnosis not present

## 2020-03-24 DIAGNOSIS — I130 Hypertensive heart and chronic kidney disease with heart failure and stage 1 through stage 4 chronic kidney disease, or unspecified chronic kidney disease: Secondary | ICD-10-CM | POA: Diagnosis present

## 2020-03-24 DIAGNOSIS — Z87442 Personal history of urinary calculi: Secondary | ICD-10-CM | POA: Diagnosis not present

## 2020-03-24 DIAGNOSIS — E785 Hyperlipidemia, unspecified: Secondary | ICD-10-CM | POA: Diagnosis present

## 2020-03-24 DIAGNOSIS — Z8546 Personal history of malignant neoplasm of prostate: Secondary | ICD-10-CM

## 2020-03-24 DIAGNOSIS — E43 Unspecified severe protein-calorie malnutrition: Secondary | ICD-10-CM | POA: Diagnosis present

## 2020-03-24 DIAGNOSIS — Z79899 Other long term (current) drug therapy: Secondary | ICD-10-CM

## 2020-03-24 DIAGNOSIS — N1832 Chronic kidney disease, stage 3b: Secondary | ICD-10-CM | POA: Diagnosis present

## 2020-03-24 DIAGNOSIS — T83511A Infection and inflammatory reaction due to indwelling urethral catheter, initial encounter: Secondary | ICD-10-CM | POA: Diagnosis not present

## 2020-03-24 DIAGNOSIS — Z79890 Hormone replacement therapy (postmenopausal): Secondary | ICD-10-CM

## 2020-03-24 DIAGNOSIS — E872 Acidosis: Secondary | ICD-10-CM | POA: Diagnosis present

## 2020-03-24 DIAGNOSIS — Z681 Body mass index (BMI) 19 or less, adult: Secondary | ICD-10-CM | POA: Diagnosis not present

## 2020-03-24 DIAGNOSIS — J9601 Acute respiratory failure with hypoxia: Secondary | ICD-10-CM | POA: Diagnosis present

## 2020-03-24 DIAGNOSIS — Z951 Presence of aortocoronary bypass graft: Secondary | ICD-10-CM

## 2020-03-24 DIAGNOSIS — R4020 Unspecified coma: Secondary | ICD-10-CM | POA: Diagnosis not present

## 2020-03-24 DIAGNOSIS — R64 Cachexia: Secondary | ICD-10-CM | POA: Diagnosis not present

## 2020-03-24 DIAGNOSIS — I6782 Cerebral ischemia: Secondary | ICD-10-CM | POA: Diagnosis not present

## 2020-03-24 DIAGNOSIS — L409 Psoriasis, unspecified: Secondary | ICD-10-CM | POA: Diagnosis present

## 2020-03-24 DIAGNOSIS — Z7982 Long term (current) use of aspirin: Secondary | ICD-10-CM

## 2020-03-24 DIAGNOSIS — J90 Pleural effusion, not elsewhere classified: Secondary | ICD-10-CM | POA: Diagnosis not present

## 2020-03-24 DIAGNOSIS — R54 Age-related physical debility: Secondary | ICD-10-CM | POA: Diagnosis present

## 2020-03-24 DIAGNOSIS — J439 Emphysema, unspecified: Secondary | ICD-10-CM | POA: Diagnosis not present

## 2020-03-24 DIAGNOSIS — Z952 Presence of prosthetic heart valve: Secondary | ICD-10-CM | POA: Diagnosis not present

## 2020-03-24 DIAGNOSIS — I462 Cardiac arrest due to underlying cardiac condition: Secondary | ICD-10-CM | POA: Diagnosis not present

## 2020-03-24 DIAGNOSIS — I499 Cardiac arrhythmia, unspecified: Secondary | ICD-10-CM | POA: Diagnosis not present

## 2020-03-24 DIAGNOSIS — G9389 Other specified disorders of brain: Secondary | ICD-10-CM | POA: Diagnosis not present

## 2020-03-24 DIAGNOSIS — R0689 Other abnormalities of breathing: Secondary | ICD-10-CM | POA: Diagnosis not present

## 2020-03-24 DIAGNOSIS — G931 Anoxic brain damage, not elsewhere classified: Secondary | ICD-10-CM | POA: Diagnosis not present

## 2020-03-24 DIAGNOSIS — Z8673 Personal history of transient ischemic attack (TIA), and cerebral infarction without residual deficits: Secondary | ICD-10-CM

## 2020-03-24 DIAGNOSIS — B958 Unspecified staphylococcus as the cause of diseases classified elsewhere: Secondary | ICD-10-CM | POA: Diagnosis present

## 2020-03-24 DIAGNOSIS — Z87891 Personal history of nicotine dependence: Secondary | ICD-10-CM

## 2020-03-24 DIAGNOSIS — I469 Cardiac arrest, cause unspecified: Principal | ICD-10-CM | POA: Diagnosis present

## 2020-03-24 DIAGNOSIS — Z7902 Long term (current) use of antithrombotics/antiplatelets: Secondary | ICD-10-CM

## 2020-03-24 DIAGNOSIS — D509 Iron deficiency anemia, unspecified: Secondary | ICD-10-CM | POA: Diagnosis present

## 2020-03-24 DIAGNOSIS — Z85828 Personal history of other malignant neoplasm of skin: Secondary | ICD-10-CM

## 2020-03-24 DIAGNOSIS — I10 Essential (primary) hypertension: Secondary | ICD-10-CM | POA: Diagnosis not present

## 2020-03-24 DIAGNOSIS — R609 Edema, unspecified: Secondary | ICD-10-CM | POA: Diagnosis not present

## 2020-03-24 DIAGNOSIS — Z801 Family history of malignant neoplasm of trachea, bronchus and lung: Secondary | ICD-10-CM

## 2020-03-24 DIAGNOSIS — I5032 Chronic diastolic (congestive) heart failure: Secondary | ICD-10-CM | POA: Diagnosis not present

## 2020-03-24 DIAGNOSIS — I517 Cardiomegaly: Secondary | ICD-10-CM | POA: Diagnosis not present

## 2020-03-24 DIAGNOSIS — J80 Acute respiratory distress syndrome: Secondary | ICD-10-CM

## 2020-03-24 DIAGNOSIS — R404 Transient alteration of awareness: Secondary | ICD-10-CM | POA: Diagnosis not present

## 2020-03-24 DIAGNOSIS — D649 Anemia, unspecified: Secondary | ICD-10-CM

## 2020-03-24 DIAGNOSIS — I639 Cerebral infarction, unspecified: Secondary | ICD-10-CM | POA: Diagnosis not present

## 2020-03-24 DIAGNOSIS — R4182 Altered mental status, unspecified: Secondary | ICD-10-CM | POA: Diagnosis not present

## 2020-03-24 LAB — CBC WITH DIFFERENTIAL/PLATELET
Abs Immature Granulocytes: 0.16 10*3/uL — ABNORMAL HIGH (ref 0.00–0.07)
Basophils Absolute: 0 10*3/uL (ref 0.0–0.1)
Basophils Relative: 0 %
Eosinophils Absolute: 0.2 10*3/uL (ref 0.0–0.5)
Eosinophils Relative: 3 %
HCT: 24.1 % — ABNORMAL LOW (ref 39.0–52.0)
Hemoglobin: 6.4 g/dL — CL (ref 13.0–17.0)
Immature Granulocytes: 2 %
Lymphocytes Relative: 8 %
Lymphs Abs: 0.6 10*3/uL — ABNORMAL LOW (ref 0.7–4.0)
MCH: 24.2 pg — ABNORMAL LOW (ref 26.0–34.0)
MCHC: 26.6 g/dL — ABNORMAL LOW (ref 30.0–36.0)
MCV: 91.3 fL (ref 80.0–100.0)
Monocytes Absolute: 0.6 10*3/uL (ref 0.1–1.0)
Monocytes Relative: 8 %
Neutro Abs: 5.7 10*3/uL (ref 1.7–7.7)
Neutrophils Relative %: 79 %
Platelets: 207 10*3/uL (ref 150–400)
RBC: 2.64 MIL/uL — ABNORMAL LOW (ref 4.22–5.81)
RDW: 17.4 % — ABNORMAL HIGH (ref 11.5–15.5)
WBC: 7.2 10*3/uL (ref 4.0–10.5)
nRBC: 1 % — ABNORMAL HIGH (ref 0.0–0.2)

## 2020-03-24 LAB — I-STAT ARTERIAL BLOOD GAS, ED
Acid-Base Excess: 1 mmol/L (ref 0.0–2.0)
Bicarbonate: 26.8 mmol/L (ref 20.0–28.0)
Calcium, Ion: 1.12 mmol/L — ABNORMAL LOW (ref 1.15–1.40)
HCT: 20 % — ABNORMAL LOW (ref 39.0–52.0)
Hemoglobin: 6.8 g/dL — CL (ref 13.0–17.0)
O2 Saturation: 99 %
Patient temperature: 97.1
Potassium: 4.8 mmol/L (ref 3.5–5.1)
Sodium: 145 mmol/L (ref 135–145)
TCO2: 28 mmol/L (ref 22–32)
pCO2 arterial: 46.2 mmHg (ref 32.0–48.0)
pH, Arterial: 7.368 (ref 7.350–7.450)
pO2, Arterial: 157 mmHg — ABNORMAL HIGH (ref 83.0–108.0)

## 2020-03-24 LAB — URINALYSIS, ROUTINE W REFLEX MICROSCOPIC
Bilirubin Urine: NEGATIVE
Glucose, UA: 50 mg/dL — AB
Ketones, ur: NEGATIVE mg/dL
Nitrite: POSITIVE — AB
Protein, ur: >=300 mg/dL — AB
Specific Gravity, Urine: 1.015 (ref 1.005–1.030)
WBC, UA: >50 WBC/hpf — ABNORMAL HIGH (ref 0–5)
pH: 5 (ref 5.0–8.0)

## 2020-03-24 LAB — PREPARE RBC (CROSSMATCH)

## 2020-03-24 LAB — COMPREHENSIVE METABOLIC PANEL
ALT: 125 U/L — ABNORMAL HIGH (ref 0–44)
AST: 274 U/L — ABNORMAL HIGH (ref 15–41)
Albumin: 2.2 g/dL — ABNORMAL LOW (ref 3.5–5.0)
Alkaline Phosphatase: 204 U/L — ABNORMAL HIGH (ref 38–126)
Anion gap: 12 (ref 5–15)
BUN: 32 mg/dL — ABNORMAL HIGH (ref 8–23)
CO2: 22 mmol/L (ref 22–32)
Calcium: 7.9 mg/dL — ABNORMAL LOW (ref 8.9–10.3)
Chloride: 110 mmol/L (ref 98–111)
Creatinine, Ser: 1.44 mg/dL — ABNORMAL HIGH (ref 0.61–1.24)
GFR calc Af Amer: 48 mL/min — ABNORMAL LOW (ref >60)
GFR calc non Af Amer: 42 mL/min — ABNORMAL LOW (ref >60)
Glucose, Bld: 212 mg/dL — ABNORMAL HIGH (ref 70–99)
Potassium: 5 mmol/L (ref 3.5–5.1)
Sodium: 144 mmol/L (ref 135–145)
Total Bilirubin: 0.7 mg/dL (ref 0.3–1.2)
Total Protein: 5.3 g/dL — ABNORMAL LOW (ref 6.5–8.1)

## 2020-03-24 LAB — SARS CORONAVIRUS 2 BY RT PCR (HOSPITAL ORDER, PERFORMED IN ~~LOC~~ HOSPITAL LAB): SARS Coronavirus 2: NEGATIVE

## 2020-03-24 LAB — APTT: aPTT: 29 seconds (ref 24–36)

## 2020-03-24 LAB — PROTIME-INR
INR: 1.3 — ABNORMAL HIGH (ref 0.8–1.2)
Prothrombin Time: 15.5 seconds — ABNORMAL HIGH (ref 11.4–15.2)

## 2020-03-24 LAB — LACTIC ACID, PLASMA: Lactic Acid, Venous: 5.4 mmol/L (ref 0.5–1.9)

## 2020-03-24 MED ORDER — DOCUSATE SODIUM 100 MG PO CAPS: 100.0000 mg | ORAL_CAPSULE | Freq: Two times a day (BID) | ORAL | Status: DC | PRN

## 2020-03-24 MED ORDER — PANTOPRAZOLE SODIUM 40 MG IV SOLR
80.0000 mg | Freq: Once | INTRAVENOUS | Status: AC
  Administered 2020-03-24: 80 mg via INTRAVENOUS
  Filled 2020-03-24: qty 80

## 2020-03-24 MED ORDER — CHLORHEXIDINE GLUCONATE 0.12% ORAL RINSE (MEDLINE KIT)
15.0000 mL | Freq: Two times a day (BID) | OROMUCOSAL | Status: DC
  Administered 2020-03-24 – 2020-03-31 (×14): 15 mL via OROMUCOSAL

## 2020-03-24 MED ORDER — FENTANYL 2500MCG IN NS 250ML (10MCG/ML) PREMIX INFUSION
25.0000 ug/h | INTRAVENOUS | Status: DC
  Administered 2020-03-24: 50 ug/h via INTRAVENOUS
  Filled 2020-03-24: qty 250

## 2020-03-24 MED ORDER — ORAL CARE MOUTH RINSE
15.0000 mL | OROMUCOSAL | Status: DC
  Administered 2020-03-25 – 2020-03-31 (×63): 15 mL via OROMUCOSAL

## 2020-03-24 MED ORDER — SODIUM CHLORIDE 0.9 % IV SOLN
2.0000 g | INTRAVENOUS | Status: DC
  Filled 2020-03-24: qty 2

## 2020-03-24 MED ORDER — MIDAZOLAM HCL 2 MG/2ML IJ SOLN: 1.0000 mg | INTRAMUSCULAR | Status: DC | PRN

## 2020-03-24 MED ORDER — POLYETHYLENE GLYCOL 3350 17 G PO PACK: 17.0000 g | PACK | Freq: Every day | ORAL | Status: DC | PRN

## 2020-03-24 MED ORDER — SODIUM CHLORIDE 0.9 % IV SOLN
2.0000 g | Freq: Once | INTRAVENOUS | Status: AC
  Administered 2020-03-24: 2 g via INTRAVENOUS
  Filled 2020-03-24: qty 2

## 2020-03-24 MED ORDER — PANTOPRAZOLE SODIUM 40 MG IV SOLR
40.0000 mg | Freq: Two times a day (BID) | INTRAVENOUS | Status: DC
  Administered 2020-03-25 – 2020-03-31 (×14): 40 mg via INTRAVENOUS
  Filled 2020-03-24 (×14): qty 40

## 2020-03-24 MED ORDER — LACTATED RINGERS IV SOLN: INTRAVENOUS | Status: DC

## 2020-03-24 MED ORDER — FENTANYL BOLUS VIA INFUSION
25.0000 ug | INTRAVENOUS | Status: DC | PRN
  Administered 2020-03-25: 25 ug via INTRAVENOUS
  Filled 2020-03-24: qty 25

## 2020-03-24 MED ORDER — SODIUM CHLORIDE 0.9 % IV SOLN: 10.0000 mL/h | Freq: Once | INTRAVENOUS | Status: DC

## 2020-03-24 MED ORDER — CHLORHEXIDINE GLUCONATE CLOTH 2 % EX PADS
6.0000 | MEDICATED_PAD | Freq: Every day | CUTANEOUS | Status: DC
  Administered 2020-03-24 – 2020-03-31 (×7): 6 via TOPICAL

## 2020-03-24 MED ORDER — FENTANYL CITRATE (PF) 100 MCG/2ML IJ SOLN
50.0000 ug | Freq: Once | INTRAMUSCULAR | Status: AC
  Administered 2020-03-24: 50 ug via INTRAVENOUS

## 2020-03-24 NOTE — Progress Notes (Signed)
   04/10/2020 1950  Clinical Encounter Type  Visited With Patient;Family;Health care provider  Visit Type Initial;ED;Critical Care  Referral From Nurse   Chaplain responded to a post-CPR in the ED. Chaplain called patient's granddaughter Sharyn Lull to notify her that the patient was in the Emergency Dept. Granddaughter was on her way to be with the patient's spouse, who also has dementia. Patient's son is out of town but attempting to get a flight here. Would appreciate any information when available. Spiritual care services available as needed.    Jeri Lager, Chaplain

## 2020-03-24 NOTE — ED Triage Notes (Signed)
Pt transported from home by [facility], pt's wife called d/t finding pt in the BR floor. Pt found to be in PEA, 12 minutes CPR done with 1 epi given, ROSC achieved enroute. #18 LFA, I/O LLE. Pt given 1500 NS by EMS,  7.5 ETT placed.

## 2020-03-24 NOTE — ED Notes (Signed)
CCM at bedside 

## 2020-03-24 NOTE — Progress Notes (Signed)
RT NOTE:  Pt transported to CT and back without event.  

## 2020-03-24 NOTE — Progress Notes (Signed)
Per bedside RN, pt is receiving blood products, so repeat LA will be delayed.

## 2020-03-24 NOTE — ED Notes (Signed)
Phlebotomy at bedside for [facility] and [facility] draw

## 2020-03-24 NOTE — ED Provider Notes (Signed)
[facility] EMERGENCY DEPARTMENT Provider Note   CSN: [id number] Arrival date & time: 04/07/2020  1926     History Chief Complaint  Patient presents with  . Post CPR    Raymond Swanson is a 86 y.o. male.  HPI     86 year old male with history of CAD, anemia, gastritis, sdh comes in a chief complaint of cardiac arrest. Patient was at his home when he suddenly collapsed in his bathroom.  Wife called EMS.  When they arrived patient was pulseless and CPR was initiated.  It appears that EMS has given about 5 to 7 minutes of ACLS CPR.  Patient received 1 round of epinephrine.  Fire had gotten to the home within 5 minutes and started CPR prior to EMS arriving.  In total patient has received 10 minutes of CPR.  Patient was intubated on scene.  Family reported that the CODE STATUS is full code.  Patient is being paced at arrival.  CBG is over 100.    Past Medical History:  Diagnosis Date  . Acute gastric ulcer with hemorrhage, without mention of obstruction 09/10/2009  . ANEMIA 10/08/2009  . CORONARY ARTERY DISEASE 01/26/2007   s/p remote CABG x3V  . Dyspnea   . History of kidney stones   . HOH (hard of hearing)   . Hx of bladder cancer    s/p TURBT by Dr. Lovena Neighbours  . HYPERLIPIDEMIA 01/26/2007  . HYPERTENSION 01/26/2007  . HYPOTHYROIDISM 10/18/2007  . PROSTATE CANCER, HX OF 01/26/2007  . SKIN CANCER, HX OF 01/26/2007  . Subdural hematoma ([place]) 2016   fall from deck while cleaning gutters; sustained subdemral hematoma ; doing ok now     Patient Active Problem List   Diagnosis Date Noted  . Iron deficiency anemia   . Acute on chronic diastolic heart failure ([place]) 05/24/2019  . History of transcatheter aortic valve replacement (TAVR) 05/23/2019  . NSVT (nonsustained ventricular tachycardia) ([place]) 02/09/2019  . Severe aortic stenosis 11/17/2017  . Malignant neoplasm of posterior wall of urinary bladder ([place])   . Restless leg syndrome, controlled 04/27/2017  . T8  vertebral fracture ([place]) 01/27/2016  . L1 vertebral fracture ([place]) 01/27/2016  . Lumbar transverse process fracture ([place]) 01/27/2016  . Pressure ulcer 01/26/2016  . Symptomatic anemia 10/08/2009  . ACUT GASTR ULCER W/HEMORR W/O MENTION OBST 09/10/2009  . Hypothyroidism 10/18/2007  . Dyslipidemia 01/26/2007  . Essential hypertension 01/26/2007  . Hx of CABG 01/26/2007  . PROSTATE CANCER, HX OF 01/26/2007    Past Surgical History:  Procedure Laterality Date  . CATARACT EXTRACTION    . CORONARY ARTERY BYPASS GRAFT     1991; reports at PAT appt 10-11-17: i went to my priamry doctor for a physical and had aroutine stress test that was bad, was sent for cath ( more than 1 but nsure how many or if stents were placed), he rprots " that didnt work so they did the surgery". denies heart symptoms for over 20 years;  sees  cardiology [facility] annually for EKGs   . CYSTOSCOPY W/ URETERAL STENT PLACEMENT Right 10/13/2017   Procedure: CYSTOSCOPY WITH STENT REPLACEMENT;  Surgeon: Ceasar Mons, MD;  Location: [facility];  Service: Urology;  Laterality: Right;  . EXCISION MASS UPPER EXTREMETIES Left 08/11/2016   Procedure: EXCISION MASS LEFT SHOULDER, LEFT FOREARM, LEFT WRIST;  Surgeon: Judeth Horn, MD;  Location: [facility];  Service: General;  Laterality: Left;  . HERNIA REPAIR     ingunial  . MOHS  SURGERY    . PROSTATE SURGERY     prostatectomy  . RIGHT/LEFT HEART CATH AND CORONARY/GRAFT ANGIOGRAPHY N/A 04/28/2019   Procedure: RIGHT/LEFT HEART CATH AND CORONARY/GRAFT ANGIOGRAPHY;  Surgeon: Sherren Mocha, MD;  Location: [facility];  Service: Cardiovascular;  Laterality: N/A;  . TEE WITHOUT CARDIOVERSION N/A 11/17/2017   Procedure: TRANSESOPHAGEAL ECHOCARDIOGRAM (TEE);  Surgeon: Dixie Dials, MD;  Location: [facility];  Service: Cardiovascular;  Laterality: N/A;  . TEE WITHOUT CARDIOVERSION N/A 05/23/2019   Procedure: TRANSESOPHAGEAL ECHOCARDIOGRAM (TEE);  Surgeon: Sherren Mocha, MD;   Location: [facility];  Service: Open Heart Surgery;  Laterality: N/A;  . TRANSCATHETER AORTIC VALVE REPLACEMENT, TRANSFEMORAL N/A 05/23/2019   Procedure: TRANSCATHETER AORTIC VALVE REPLACEMENT, TRANSFEMORAL;  Surgeon: Sherren Mocha, MD;  Location: [facility];  Service: Open Heart Surgery;  Laterality: N/A;  . TRANSURETHRAL RESECTION OF BLADDER TUMOR N/A 10/13/2017   Procedure: TRANSURETHRAL RESECTION OF BLADDER TUMOR/ BIPOLAR (TURBT);  Surgeon: Ceasar Mons, MD;  Location: [facility];  Service: Urology;  Laterality: N/A;  ONLY NEEDS 90 MIN FOR BOTH PROCEDURES       Family History  Problem Relation Age of Onset  . Lung cancer Brother 16  . GI problems Sister     Social History   Tobacco Use  . Smoking status: Former Smoker    Packs/day: 1.00    Years: 20.00    Pack years: 20.00    Types: Cigarettes    Quit date: 10/28/1948    Years since quitting: 71.4  . Smokeless tobacco: Never Used  Vaping Use  . Vaping Use: Never used  Substance Use Topics  . Alcohol use: Not Currently    Comment: 4 oz. wine daily  . Drug use: No    Home Medications Prior to Admission medications   Medication Sig Start Date End Date Taking? Authorizing Provider  acetaminophen (TYLENOL) 500 MG tablet Take 500-1,000 mg by mouth every 6 (six) hours as needed for moderate pain or headache.    [provider]  aspirin EC 81 MG tablet Take 81 mg by mouth at bedtime.     [provider]  BESIVANCE 0.6 % SUSP Apply to eye. 12/26/19   [provider]  CALCIUM PO Take 1 capsule by mouth daily.    [provider]  carboxymethylcellulose (REFRESH TEARS) 0.5 % SOLN Place 1 drop into both eyes 3 (three) times daily as needed (dry eyes).    [provider]  carvedilol (COREG) 3.125 MG tablet TAKE 2 TABLETS IN THE MORNING AND 1 TABLET EVERY EVENING 01/19/20   Eileen Stanford, PA-C  clopidogrel (PLAVIX) 75 MG tablet Take 75 mg by mouth every morning.  01/25/20   [provider]  Cyanocobalamin (B-12 PO) Take 1 capsule by mouth daily.    [provider]  ferrous sulfate 325 (65 FE) MG tablet Take 1 tablet (325 mg total) by mouth daily with breakfast. 08/09/19   Domenic Polite, MD  furosemide (LASIX) 20 MG tablet Take 1 tablet (20 mg total) by mouth daily. 12/15/19 12/09/20  Minus Breeding, MD  levothyroxine (SYNTHROID) 75 MCG tablet TAKE 1 TABLET(75 MCG) BY MOUTH DAILY 10/18/19   Isaac Bliss, Rayford Halsted, MD  MELATONIN PO Take 1 tablet by mouth at bedtime.     [provider]  Multiple Vitamin (MULTIVITAMIN WITH MINERALS) TABS tablet Take 1 tablet by mouth daily.    [provider]  Multiple Vitamins-Minerals (PRESERVISION AREDS 2 PO) Take 1 tablet by  mouth 2 (two) times daily.     [provider]  MYRBETRIQ 25 MG TB24 tablet Take 25 mg by mouth daily. 02/09/20   [provider]  nitroGLYCERIN (NITROSTAT) 0.4 MG SL tablet Place 1 tablet (0.4 mg total) under the tongue every 5 (five) minutes as needed for chest pain. 12/15/19   Minus Breeding, MD  pantoprazole (PROTONIX) 40 MG tablet Take 1 tablet (40 mg total) by mouth daily. 12/12/19   Isaac Bliss, Rayford Halsted, MD  pramipexole (MIRAPEX) 0.25 MG tablet TAKE 1 TABLET AT BEDTIME 12/01/19   Isaac Bliss, Rayford Halsted, MD  sertraline (ZOLOFT) 50 MG tablet Take 0.5 tablets (25 mg total) by mouth daily. 09/07/19   Isaac Bliss, Rayford Halsted, MD  tamsulosin (FLOMAX) 0.4 MG CAPS capsule Take 0.4 mg by mouth daily.    [provider]  VITAMIN D PO Take 1 capsule by mouth daily.    [provider]    Allergies    Patient has no known allergies.  Review of Systems   Review of Systems  Unable to perform ROS: Intubated    Physical Exam Updated Vital Signs BP (!) 119/61   Pulse 71   Temp (!) 97.1 F (36.2 C) (Tympanic)   Resp 17   Ht 5\' 8"  (1.727 m)   Wt 54 kg   SpO2 100%   BMI 18.10 kg/m   Physical Exam Vitals and nursing  note reviewed.  Constitutional:      Appearance: He is well-developed.  HENT:     Head: Atraumatic.  Cardiovascular:     Rate and Rhythm: Normal rate.  Pulmonary:     Comments: On vent Musculoskeletal:     Cervical back: Neck supple.  Skin:    General: Skin is warm.     Findings: Bruising, lesion and rash present.  Neurological:     Mental Status: He is oriented to person, place, and time.     ED Results / Procedures / Treatments   Labs (all labs ordered are listed, but only abnormal results are displayed) Labs Reviewed  LACTIC ACID, PLASMA - Abnormal; Notable for the following components:      Result Value   Lactic Acid, Venous 5.4 (*)    All other components within normal limits  COMPREHENSIVE METABOLIC PANEL - Abnormal; Notable for the following components:   Glucose, Bld 212 (*)    BUN 32 (*)    Creatinine, Ser 1.44 (*)    Calcium 7.9 (*)    Total Protein 5.3 (*)    Albumin 2.2 (*)    AST 274 (*)    ALT 125 (*)    Alkaline Phosphatase 204 (*)    GFR calc non Af Amer 42 (*)    GFR calc Af Amer 48 (*)    All other components within normal limits  CBC WITH DIFFERENTIAL/PLATELET - Abnormal; Notable for the following components:   RBC 2.64 (*)    Hemoglobin 6.4 (*)    HCT 24.1 (*)    MCH 24.2 (*)    MCHC 26.6 (*)    RDW 17.4 (*)    nRBC 1.0 (*)    Lymphs Abs 0.6 (*)    Abs Immature Granulocytes 0.16 (*)    All other components within normal limits  PROTIME-INR - Abnormal; Notable for the following components:   Prothrombin Time 15.5 (*)    INR 1.3 (*)    All other components within normal limits  I-STAT ARTERIAL BLOOD GAS, ED - Abnormal;  Notable for the following components:   pO2, Arterial 157 (*)    Calcium, Ion 1.12 (*)    HCT 20.0 (*)    Hemoglobin 6.8 (*)    All other components within normal limits  SARS CORONAVIRUS 2 BY RT PCR (HOSPITAL ORDER, [facility])  CULTURE, BLOOD (ROUTINE X 2)  CULTURE, BLOOD (ROUTINE X 2)  URINE  CULTURE  APTT  LACTIC ACID, PLASMA  URINALYSIS, ROUTINE W REFLEX MICROSCOPIC  VITAMIN B12  FOLATE  IRON AND TIBC  FERRITIN  RETICULOCYTES  TYPE AND SCREEN  PREPARE RBC (CROSSMATCH)  TROPONIN I (HIGH SENSITIVITY)    EKG EKG Interpretation  Date/Time:  Sunday March 24 2020 19:32:55 EDT Ventricular Rate:  84 PR Interval:    QRS Duration: 147 QT Interval:  432 QTC Calculation: 511 R Axis:   113 Text Interpretation: Sinus rhythm Short PR interval RBBB and LPFB bundle branch block is new Confirmed by Varney Biles [phone]) on 04/15/2020 9:32:47 PM   Radiology CT Head Wo Contrast  Result Date: 04/22/2020 CLINICAL DATA:  Altered mental status, post CPR, found on floor bathroom EXAM: CT HEAD WITHOUT CONTRAST TECHNIQUE: Contiguous axial images were obtained from the base of the skull through the vertex without intravenous contrast. COMPARISON:  CT 03/18/2020 FINDINGS: Brain: Stable areas of encephalomalacia in the right frontal lobe and anterior right temporal pole. Additional stable hypoattenuating foci in the bilateral basal ganglia likely reflecting sequela of remote lacunes. No new CT evident areas of acute infarction. No evidence of acute infarction, hemorrhage, hydrocephalus, extra-axial collection or mass lesion/mass effect. Symmetric prominence of the ventricles, cisterns and sulci compatible with parenchymal volume loss. Patchy areas of white matter hypoattenuation are most compatible with chronic microvascular angiopathy. Vascular: Atherosclerotic calcification of the carotid siphons and intradural vertebral arteries. No hyperdense vessel. Skull: No calvarial fracture or suspicious osseous lesion. No scalp swelling or hematoma. Sinuses/Orbits: Paranasal sinuses and mastoid air cells are predominantly clear. Orbital structures are unremarkable aside from prior lens extractions. Other: Endotracheal and transesophageal tubes are in place best visualized on scout imaging. Dental bridge noted  as well. IMPRESSION: 1. No acute intracranial abnormality. 2. Stable areas of encephalomalacia in the right frontal lobe and anterior right temporal pole as well as sequela of prior lacunar infarcts in the bilateral basal ganglia. 3. Stable parenchymal volume loss and chronic microvascular angiopathy. 4. Endotracheal and transesophageal tubes in place. Electronically Signed   By: Lovena Le M.D.   On: 04/16/2020 21:25   DG Chest Port 1 View  Result Date: 04/16/2020 CLINICAL DATA:  Post CPR EXAM: PORTABLE CHEST 1 VIEW COMPARISON:  March 18, 2020 FINDINGS: The heart size and mediastinal contours are unchanged with mild cardiomegaly. Aortic knob calcifications are seen. Prosthetic aortic valve is seen. Overlying median sternotomy wires are present. ET tube is seen 2 cm above the carina. NG tube is seen coursing below the diaphragm. There are mildly increased interstitial markings seen throughout both lungs, more prominent than the prior exam. There is a probable small right pleural effusion. Emphysematous changes are seen at both lung apices. IMPRESSION: Mild diffuse increased interstitial markings throughout both lungs which could be due to interstitial edema and/or infectious etiology. Trace right pleural effusion ETT and NG tube in satisfactory position Electronically Signed   By: Prudencio Pair M.D.   On: 04/07/2020 20:01    Procedures .Critical Care Performed by: Varney Biles, MD Authorized by: Varney Biles, MD   Critical care provider statement:  Critical care time (minutes):  48   Critical care was necessary to treat or prevent imminent or life-threatening deterioration of the following conditions:  Circulatory failure and respiratory failure   Critical care was time spent personally by me on the following activities:  Discussions with consultants, evaluation of patient's response to treatment, examination of patient, ordering and performing treatments and interventions, ordering and review  of laboratory studies, ordering and review of radiographic studies, pulse oximetry, re-evaluation of patient's condition, obtaining history from patient or surrogate and review of old charts     Cardiopulmonary Resuscitation (CPR) Procedure Note Directed/Performed by: Samual Beals I personally directed ancillary staff and/or performed CPR in an effort to regain return of spontaneous circulation and to maintain cardiac, neuro and systemic perfusion.   (including critical care time)  Medications Ordered in ED Medications  lactated ringers infusion ( Intravenous New Bag/Given 04/21/2020 2019)  fentaNYL 2547mcg in NS 268mL (70mcg/ml) infusion-PREMIX (50 mcg/hr Intravenous New Bag/Given 04/06/2020 1940)  fentaNYL (SUBLIMAZE) bolus via infusion 25 mcg (has no administration in time range)  midazolam (VERSED) injection 1 mg (has no administration in time range)  midazolam (VERSED) injection 1 mg (has no administration in time range)  0.9 %  sodium chloride infusion (has no administration in time range)  ceFEPIme (MAXIPIME) 2 g in sodium chloride 0.9 % 100 mL IVPB (has no administration in time range)  pantoprazole (PROTONIX) injection 80 mg (has no administration in time range)  ceFEPIme (MAXIPIME) 2 g in sodium chloride 0.9 % 100 mL IVPB (0 g Intravenous Stopped 04/08/2020 2117)  fentaNYL (SUBLIMAZE) injection 50 mcg (50 mcg Intravenous Given 04/05/2020 1940)    ED Course  I have reviewed the triage vital signs and the nursing notes.  Pertinent labs & imaging results that were available during my care of the patient were reviewed by me and considered in my medical decision making (see chart for details).  Clinical Course as of Mar 24 2142  Nancy Fetter Mar 24, 2020  2143 GI has been consulted.  2 units of PRBC ordered.  [facility] is on call.  Hemoglobin(!!): 6.4 [AN]  2143 Full code confirmed. Son, who makes decisions, will be in town tomorrow.   [AN]    Clinical Course User Index [AN] Varney Biles, MD    MDM Rules/Calculators/A&P                         86 year old male comes in a chief complaint of cardiac arrest.  Differential diagnosis includes ACS, stroke, brain bleed, sepsis, severe anemia, electrolyte abnormalities.  Patient has an indwelling Foley catheter.  We will give him a dose of cefepime.  Discussed case with patient's wife and also granddaughter.  The wife reports that patient was doing well until he passed out in the bathroom.  She went immediately to seek help.  There is some conflicting reports on downtime prior to fire getting better.  Wife reports that via catheter immediately, EMS thinks that patient might have been down longer.  2 large-bore IVs established in the ED.  Patient has good hemodynamics.  Pacing was discontinued and there was no complications.  Appropriate labs sent.  Final Clinical Impression(s) / ED Diagnoses Final diagnoses:  Cardiac arrest ([place])  Symptomatic anemia    Rx / DC Orders ED Discharge Orders    None       Varney Biles, MD 04/02/2020 2143

## 2020-03-24 NOTE — H&P (Addendum)
NAME:  Raymond Swanson, MRN:  [id number], DOB:  03-Jan-1925, LOS: 0 ADMISSION DATE:  04/23/2020, CONSULTATION DATE:  04/09/2020 REFERRING MD:  EDP, CHIEF COMPLAINT:  Cardiac Arrest   Brief History   86 year old male with past medical history significant for CAD, TAVR GI bleed who lives at home and was reportedly in his usual state of health when he had a witnessed collapse.  On EMS arrival patient was in PEA arrest, unclear downtime prior to this.  10 minutes ACLS and epi x1 with ROSC.  Patient intubated and PCCM consulted for admission.  History of present illness   Raymond Swanson is a 86 year old male with past medical history of CAD status post CABG x3, TAVR, HFpEF, prostate and bladder cancer who lives at home and reportedly had a collapsed in the bathroom.  Wife has dementia and so history somewhat unclear.  Patient had somewhere around 3 to 5 minutes of downtime prior to EMS arrival, he was found in PEA arrest.  ACLS performed for 10 minutes with epi x1 before ROSC.  He was intubated and transported to the ED.  In the ED, patient was hemodynamically stable and CT head was negative.  EKG without signs of STEMI.  Labs showed no severe metabolic derangements, lactic acid 5.2, anemic with hemoglobin of 6.4.  Chest x-ray with likely mild edema.  Urinalysis positive for UTI.  He was given 2 units PRBCs, has a history of GI bleed in the past so GI was consulted and he was started on Protonix.  PCCM consulted for admission  Past Medical History   has a past medical history of Acute gastric ulcer with hemorrhage, without mention of obstruction (09/10/2009), ANEMIA (10/08/2009), CORONARY ARTERY DISEASE (01/26/2007), Dyspnea, History of kidney stones, HOH (hard of hearing), bladder cancer, HYPERLIPIDEMIA (01/26/2007), HYPERTENSION (01/26/2007), HYPOTHYROIDISM (10/18/2007), PROSTATE CANCER, HX OF (01/26/2007), SKIN CANCER, HX OF (01/26/2007), and Subdural hematoma ([place]) (2016).   Significant Hospital Events   8/1 Admit to  PCCM  Consults:    Procedures:  8/1 ETT  Significant Diagnostic Tests:  8/1 CT head>>No acute intracranial abnormality.  Stable areas of encephalomalacia in the right frontal lobe and anterior right temporal pole as well as sequela of prior lacunar infarcts in the bilateral basal ganglia.  8/1 CXR>>Mild diffuse increased interstitial markings throughout both lungs which could be due to interstitial edema and/or infectious etiology.  Micro Data:  8/1 MRSA screen>> 8/1 urine culture>> 8/1 blood cultures x2>> 8/1 Covid-19>> negative  Antimicrobials:  Cefepime 8/1-  Interim history/subjective:  Patient intubated and requiring mild sedation, hemodynamically stable  Objective   Blood pressure (!) 113/53, pulse 67, temperature (!) 96.5 F (35.8 C), temperature source Tympanic, resp. rate 16, height 5\' 8"  (1.727 m), weight 54 kg, SpO2 100 %.    Vent Mode: PRVC FiO2 (%):  [50 %-100 %] 50 % Set Rate:  [20 bmp] 20 bmp Vt Set:  [550 mL] 550 mL PEEP:  [5 cmH20] 5 cmH20 Plateau Pressure:  [16 cmH20] 16 cmH20   Intake/Output Summary (Last 24 hours) at 04/16/2020 2238 Last data filed at 04/20/2020 2004 Gross per 24 hour  Intake 1500 ml  Output --  Net 1500 ml   Filed Weights   04/20/2020 2015  Weight: 54 kg   General: Elderly male, thin, multiple skin tears, unresponsive  HEENT: MM pink/moist, ETT in place  Neuro: Withdrawing to pain in all extremities, + gag reflex pupils 2 mm equal and responsive to light, breathing over the vent CV: s1s2  RRR, no m/r/g PULM: Expiratory wheezing bilaterally GI: soft, bsx4 active  Extremities: warm/dry, 2+ edema Skin: no rashes or lesions  Resolved Hospital Problem list     Assessment & Plan:   Witnessed out of hospital PEA arrest with acute respiratory failure With at least 3-5 minutes downtime before EMS arrival, 10 minutes ACLS with epi x1 because is currently unclear, CT head is negative, no major metabolic disturbances.  He lives with  wife who has dementia, so history is not reliable -EKG not suggestive of acute ischemia, troponin is pending -Consider PE, though not hypoxic or tachycardic, check lower extremity Dopplers -Echocardiogram -Target 37 degrees, normothermia protocol -EEG -Maintain full vent support with SAT/SBT as tolerated -titrate Vent setting to maintain SpO2 greater than or equal to 90%. -HOB elevated 30 degrees. -Plateau pressures less than 30 cm H20.  -Follow chest x-ray, ABG prn.   -Bronchial hygiene and RT/bronchodilator protocol. -Spoke with patient's Annie Main who is POA he states patient is not DNR but had clearly stated he did not want to live life support.  He will be in town to further discuss goals of care   UTI with indwelling Foley -Urine and blood cultures pending, continue cefepime -Has lactic acidosis, but no fever or leukocytosis and suspect this is secondary to cardiac arrest rather than sepsis  Acute on chronic anemia As a history of iron deficiency anemia as well as probable upper GI bleed (had refused GI work-up in the past) on aspirin and Plavix -Hemoglobin 6.4 baseline appears to be between 7 and 8 -Given 2 units PRBCs in the ED and started on Protonix, no reported history of recent bleeding -Monitor H&H and hold antiplatelets   History of CAD s/p CABG x3, NSVT, severe AS s/p TAVR 04/2019, HFpEF  Echo last year with an EF of 48-18% grade 1 diastolic dysfunction -Echo, trend troponin -Holding home aspirin and Plavix in the setting of anemia, hold beta-blocker postarrest, likely resume if blood pressure stable -Lasix 40mg  overnight and hold further IVF  CKD stage III -Stable  History of prostate cancer bladder cancer s/p TURBT -Change Foley, hold Myrbetriq  Hypothyroidism -Check TSH, continue Synthroid  Best practice:  Diet: N.p.o. Pain/Anxiety/Delirium protocol (if indicated): Fentanyl gtt. VAP protocol (if indicated): HOB 30 degrees, suction as needed DVT prophylaxis:  SCDs GI prophylaxis: PPI Glucose control: SSI Mobility: Bedrest Code Status: Full code Family Communication: Patient son updated, granddaughter and wife updated by the ED Disposition: ICU  Labs   CBC: Recent Labs  Lab 03/18/20 1508 04/16/2020 1948 04/03/2020 1957  WBC 12.4* 7.2  --   NEUTROABS 10.6* 5.7  --   HGB 7.7* 6.4* 6.8*  HCT 27.2* 24.1* 20.0*  MCV 89.2 91.3  --   PLT 316 207  --     Basic Metabolic Panel: Recent Labs  Lab 03/18/20 1508 04/10/2020 1948 04/02/2020 1957  NA 144 144 145  K 4.6 5.0 4.8  CL 105 110  --   CO2 28 22  --   GLUCOSE 117* 212*  --   BUN 32* 32*  --   CREATININE 1.18 1.44*  --   CALCIUM 9.6 7.9*  --    GFR: Estimated Creatinine Clearance: 24.5 mL/min (A) (by C-G formula based on SCr of 1.44 mg/dL (H)). Recent Labs  Lab 03/18/20 1508 04/08/2020 1948 04/18/2020 1950  WBC 12.4* 7.2  --   LATICACIDVEN  --   --  5.4*    Liver Function Tests: Recent Labs  Lab 04/18/2020 1948  AST 274*  ALT 125*  ALKPHOS 204*  BILITOT 0.7  PROT 5.3*  ALBUMIN 2.2*   No results for input(s): LIPASE, AMYLASE in the last 168 hours. No results for input(s): AMMONIA in the last 168 hours.  ABG    Component Value Date/Time   PHART 7.368 04/03/2020 1957   PCO2ART 46.2 03/30/2020 1957   PO2ART 157 (H) 04/05/2020 1957   HCO3 26.8 04/09/2020 1957   TCO2 28 04/23/2020 1957   O2SAT 99.0 04/20/2020 1957     Coagulation Profile: Recent Labs  Lab 04/08/2020 1948  INR 1.3*    Cardiac Enzymes: No results for input(s): CKTOTAL, CKMB, CKMBINDEX, TROPONINI in the last 168 hours.  HbA1C: Hgb A1c MFr Bld  Date/Time Value Ref Range Status  05/19/2019 09:13 AM 6.1 (H) 4.8 - 5.6 % Final    Comment:    (NOTE) Pre diabetes:          5.7%-6.4% Diabetes:              >6.4% Glycemic control for   <7.0% adults with diabetes     CBG: No results for input(s): GLUCAP in the last 168 hours.  Review of Systems:   Unable to obtain secondary to mental status  Past  Medical History  He,  has a past medical history of Acute gastric ulcer with hemorrhage, without mention of obstruction (09/10/2009), ANEMIA (10/08/2009), CORONARY ARTERY DISEASE (01/26/2007), Dyspnea, History of kidney stones, HOH (hard of hearing), bladder cancer, HYPERLIPIDEMIA (01/26/2007), HYPERTENSION (01/26/2007), HYPOTHYROIDISM (10/18/2007), PROSTATE CANCER, HX OF (01/26/2007), SKIN CANCER, HX OF (01/26/2007), and Subdural hematoma ([place]) (2016).   Surgical History    Past Surgical History:  Procedure Laterality Date  . CATARACT EXTRACTION    . CORONARY ARTERY BYPASS GRAFT     1991; reports at PAT appt 10-11-17: i went to my priamry doctor for a physical and had aroutine stress test that was bad, was sent for cath ( more than 1 but nsure how many or if stents were placed), he rprots " that didnt work so they did the surgery". denies heart symptoms for over 20 years;  sees  cardiology [facility] annually for EKGs   . CYSTOSCOPY W/ URETERAL STENT PLACEMENT Right 10/13/2017   Procedure: CYSTOSCOPY WITH STENT REPLACEMENT;  Surgeon: Ceasar Mons, MD;  Location: [facility];  Service: Urology;  Laterality: Right;  . EXCISION MASS UPPER EXTREMETIES Left 08/11/2016   Procedure: EXCISION MASS LEFT SHOULDER, LEFT FOREARM, LEFT WRIST;  Surgeon: Judeth Horn, MD;  Location: [facility];  Service: General;  Laterality: Left;  . HERNIA REPAIR     ingunial  . MOHS SURGERY    . PROSTATE SURGERY     prostatectomy  . RIGHT/LEFT HEART CATH AND CORONARY/GRAFT ANGIOGRAPHY N/A 04/28/2019   Procedure: RIGHT/LEFT HEART CATH AND CORONARY/GRAFT ANGIOGRAPHY;  Surgeon: Sherren Mocha, MD;  Location: [facility];  Service: Cardiovascular;  Laterality: N/A;  . TEE WITHOUT CARDIOVERSION N/A 11/17/2017   Procedure: TRANSESOPHAGEAL ECHOCARDIOGRAM (TEE);  Surgeon: Dixie Dials, MD;  Location: [facility];  Service: Cardiovascular;  Laterality: N/A;  . TEE WITHOUT CARDIOVERSION N/A 05/23/2019   Procedure: TRANSESOPHAGEAL  ECHOCARDIOGRAM (TEE);  Surgeon: Sherren Mocha, MD;  Location: [facility];  Service: Open Heart Surgery;  Laterality: N/A;  . TRANSCATHETER AORTIC VALVE REPLACEMENT, TRANSFEMORAL N/A 05/23/2019   Procedure: TRANSCATHETER AORTIC VALVE REPLACEMENT, TRANSFEMORAL;  Surgeon: Sherren Mocha, MD;  Location: [facility];  Service: Open Heart Surgery;  Laterality: N/A;  . TRANSURETHRAL RESECTION OF  BLADDER TUMOR N/A 10/13/2017   Procedure: TRANSURETHRAL RESECTION OF BLADDER TUMOR/ BIPOLAR (TURBT);  Surgeon: Ceasar Mons, MD;  Location: [facility];  Service: Urology;  Laterality: N/A;  ONLY NEEDS 90 MIN FOR BOTH PROCEDURES     Social History   reports that he quit smoking about 71 years ago. His smoking use included cigarettes. He has a 20.00 pack-year smoking history. He has never used smokeless tobacco. He reports previous alcohol use. He reports that he does not use drugs.   Family History   His family history includes GI problems in his sister; Lung cancer (age of onset: 78) in his brother.   Allergies No Known Allergies   Home Medications  Prior to Admission medications   Medication Sig Start Date End Date Taking? Authorizing Provider  acetaminophen (TYLENOL) 500 MG tablet Take 500-1,000 mg by mouth every 6 (six) hours as needed for moderate pain or headache.    [provider]  aspirin EC 81 MG tablet Take 81 mg by mouth at bedtime.     [provider]  BESIVANCE 0.6 % SUSP Apply to eye. 12/26/19   [provider]  CALCIUM PO Take 1 capsule by mouth daily.    [provider]  carboxymethylcellulose (REFRESH TEARS) 0.5 % SOLN Place 1 drop into both eyes 3 (three) times daily as needed (dry eyes).    [provider]  carvedilol (COREG) 3.125 MG tablet TAKE 2 TABLETS IN THE MORNING AND 1 TABLET EVERY EVENING 01/19/20   Eileen Stanford, PA-C  clopidogrel (PLAVIX) 75 MG tablet Take 75 mg by mouth every morning. 01/25/20   [provider]  Cyanocobalamin (B-12 PO) Take 1 capsule by mouth daily.    [provider]  ferrous sulfate 325 (65 FE) MG tablet Take 1 tablet (325 mg total) by mouth daily with breakfast. 08/09/19   Domenic Polite, MD  furosemide (LASIX) 20 MG tablet Take 1 tablet (20 mg total) by mouth daily. 12/15/19 12/09/20  Minus Breeding, MD  levothyroxine (SYNTHROID) 75 MCG tablet TAKE 1 TABLET(75 MCG) BY MOUTH DAILY 10/18/19   Isaac Bliss, Rayford Halsted, MD  MELATONIN PO Take 1 tablet by mouth at bedtime.     [provider]  Multiple Vitamin (MULTIVITAMIN WITH MINERALS) TABS tablet Take 1 tablet by mouth daily.    [provider]  Multiple Vitamins-Minerals (PRESERVISION AREDS 2 PO) Take 1 tablet by mouth 2 (two) times daily.     [provider]  MYRBETRIQ 25 MG TB24 tablet Take 25 mg by mouth daily. 02/09/20   [provider]  nitroGLYCERIN (NITROSTAT) 0.4 MG SL tablet Place 1 tablet (0.4 mg total) under the tongue every 5 (five) minutes as needed for chest pain. 12/15/19   Minus Breeding, MD  pantoprazole (PROTONIX) 40 MG tablet Take 1 tablet (40 mg total) by mouth daily. 12/12/19   Isaac Bliss, Rayford Halsted, MD  pramipexole (MIRAPEX) 0.25 MG tablet TAKE 1 TABLET AT BEDTIME 12/01/19   Isaac Bliss, Rayford Halsted, MD  sertraline (ZOLOFT) 50 MG tablet Take 0.5 tablets (25 mg total) by mouth daily. 09/07/19   Isaac Bliss, Rayford Halsted, MD  tamsulosin (FLOMAX) 0.4 MG CAPS capsule Take 0.4 mg by mouth daily.    [provider]  VITAMIN D PO Take 1 capsule by mouth daily.    [provider]     Critical care time: 55 minutes     CRITICAL CARE Performed by: Otilio Carpen Jermain Curt   Total critical  care time: 55 minutes  Critical care time was exclusive of separately billable procedures and treating other patients.  Critical care was necessary to treat or prevent imminent or life-threatening deterioration.  Critical care was time spent personally  by me on the following activities: development of treatment plan with patient and/or surrogate as well as nursing, discussions with consultants, evaluation of patient's response to treatment, examination of patient, obtaining history from patient or surrogate, ordering and performing treatments and interventions, ordering and review of laboratory studies, ordering and review of radiographic studies, pulse oximetry and re-evaluation of patient's condition.   Otilio Carpen Vasti Yagi, PA-C

## 2020-03-24 NOTE — ED Notes (Signed)
Report called to 2H 

## 2020-03-24 NOTE — Progress Notes (Signed)
Notified bedside nurse of need to draw repeat lactic acid. 

## 2020-03-24 NOTE — Progress Notes (Signed)
Pharmacy Antibiotic Note  Raymond Swanson is a 86 y.o. male admitted on 04/22/2020 with sepsis.  Pharmacy has been consulted for Cefepime dosing.   Height: 5\' 8"  (172.7 cm) Weight: 54 kg (119 lb 0.8 oz) IBW/kg (Calculated) : 68.4  Temp (24hrs), Avg:97 F (36.1 C), Min:96.9 F (36.1 C), Max:97.1 F (36.2 C)  Recent Labs  Lab 03/18/20 1508 04/20/2020 1948 04/01/2020 1950  WBC 12.4* 7.2  --   CREATININE 1.18 1.44*  --   LATICACIDVEN  --   --  5.4*    Estimated Creatinine Clearance: 24.5 mL/min (A) (by C-G formula based on SCr of 1.44 mg/dL (H)).    No Known Allergies  Antimicrobials this admission: 8/1 Cefepime >>   Dose adjustments this admission: N/a  Microbiology results: Pending   Plan:  - Cefepime 2g IV q24h - Monitor patients renal function and urine output  - De-escalate ABX when appropriate   Thank you for allowing pharmacy to be a part of this patient's care.  Duanne Limerick PharmD. BCPS 04/05/2020 8:52 PM

## 2020-03-25 ENCOUNTER — Telehealth: Payer: Self-pay | Admitting: Internal Medicine

## 2020-03-25 ENCOUNTER — Inpatient Hospital Stay (HOSPITAL_COMMUNITY): Payer: Medicare Other

## 2020-03-25 DIAGNOSIS — R569 Unspecified convulsions: Secondary | ICD-10-CM

## 2020-03-25 DIAGNOSIS — R0689 Other abnormalities of breathing: Secondary | ICD-10-CM

## 2020-03-25 DIAGNOSIS — I469 Cardiac arrest, cause unspecified: Principal | ICD-10-CM

## 2020-03-25 DIAGNOSIS — I462 Cardiac arrest due to underlying cardiac condition: Secondary | ICD-10-CM

## 2020-03-25 DIAGNOSIS — R609 Edema, unspecified: Secondary | ICD-10-CM

## 2020-03-25 LAB — TYPE AND SCREEN
ABO/RH(D): O POS
Antibody Screen: NEGATIVE
Unit division: 0
Unit division: 0

## 2020-03-25 LAB — FERRITIN: Ferritin: 26 ng/mL (ref 24–336)

## 2020-03-25 LAB — MAGNESIUM: Magnesium: 2.2 mg/dL (ref 1.7–2.4)

## 2020-03-25 LAB — CBC
HCT: 30.9 % — ABNORMAL LOW (ref 39.0–52.0)
HCT: 33.6 % — ABNORMAL LOW (ref 39.0–52.0)
Hemoglobin: 9.2 g/dL — ABNORMAL LOW (ref 13.0–17.0)
Hemoglobin: 10.1 g/dL — ABNORMAL LOW (ref 13.0–17.0)
MCH: 25.8 pg — ABNORMAL LOW (ref 26.0–34.0)
MCH: 26.1 pg (ref 26.0–34.0)
MCHC: 29.8 g/dL — ABNORMAL LOW (ref 30.0–36.0)
MCHC: 30.1 g/dL (ref 30.0–36.0)
MCV: 86.6 fL (ref 80.0–100.0)
MCV: 86.8 fL (ref 80.0–100.0)
Platelets: 190 10*3/uL (ref 150–400)
Platelets: 206 10*3/uL (ref 150–400)
RBC: 3.57 MIL/uL — ABNORMAL LOW (ref 4.22–5.81)
RBC: 3.87 MIL/uL — ABNORMAL LOW (ref 4.22–5.81)
RDW: 16.2 % — ABNORMAL HIGH (ref 11.5–15.5)
RDW: 16.9 % — ABNORMAL HIGH (ref 11.5–15.5)
WBC: 10.5 10*3/uL (ref 4.0–10.5)
WBC: 13.8 10*3/uL — ABNORMAL HIGH (ref 4.0–10.5)
nRBC: 0.4 % — ABNORMAL HIGH (ref 0.0–0.2)
nRBC: 0.2 % (ref 0.0–0.2)

## 2020-03-25 LAB — VITAMIN B12: Vitamin B-12: 815 pg/mL (ref 180–914)

## 2020-03-25 LAB — ECHOCARDIOGRAM COMPLETE
AR max vel: 3.4 cm2
AV Area VTI: 2.98 cm2
AV Area mean vel: 3.87 cm2
AV Mean grad: 5 mmHg
AV Peak grad: 10.9 mmHg
Ao pk vel: 1.65 m/s
Area-P 1/2: 1.98 cm2
Height: 68 in
S' Lateral: 3.1 cm
Weight: 1964.74 oz

## 2020-03-25 LAB — RETICULOCYTES
Immature Retic Fract: 17.3 % — ABNORMAL HIGH (ref 2.3–15.9)
RBC.: 3.55 MIL/uL — ABNORMAL LOW (ref 4.22–5.81)
Retic Count, Absolute: 140.5 10*3/uL (ref 19.0–186.0)
Retic Ct Pct: 2.8 % (ref 0.4–3.1)

## 2020-03-25 LAB — BASIC METABOLIC PANEL
Anion gap: 9 (ref 5–15)
BUN: 33 mg/dL — ABNORMAL HIGH (ref 8–23)
CO2: 28 mmol/L (ref 22–32)
Calcium: 8 mg/dL — ABNORMAL LOW (ref 8.9–10.3)
Chloride: 106 mmol/L (ref 98–111)
Creatinine, Ser: 1.36 mg/dL — ABNORMAL HIGH (ref 0.61–1.24)
GFR calc Af Amer: 52 mL/min — ABNORMAL LOW (ref >60)
GFR calc non Af Amer: 45 mL/min — ABNORMAL LOW (ref >60)
Glucose, Bld: 132 mg/dL — ABNORMAL HIGH (ref 70–99)
Potassium: 4.1 mmol/L (ref 3.5–5.1)
Sodium: 143 mmol/L (ref 135–145)

## 2020-03-25 LAB — POCT I-STAT 7, (LYTES, BLD GAS, ICA,H+H)
Acid-Base Excess: 4 mmol/L — ABNORMAL HIGH (ref 0.0–2.0)
Bicarbonate: 29.3 mmol/L — ABNORMAL HIGH (ref 20.0–28.0)
Calcium, Ion: 1.14 mmol/L — ABNORMAL LOW (ref 1.15–1.40)
HCT: 29 % — ABNORMAL LOW (ref 39.0–52.0)
Hemoglobin: 9.9 g/dL — ABNORMAL LOW (ref 13.0–17.0)
O2 Saturation: 97 %
Patient temperature: 37
Potassium: 4.1 mmol/L (ref 3.5–5.1)
Sodium: 147 mmol/L — ABNORMAL HIGH (ref 135–145)
TCO2: 31 mmol/L (ref 22–32)
pCO2 arterial: 45.5 mmHg (ref 32.0–48.0)
pH, Arterial: 7.418 (ref 7.350–7.450)
pO2, Arterial: 87 mmHg (ref 83.0–108.0)

## 2020-03-25 LAB — BPAM RBC
Blood Product Expiration Date: 202108292359
Blood Product Expiration Date: 202108312359
ISSUE DATE / TIME: 202108012152
ISSUE DATE / TIME: 202108012354
Unit Type and Rh: 5100
Unit Type and Rh: 5100

## 2020-03-25 LAB — IRON AND TIBC
Iron: 78 ug/dL (ref 45–182)
Saturation Ratios: 30 % (ref 17.9–39.5)
TIBC: 263 ug/dL (ref 250–450)
UIBC: 185 ug/dL

## 2020-03-25 LAB — GLUCOSE, CAPILLARY
Glucose-Capillary: 108 mg/dL — ABNORMAL HIGH (ref 70–99)
Glucose-Capillary: 135 mg/dL — ABNORMAL HIGH (ref 70–99)
Glucose-Capillary: 137 mg/dL — ABNORMAL HIGH (ref 70–99)

## 2020-03-25 LAB — FOLATE: Folate: 43.8 ng/mL (ref >5.9)

## 2020-03-25 LAB — PHOSPHORUS: Phosphorus: 3.2 mg/dL (ref 2.5–4.6)

## 2020-03-25 LAB — MRSA PCR SCREENING: MRSA by PCR: NEGATIVE

## 2020-03-25 LAB — TROPONIN I (HIGH SENSITIVITY): Troponin I (High Sensitivity): 214 ng/L (ref <18)

## 2020-03-25 LAB — TSH: TSH: 9.853 u[IU]/mL — ABNORMAL HIGH (ref 0.350–4.500)

## 2020-03-25 LAB — LACTIC ACID, PLASMA: Lactic Acid, Venous: 1.4 mmol/L (ref 0.5–1.9)

## 2020-03-25 MED ORDER — ALBUMIN HUMAN 25 % IV SOLN
25.0000 g | Freq: Once | INTRAVENOUS | Status: AC
  Administered 2020-03-25: 25 g via INTRAVENOUS
  Filled 2020-03-25: qty 100

## 2020-03-25 MED ORDER — FUROSEMIDE 10 MG/ML IJ SOLN
40.0000 mg | Freq: Once | INTRAMUSCULAR | Status: AC
  Administered 2020-03-25: 40 mg via INTRAVENOUS
  Filled 2020-03-25: qty 4

## 2020-03-25 MED ORDER — LEVOTHYROXINE SODIUM 75 MCG PO TABS: 75.0000 ug | ORAL_TABLET | Freq: Every day | ORAL | Status: DC

## 2020-03-25 MED ORDER — POLYETHYLENE GLYCOL 3350 17 G PO PACK
17.0000 g | PACK | Freq: Every day | ORAL | Status: DC
  Administered 2020-03-26 – 2020-03-27 (×2): 17 g via ORAL
  Filled 2020-03-25 (×2): qty 1

## 2020-03-25 MED ORDER — DOCUSATE SODIUM 50 MG/5ML PO LIQD
100.0000 mg | Freq: Two times a day (BID) | ORAL | Status: DC
  Administered 2020-03-26 – 2020-03-27 (×3): 100 mg via ORAL
  Filled 2020-03-25 (×4): qty 10

## 2020-03-25 MED ORDER — FENTANYL CITRATE (PF) 100 MCG/2ML IJ SOLN
25.0000 ug | INTRAMUSCULAR | Status: DC | PRN
  Administered 2020-03-26 (×3): 100 ug via INTRAVENOUS
  Filled 2020-03-25 (×3): qty 2

## 2020-03-25 MED ORDER — LEVOTHYROXINE SODIUM 75 MCG PO TABS
75.0000 ug | ORAL_TABLET | Freq: Every day | ORAL | Status: DC
  Administered 2020-03-25 – 2020-03-31 (×7): 75 ug
  Filled 2020-03-25 (×7): qty 1

## 2020-03-25 MED ORDER — NOREPINEPHRINE 4 MG/250ML-% IV SOLN
0.0000 ug/min | INTRAVENOUS | Status: DC
  Administered 2020-03-25: 2 ug/min via INTRAVENOUS
  Filled 2020-03-25: qty 250

## 2020-03-25 MED ORDER — FENTANYL CITRATE (PF) 100 MCG/2ML IJ SOLN: 25.0000 ug | INTRAMUSCULAR | Status: DC | PRN

## 2020-03-25 MED ORDER — IPRATROPIUM-ALBUTEROL 0.5-2.5 (3) MG/3ML IN SOLN
3.0000 mL | Freq: Four times a day (QID) | RESPIRATORY_TRACT | Status: DC
  Administered 2020-03-25 (×4): 3 mL via RESPIRATORY_TRACT
  Filled 2020-03-25 (×4): qty 3

## 2020-03-25 MED ORDER — SODIUM CHLORIDE 0.9 % IV SOLN
250.0000 mL | INTRAVENOUS | Status: DC
  Administered 2020-03-25 – 2020-03-29 (×2): 250 mL via INTRAVENOUS

## 2020-03-25 MED ORDER — PIPERACILLIN-TAZOBACTAM 3.375 G IVPB
3.3750 g | Freq: Three times a day (TID) | INTRAVENOUS | Status: AC
  Administered 2020-03-25 – 2020-03-31 (×18): 3.375 g via INTRAVENOUS
  Filled 2020-03-25 (×17): qty 50

## 2020-03-25 MED ORDER — DOCUSATE SODIUM 50 MG/5ML PO LIQD
100.0000 mg | Freq: Two times a day (BID) | ORAL | Status: DC | PRN
  Administered 2020-03-27: 100 mg

## 2020-03-25 MED ORDER — IPRATROPIUM-ALBUTEROL 0.5-2.5 (3) MG/3ML IN SOLN
3.0000 mL | Freq: Three times a day (TID) | RESPIRATORY_TRACT | Status: DC
  Administered 2020-03-26 – 2020-03-31 (×16): 3 mL via RESPIRATORY_TRACT
  Filled 2020-03-25 (×16): qty 3

## 2020-03-25 MED ORDER — ASPIRIN EC 81 MG PO TBEC
81.0000 mg | DELAYED_RELEASE_TABLET | Freq: Every day | ORAL | Status: DC
  Administered 2020-03-25 – 2020-03-27 (×3): 81 mg via ORAL
  Filled 2020-03-25 (×3): qty 1

## 2020-03-25 MED ORDER — POLYETHYLENE GLYCOL 3350 17 G PO PACK: 17.0000 g | PACK | Freq: Every day | ORAL | Status: DC | PRN

## 2020-03-25 MED ORDER — PERFLUTREN LIPID MICROSPHERE
1.0000 mL | INTRAVENOUS | Status: AC | PRN
  Administered 2020-03-25: 5 mL via INTRAVENOUS
  Filled 2020-03-25: qty 10

## 2020-03-25 MED FILL — Medication: Qty: 1 | Status: AC

## 2020-03-25 NOTE — Progress Notes (Signed)
[name] Patient Name: Raymond Swanson DOB: 26-Jan-1925 MRN: [id number]   Date of Service  03/25/2020  [name]  Patient starting to breathe over vent and BP increasing. RN concerned about some discomfort in setting of ETT (not currently on any sedation or analgesia).   [name]  Ordered intermittent fentanyl pushes for pain mgmt while intubated.     Intervention Category Intermediate Interventions: Pain - evaluation and management  Charlott Rakes 03/25/2020, 10:37 PM

## 2020-03-25 NOTE — Progress Notes (Signed)
  Echocardiogram 2D Echocardiogram has been performed with Definity.  Raymond Swanson 03/25/2020, 2:38 PM

## 2020-03-25 NOTE — Progress Notes (Signed)
[name] Patient Name: Raymond Swanson DOB: November 05, 1924 [name]   Date of Service  03/25/2020  [name]  [name]   [name]  Plan: 1. 25% Albumin 25 gm IV now.  [name]     [name]  [name] 03/25/2020, 3:05 AM

## 2020-03-25 NOTE — Procedures (Signed)
Patient Name: Raymond Swanson  MRN: [id number]  Epilepsy Attending: Lora Havens  Referring Physician/Provider: Elease Etienne, PA Date: 03/25/2020 Duration: 24.38 minutes  Patient history: 86 year old male status post cardiac arrest.  EEG to evaluate for seizures.  Level of alertness: comatose  AEDs during EEG study: None  Technical aspects: This EEG study was done with scalp electrodes positioned according to the 10-20 International system of electrode placement. Electrical activity was acquired at a sampling rate of 500Hz  and reviewed with a high frequency filter of 70Hz  and a low frequency filter of 1Hz . EEG data were recorded continuously and digitally stored.   Description: EEG showed intermittent generalized periodic epileptiform discharges with triphasic morphology at 0.5 to 1 Hz as well as background suppression lasting 2 to 4 seconds.  EEG was reactive to noxious stimuli.  Hyperventilation and photic stimulation were not performed.     ABNORMALITY -Periodic epileptiform discharges with triphasic morphology, generalized -Background suppression, generalized  IMPRESSION: This study showed evidence of generalized epileptogenic city as well as profound diffuse encephalopathy, nonspecific to etiology but likely related to anoxic/hypoxic brain injury, sedation.  No seizures were seen during the study.  Bich Mchaney Barbra Sarks

## 2020-03-25 NOTE — Progress Notes (Signed)
RT attempted SBT with pt this am. Pt placed on 8/5 CPAP/PS with adequate tidal volumes. Pt with low RR and eventually apneas. Backup RR kicked in multiple times. Pt lasted 7 minutes before being placed back on full support. Dr. Tamala Julian and RN notified. RT will attempt again later per MD request. RT will continue to monitor.

## 2020-03-25 NOTE — Progress Notes (Signed)
EEG complete - results pending 

## 2020-03-25 NOTE — Plan of Care (Signed)

## 2020-03-25 NOTE — Progress Notes (Signed)
Met with patient's son to discuss next steps.  He says his dad was pretty independent and would not be interested in [facility] or SNF.  Idea here is to wait and hope for full neurological recovery with plans for a more palliative approach if this does not occur.  Regarding what to do in the event of another arrest, the son would like for him to be allowed to pass in peace.  Son has to run back to [place] for an important meeting but will be back Friday and we will revisit [facility] at that time.  In interim, continue TTM, full scope of care as outlined with exception of DNR.  Erskine Emery MD PCCM

## 2020-03-25 NOTE — Telephone Encounter (Addendum)
Lisa Roca (on [facility]) stated the pt fell last Monday and he had another fall last night that took him to the ER. [name] stated that she spoke to a [place] from [place] about getting some additional help since he is high risk for falling and was told they can't get anyone out until later September and was suppose to call her back in-regards to this. [name] would like to make sure his PCP is aware of his situation and to see what help can be given in regards to his falls. She stated even a small walk to the bathroom the pt is unable to do so.   [name] can be reached at [phone] to leave VM

## 2020-03-25 NOTE — Plan of Care (Signed)
On Targeted temperature management 37C   Problem: Clinical Measurements: Goal: Ability to maintain clinical measurements within normal limits will improve Outcome: Progressing Goal: Will remain free from infection Outcome: Progressing  Goal: Diagnostic test results will improve Outcome: Progressing Goal: Respiratory complications will improve Outcome: Progressing Goal: Cardiovascular complication will be avoided Outcome: Progressing   Problem: Elimination: Goal: Will not experience complications related to bowel motility Outcome: Progressing Goal: Will not experience complications related to urinary retention Outcome: Progressing Decreased output, Received lasix push, monitoring closely.   Problem: Pain Managment: Goal: General experience of comfort will improve Outcome: Progressing  Fentanyl gtt, repositioning for comfort and skin integrity.   Problem: Safety: Goal: Ability to remain free from injury will improve Outcome: Progressing   Problem: Skin Integrity: Goal: Risk for impaired skin integrity will decrease Outcome: Progressing  Foam on skin tears/stage I, propping with pillows, repositioning q 2 and prn.

## 2020-03-25 NOTE — Plan of Care (Signed)

## 2020-03-25 NOTE — Progress Notes (Signed)
Bilateral lower extremity venous duplex completed. Refer to "CV Proc" under chart review to view preliminary results.  03/25/2020 12:36 PM Kelby Aline., MHA, RVT, RDCS, RDMS

## 2020-03-25 NOTE — Progress Notes (Addendum)
Approx [id number]  Arrives to 2H10 from ED, s/p cardiac arrest at home. Intubated, breathing over vent, decorticate posturing when care given, no purposeful movement. VSS. eLink notified via box of pt arrival to [facility]  Orders for TTM goal 37C. Temp on arrival =36C, pads placed at 2345. Bair Hugger for counter warming d/t shivering.   Skin swarm done with Candyce Churn, RN.  Stage I bilat heels, foam placed, heels elevated on pillows, prevalon boots ordered  Abrasion to chest under defib/CPR pad. Abrasion on R hip.   Diffuse ecchymosis bilat arms, chest, shoulders, hands. Multiple skin tears - L forearem x 2, L upper arm (large across back of arm), R elbow/arm x 3, L posterior shoulder x 2, back, L shin. Assessed, cleansed and foam applied.   Skin very dry, flaky, multiple small healing scabs, diffuse skin lesions including lateral R external nares, R earlobe, back/top of head.   Small cut L upper lip. Cleansed.   1 unit PRBC infusing, verified tag/blood/pt armband. Second unit ordered. Fentanyl gtt infusing.   See flowsheet for complete assessment.

## 2020-03-25 NOTE — Progress Notes (Signed)
NAME:  Raymond Swanson, MRN:  [id number], DOB:  1925-07-28, LOS: 1 ADMISSION DATE:  04/20/2020, CONSULTATION DATE:  03/25/20 REFERRING MD:  EDP, CHIEF COMPLAINT:  Cardiac Arrest   Brief History   86 year old male with past medical history significant for CAD, TAVR GI bleed who lives at home and was reportedly in his usual state of health when he had a witnessed collapse.  On EMS arrival patient was in PEA arrest, unclear downtime prior to this.  10 minutes ACLS and epi x1 with ROSC.  Patient intubated and PCCM consulted for admission.  History of present illness   Raymond Swanson is a 86 year old male with past medical history of CAD status post CABG x3, TAVR, HFpEF, prostate and bladder cancer who lives at home and reportedly had a collapsed in the bathroom.  Wife has dementia and so history somewhat unclear.  Patient had somewhere around 3 to 5 minutes of downtime prior to EMS arrival, he was found in PEA arrest.  ACLS performed for 10 minutes with epi x1 before ROSC.  He was intubated and transported to the ED.  In the ED, patient was hemodynamically stable and CT head was negative.  EKG without signs of STEMI.  Labs showed no severe metabolic derangements, lactic acid 5.2, anemic with hemoglobin of 6.4.  Chest x-ray with likely mild edema.  Urinalysis positive for UTI.  He was given 2 units PRBCs, has a history of GI bleed in the past so GI was consulted and he was started on Protonix.  PCCM consulted for admission  Past Medical History   has a past medical history of Acute gastric ulcer with hemorrhage, without mention of obstruction (09/10/2009), ANEMIA (10/08/2009), CORONARY ARTERY DISEASE (01/26/2007), Dyspnea, History of kidney stones, HOH (hard of hearing), bladder cancer, HYPERLIPIDEMIA (01/26/2007), HYPERTENSION (01/26/2007), HYPOTHYROIDISM (10/18/2007), PROSTATE CANCER, HX OF (01/26/2007), SKIN CANCER, HX OF (01/26/2007), and Subdural hematoma ([place]) (2016).   Significant Hospital Events   8/1 Admit to  PCCM  Consults:    Procedures:  8/1 ETT  Significant Diagnostic Tests:  8/1 CT head>>No acute intracranial abnormality.  Stable areas of encephalomalacia in the right frontal lobe and anterior right temporal pole as well as sequela of prior lacunar infarcts in the bilateral basal ganglia.  8/1 CXR>>Mild diffuse increased interstitial markings throughout both lungs which could be due to interstitial edema and/or infectious etiology.  Micro Data:  8/1 MRSA screen>> 8/1 urine culture>> 8/1 blood cultures x2>> 8/1 Covid-19>> negative  Antimicrobials:  Cefepime 8/1-8/2 Zosyn 8/2>> 8/9 (planned)  Interim history/subjective:  No events, remains poorly responsive on vent and minimal secretion.  Objective   Blood pressure (!) 113/58, pulse 67, temperature 98.6 F (37 C), temperature source Esophageal, resp. rate 20, height 5\' 8"  (1.727 m), weight 55.7 kg, SpO2 100 %.    Vent Mode: PRVC FiO2 (%):  [40 %-100 %] 40 % Set Rate:  [20 bmp] 20 bmp Vt Set:  [550 mL] 550 mL PEEP:  [5 cmH20] 5 cmH20 Plateau Pressure:  [16 cmH20-19 cmH20] 19 cmH20   Intake/Output Summary (Last 24 hours) at 03/25/2020 0741 Last data filed at 03/25/2020 0700 Gross per 24 hour  Intake 2929.91 ml  Output 665 ml  Net 2264.91 ml   Filed Weights   04/08/2020 2015 03/25/20 0000 03/25/20 0200  Weight: 54 kg 55.7 kg 55.7 kg   GEN: frail elderly cachetic man on vent HEENT: ETT in place, mild thin secretions, poor dentition CV: RRR, ext warm PULM: Scattered rhonci, no wheezing  GI: Soft, +BS EXT: Muscle wasting, no edema NEURO: pupils reactive, he has oculocephalic reflex only on left side, he has corneals, triggers vent, has cough/gag, flicker flexion response to pain PSYCH: cannot assess SKIN: ecchymoses, psoriatic type plaques, thin skin  ABG looks good Chemistries benign Mild ALI on admission   Resolved Hospital Problem list     Assessment & Plan:   Witnessed out of hospital PEA arrest with acute  respiratory failure With at least 3-5 minutes downtime before EMS arrival, 10 minutes ACLS with epi x1 because is currently unclear, CT head is negative, no major metabolic disturbances.  He lives with wife who has dementia, so history is not reliable - f/u echo, EEG - TTM 36C protocol - Discuss GoC with family when they come in  UTI with indwelling Foley - Recent infection with enterobacter - Change abx to zosyn x 7 days given resistance profile  Acute on chronic anemia As a history of iron deficiency anemia as well as probable upper GI bleed (had refused GI work-up in the past) on aspirin and Plavix -Hemoglobin 6.4 baseline appears to be between 7 and 8 -Given 2 units PRBCs in the ED and started on Protonix, no reported history of recent bleeding -Monitor H&H and hold plavix - Do not think immediate GI workup indicated given everything else going on and since patient has refused this in past but defer to GI since already called  History of CAD s/p CABG x3, NSVT, severe AS s/p TAVR 04/2019, HFpEF  - Plavix on hold - Will resume baby aspirin  CKD stage III -Stable  History of prostate cancer bladder cancer s/p TURBT -Change Foley, hold Myrbetriq  Hypothyroidism- TSH mildly elevated, nothing to do with this in context of acute illness  Best practice:  Diet: N.p.o. Pain/Anxiety/Delirium protocol (if indicated): Fentanyl gtt. VAP protocol (if indicated): HOB 30 degrees, suction as needed DVT prophylaxis: SCDs GI prophylaxis: PPI Glucose control: SSI Mobility: Bedrest Code Status: Full code Family Communication: will update when they come in Disposition: ICU    The patient is critically ill with multiple organ systems failure and requires high complexity decision making for assessment and support, frequent evaluation and titration of therapies, application of advanced monitoring technologies and extensive interpretation of multiple databases. Critical Care Time devoted to  patient care services described in this note independent of APP/resident time (if applicable)  is 34 minutes.   Erskine Emery MD [facility] 03/25/2020 7:48 AM Personal pager: [phone] If unanswered, please page CCM On-call: [phone]

## 2020-03-26 ENCOUNTER — Inpatient Hospital Stay (HOSPITAL_COMMUNITY): Payer: Medicare Other

## 2020-03-26 DIAGNOSIS — E43 Unspecified severe protein-calorie malnutrition: Secondary | ICD-10-CM | POA: Insufficient documentation

## 2020-03-26 LAB — MAGNESIUM
Magnesium: 2.1 mg/dL (ref 1.7–2.4)
Magnesium: 2 mg/dL (ref 1.7–2.4)

## 2020-03-26 LAB — GLUCOSE, CAPILLARY
Glucose-Capillary: 101 mg/dL — ABNORMAL HIGH (ref 70–99)
Glucose-Capillary: 107 mg/dL — ABNORMAL HIGH (ref 70–99)
Glucose-Capillary: 115 mg/dL — ABNORMAL HIGH (ref 70–99)
Glucose-Capillary: 115 mg/dL — ABNORMAL HIGH (ref 70–99)
Glucose-Capillary: 116 mg/dL — ABNORMAL HIGH (ref 70–99)
Glucose-Capillary: 116 mg/dL — ABNORMAL HIGH (ref 70–99)
Glucose-Capillary: 121 mg/dL — ABNORMAL HIGH (ref 70–99)
Glucose-Capillary: 121 mg/dL — ABNORMAL HIGH (ref 70–99)
Glucose-Capillary: 132 mg/dL — ABNORMAL HIGH (ref 70–99)

## 2020-03-26 LAB — BASIC METABOLIC PANEL
Anion gap: 13 (ref 5–15)
BUN: 32 mg/dL — ABNORMAL HIGH (ref 8–23)
CO2: 26 mmol/L (ref 22–32)
Calcium: 8.2 mg/dL — ABNORMAL LOW (ref 8.9–10.3)
Chloride: 106 mmol/L (ref 98–111)
Creatinine, Ser: 1.62 mg/dL — ABNORMAL HIGH (ref 0.61–1.24)
GFR calc Af Amer: 42 mL/min — ABNORMAL LOW (ref >60)
GFR calc non Af Amer: 36 mL/min — ABNORMAL LOW (ref >60)
Glucose, Bld: 115 mg/dL — ABNORMAL HIGH (ref 70–99)
Potassium: 3.9 mmol/L (ref 3.5–5.1)
Sodium: 145 mmol/L (ref 135–145)

## 2020-03-26 LAB — HEPATIC FUNCTION PANEL
ALT: 81 U/L — ABNORMAL HIGH (ref 0–44)
AST: 74 U/L — ABNORMAL HIGH (ref 15–41)
Albumin: 2.4 g/dL — ABNORMAL LOW (ref 3.5–5.0)
Alkaline Phosphatase: 170 U/L — ABNORMAL HIGH (ref 38–126)
Bilirubin, Direct: 0.2 mg/dL (ref 0.0–0.2)
Indirect Bilirubin: 1.2 mg/dL — ABNORMAL HIGH (ref 0.3–0.9)
Total Bilirubin: 1.4 mg/dL — ABNORMAL HIGH (ref 0.3–1.2)
Total Protein: 5.5 g/dL — ABNORMAL LOW (ref 6.5–8.1)

## 2020-03-26 LAB — CBC
HCT: 32.6 % — ABNORMAL LOW (ref 39.0–52.0)
Hemoglobin: 9.9 g/dL — ABNORMAL LOW (ref 13.0–17.0)
MCH: 26.4 pg (ref 26.0–34.0)
MCHC: 30.4 g/dL (ref 30.0–36.0)
MCV: 86.9 fL (ref 80.0–100.0)
Platelets: 190 10*3/uL (ref 150–400)
RBC: 3.75 MIL/uL — ABNORMAL LOW (ref 4.22–5.81)
RDW: 17.1 % — ABNORMAL HIGH (ref 11.5–15.5)
WBC: 11.9 10*3/uL — ABNORMAL HIGH (ref 4.0–10.5)
nRBC: 0.2 % (ref 0.0–0.2)

## 2020-03-26 LAB — PHOSPHORUS
Phosphorus: 3.1 mg/dL (ref 2.5–4.6)
Phosphorus: 3.1 mg/dL (ref 2.5–4.6)

## 2020-03-26 LAB — TRIGLYCERIDES: Triglycerides: 94 mg/dL (ref <150)

## 2020-03-26 MED ORDER — LACTATED RINGERS IV SOLN: INTRAVENOUS | Status: DC

## 2020-03-26 MED ORDER — PROSOURCE TF PO LIQD
45.0000 mL | Freq: Two times a day (BID) | ORAL | Status: DC
  Administered 2020-03-26 – 2020-03-31 (×11): 45 mL
  Filled 2020-03-26 (×10): qty 45

## 2020-03-26 MED ORDER — OSMOLITE 1.2 CAL PO LIQD
1000.0000 mL | ORAL | Status: DC
  Administered 2020-03-26 – 2020-03-30 (×5): 1000 mL
  Filled 2020-03-26 (×8): qty 1000

## 2020-03-26 MED ORDER — FREE WATER
200.0000 mL | Status: DC
  Administered 2020-03-26 – 2020-03-27 (×7): 200 mL

## 2020-03-26 MED ORDER — VANCOMYCIN HCL IN DEXTROSE 1-5 GM/200ML-% IV SOLN
1000.0000 mg | INTRAVENOUS | Status: DC
  Administered 2020-03-26: 1000 mg via INTRAVENOUS
  Filled 2020-03-26: qty 200

## 2020-03-26 MED ORDER — PROPOFOL 1000 MG/100ML IV EMUL
5.0000 ug/kg/min | INTRAVENOUS | Status: DC
  Administered 2020-03-26: 5 ug/kg/min via INTRAVENOUS
  Administered 2020-03-26 – 2020-03-28 (×6): 30 ug/kg/min via INTRAVENOUS
  Filled 2020-03-26 (×7): qty 100

## 2020-03-26 NOTE — Progress Notes (Signed)
Assisted tele visit to patient with family member.  Raymond Swanson P, RN  

## 2020-03-26 NOTE — Progress Notes (Signed)
Pharmacy Antibiotic Note  Raymond Swanson is a 86 y.o. male admitted on 03/28/2020 with UTI.  Pharmacy has been consulted for vancomycin dosing.  Urine cx growing 80k staph aureus, 90k GNR - previous cx grew pseudomonas. WBC appropriately trending down to 11.9, afebrile. Scr 1.62 (CrCl 22 mL/min).   Plan: Vancomycin 1000 mg IV every 48 hours  Monitor renal fx, cx results, clinical pic, and for opportunities to de-escalate   Height: 5\' 8"  (172.7 cm) Weight: 56.4 kg (124 lb 5.4 oz) IBW/kg (Calculated) : 68.4  Temp (24hrs), Avg:98.6 F (37 C), Min:98.4 F (36.9 C), Max:98.8 F (37.1 C)  Recent Labs  Lab 03/30/2020 1948 04/13/2020 1950 03/25/20 0538 03/25/20 1653 03/26/20 0144  WBC 7.2  --  10.5 13.8* 11.9*  CREATININE 1.44*  --  1.36*  --  1.62*  LATICACIDVEN  --  5.4* 1.4  --   --     Estimated Creatinine Clearance: 22.7 mL/min (A) (by C-G formula based on SCr of 1.62 mg/dL (H)).    No Known Allergies  Antimicrobials this admission: Cefepime 8/1 x1 Zosyn 8/2 >>  Vancomycin 8/3>>  Dose adjustments this admission: N/A  Microbiology results: Bcx 8/1: ngtd  Ucx 8/1: 70k staph aureus, 90k GNR  Ucx 7/26: enterobacter (R cefazolin, I macrobid)  Thank you for allowing pharmacy to be a part of this patient's care.  [name], PharmD, [place] Clinical Pharmacist  Phone: [phone] 03/26/2020 9:13 AM  Please check AMION for all [facility] phone numbers After 10:00 PM, call [place] [phone]

## 2020-03-26 NOTE — Telephone Encounter (Signed)
I see is in critical condition at the hospital. Will monitor.

## 2020-03-26 NOTE — Progress Notes (Signed)
Initial Nutrition Assessment  DOCUMENTATION CODES:   Severe malnutrition in context of chronic illness  INTERVENTION:   Tube Feeding:  Osmolite 1.2 at 55 ml/hr Begin TF at rate of 20 ml/hr; titrate by 10 mL q 8 hours until goal rate of 55 ml/hr ProSource TF 45 mL daily Provides 1624 kcals, 84 g of protein and 1069 mL of free water  NUTRITION DIAGNOSIS:   Severe Malnutrition related to chronic illness as evidenced by severe fat depletion, severe muscle depletion.  GOAL:   Patient will meet greater than or equal to 90% of their needs  MONITOR:   Vent status, Labs, Weight trends, TF tolerance, Skin  REASON FOR ASSESSMENT:   Ventilator    ASSESSMENT:   86 yo male admitted post witness PEA arrest with concern for anoxic injury, AKI on CKD. PMH includes HTN, CAD/CABG, CHF, HLD   DNR, further decisions on GOC once son arrives on Friday  TTM to avoid fevers; maintain temp between 36.5-37.5 Patient is currently intubated on ventilator support MV: 10.8 L/min Temp (24hrs), Avg:98.6 F (37 C), Min:98.4 F (36.9 C), Max:99 F (37.2 C)  Propofol: 6.5 ml/hr  OG in stomach  Current weight 56.4 kg; admit weight 54 kg  Unable to obtain diet and weight history from pt at this time  Labs: BUN 32, Creatinine 1.62, sodium 145  Meds: LR at 75 ml/hr, colace BID  NUTRITION - FOCUSED PHYSICAL EXAM:    Most Recent Value  Orbital Region Severe depletion  Upper Arm Region Severe depletion  Thoracic and Lumbar Region Unable to assess  Buccal Region Severe depletion  Temple Region Severe depletion  Clavicle Bone Region Severe depletion  Clavicle and Acromion Bone Region Severe depletion  Scapular Bone Region Severe depletion  Dorsal Hand Severe depletion  Patellar Region Unable to assess  Anterior Thigh Region Unable to assess  Posterior Calf Region Unable to assess  Mouth --  [poor dentition]       Diet Order:   Diet Order            Diet NPO time specified  Diet  effective now                 EDUCATION NEEDS:   Not appropriate for education at this time  Skin:  Skin Assessment: Reviewed RN Assessment  Last BM:  no documented BM  Height:   Ht Readings from Last 1 Encounters:  04/10/2020 5\' 8"  (1.727 m)    Weight:   Wt Readings from Last 1 Encounters:  03/26/20 56.4 kg   BMI:  Body mass index is 18.91 kg/m.  Estimated Nutritional Needs:   Kcal:  1500-1600 kcal  Protein:  75-85 g  Fluid:  >/= 1.5 L    Kerman Passey MS, RDN, LDN, CNSC Registered Dietitian III Clinical Nutrition RD Pager and On-Call Pager Number Located in [place]

## 2020-03-26 NOTE — Progress Notes (Signed)
NAME:  Raymond Swanson, MRN:  [id number], DOB:  1924/09/01, LOS: 2 ADMISSION DATE:  03/28/2020, CONSULTATION DATE:  03/26/20 REFERRING MD:  EDP, CHIEF COMPLAINT:  Cardiac Arrest   Brief History   86 year old male with past medical history significant for CAD, TAVR GI bleed who lives at home and was reportedly in his usual state of health when he had a witnessed collapse.  On EMS arrival patient was in PEA arrest, unclear downtime prior to this.  10 minutes ACLS and epi x1 with ROSC.  Patient intubated and PCCM consulted for admission.  History of present illness   Raymond Swanson is a 86 year old male with past medical history of CAD status post CABG x3, TAVR, HFpEF, prostate and bladder cancer who lives at home and reportedly had a collapsed in the bathroom.  Wife has dementia and so history somewhat unclear.  Patient had somewhere around 3 to 5 minutes of downtime prior to EMS arrival, he was found in PEA arrest.  ACLS performed for 10 minutes with epi x1 before ROSC.  He was intubated and transported to the ED.  In the ED, patient was hemodynamically stable and CT head was negative.  EKG without signs of STEMI.  Labs showed no severe metabolic derangements, lactic acid 5.2, anemic with hemoglobin of 6.4.  Chest x-ray with likely mild edema.  Urinalysis positive for UTI.  He was given 2 units PRBCs, has a history of GI bleed in the past so GI was consulted and he was started on Protonix.  PCCM consulted for admission  Past Medical History   has a past medical history of Acute gastric ulcer with hemorrhage, without mention of obstruction (09/10/2009), ANEMIA (10/08/2009), CORONARY ARTERY DISEASE (01/26/2007), Dyspnea, History of kidney stones, HOH (hard of hearing), bladder cancer, HYPERLIPIDEMIA (01/26/2007), HYPERTENSION (01/26/2007), HYPOTHYROIDISM (10/18/2007), PROSTATE CANCER, HX OF (01/26/2007), SKIN CANCER, HX OF (01/26/2007), and Subdural hematoma ([place]) (2016).   Significant Hospital Events   8/1 Admit to  PCCM 8/2 Code status updated to DNR. EEG concerning for epileptogenicity, diffuse encephalopathy without sedation  8/3 marginally rising Cr. Significant tachycardia and tachypnea to stimulation -- starting on low dose propofol   Consults:    Procedures:  8/1 ETT  Significant Diagnostic Tests:  8/1 CT head>>No acute intracranial abnormality.  Stable areas of encephalomalacia in the right frontal lobe and anterior right temporal pole as well as sequela of prior lacunar infarcts in the bilateral basal ganglia.  8/1 CXR>>Mild diffuse increased interstitial markings throughout both lungs which could be due to interstitial edema and/or infectious etiology.  8/2 EEG> generalized epileptogenicity, diffuse encephalopathy concerning for anoxic brain injury.   8/2 ECHO> LVEF 77-41% Grade I diastolic dysfunction. Mildly reduced RV systolic function. S/p Aortic valve relacement   Micro Data:  8/1 MRSA screen>> 8/1 urine culture> 70000 staph aureus, 90000 GNR 8/1 blood cultures x2>> 8/1 Covid-19>> negative  Antimicrobials:  Cefepime 8/1-8/2 Zosyn 8/2>> 8/9 (planned)  Interim history/subjective:   Started on PRN fentanyl overnight for possible discomfort  Reportedly opened eyes overnight  This morning with significant tachycardia and increased RR to stimulation. Weakly localizes RUE to painful stimuli this morning   Off pressors   Objective   Blood pressure 132/69, pulse (!) 116, temperature 98.4 F (36.9 C), resp. rate 18, height 5\' 8"  (1.727 m), weight 56.4 kg, SpO2 100 %.    Vent Mode: PSV;CPAP FiO2 (%):  [30 %-40 %] 30 % Set Rate:  [20 bmp] 20 bmp Vt Set:  [550 mL] 550  mL PEEP:  [5 cmH20] 5 cmH20 Pressure Support:  [5 cmH20-8 cmH20] 5 cmH20 Plateau Pressure:  [9 cmH20-27 cmH20] 27 cmH20   Intake/Output Summary (Last 24 hours) at 03/26/2020 0758 Last data filed at 03/26/2020 0600 Gross per 24 hour  Intake 320.11 ml  Output 1225 ml  Net -904.89 ml   Filed Weights   03/25/20  0000 03/25/20 0200 03/26/20 0500  Weight: 55.7 kg 55.7 kg 56.4 kg   GEN: Frail, elderly M, critically ill appearing on vent  HEENT: NCAT. ETT OGT secure. Pink mmm.  CV: tachycardic rate regular rhythm. s1s2 no rgm  PULM: CTA bilaterally symmetrical chest expansion mechanically ventilated  GI: soft flat ndnt  EXT: Symmetrical muscle wasting. No obvious joint deformity  NEURO: + cough,  + gag, overbreathing vent. Localizes RUE to pain.  PSYCH: unable to assess  SKIN: Thin skin with + turgor. Scattered ecchymosis. Scattered psoriatic appearing skin plaques    Resolved Hospital Problem list     Assessment & Plan:   Witnessed out of hospital PEA arrest with acute respiratory failure With at least 3-5 minutes downtime before EMS arrival, 10 minutes ACLS with epi x1 because is currently unclear, CT head is negative, no major metabolic disturbances.  He lives with wife who has dementia, so history is not reliable -EEG concerning for possible anoxic injury, with generalized epileptogenicity and diffuse encephalopathy  P -avoid fevers  -DNR, but continue aggressive interventions otherwise -Re-address GOC when son back in state later this week -Adding Prop due to increased RR HR with stimulation  -VAP  -dc NE -- has been off. Supportive care for MAP > 65   UTI -indwelling foley -recent enterobacter infection P - Zosyn x 7 days given resistance profile -follow cx data   Acute on chronic anemia As a history of iron deficiency anemia as well as probable upper GI bleed (had refused GI work-up in the past) on aspirin and Plavix -Hemoglobin 6.4 baseline appears to be between 7 and 8 -Given 2 units PRBCs in the ED and started on Protonix, no reported history of recent bleeding P -trend H/H -holding plavix -defer GI consult at this time  History of CAD s/p CABG x3, NSVT, severe AS s/p TAVR 04/2019, HFpEF  - Plavix on hold - Will resume baby aspirin  CKD stage III -trend renal indices    -UOP  History of prostate cancer bladder cancer s/p TURBT -hold Myrbetriq  Hypothyroidism -synthroid   Best practice:  Diet: N.p.o. Pain/Anxiety/Delirium protocol (if indicated): Fentanyl gtt. VAP protocol (if indicated): HOB 30 degrees, suction as needed DVT prophylaxis: SCDs GI prophylaxis: PPI Glucose control: SSI Mobility: Bedrest Code Status: Full code Family Communication: will update when they come in Disposition: ICU    CRITICAL CARE Performed by: Cristal Generous   Total critical care time: 35 minutes  Critical care time was exclusive of separately billable procedures and treating other patients. Critical care was necessary to treat or prevent imminent or life-threatening deterioration.  Critical care was time spent personally by me on the following activities: development of treatment plan with patient and/or surrogate as well as nursing, discussions with consultants, evaluation of patient's response to treatment, examination of patient, obtaining history from patient or surrogate, ordering and performing treatments and interventions, ordering and review of laboratory studies, ordering and review of radiographic studies, pulse oximetry and re-evaluation of patient's condition.  Eliseo Gum MSN, AGACNP-BC [place] [phone] If no answer, [phone] 03/26/2020, 7:59 AM

## 2020-03-26 NOTE — Telephone Encounter (Signed)
FYI

## 2020-03-27 ENCOUNTER — Inpatient Hospital Stay (HOSPITAL_COMMUNITY): Payer: Medicare Other

## 2020-03-27 LAB — CBC
HCT: 33.7 % — ABNORMAL LOW (ref 39.0–52.0)
Hemoglobin: 10 g/dL — ABNORMAL LOW (ref 13.0–17.0)
MCH: 26.2 pg (ref 26.0–34.0)
MCHC: 29.7 g/dL — ABNORMAL LOW (ref 30.0–36.0)
MCV: 88.2 fL (ref 80.0–100.0)
Platelets: 193 10*3/uL (ref 150–400)
RBC: 3.82 MIL/uL — ABNORMAL LOW (ref 4.22–5.81)
RDW: 17.5 % — ABNORMAL HIGH (ref 11.5–15.5)
WBC: 17.6 10*3/uL — ABNORMAL HIGH (ref 4.0–10.5)
nRBC: 0 % (ref 0.0–0.2)

## 2020-03-27 LAB — BASIC METABOLIC PANEL
Anion gap: 14 (ref 5–15)
BUN: 35 mg/dL — ABNORMAL HIGH (ref 8–23)
CO2: 26 mmol/L (ref 22–32)
Calcium: 8.4 mg/dL — ABNORMAL LOW (ref 8.9–10.3)
Chloride: 102 mmol/L (ref 98–111)
Creatinine, Ser: 1.55 mg/dL — ABNORMAL HIGH (ref 0.61–1.24)
GFR calc Af Amer: 44 mL/min — ABNORMAL LOW (ref >60)
GFR calc non Af Amer: 38 mL/min — ABNORMAL LOW (ref >60)
Glucose, Bld: 109 mg/dL — ABNORMAL HIGH (ref 70–99)
Potassium: 4.3 mmol/L (ref 3.5–5.1)
Sodium: 142 mmol/L (ref 135–145)

## 2020-03-27 LAB — URINE CULTURE: Culture: 70000 — AB

## 2020-03-27 LAB — GLUCOSE, CAPILLARY
Glucose-Capillary: 106 mg/dL — ABNORMAL HIGH (ref 70–99)
Glucose-Capillary: 112 mg/dL — ABNORMAL HIGH (ref 70–99)
Glucose-Capillary: 113 mg/dL — ABNORMAL HIGH (ref 70–99)
Glucose-Capillary: 80 mg/dL (ref 70–99)
Glucose-Capillary: 81 mg/dL (ref 70–99)
Glucose-Capillary: 91 mg/dL (ref 70–99)

## 2020-03-27 LAB — PHOSPHORUS
Phosphorus: 3.1 mg/dL (ref 2.5–4.6)
Phosphorus: 2.7 mg/dL (ref 2.5–4.6)

## 2020-03-27 LAB — HEPATIC FUNCTION PANEL
ALT: 65 U/L — ABNORMAL HIGH (ref 0–44)
AST: 55 U/L — ABNORMAL HIGH (ref 15–41)
Albumin: 2.3 g/dL — ABNORMAL LOW (ref 3.5–5.0)
Alkaline Phosphatase: 134 U/L — ABNORMAL HIGH (ref 38–126)
Bilirubin, Direct: 0.2 mg/dL (ref 0.0–0.2)
Indirect Bilirubin: 0.7 mg/dL (ref 0.3–0.9)
Total Bilirubin: 0.9 mg/dL (ref 0.3–1.2)
Total Protein: 5.7 g/dL — ABNORMAL LOW (ref 6.5–8.1)

## 2020-03-27 LAB — MAGNESIUM
Magnesium: 1.9 mg/dL (ref 1.7–2.4)
Magnesium: 2.6 mg/dL — ABNORMAL HIGH (ref 1.7–2.4)

## 2020-03-27 MED ORDER — MAGNESIUM SULFATE 2 GM/50ML IV SOLN
2.0000 g | Freq: Once | INTRAVENOUS | Status: AC
  Administered 2020-03-27: 2 g via INTRAVENOUS
  Filled 2020-03-27: qty 50

## 2020-03-27 MED ORDER — FREE WATER
200.0000 mL | Freq: Four times a day (QID) | Status: DC
  Administered 2020-03-27 – 2020-03-31 (×16): 200 mL

## 2020-03-27 NOTE — Progress Notes (Signed)
NAME:  Raymond Swanson, MRN:  [id number], DOB:  1925/06/04, LOS: 3 ADMISSION DATE:  04/23/2020, CONSULTATION DATE:  03/27/20 REFERRING MD:  EDP, CHIEF COMPLAINT:  Cardiac Arrest   Brief History   86 year old male with past medical history significant for CAD, TAVR GI bleed who lives at home and was reportedly in his usual state of health when he had a witnessed collapse.  On EMS arrival patient was in PEA arrest, unclear downtime prior to this.  10 minutes ACLS and epi x1 with ROSC.  Patient intubated and PCCM consulted for admission.  History of present illness   Mr. Raymond Swanson is a 86 year old male with past medical history of CAD status post CABG x3, TAVR, HFpEF, prostate and bladder cancer who lives at home and reportedly had a collapsed in the bathroom.  Wife has dementia and so history somewhat unclear.  Patient had somewhere around 3 to 5 minutes of downtime prior to EMS arrival, he was found in PEA arrest.  ACLS performed for 10 minutes with epi x1 before ROSC.  He was intubated and transported to the ED.  In the ED, patient was hemodynamically stable and CT head was negative.  EKG without signs of STEMI.  Labs showed no severe metabolic derangements, lactic acid 5.2, anemic with hemoglobin of 6.4.  Chest x-ray with likely mild edema.  Urinalysis positive for UTI.  He was given 2 units PRBCs, has a history of GI bleed in the past so GI was consulted and he was started on Protonix.  PCCM consulted for admission  Past Medical History   has a past medical history of Acute gastric ulcer with hemorrhage, without mention of obstruction (09/10/2009), ANEMIA (10/08/2009), CORONARY ARTERY DISEASE (01/26/2007), Dyspnea, History of kidney stones, HOH (hard of hearing), bladder cancer, HYPERLIPIDEMIA (01/26/2007), HYPERTENSION (01/26/2007), HYPOTHYROIDISM (10/18/2007), PROSTATE CANCER, HX OF (01/26/2007), SKIN CANCER, HX OF (01/26/2007), and Subdural hematoma ([place]) (2016).   Significant Hospital Events   8/1 Admit to  PCCM 8/2 Code status updated to DNR. EEG concerning for epileptogenicity, diffuse encephalopathy without sedation  8/3 marginally rising Cr. Significant tachycardia and tachypnea to stimulation -- starting on low dose propofol   Consults:    Procedures:  8/1 ETT  Significant Diagnostic Tests:  8/1 CT head>>No acute intracranial abnormality.  Stable areas of encephalomalacia in the right frontal lobe and anterior right temporal pole as well as sequela of prior lacunar infarcts in the bilateral basal ganglia.  8/1 CXR>>Mild diffuse increased interstitial markings throughout both lungs which could be due to interstitial edema and/or infectious etiology.  8/2 EEG> generalized epileptogenicity, diffuse encephalopathy concerning for anoxic brain injury.   8/2 ECHO> LVEF 62-83% Grade I diastolic dysfunction. Mildly reduced RV systolic function. S/p Aortic valve relacement   Micro Data:  8/1 MRSA screen>>neg 8/1 urine culture> 70000 staph aureus, 90000 enterobacter 8/1 blood cultures x2>>ngtd  8/1 Covid-19>> negative  Antimicrobials:  Cefepime 8/1-8/2 Zosyn 8/2>> 8/9 (planned) Vanc 8/3>>  Interim history/subjective:  NAEO Remains on propofol  Increased leukocytosis to 17 today  Objective   Blood pressure 116/65, pulse 91, temperature 98.4 F (36.9 C), temperature source Esophageal, resp. rate 20, height 5\' 8"  (1.727 m), weight 59.1 kg, SpO2 97 %.    Vent Mode: PRVC FiO2 (%):  [30 %] 30 % Set Rate:  [20 bmp] 20 bmp Vt Set:  [550 mL] 550 mL PEEP:  [5 cmH20] 5 cmH20 Pressure Support:  [5 cmH20] 5 cmH20 Plateau Pressure:  [25 cmH20-36 cmH20] 36 cmH20  Intake/Output Summary (Last 24 hours) at 03/27/2020 0758 Last data filed at 03/27/2020 0546 Gross per 24 hour  Intake 2812.15 ml  Output 800 ml  Net 2012.15 ml   Filed Weights   03/25/20 0200 03/26/20 0500 03/27/20 0530  Weight: 55.7 kg 56.4 kg 59.1 kg   GEN: Frail elderly critically ill appearing M sedated and intubated NAD   HEENT: Temporal muscle wasting. ETT OGT secure  CV: RRR s1s2 no rgm  PULM: Symmetrical chest expansion. Even unlabored respirations on PSV/CPAP, CTA  GI: Thin flat soft  EXT: Symmetrical muscle wasting. No obvious large joint deformity. No cyanosis  NEURO: Opens eyes. Localizes to pain. Equal round sluggish 80mm pupils. + cough + gag initiating breaths on vent  PSYCH: Unable to assess  SKIN: Thin dry skin with +turgor. Scattered psoriatic appearing skin plaques. Scattered ecchymosis. Clean, dry, warm   Resolved Hospital Problem list     Assessment & Plan:   S/p witnessed OOH PEA Arrest With at least 3-5 minutes downtime before EMS arrival, 10 minutes ACLS with epi x1 because is currently unclear, CT head is negative, no major metabolic disturbances.  He lives with wife who has dementia, so history is not reliable Acute encephalopathy Suspected anoxic injury in setting of cardiac arrest  -EEG concerning for possible anoxic injury, with generalized epileptogenicity and diffuse encephalopathy  P -s/p 36 ttm, now avoid fevers  -DNR -Re-address goals of care when son is back in [place] later this week. Per son, pt would not find QOL acceptable in LTACH or SNF, greatly values independence   -continue all indicated medical interventions in interim   Acute respiratory failure in setting of cardiac arrest  -VAP -PAD with prop -qAM WUA/SBT   UTI with staph aureus and enterobacter cloacae  -indwelling foley P - Vanc, Zosyn   Acute on chronic anemia Hx Fe def anemia, Hx probably upper GIB but pt has previosuly declined GI eval. Home emds: ASA plavix  Baseline hgn appears between 7 and 8. Received 2 PRBC for hgb 6.4, subsequent Hgb have been stable P -trend H/H -holding plavix   History of CAD s/p CABG x3, NSVT, severe AS s/p TAVR 04/2019, HFpEF  - Plavix on hold - ASA  AKI on CKD stage III -trend renal indices  -UOP  History of prostate cancer bladder cancer s/p TURBT -hold  Myrbetriq  Hypothyroidism -synthroid   Inadequate PO intake Severe malnutrition  -EN per RDN  -FWF   Best practice:  Diet: EN Pain/Anxiety/Delirium protocol (if indicated): Propofol, fentanyl  VAP protocol (if indicated): HOB 30 degrees, suction as needed DVT prophylaxis: SCDs GI prophylaxis: PPI Glucose control: SSI Mobility: Bedrest Code Status: DNR Family Communication: pending 8/4 Disposition: ICU    CRITICAL CARE Performed by: Cristal Generous   Total critical care time: 40 minutes  Critical care time was exclusive of separately billable procedures and treating other patients. Critical care was necessary to treat or prevent imminent or life-threatening deterioration.  Critical care was time spent personally by me on the following activities: development of treatment plan with patient and/or surrogate as well as nursing, discussions with consultants, evaluation of patient's response to treatment, examination of patient, obtaining history from patient or surrogate, ordering and performing treatments and interventions, ordering and review of laboratory studies, ordering and review of radiographic studies, pulse oximetry and re-evaluation of patient's condition.  Eliseo Gum MSN, AGACNP-BC [place] [phone] If no answer, [phone] 03/27/2020, 7:58 AM

## 2020-03-28 LAB — BASIC METABOLIC PANEL
Anion gap: 10 (ref 5–15)
BUN: 33 mg/dL — ABNORMAL HIGH (ref 8–23)
CO2: 26 mmol/L (ref 22–32)
Calcium: 8.2 mg/dL — ABNORMAL LOW (ref 8.9–10.3)
Chloride: 107 mmol/L (ref 98–111)
Creatinine, Ser: 1.38 mg/dL — ABNORMAL HIGH (ref 0.61–1.24)
GFR calc Af Amer: 51 mL/min — ABNORMAL LOW (ref >60)
GFR calc non Af Amer: 44 mL/min — ABNORMAL LOW (ref >60)
Glucose, Bld: 104 mg/dL — ABNORMAL HIGH (ref 70–99)
Potassium: 4 mmol/L (ref 3.5–5.1)
Sodium: 143 mmol/L (ref 135–145)

## 2020-03-28 LAB — CBC
HCT: 29 % — ABNORMAL LOW (ref 39.0–52.0)
Hemoglobin: 8.7 g/dL — ABNORMAL LOW (ref 13.0–17.0)
MCH: 26.5 pg (ref 26.0–34.0)
MCHC: 30 g/dL (ref 30.0–36.0)
MCV: 88.4 fL (ref 80.0–100.0)
Platelets: 154 10*3/uL (ref 150–400)
RBC: 3.28 MIL/uL — ABNORMAL LOW (ref 4.22–5.81)
RDW: 17.9 % — ABNORMAL HIGH (ref 11.5–15.5)
WBC: 8.5 10*3/uL (ref 4.0–10.5)
nRBC: 0.2 % (ref 0.0–0.2)

## 2020-03-28 LAB — PHOSPHORUS: Phosphorus: 2.5 mg/dL (ref 2.5–4.6)

## 2020-03-28 LAB — MAGNESIUM: Magnesium: 2.2 mg/dL (ref 1.7–2.4)

## 2020-03-28 LAB — GLUCOSE, CAPILLARY
Glucose-Capillary: 60 mg/dL — ABNORMAL LOW (ref 70–99)
Glucose-Capillary: 101 mg/dL — ABNORMAL HIGH (ref 70–99)
Glucose-Capillary: 73 mg/dL (ref 70–99)
Glucose-Capillary: 103 mg/dL — ABNORMAL HIGH (ref 70–99)
Glucose-Capillary: 123 mg/dL — ABNORMAL HIGH (ref 70–99)
Glucose-Capillary: 163 mg/dL — ABNORMAL HIGH (ref 70–99)

## 2020-03-28 LAB — TRIGLYCERIDES: Triglycerides: 92 mg/dL (ref <150)

## 2020-03-28 MED ORDER — DEXTROSE 50 % IV SOLN
12.5000 g | INTRAVENOUS | Status: AC
  Administered 2020-03-28: 12.5 g via INTRAVENOUS
  Filled 2020-03-28: qty 50

## 2020-03-28 MED ORDER — FENTANYL CITRATE (PF) 100 MCG/2ML IJ SOLN: 25.0000 ug | INTRAMUSCULAR | Status: DC | PRN

## 2020-03-28 MED ORDER — ASPIRIN 81 MG PO CHEW
81.0000 mg | CHEWABLE_TABLET | Freq: Every day | ORAL | Status: DC
  Administered 2020-03-28 – 2020-03-31 (×4): 81 mg
  Filled 2020-03-28 (×4): qty 1

## 2020-03-28 MED ORDER — PROPOFOL 1000 MG/100ML IV EMUL
5.0000 ug/kg/min | INTRAVENOUS | Status: DC
  Administered 2020-03-29: 25 ug/kg/min via INTRAVENOUS
  Administered 2020-03-29: 10 ug/kg/min via INTRAVENOUS
  Administered 2020-03-30: 18.282 ug/kg/min via INTRAVENOUS
  Administered 2020-03-30: 20 ug/kg/min via INTRAVENOUS
  Administered 2020-03-31 (×2): 15 ug/kg/min via INTRAVENOUS
  Filled 2020-03-28 (×5): qty 100

## 2020-03-28 MED ORDER — DEXMEDETOMIDINE HCL IN NACL 400 MCG/100ML IV SOLN
0.0000 ug/kg/h | INTRAVENOUS | Status: DC
  Administered 2020-03-28: 0.4 ug/kg/h via INTRAVENOUS
  Filled 2020-03-28: qty 100

## 2020-03-28 MED ORDER — POLYETHYLENE GLYCOL 3350 17 G PO PACK
17.0000 g | PACK | Freq: Every day | ORAL | Status: DC
  Filled 2020-03-28 (×2): qty 1

## 2020-03-28 MED ORDER — DOCUSATE SODIUM 50 MG/5ML PO LIQD
100.0000 mg | Freq: Two times a day (BID) | ORAL | Status: DC
  Administered 2020-03-28 – 2020-03-31 (×2): 100 mg
  Filled 2020-03-28 (×2): qty 10

## 2020-03-28 NOTE — Progress Notes (Addendum)
NAME:  Raymond Swanson, MRN:  [id number], DOB:  11/08/1924, LOS: 4 ADMISSION DATE:  03/26/2020, CONSULTATION DATE:  03/28/20 REFERRING MD:  EDP, CHIEF COMPLAINT:  Cardiac Arrest   Brief History   86 year old male with past medical history significant for CAD, TAVR, GI bleed who lives at home and was reportedly in his usual state of health when he had a witnessed collapse.  On EMS arrival patient was in PEA arrest, unclear downtime prior to this.  10 minutes ACLS and epi x1 with ROSC.  Patient intubated and PCCM consulted for admission.  History of present illness   Raymond Swanson is a 86 year old male with past medical history of CAD status post CABG x3, TAVR, HFpEF, prostate and bladder cancer who lives at home and reportedly had a collapsed in the bathroom.  Wife has dementia and so history somewhat unclear.  Patient had somewhere around 3 to 5 minutes of downtime prior to EMS arrival, he was found in PEA arrest.  ACLS performed for 10 minutes with epi x1 before ROSC.  He was intubated and transported to the ED.  In the ED, patient was hemodynamically stable and CT head was negative.  EKG without signs of STEMI.  Labs showed no severe metabolic derangements, lactic acid 5.2, anemic with hemoglobin of 6.4.  Chest x-ray with likely mild edema.  Urinalysis positive for UTI.  He was given 2 units PRBCs, has a history of GI bleed in the past so GI was consulted and he was started on Protonix.  PCCM consulted for admission  Past Medical History   has a past medical history of Acute gastric ulcer with hemorrhage, without mention of obstruction (09/10/2009), ANEMIA (10/08/2009), CORONARY ARTERY DISEASE (01/26/2007), Dyspnea, History of kidney stones, HOH (hard of hearing), bladder cancer, HYPERLIPIDEMIA (01/26/2007), HYPERTENSION (01/26/2007), HYPOTHYROIDISM (10/18/2007), PROSTATE CANCER, HX OF (01/26/2007), SKIN CANCER, HX OF (01/26/2007), and Subdural hematoma ([place]) (2016).   Significant Hospital Events   8/1 Admit to  PCCM 8/2 Code status updated to DNR. EEG concerning for epileptogenicity, diffuse encephalopathy without sedation  8/3 marginally rising Cr. Significant tachycardia and tachypnea to stimulation -- starting on low dose propofol   Consults:    Procedures:  8/1 ETT  Significant Diagnostic Tests:  8/1 CT head>> No acute intracranial abnormality.  Stable areas of encephalomalacia in the right frontal lobe and anterior right temporal pole as well as sequela of prior lacunar infarcts in the bilateral basal ganglia.  8/1 CXR>> Mild diffuse increased interstitial markings throughout both lungs which could be due to interstitial edema and/or infectious etiology.  8/2 EEG> generalized epileptogenicity, diffuse encephalopathy concerning for anoxic brain injury.   8/2 ECHO> LVEF 25-05% Grade I diastolic dysfunction. Mildly reduced RV systolic function. S/p Aortic valve relacement   Micro Data:  8/1 MRSA screen>>neg 8/1 urine culture> 70000 staph aureus, 90000 enterobacter 8/1 blood cultures x2>>ngtd  8/1 Covid-19>> negative  Antimicrobials:  Cefepime 8/1-8/2 Zosyn 8/2>> 8/9 (planned) Vanc 8/3>>   Interim history/subjective:  Lying in bed on mechanical ventilation with propofol drip infusing Leukocytosis improved No acute events overnight  Objective   Blood pressure (!) 93/44, pulse 81, temperature 98.8 F (37.1 C), temperature source Esophageal, resp. rate 17, height 5\' 8"  (1.727 m), weight 61.7 kg, SpO2 98 %.    Vent Mode: PRVC FiO2 (%):  [30 %] 30 % Set Rate:  [20 bmp] 20 bmp Vt Set:  [550 mL] 550 mL PEEP:  [5 cmH20] 5 cmH20 Pressure Support:  [5 cmH20-8 cmH20] 8  cmH20 Plateau Pressure:  [10 cmH20-17 cmH20] 17 cmH20   Intake/Output Summary (Last 24 hours) at 03/28/2020 0741 Last data filed at 03/28/2020 0602 Gross per 24 hour  Intake 2447.52 ml  Output 750 ml  Net 1697.52 ml   Filed Weights   03/26/20 0500 03/27/20 0530 03/28/20 0518  Weight: 56.4 kg 59.1 kg 61.7 kg    Physical exam: General: Chronically deconditioned elderly male lying in bed on mechanical ventilation in no acute distress HEENT: ETT, MM pink/moist, PERRL, sclera nonicteric Neuro: Sedated and unresponsive on ventilator CV: s1s2 regular rate and rhythm, no murmur, rubs, or gallops,  PULM: Mechanical breath sounds bilaterally, no increased work of breathing, no added breath sounds, tolerating SBT well GI: soft, bowel sounds active in all 4 quadrants, non-tender, non-distended, tolerating TF Extremities: warm/dry, no edema, generalized ecchymosis Skin: no rashes or lesions  Resolved Hospital Problem list     Assessment & Plan:   S/p witnessed OOH PEA Arrest -With at least 3-5 minutes downtime before EMS arrival, 10 minutes ACLS with epi x1. Cause is currently unclear, CT head is negative, no major metabolic disturbances.  He lives with wife who has dementia, so history is not reliable -S/P 36 TTM Acute encephalopathy Suspected anoxic injury in setting of cardiac arrest  -EEG concerning for possible anoxic injury, with generalized epileptogenicity and diffuse encephalopathy  P: Continue discussion with son regarding GOC Supportive care Continuous telemetry  Wean sedation as able  Acute respiratory failure in setting of cardiac arrest  P: Continue ventilator support with lung protective strategies  Currently tolerating SBT well but mentation precludes extubation Wean PEEP and FiO2 for sats greater than 90%. Head of bed elevated 30 degrees. Plateau pressures less than 30 cm H20.  Follow intermittent chest x-ray and ABG.   Ensure adequate pulmonary hygiene  Follow cultures  VAP bundle in place  PAD protocol  UTI with staph aureus and enterobacter cloacae  -indwelling foley P: Continue IV antibiotics  Blood cultures remains NTD Leukocytosis resolved by a.m. of 8/5  Acute on chronic anemia -Hx Fe def anemia, Hx probably upper GIB but pt has previosuly declined GI eval.  -Home emds: ASA plavix  -Baseline hgn appears between 7 and 8. Received 2 PRBC for hgb 6.4, subsequent Hgb have been stable P: Transfuse per protocol Hgb goal greater than 7 Plavix remains on hold    History of CAD s/p CABG x3, NSVT, severe AS s/p TAVR 04/2019, HFpEF  P: Supportive care  Plavix on hold as above  Continue ASA  AKI on CKD stage III, improving  -in the setting of cardiac arrest with UTI. Creatinine 1.18 on 7/26 was 1.18, creatinine during admission peaked at 1.62 P: Follow renal function / urine output Trend Bmet Avoid nephrotoxins, ensure adequate renal perfusion   History of prostate cancer bladder cancer s/p TURBT P: Home Myrbetriq on hold  Hypothyroidism P: Continue home synthroid   Inadequate PO intake Severe malnutrition  P: Enteral nutrition per registered dietitian  Continue free water flushes  protein supplementation   Best practice:  Diet: EN Pain/Anxiety/Delirium protocol (if indicated): Propofol, fentanyl  VAP protocol (if indicated): HOB 30 degrees, suction as needed DVT prophylaxis: SCDs GI prophylaxis: PPI Glucose control: SSI Mobility: Bedrest Code Status: DNR Family Communication: Spoke to [place] over the phone morning of 8/5 he requested all updates be done thought his niece as he is currently out of state  Disposition: ICU  CRITICAL CARE Performed by: Johnsie Cancel  Total  critical care time: 38 minutes  Critical care time was exclusive of separately billable procedures and treating other patients. Critical care was necessary to treat or prevent imminent or life-threatening deterioration.  Critical care was time spent personally by me on the following activities: development of treatment plan with patient and/or surrogate as well as nursing, discussions with consultants, evaluation of patient's response to treatment, examination of patient, obtaining history from patient or surrogate, ordering and performing treatments  and interventions, ordering and review of laboratory studies, ordering and review of radiographic studies, pulse oximetry and re-evaluation of patient's condition.  Johnsie Cancel, NP-C [facility] Contact / Pager information can be found on Amion  03/28/2020, 7:57 AM

## 2020-03-29 LAB — BASIC METABOLIC PANEL
Anion gap: 9 (ref 5–15)
BUN: 27 mg/dL — ABNORMAL HIGH (ref 8–23)
CO2: 25 mmol/L (ref 22–32)
Calcium: 8.1 mg/dL — ABNORMAL LOW (ref 8.9–10.3)
Chloride: 106 mmol/L (ref 98–111)
Creatinine, Ser: 1.01 mg/dL (ref 0.61–1.24)
GFR calc Af Amer: >60 mL/min (ref >60)
GFR calc non Af Amer: >60 mL/min (ref >60)
Glucose, Bld: 152 mg/dL — ABNORMAL HIGH (ref 70–99)
Potassium: 4.2 mmol/L (ref 3.5–5.1)
Sodium: 140 mmol/L (ref 135–145)

## 2020-03-29 LAB — CULTURE, BLOOD (ROUTINE X 2)
Culture: NO GROWTH
Culture: NO GROWTH
Special Requests: ADEQUATE

## 2020-03-29 LAB — CBC
HCT: 31.5 % — ABNORMAL LOW (ref 39.0–52.0)
Hemoglobin: 9.1 g/dL — ABNORMAL LOW (ref 13.0–17.0)
MCH: 25.7 pg — ABNORMAL LOW (ref 26.0–34.0)
MCHC: 28.9 g/dL — ABNORMAL LOW (ref 30.0–36.0)
MCV: 89 fL (ref 80.0–100.0)
Platelets: 195 10*3/uL (ref 150–400)
RBC: 3.54 MIL/uL — ABNORMAL LOW (ref 4.22–5.81)
RDW: 18.7 % — ABNORMAL HIGH (ref 11.5–15.5)
WBC: 9.4 10*3/uL (ref 4.0–10.5)
nRBC: 0 % (ref 0.0–0.2)

## 2020-03-29 LAB — GLUCOSE, CAPILLARY
Glucose-Capillary: 123 mg/dL — ABNORMAL HIGH (ref 70–99)
Glucose-Capillary: 130 mg/dL — ABNORMAL HIGH (ref 70–99)
Glucose-Capillary: 134 mg/dL — ABNORMAL HIGH (ref 70–99)
Glucose-Capillary: 134 mg/dL — ABNORMAL HIGH (ref 70–99)
Glucose-Capillary: 144 mg/dL — ABNORMAL HIGH (ref 70–99)
Glucose-Capillary: 149 mg/dL — ABNORMAL HIGH (ref 70–99)
Glucose-Capillary: 168 mg/dL — ABNORMAL HIGH (ref 70–99)

## 2020-03-29 LAB — MAGNESIUM: Magnesium: 2.2 mg/dL (ref 1.7–2.4)

## 2020-03-29 LAB — TRIGLYCERIDES: Triglycerides: 77 mg/dL (ref <150)

## 2020-03-29 LAB — PHOSPHORUS: Phosphorus: 2.9 mg/dL (ref 2.5–4.6)

## 2020-03-29 MED ORDER — FENTANYL 2500MCG IN NS 250ML (10MCG/ML) PREMIX INFUSION
25.0000 ug/h | INTRAVENOUS | Status: DC
  Administered 2020-03-29: 25 ug/h via INTRAVENOUS
  Filled 2020-03-29: qty 250

## 2020-03-29 NOTE — Procedures (Signed)
Bronchoscopy  Indication: Unable to ventilate  Consent: Emergent  Anesthesia: Already going for mechanical sedation  Procedure - Timeout performed - Bronchoscope advanced through ETT - Airways examined down to subsegmental level  Findings - ETT in good position - Chronic bronchitis changes - No significant mucus plugs  Specimen(s): none  Complications: none  Given findings and vent waveforms, increased sedation and switched him to SIMV to reduce vent dys-synchrony  Erskine Emery MD PCCM

## 2020-03-29 NOTE — Progress Notes (Signed)
NAME:  Raymond Swanson, MRN:  [id number], DOB:  1925-06-15, LOS: 5 ADMISSION DATE:  04/22/2020, CONSULTATION DATE:  03/29/20 REFERRING MD:  EDP, CHIEF COMPLAINT:  Cardiac Arrest   Brief History   86 year old male with past medical history significant for CAD, TAVR, GI bleed who lives at home and was reportedly in his usual state of health when he had a witnessed collapse.  On EMS arrival patient was in PEA arrest, unclear downtime prior to this.  10 minutes ACLS and epi x1 with ROSC.  Patient intubated and PCCM consulted for admission.  History of present illness   Raymond Swanson is a 86 year old male with past medical history of CAD status post CABG x3, TAVR, HFpEF, prostate and bladder cancer who lives at home and reportedly had a collapsed in the bathroom.  Wife has dementia and so history somewhat unclear.  Patient had somewhere around 3 to 5 minutes of downtime prior to EMS arrival, he was found in PEA arrest.  ACLS performed for 10 minutes with epi x1 before ROSC.  He was intubated and transported to the ED.  In the ED, patient was hemodynamically stable and CT head was negative.  EKG without signs of STEMI.  Labs showed no severe metabolic derangements, lactic acid 5.2, anemic with hemoglobin of 6.4.  Chest x-ray with likely mild edema.  Urinalysis positive for UTI.  He was given 2 units PRBCs, has a history of GI bleed in the past so GI was consulted and he was started on Protonix.  PCCM consulted for admission  Past Medical History   has a past medical history of Acute gastric ulcer with hemorrhage, without mention of obstruction (09/10/2009), ANEMIA (10/08/2009), CORONARY ARTERY DISEASE (01/26/2007), Dyspnea, History of kidney stones, HOH (hard of hearing), bladder cancer, HYPERLIPIDEMIA (01/26/2007), HYPERTENSION (01/26/2007), HYPOTHYROIDISM (10/18/2007), PROSTATE CANCER, HX OF (01/26/2007), SKIN CANCER, HX OF (01/26/2007), and Subdural hematoma ([place]) (2016).   Significant Hospital Events   8/1 Admit to  PCCM 8/2 Code status updated to DNR. EEG concerning for epileptogenicity, diffuse encephalopathy without sedation  8/3 marginally rising Cr. Significant tachycardia and tachypnea to stimulation -- starting on low dose propofol   Consults:    Procedures:  8/1 ETT  Significant Diagnostic Tests:  8/1 CT head>> No acute intracranial abnormality.  Stable areas of encephalomalacia in the right frontal lobe and anterior right temporal pole as well as sequela of prior lacunar infarcts in the bilateral basal ganglia.  8/1 CXR>> Mild diffuse increased interstitial markings throughout both lungs which could be due to interstitial edema and/or infectious etiology.  8/2 EEG> generalized epileptogenicity, diffuse encephalopathy concerning for anoxic brain injury.   8/2 ECHO> LVEF 06-23% Grade I diastolic dysfunction. Mildly reduced RV systolic function. S/p Aortic valve relacement   Micro Data:  8/1 MRSA screen>>neg 8/1 urine culture> 70000 staph aureus, 90000 enterobacter 8/1 blood cultures x2>>ngtd  8/1 Covid-19>> negative  Antimicrobials:  Cefepime 8/1-8/2 Zosyn 8/2>> 8/9 (planned) Vanc 8/3>>   Interim history/subjective:  Having some issues with vent synchrony this AM.  Bronch'd emergently for inability to ventilate.  Still awaiting family.  Remains poorly responsive.  Objective   Blood pressure 118/64, pulse 87, temperature 98.9 F (37.2 C), temperature source Oral, resp. rate (!) 21, height 5\' 8"  (1.727 m), weight 64.6 kg, SpO2 99 %.    Vent Mode: SIMV FiO2 (%):  [30 %] 30 % Set Rate:  [5 bmp-20 bmp] 5 bmp Vt Set:  [500 mL-550 mL] 500 mL PEEP:  [5 cmH20]  5 cmH20 Pressure Support:  [10 cmH20] [place] Pressure:  [11 ORV61-53 cmH20] 13 cmH20   Intake/Output Summary (Last 24 hours) at 03/29/2020 7943 Last data filed at 03/29/2020 0800 Gross per 24 hour  Intake 2780.29 ml  Output 1350 ml  Net 1430.29 ml   Filed Weights   03/27/20 0530 03/28/20 0518 03/29/20 0500  Weight:  59.1 kg 61.7 kg 64.6 kg   Physical exam:  Gen: frail elderly man on vent CV: RRR, ext warm PULM: fighting vent, some thick secretions in ETT, +accessory muscle use GI: soft, +BS Extremities: warm/dry, no edema, generalized ecchymosis Skin: multiple areas of skin breakdown and psoriatic type lesions, bruising Neuro: Pupils reactive No blink reaction to threat +corneals +dolls GCS 3 + cough/gag and vent triggering  BMP pending CBC stable  Resolved Hospital Problem list     Assessment & Plan:   S/p witnessed OOH PEA Arrest Anoxic brain injury- confirmed on imaging Respiratory failure due to cardiac arrest UTI with staph aureus and enterobacter cloacae  History of CAD s/p CABG x3, NSVT, severe AS s/p TAVR 04/2019, HFpEF  AKI on CKD stage III, improving  Severe protein calorie malnutrition present on admission Frail elderly  - Continue supportive care - Awaiting family arrival - Approaching medical futility with continued aggressive care  Best practice:  Diet: EN Pain/Anxiety/Delirium protocol (if indicated): Propofol, fentanyl for appearance of comfort VAP protocol (if indicated): HOB 30 degrees, suction as needed DVT prophylaxis: SCDs GI prophylaxis: PPI Glucose control: SSI Mobility: Bedrest Code Status: DNR Family Communication: called grand-daughter, Raymond Swanson should be in town today, relayed the current findings and recommendation for comfort care with everyone coming to say goodbye, final decision to be made when Raymond Swanson comes in Disposition: ICU  34 min cc time Erskine Emery MD PCCM

## 2020-03-30 LAB — CBC
HCT: 28.3 % — ABNORMAL LOW (ref 39.0–52.0)
Hemoglobin: 8.2 g/dL — ABNORMAL LOW (ref 13.0–17.0)
MCH: 26.2 pg (ref 26.0–34.0)
MCHC: 29 g/dL — ABNORMAL LOW (ref 30.0–36.0)
MCV: 90.4 fL (ref 80.0–100.0)
Platelets: 167 10*3/uL (ref 150–400)
RBC: 3.13 MIL/uL — ABNORMAL LOW (ref 4.22–5.81)
RDW: 18.6 % — ABNORMAL HIGH (ref 11.5–15.5)
WBC: 9.3 10*3/uL (ref 4.0–10.5)
nRBC: 0 % (ref 0.0–0.2)

## 2020-03-30 LAB — BASIC METABOLIC PANEL
Anion gap: 9 (ref 5–15)
BUN: 29 mg/dL — ABNORMAL HIGH (ref 8–23)
CO2: 25 mmol/L (ref 22–32)
Calcium: 8 mg/dL — ABNORMAL LOW (ref 8.9–10.3)
Chloride: 107 mmol/L (ref 98–111)
Creatinine, Ser: 1.01 mg/dL (ref 0.61–1.24)
GFR calc Af Amer: >60 mL/min (ref >60)
GFR calc non Af Amer: >60 mL/min (ref >60)
Glucose, Bld: 127 mg/dL — ABNORMAL HIGH (ref 70–99)
Potassium: 5 mmol/L (ref 3.5–5.1)
Sodium: 141 mmol/L (ref 135–145)

## 2020-03-30 LAB — PHOSPHORUS: Phosphorus: 3.2 mg/dL (ref 2.5–4.6)

## 2020-03-30 LAB — GLUCOSE, CAPILLARY
Glucose-Capillary: 101 mg/dL — ABNORMAL HIGH (ref 70–99)
Glucose-Capillary: 110 mg/dL — ABNORMAL HIGH (ref 70–99)
Glucose-Capillary: 124 mg/dL — ABNORMAL HIGH (ref 70–99)
Glucose-Capillary: 133 mg/dL — ABNORMAL HIGH (ref 70–99)
Glucose-Capillary: 141 mg/dL — ABNORMAL HIGH (ref 70–99)
Glucose-Capillary: 154 mg/dL — ABNORMAL HIGH (ref 70–99)

## 2020-03-30 LAB — MAGNESIUM: Magnesium: 2.1 mg/dL (ref 1.7–2.4)

## 2020-03-30 NOTE — Progress Notes (Signed)
NAME:  SEVON ROTERT, MRN:  [id number], DOB:  05-May-1925, LOS: 6 ADMISSION DATE:  04/12/2020, CONSULTATION DATE:  03/30/20 REFERRING MD:  EDP, CHIEF COMPLAINT:  Cardiac Arrest   Brief History   86 year old male with past medical history significant for CAD, TAVR, GI bleed who lives at home and was reportedly in his usual state of health when he had a witnessed collapse.  On EMS arrival patient was in PEA arrest, unclear downtime prior to this.  10 minutes ACLS and epi x1 with ROSC.  Patient intubated and PCCM consulted for admission.  History of present illness   [name] is a 86 year old male with past medical history of CAD status post CABG x3, TAVR, HFpEF, prostate and bladder cancer who lives at home and reportedly had a collapsed in the bathroom.  Wife has dementia and so history somewhat unclear.  Patient had somewhere around 3 to 5 minutes of downtime prior to EMS arrival, he was found in PEA arrest.  ACLS performed for 10 minutes with epi x1 before ROSC.  He was intubated and transported to the ED.  In the ED, patient was hemodynamically stable and CT head was negative.  EKG without signs of STEMI.  Labs showed no severe metabolic derangements, lactic acid 5.2, anemic with hemoglobin of 6.4.  Chest x-ray with likely mild edema.  Urinalysis positive for UTI.  He was given 2 units PRBCs, has a history of GI bleed in the past so GI was consulted and he was started on Protonix.  PCCM consulted for admission  Past Medical History   has a past medical history of Acute gastric ulcer with hemorrhage, without mention of obstruction (09/10/2009), ANEMIA (10/08/2009), CORONARY ARTERY DISEASE (01/26/2007), Dyspnea, History of kidney stones, HOH (hard of hearing), bladder cancer, HYPERLIPIDEMIA (01/26/2007), HYPERTENSION (01/26/2007), HYPOTHYROIDISM (10/18/2007), PROSTATE CANCER, HX OF (01/26/2007), SKIN CANCER, HX OF (01/26/2007), and Subdural hematoma ([place]) (2016).   Significant Hospital Events   8/1 Admit to  PCCM 8/2 Code status updated to DNR. EEG concerning for epileptogenicity, diffuse encephalopathy without sedation  8/3 marginally rising Cr. Significant tachycardia and tachypnea to stimulation -- starting on low dose propofol   Consults:    Procedures:  8/1 ETT  Significant Diagnostic Tests:  8/1 CT head>> No acute intracranial abnormality.  Stable areas of encephalomalacia in the right frontal lobe and anterior right temporal pole as well as sequela of prior lacunar infarcts in the bilateral basal ganglia.  8/1 CXR>> Mild diffuse increased interstitial markings throughout both lungs which could be due to interstitial edema and/or infectious etiology.  8/2 EEG> generalized epileptogenicity, diffuse encephalopathy concerning for anoxic brain injury.   8/2 ECHO> LVEF 40-98% Grade I diastolic dysfunction. Mildly reduced RV systolic function. S/p Aortic valve relacement   Micro Data:  See micro tab  Antimicrobials:  See fever tab  Interim history/subjective:  No events, remains poorly responsive on vent.  Objective   Blood pressure (!) 100/52, pulse 71, temperature 98.6 F (37 C), resp. rate 15, height 5\' 8"  (1.727 m), weight 65 kg, SpO2 100 %.    Vent Mode: SIMV;PSV FiO2 (%):  [30 %] 30 % Set Rate:  [5 bmp] 5 bmp Vt Set:  [500 mL] 500 mL PEEP:  [5 cmH20] 5 cmH20 Pressure Support:  [12 cmH20] 12 cmH20 Plateau Pressure:  [14 cmH20] 14 cmH20   Intake/Output Summary (Last 24 hours) at 03/30/2020 1015 Last data filed at 03/30/2020 0700 Gross per 24 hour  Intake 1993.36 ml  Output 1110  ml  Net 883.36 ml   Filed Weights   03/28/20 0518 03/29/20 0500 03/30/20 0422  Weight: 61.7 kg 64.6 kg 65 kg   Physical exam:  Gen: frail elderly man on vent CV: RRR, ext warm PULM: less secretions today, lungs with rhonci GI: soft, +BS Extremities: warm/dry, no edema, generalized ecchymosis Skin: multiple areas of skin breakdown and psoriatic type lesions, bruising Neuro: Pupils  reactive No blink reaction to threat +corneals +dolls GCS 3 + cough/gag and vent triggering  CBC and BMP stable  Resolved Hospital Problem list     Assessment & Plan:   S/p witnessed OOH PEA Arrest Anoxic brain injury- confirmed on imaging Respiratory failure due to cardiac arrest UTI with staph aureus and enterobacter cloacae  History of CAD s/p CABG x3, NSVT, severe AS s/p TAVR 04/2019, HFpEF  AKI on CKD stage III, improving  Severe protein calorie malnutrition present on admission Frail elderly  - Continue supportive care - 10 days zosyn - Awaiting son's arrival - Approaching medical futility with continued aggressive care  Best practice:  Diet: EN Pain/Anxiety/Delirium protocol (if indicated): Propofol, fentanyl for appearance of comfort VAP protocol (if indicated): HOB 30 degrees, suction as needed DVT prophylaxis: SCDs GI prophylaxis: PPI Glucose control: SSI Mobility: Bedrest Code Status: DNR Family Communication: RN to reach out to son to get him to come in hopefully Disposition: ICU  35 min cc time Erskine Emery MD PCCM

## 2020-03-31 LAB — GLUCOSE, CAPILLARY
Glucose-Capillary: 115 mg/dL — ABNORMAL HIGH (ref 70–99)
Glucose-Capillary: 105 mg/dL — ABNORMAL HIGH (ref 70–99)
Glucose-Capillary: 104 mg/dL — ABNORMAL HIGH (ref 70–99)

## 2020-03-31 MED ORDER — ACETAMINOPHEN 650 MG RE SUPP: 650.0000 mg | Freq: Four times a day (QID) | RECTAL | Status: DC | PRN

## 2020-03-31 MED ORDER — GLYCOPYRROLATE 0.2 MG/ML IJ SOLN: 0.2000 mg | INTRAMUSCULAR | Status: DC | PRN

## 2020-03-31 MED ORDER — MORPHINE 100MG IN NS 100ML (1MG/ML) PREMIX INFUSION: 0.0000 mg/h | INTRAVENOUS | Status: DC

## 2020-03-31 MED ORDER — IPRATROPIUM-ALBUTEROL 0.5-2.5 (3) MG/3ML IN SOLN: 3.0000 mL | Freq: Four times a day (QID) | RESPIRATORY_TRACT | Status: DC | PRN

## 2020-03-31 MED ORDER — MORPHINE SULFATE (PF) 2 MG/ML IV SOLN
2.0000 mg | INTRAVENOUS | Status: AC | PRN
  Administered 2020-03-31 (×3): 2 mg via INTRAVENOUS
  Filled 2020-03-31 (×3): qty 1

## 2020-03-31 MED ORDER — POLYVINYL ALCOHOL 1.4 % OP SOLN
1.0000 [drp] | Freq: Four times a day (QID) | OPHTHALMIC | Status: DC | PRN
  Filled 2020-03-31: qty 15

## 2020-03-31 MED ORDER — GLYCOPYRROLATE 1 MG PO TABS
1.0000 mg | ORAL_TABLET | ORAL | Status: DC | PRN
  Filled 2020-03-31: qty 1

## 2020-03-31 MED ORDER — MORPHINE BOLUS VIA INFUSION
5.0000 mg | INTRAVENOUS | Status: DC | PRN
  Filled 2020-03-31: qty 5

## 2020-03-31 MED ORDER — ACETAMINOPHEN 325 MG PO TABS: 650.0000 mg | ORAL_TABLET | Freq: Four times a day (QID) | ORAL | Status: DC | PRN

## 2020-03-31 MED ORDER — LORAZEPAM 2 MG/ML IJ SOLN: 2.0000 mg | INTRAMUSCULAR | Status: DC | PRN

## 2020-03-31 MED ORDER — DEXTROSE 5 % IV SOLN: INTRAVENOUS | Status: DC

## 2020-03-31 MED ORDER — DIPHENHYDRAMINE HCL 50 MG/ML IJ SOLN: 25.0000 mg | INTRAMUSCULAR | Status: DC | PRN

## 2020-04-01 ENCOUNTER — Telehealth: Payer: Self-pay | Admitting: Internal Medicine

## 2020-04-01 ENCOUNTER — Encounter (INDEPENDENT_AMBULATORY_CARE_PROVIDER_SITE_OTHER): Payer: Medicare Other | Admitting: Ophthalmology

## 2020-04-03 ENCOUNTER — Ambulatory Visit: Payer: Medicare Other | Admitting: Internal Medicine

## 2020-04-24 NOTE — Procedures (Signed)
Extubation Procedure Note  Patient Details:   Name: Raymond Swanson DOB: 02/03/1925 MRN: [id number]   Airway Documentation:  Airway 7.5 mm (Active)  Secured at (cm) 25 cm 04/07/2020 2013  Measured From Lips 2020-04-07 2013  [place] 07-Apr-2020 2013  Secured By [employer] Apr 07, 2020 2013  Tube Holder Repositioned Yes 2020/04/07 1534  Cuff Pressure (cm H2O) 26 cm H2O 07-Apr-2020 2013  Site Condition Dry Apr 07, 2020 1900   Vent end date: (not recorded) Vent end time: (not recorded)   Evaluation  O2 sats: currently acceptable Complications: No apparent complications Patient did tolerate procedure well. Bilateral Breath Sounds: Clear   Patient terminally extubated per order.   Catha Brow 04-07-20, 8:21 PM

## 2020-04-24 NOTE — Progress Notes (Signed)
NAME:  Raymond Swanson, MRN:  [id number], DOB:  [birth year], LOS: 7 ADMISSION DATE:  04/19/2020, CONSULTATION DATE:  Apr 19, 2020 REFERRING MD:  EDP, CHIEF COMPLAINT:  Cardiac Arrest   Brief History   86 year old male with past medical history significant for CAD, TAVR, GI bleed who lives at home and was reportedly in his usual state of health when he had a witnessed collapse.  On EMS arrival patient was in PEA arrest, unclear downtime prior to this.  10 minutes ACLS and epi x1 with ROSC.  Patient intubated and PCCM consulted for admission.  History of present illness   Raymond Swanson is a 86 year old male with past medical history of CAD status post CABG x3, TAVR, HFpEF, prostate and bladder cancer who lives at home and reportedly had a collapsed in the bathroom.  Wife has dementia and so history somewhat unclear.  Patient had somewhere around 3 to 5 minutes of downtime prior to EMS arrival, he was found in PEA arrest.  ACLS performed for 10 minutes with epi x1 before ROSC.  He was intubated and transported to the ED.  In the ED, patient was hemodynamically stable and CT head was negative.  EKG without signs of STEMI.  Labs showed no severe metabolic derangements, lactic acid 5.2, anemic with hemoglobin of 6.4.  Chest x-ray with likely mild edema.  Urinalysis positive for UTI.  He was given 2 units PRBCs, has a history of GI bleed in the past so GI was consulted and he was started on Protonix.  PCCM consulted for admission  Past Medical History   has a past medical history of Acute gastric ulcer with hemorrhage, without mention of obstruction (09/10/2009), ANEMIA (10/08/2009), CORONARY ARTERY DISEASE (01/26/2007), Dyspnea, History of kidney stones, HOH (hard of hearing), bladder cancer, HYPERLIPIDEMIA (01/26/2007), HYPERTENSION (01/26/2007), HYPOTHYROIDISM (10/18/2007), PROSTATE CANCER, HX OF (01/26/2007), SKIN CANCER, HX OF (01/26/2007), and Subdural hematoma ([place]) (2016).   Significant Hospital Events   8/1 Admit to  PCCM 8/2 Code status updated to DNR. EEG concerning for epileptogenicity, diffuse encephalopathy without sedation  8/3 marginally rising Cr. Significant tachycardia and tachypnea to stimulation -- starting on low dose propofol   Consults:    Procedures:  8/1 ETT  Significant Diagnostic Tests:  8/1 CT head>> No acute intracranial abnormality.  Stable areas of encephalomalacia in the right frontal lobe and anterior right temporal pole as well as sequela of prior lacunar infarcts in the bilateral basal ganglia.  8/1 CXR>> Mild diffuse increased interstitial markings throughout both lungs which could be due to interstitial edema and/or infectious etiology.  8/2 EEG> generalized epileptogenicity, diffuse encephalopathy concerning for anoxic brain injury.   8/2 ECHO> LVEF 05-69% Grade I diastolic dysfunction. Mildly reduced RV systolic function. S/p Aortic valve relacement   Micro Data:  See micro tab  Antimicrobials:  See fever tab  Interim history/subjective:  No events, remains poorly responsive on vent.  Objective   Blood pressure (!) 98/57, pulse 98, temperature 98.2 F (36.8 C), resp. rate (!) 28, height 5\' 8"  (1.727 m), weight 63.5 kg, SpO2 100 %.    Vent Mode: SIMV;Volume support;PSV FiO2 (%):  [30 %] 30 % Set Rate:  [5 bmp] 5 bmp Vt Set:  [500 mL] 500 mL PEEP:  [5 cmH20] 5 cmH20 Pressure Support:  [12 cmH20] 12 cmH20 Plateau Pressure:  [12 cmH20] 12 cmH20   Intake/Output Summary (Last 24 hours) at 04/19/2020 0938 Last data filed at April 19, 2020 0536 Gross per 24 hour  Intake 976.84 ml  Output 1350 ml  Net -373.16 ml   Filed Weights   03/29/20 0500 03/30/20 0422 28-Apr-2020 0500  Weight: 64.6 kg 65 kg 63.5 kg   Physical exam:  Gen: frail elderly man on vent CV: RRR, ext warm PULM: less secretions today, lungs with rhonci GI: soft, +BS Extremities: warm/dry, no edema, generalized ecchymosis Skin: multiple areas of skin breakdown and psoriatic type lesions,  bruising Neuro: Pupils reactive No blink reaction to threat +corneals +dolls GCS 3 + cough/gag and vent triggering  No new labs  Resolved Hospital Problem list     Assessment & Plan:   S/p witnessed OOH PEA Arrest Anoxic brain injury- confirmed on imaging Respiratory failure due to cardiac arrest UTI with staph aureus and enterobacter cloacae  History of CAD s/p CABG x3, NSVT, severe AS s/p TAVR 04/2019, HFpEF  AKI on CKD stage III, improving  Severe protein calorie malnutrition present on admission Frail elderly  - Continue supportive care - 7 days zosyn - Son coming this evening, no escalation of care, plan for allowing Raymond Swanson to pass in peace once family is here  Building surveyor:  Diet: EN Pain/Anxiety/Delirium protocol (if indicated): Propofol, fentanyl for appearance of comfort VAP protocol (if indicated): HOB 30 degrees, suction as needed DVT prophylaxis: SCDs GI prophylaxis: PPI Glucose control: SSI Mobility: Bedrest Code Status: DNR Family Communication: discussed with son who is en route from [place] Disposition: ICU  32 min cc time Erskine Emery MD PCCM

## 2020-04-24 NOTE — Progress Notes (Signed)
CDS updated and given TOD of 2100, Not suitable for donations. Referral # [id number]

## 2020-04-24 NOTE — Death Summary Note (Signed)
DEATH SUMMARY   Patient Details  Name: Raymond Swanson MRN: [id number] DOB: [birth year]  Admission/Discharge Information   Admit Date:  04/10/2020  Date of Death: Date of Death: Apr 17, 2020  Time of Death: Time of Death: 11/30/2098  Length of Stay: 15-Nov-2022  Referring Physician: Isaac Bliss, Rayford Halsted, MD   Reason(s) for Hospitalization  Out of hospital cardiac arrest Severe protein calore malnutrition Frail elderly  Diagnoses  Preliminary cause of death:  Secondary Diagnoses (including complications and co-morbidities):  Active Problems:   Cardiac arrest (HCC)   Respiratory insufficiency   Protein-calorie malnutrition, severe   Brief Hospital Course (including significant findings, care, treatment, and services provided and events leading to death)  Raymond Swanson is a 86 y.o. year old male who presented after out of hospital cardiac arrest.  He was kept on normothermia protocol but unfortunately developed severe anoxic brain injury.  Family ultimately decided to allow him to pass in peace without further life support.    Pertinent Labs and Studies  Significant Diagnostic Studies DG Chest 1 View  Result Date: 03/26/2020 CLINICAL DATA:  Intubation. EXAM: CHEST  1 VIEW COMPARISON:  2020/04/10. FINDINGS: Endotracheal tube and NG tube in stable position. Prior CABG. Prior cardiac valve replacement. Progressive diffuse bilateral interstitial prominence consistent with interstitial edema. Pneumonitis cannot be excluded. Tiny left pleural effusion. No pneumothorax. IMPRESSION: 1. Lines and tubes in stable position. Prior CABG. Prior cardiac valve replacement. Stable cardiomegaly. 2. Diffuse bilateral interstitial prominence, progressed from prior exam. Findings consistent interstitial edema. Pneumonitis cannot be excluded. Tiny left pleural effusion. Electronically Signed   By: Marcello Moores  Register   On: 03/26/2020 06:51   DG Chest 1 View  Result Date: 03/18/2020 CLINICAL DATA:  Multiple falls.   Hypertension. EXAM: CHEST  1 VIEW COMPARISON:  05/16/2019 FINDINGS: Numerous leads and wires project over the chest. Patient rotated left. Moderate cardiomegaly. Prior median sternotomy. Ascending aortic stent graft. No pleural effusion or pneumothorax. Chronic pulmonary interstitial thickening. Mild atelectasis at both lung bases, similar. Hyperinflation. IMPRESSION: No significant change since 05/16/2019 Hyperinflation consistent with COPD/chronic bronchitis. Bibasilar airspace disease, likely atelectasis. Aortic Atherosclerosis (ICD10-I70.0). Electronically Signed   By: Abigail Miyamoto M.D.   On: 03/18/2020 16:56   CT Head Wo Contrast  Result Date: 10-Apr-2020 CLINICAL DATA:  Altered mental status, post CPR, found on floor bathroom EXAM: CT HEAD WITHOUT CONTRAST TECHNIQUE: Contiguous axial images were obtained from the base of the skull through the vertex without intravenous contrast. COMPARISON:  CT 03/18/2020 FINDINGS: Brain: Stable areas of encephalomalacia in the right frontal lobe and anterior right temporal pole. Additional stable hypoattenuating foci in the bilateral basal ganglia likely reflecting sequela of remote lacunes. No new CT evident areas of acute infarction. No evidence of acute infarction, hemorrhage, hydrocephalus, extra-axial collection or mass lesion/mass effect. Symmetric prominence of the ventricles, cisterns and sulci compatible with parenchymal volume loss. Patchy areas of white matter hypoattenuation are most compatible with chronic microvascular angiopathy. Vascular: Atherosclerotic calcification of the carotid siphons and intradural vertebral arteries. No hyperdense vessel. Skull: No calvarial fracture or suspicious osseous lesion. No scalp swelling or hematoma. Sinuses/Orbits: Paranasal sinuses and mastoid air cells are predominantly clear. Orbital structures are unremarkable aside from prior lens extractions. Other: Endotracheal and transesophageal tubes are in place best visualized  on scout imaging. Dental bridge noted as well. IMPRESSION: 1. No acute intracranial abnormality. 2. Stable areas of encephalomalacia in the right frontal lobe and anterior right temporal pole as well as sequela of prior lacunar infarcts  in the bilateral basal ganglia. 3. Stable parenchymal volume loss and chronic microvascular angiopathy. 4. Endotracheal and transesophageal tubes in place. Electronically Signed   By: Lovena Le M.D.   On: 03/27/2020 21:25   CT Head Wo Contrast  Result Date: 03/18/2020 CLINICAL DATA:  Ataxia EXAM: CT HEAD WITHOUT CONTRAST TECHNIQUE: Contiguous axial images were obtained from the base of the skull through the vertex without intravenous contrast. COMPARISON:  08/06/2019 head CT. FINDINGS: Brain: Sequela of remote right frontal insult. Confluent hypodense foci involving the periventricular and deep white matter are unchanged. These are nonspecific however commonly associated with chronic microvascular ischemic changes. No new focal hypodensity to suggest acute infarct. No intracranial hemorrhage. Diffuse parenchymal volume loss with ex vacuo dilatation. No midline shift or mass lesion. No extra-axial fluid collection. Vascular: No hyperdense vessel. Bilateral carotid siphon and V4 segment atherosclerotic calcifications. Skull: Negative for fracture or focal lesion. Sinuses/Orbits: Normal orbits. Clear paranasal sinuses. No mastoid effusion. Other: None. IMPRESSION: No acute intracranial process. Remote right frontal insult. Global cerebral atrophy. Advanced chronic microvascular ischemic changes. Electronically Signed   By: Primitivo Gauze M.D.   On: 03/18/2020 15:49   CT Angio Chest PE W and/or Wo Contrast  Result Date: 03/18/2020 CLINICAL DATA:  86 year old male with chest pain and shortness of breath. EXAM: CT ANGIOGRAPHY CHEST WITH CONTRAST TECHNIQUE: Multidetector CT imaging of the chest was performed using the standard protocol during bolus administration of  intravenous contrast. Multiplanar CT image reconstructions and MIPs were obtained to evaluate the vascular anatomy. CONTRAST:  127mL OMNIPAQUE IOHEXOL 350 MG/ML SOLN COMPARISON:  Chest radiograph dated 03/18/2020 and chest CT dated 05/10/2019. FINDINGS: Evaluation of this exam is limited due to respiratory motion artifact. Cardiovascular: Top-normal cardiac size. No pericardial effusion. There is coronary vascular calcification and postsurgical changes of TAVR. There is advanced atherosclerotic calcification of the thoracic aorta. Evaluation of the aorta is however limited due to suboptimal opacification and timing of the contrast. No pulmonary artery embolus identified. Mediastinum/Nodes: No hilar or mediastinal adenopathy. The esophagus is grossly unremarkable. No mediastinal fluid collection. Lungs/Pleura: Background of moderate to severe emphysema. There are diffuse interstitial coarsening. Trace bilateral pleural effusions noted. No consolidative changes. There is no pneumothorax. The central airways are patent. Upper Abdomen: There is colonic diverticulosis. Partially visualized mild bilateral hydronephrosis. Musculoskeletal: There is loss of subcutaneous fat and cachexia. Osteopenia with advanced degenerative changes of the spine. Multiple old compression fractures involving T8, T11, superior endplate of I37, and L1. Approximately 4 mm retropulsion of the T11 posterior cortex similar to prior CT. Median sternotomy wires. Multiple old healed left posterior rib fractures. There is fracture of the inferior aspect of the right scapula which is new since the prior CT. Review of the MIP images confirms the above findings. IMPRESSION: 1. No CT evidence of pulmonary artery embolus. 2. Emphysema with trace bilateral pleural effusions. No focal consolidation or pneumothorax. 3. Fracture of the inferior aspect of the right scapula, new since the prior CT. 4. Multiple old compression fractures of the thoracic and lumbar  spine. 5. Partially visualized mild bilateral hydronephrosis. 6. Colonic diverticulosis. 7. Aortic Atherosclerosis (ICD10-I70.0) and Emphysema (ICD10-J43.9). Electronically Signed   By: Anner Crete M.D.   On: 03/18/2020 17:47   MR BRAIN WO CONTRAST  Result Date: 03/27/2020 CLINICAL DATA:  Provided history: Anoxic brain damage. Additional history obtained from prior radiology records: Patient found down with subsequent resuscitation. EXAM: [name] HEAD WITHOUT CONTRAST TECHNIQUE: Multiplanar, multiecho pulse sequences of the brain and surrounding  structures were obtained without intravenous contrast. COMPARISON:  Head CT 04/09/2020 FINDINGS: Brain: Mild intermittent motion degradation. There is a 9 mm focus of restricted diffusion within the superior left cerebellum consistent with acute/early subacute infarct. Additionally, there is subtle patchy cortical diffusion weighted signal abnormality within the bilateral cerebral hemispheres, likely reflecting sequela of hypoxic/ischemic injury given the provided history. These findings are most notable within the high right frontal lobe (series 3, image 44) and bilateral parietooccipital lobes (for instance as seen on series 3, image 29). Stable moderate generalized parenchymal atrophy. Redemonstrated chronic posttraumatic encephalomalacia within the anteroinferior right frontal lobe and anterolateral right temporal lobe with associated hemosiderin staining. Stable moderate/advanced multifocal T2/FLAIR hyperintensity within the cerebral white matter which is nonspecific, but consistent with chronic small vessel ischemic disease. No evidence of intracranial mass. No extra-axial fluid collection. No midline shift. Vascular: Expected proximal arterial flow voids. Skull and upper cervical spine: No focal marrow lesion. Sinuses/Orbits: Visualized orbits show no acute finding. Mild ethmoid sinus mucosal thickening. Small bilateral mastoid effusions. IMPRESSION: Subtle  multifocal cortical diffusion weighted signal abnormality, most notably affecting the high right frontal lobe and bilateral parietooccipital lobes. These findings likely reflect sequela of hypoxic/ischemic injury, although a component of watershed ischemia cannot be excluded. 9 mm acute/early subacute infarct within the left cerebellum. Redemonstrated chronic posttraumatic encephalomalacia within the anteroinferior right frontal lobe and anterolateral right temporal lobe. Stable moderate generalized parenchymal atrophy and moderate/advanced chronic small vessel ischemic disease. Mild ethmoid sinus mucosal thickening. Electronically Signed   By: Kellie Simmering DO   On: 03/27/2020 17:16   DG Chest Port 1 View  Result Date: 04/04/2020 CLINICAL DATA:  Post CPR EXAM: PORTABLE CHEST 1 VIEW COMPARISON:  March 18, 2020 FINDINGS: The heart size and mediastinal contours are unchanged with mild cardiomegaly. Aortic knob calcifications are seen. Prosthetic aortic valve is seen. Overlying median sternotomy wires are present. ET tube is seen 2 cm above the carina. NG tube is seen coursing below the diaphragm. There are mildly increased interstitial markings seen throughout both lungs, more prominent than the prior exam. There is a probable small right pleural effusion. Emphysematous changes are seen at both lung apices. IMPRESSION: Mild diffuse increased interstitial markings throughout both lungs which could be due to interstitial edema and/or infectious etiology. Trace right pleural effusion ETT and NG tube in satisfactory position Electronically Signed   By: Prudencio Pair M.D.   On: 04/11/2020 20:01   EEG adult  Result Date: 03/25/2020 Lora Havens, MD     03/25/2020 10:09 AM Patient Name: Raymond Swanson MRN: [id number] Epilepsy Attending: Lora Havens Referring Physician/Provider: Elease Etienne, PA Date: 03/25/2020 Duration: 24.38 minutes Patient history: 86 year old male status post cardiac arrest.  EEG to evaluate for  seizures. Level of alertness: comatose AEDs during EEG study: None Technical aspects: This EEG study was done with scalp electrodes positioned according to the 10-20 International system of electrode placement. Electrical activity was acquired at a sampling rate of 500Hz  and reviewed with a high frequency filter of 70Hz  and a low frequency filter of 1Hz . EEG data were recorded continuously and digitally stored. Description: EEG showed intermittent generalized periodic epileptiform discharges with triphasic morphology at 0.5 to 1 Hz as well as background suppression lasting 2 to 4 seconds.  EEG was reactive to noxious stimuli.  Hyperventilation and photic stimulation were not performed.   ABNORMALITY -Periodic epileptiform discharges with triphasic morphology, generalized -Background suppression, generalized IMPRESSION: This study showed evidence of generalized epileptogenic city as  well as profound diffuse encephalopathy, nonspecific to etiology but likely related to anoxic/hypoxic brain injury, sedation.  No seizures were seen during the study. Lora Havens   ECHOCARDIOGRAM COMPLETE  Result Date: 03/25/2020    ECHOCARDIOGRAM REPORT   Patient Name:   Raymond Swanson Date of Exam: 03/25/2020 Medical Rec #:  [id number]      Height:       68.0 in Accession #:    [id number]     Weight:       122.8 lb Date of Birth:  1924-12-31      BSA:          1.661 m Patient Age:    56 years       BP:           113/58 mmHg Patient Gender: M              HR:           67 bpm. Exam Location:  Inpatient Procedure: 2D Echo, Intracardiac Opacification Agent, Cardiac Doppler and Color            Doppler Indications:    Cardiac arrest  History:        Patient has prior history of Echocardiogram examinations. Prior                 CABG, Aortic Valve Disease; Risk Factors:Dyslipidemia and Former                 Smoker. 19mm Edwards Sapien bioprosthetic, stented aortic valve                 (TAVR).                 Aortic Valve: 29 mm  Edwards Sapien prosthetic, stented (TAVR)                 valve is present in the aortic position. Procedure Date:                 05/23/2019.  Sonographer:    Clayton Lefort RDCS (AE) Referring Phys: [id number] Francesca Jewett  Sonographer Comments: Technically challenging study due to limited acoustic windows, Technically difficult study due to poor echo windows, suboptimal parasternal window, suboptimal apical window, no subcostal window and echo performed with patient supine  and on artificial respirator. IMPRESSIONS  1. Left ventricular ejection fraction, by estimation, is 60 to 65%. The left ventricle has normal function. The left ventricle has no regional wall motion abnormalities. Left ventricular diastolic parameters are consistent with Grade I diastolic dysfunction (impaired relaxation).  2. Right ventricular systolic function is mildly reduced. The right ventricular size is normal.  3. The mitral valve is grossly normal. Trivial mitral valve regurgitation.  4. The aortic valve has been repaired/replaced. Aortic valve regurgitation No valvular or paravalvular regurgitation. There is a 29 mm Edwards Sapien prosthetic (TAVR) valve present in the aortic position. Procedure Date: 05/23/2019. Echo findings are consistent with normal structure and function of the aortic valve prosthesis. Aortic valve mean gradient measures 5.0 mmHg. FINDINGS  Left Ventricle: Left ventricular ejection fraction, by estimation, is 60 to 65%. The left ventricle has normal function. The left ventricle has no regional wall motion abnormalities. Definity contrast agent was given IV to delineate the left ventricular  endocardial borders. The left ventricular internal cavity size was normal in size. There is no left ventricular hypertrophy. Left ventricular diastolic parameters are consistent with Grade I diastolic dysfunction (impaired relaxation). Right  Ventricle: The right ventricular size is normal. Right vetricular wall thickness was not  assessed. Right ventricular systolic function is mildly reduced. Left Atrium: Left atrial size was normal in size. Right Atrium: Right atrial size was normal in size. Pericardium: There is no evidence of pericardial effusion. Mitral Valve: The mitral valve is grossly normal. Trivial mitral valve regurgitation. MV peak gradient, 5.1 mmHg. The mean mitral valve gradient is 1.0 mmHg. Tricuspid Valve: The tricuspid valve is grossly normal. Tricuspid valve regurgitation is trivial. Aortic Valve: The aortic valve has been repaired/replaced. Aortic valve regurgitation No valvular or paravalvular regurgitation. Aortic valve mean gradient measures 5.0 mmHg. Aortic valve peak gradient measures 10.9 mmHg. Aortic valve area, by VTI measures 2.98 cm. There is a 29 mm Edwards Sapien prosthetic, stented (TAVR) valve present in the aortic position. Procedure Date: 05/23/2019. Echo findings are consistent with normal structure and function of the aortic valve prosthesis. Pulmonic Valve: The pulmonic valve was not well visualized. Pulmonic valve regurgitation is trivial. Aorta: The aortic root is normal in size and structure. Venous: The inferior vena cava was not well visualized. IAS/Shunts: The interatrial septum was not well visualized.  LEFT VENTRICLE PLAX 2D LVIDd:         4.40 cm  Diastology LVIDs:         3.10 cm  LV e' lateral:   8.27 cm/s LV PW:         1.20 cm  LV E/e' lateral: 8.0 LV IVS:        1.00 cm  LV e' medial:    5.87 cm/s LVOT diam:     2.90 cm  LV E/e' medial:  11.3 LV SV:         83 LV SV Index:   50 LVOT Area:     6.61 cm  RIGHT VENTRICLE RV Basal diam:  2.90 cm RV S prime:     9.46 cm/s TAPSE (M-mode): 1.7 cm LEFT ATRIUM             Index       RIGHT ATRIUM           Index LA diam:        3.70 cm 2.23 cm/m  RA Area:     16.20 cm LA Vol (A2C):   52.4 ml 31.55 ml/m RA Volume:   42.20 ml  25.41 ml/m LA Vol (A4C):   26.5 ml 15.95 ml/m LA Biplane Vol: 37.9 ml 22.82 ml/m  AORTIC VALVE AV Area (Vmax):    3.40  cm AV Area (Vmean):   3.87 cm AV Area (VTI):     2.98 cm AV Vmax:           165.00 cm/s AV Vmean:          95.100 cm/s AV VTI:            0.277 m AV Peak Grad:      10.9 mmHg AV Mean Grad:      5.0 mmHg LVOT Vmax:         84.90 cm/s LVOT Vmean:        55.700 cm/s LVOT VTI:          0.125 m LVOT/AV VTI ratio: 0.45  AORTA Ao Root diam: 3.10 cm Ao Asc diam:  3.00 cm MITRAL VALVE                TRICUSPID VALVE MV Area (PHT): 1.98 cm     TR Peak grad:   55.7  mmHg MV Peak grad:  5.1 mmHg     TR Vmax:        373.00 cm/s MV Mean grad:  1.0 mmHg MV Vmax:       1.13 m/s     SHUNTS MV Vmean:      56.5 cm/s    Systemic VTI:  0.12 m MV Decel Time: 384 msec     Systemic Diam: 2.90 cm MV E velocity: 66.20 cm/s MV A velocity: 109.00 cm/s MV E/A ratio:  0.61 Cherlynn Kaiser MD Electronically signed by Cherlynn Kaiser MD Signature Date/Time: 03/25/2020/10:04:09 PM    Final    VAS [place] LOWER EXTREMITY VENOUS (DVT)  Result Date: 03/25/2020  Lower Venous DVTStudy Indications: Edema.  Limitations: Poor ultrasound/tissue interface, temperature wraps, patient position. Comparison Study: No prior study Performing Technologist: Maudry Mayhew MHA, RDMS, RVT, RDCS  Examination Guidelines: A complete evaluation includes B-mode imaging, spectral Doppler, color Doppler, and power Doppler as needed of all accessible portions of each vessel. Bilateral testing is considered an integral part of a complete examination. Limited examinations for reoccurring indications may be performed as noted. The reflux portion of the exam is performed with the patient in reverse Trendelenburg.  +---------+---------------+---------+-----------+----------+--------------+ RIGHT    CompressibilityPhasicitySpontaneityPropertiesThrombus Aging +---------+---------------+---------+-----------+----------+--------------+ FV DistalFull                    Yes                                  +---------+---------------+---------+-----------+----------+--------------+ PTV      Full                    Yes                                 +---------+---------------+---------+-----------+----------+--------------+ PERO     Full                    Yes                                 +---------+---------------+---------+-----------+----------+--------------+   Right Technical Findings: Not visualized segments include SFJ, CFV, FV proximal and mid, PFV, popliteal vein.  +---------+---------------+---------+-----------+----------+--------------+ LEFT     CompressibilityPhasicitySpontaneityPropertiesThrombus Aging +---------+---------------+---------+-----------+----------+--------------+ FV DistalFull                    Yes                                 +---------+---------------+---------+-----------+----------+--------------+   Left Technical Findings: Not visualized segments include SFJ, CFV, FV proximal and mid, PFV, Popliteal vein, PTV, peroneal veins.   Summary: RIGHT: - There is no evidence of deep vein thrombosis in the lower extremity. However, portions of this examination were limited- see technologist comments above.  - No cystic structure found in the popliteal fossa.  LEFT: - Left lower extremity venous evaluation was grossly inconclusive secondary to technical limitations. No DVT noted in distal left femoral vein.  *See table(s) above for measurements and observations. Electronically signed by Ruta Hinds MD on 03/25/2020 at 3:41:26 PM.    Final     Microbiology No results found for this or any previous visit (from the past 240 hour(s)).  Lab Basic  Metabolic Panel: Recent Labs  Lab 03/29/20 0834 03/30/20 0048  NA 140 141  K 4.2 5.0  CL 106 107  CO2 25 25  GLUCOSE 152* 127*  BUN 27* 29*  CREATININE 1.01 1.01  CALCIUM 8.1* 8.0*  MG 2.2 2.1  PHOS 2.9 3.2   Liver Function Tests: No results for input(s): AST, ALT, ALKPHOS, BILITOT, PROT, ALBUMIN in  the last 168 hours. No results for input(s): LIPASE, AMYLASE in the last 168 hours. No results for input(s): AMMONIA in the last 168 hours. CBC: Recent Labs  Lab 03/29/20 0834 03/30/20 0325  WBC 9.4 9.3  HGB 9.1* 8.2*  HCT 31.5* 28.3*  MCV 89.0 90.4  PLT 195 167   Cardiac Enzymes: No results for input(s): CKTOTAL, CKMB, CKMBINDEX, TROPONINI in the last 168 hours. Sepsis Labs: Recent Labs  Lab 03/29/20 0834 03/30/20 0325  WBC 9.4 9.3     Candee Furbish 04/04/2020, 10:21 AM

## 2020-04-24 DEATH — deceased

## 2021-01-28 IMAGING — CT CT CERVICAL SPINE W/O CM
3 of 4 series · 14 of 33 positions shown, 17 images · non-contrast
Comparison: Head CT 01/22/2016

CLINICAL DATA: [AGE] post unwitnessed fall. On
anticoagulation. History of subdural hematoma.

EXAM:
CT HEAD WITHOUT CONTRAST
CT CERVICAL SPINE WITHOUT CONTRAST
TECHNIQUE: Multidetector CT imaging of the head and cervical spine was
performed following the standard protocol without intravenous
contrast. Multiplanar CT image reconstructions of the cervical spine
were also generated.

[Series 5: c_spine 1.0 st thins · axial · 0.34mm/px · z∈[-283,-155]mm · 6 of 236 slices shown, 8 images]
[im 27/236  soft-tissue]
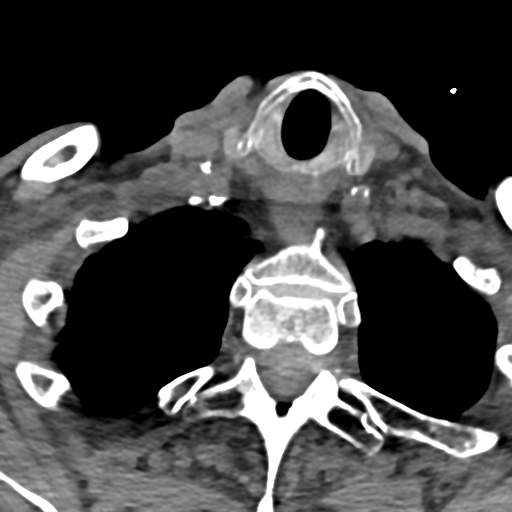
[im 27/236  bone]
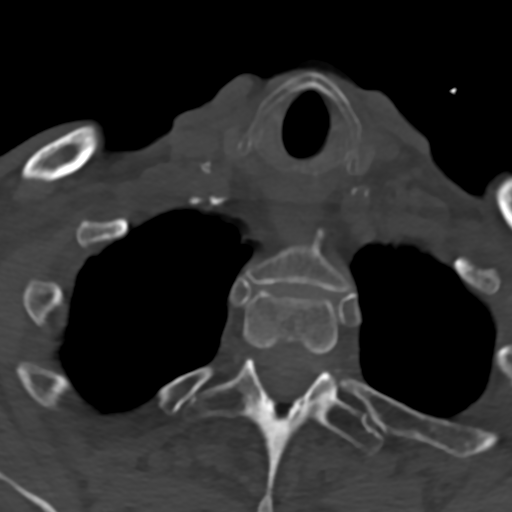
[im 79/236  bone]
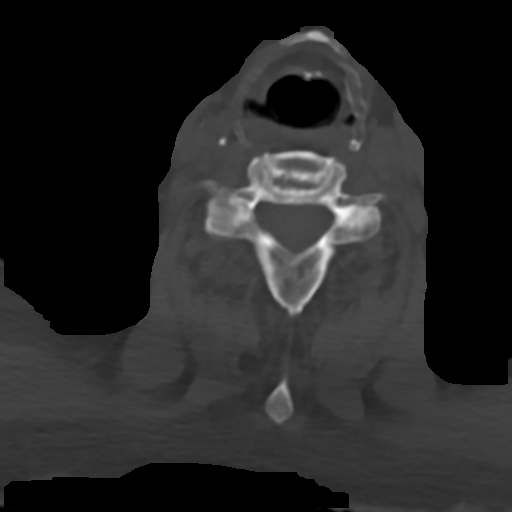
[im 105/236  bone]
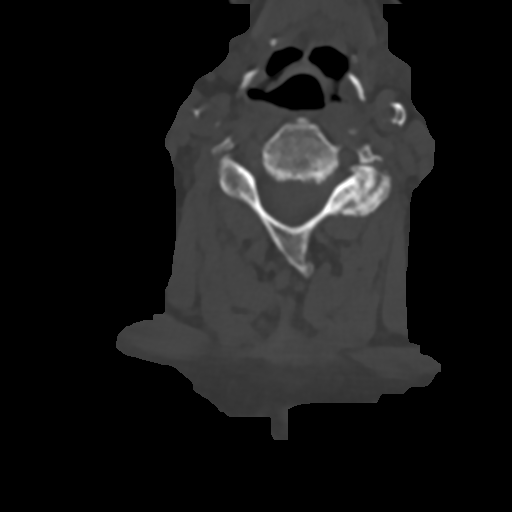
[im 131/236  bone]
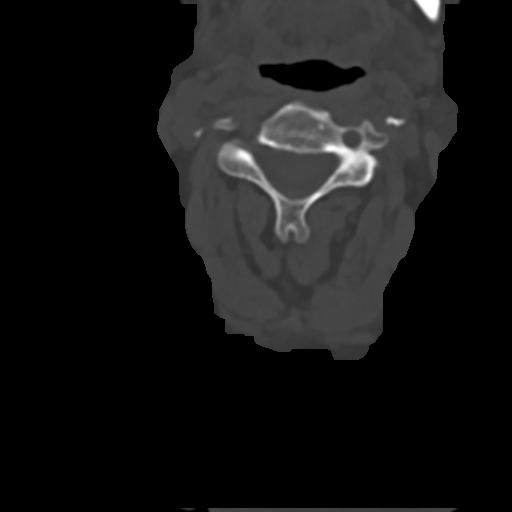
[im 183/236  soft-tissue]
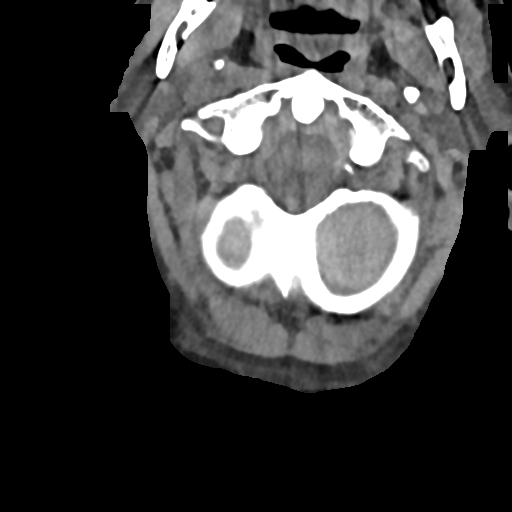
[im 183/236  bone]
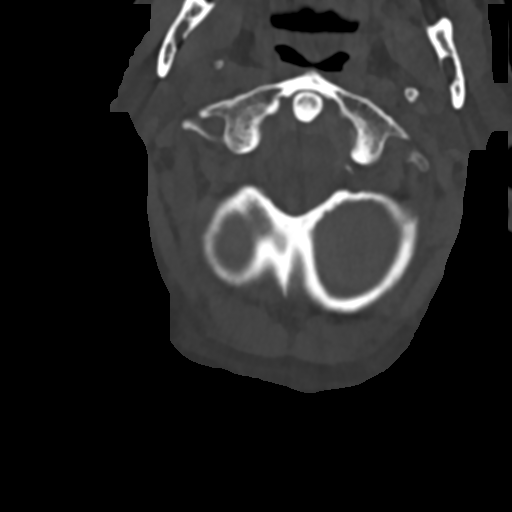
[im 209/236  bone]
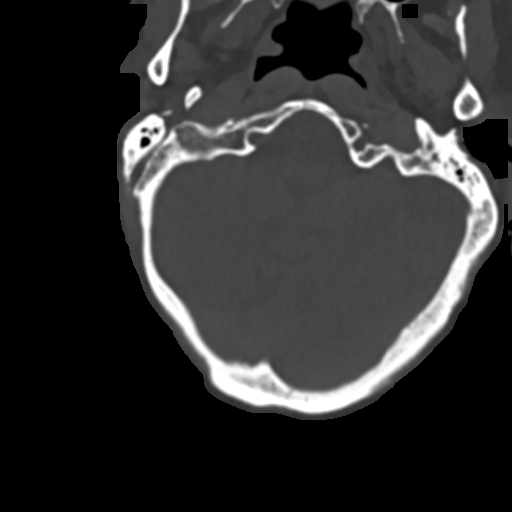

[Series 7: c_spine 2.0 sag bone · sagittal · 0.31mm/px · 5 of 61 slices shown, 6 images]
[im 21/61  bone]
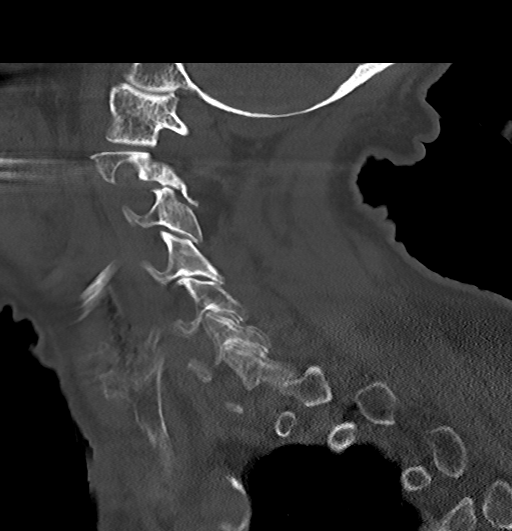
[im 26/61  bone]
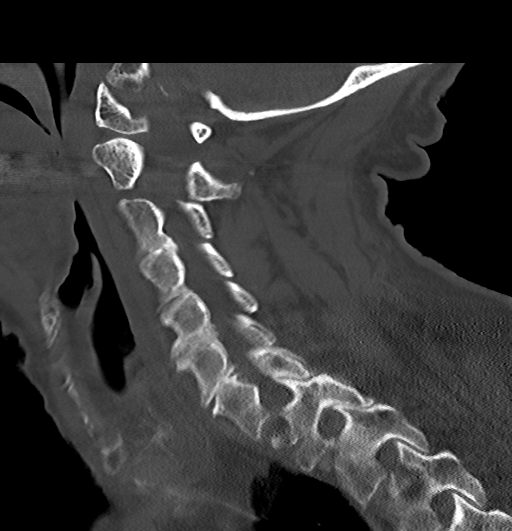
[im 31/61  soft-tissue]
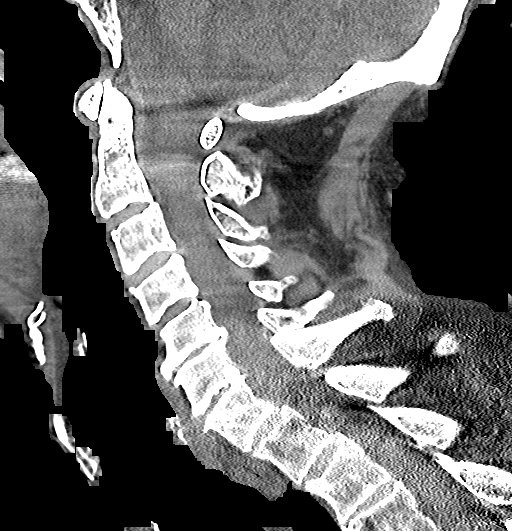
[im 31/61  bone]
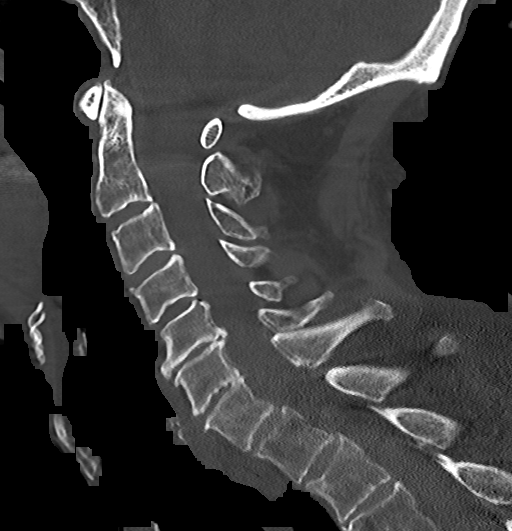
[im 36/61  bone]
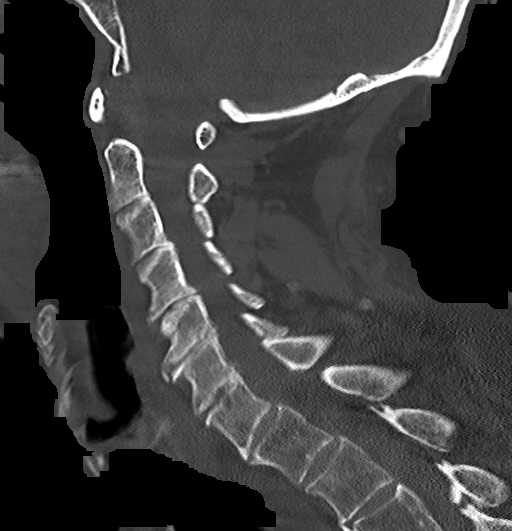
[im 41/61  bone]
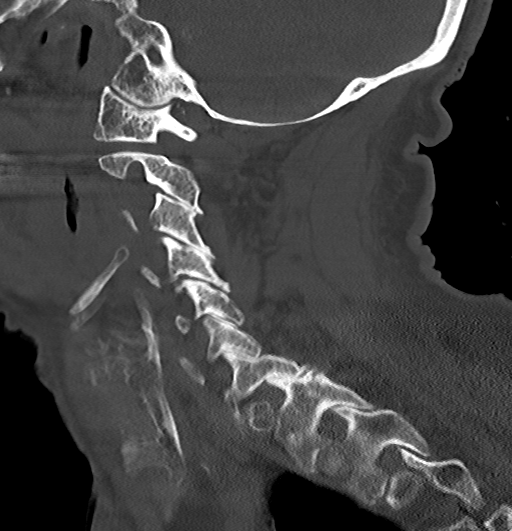

[Series 8: c_spine 2.0 cor bone · coronal · 0.24mm/px · 3 of 64 slices shown]
[im 13/64  bone]
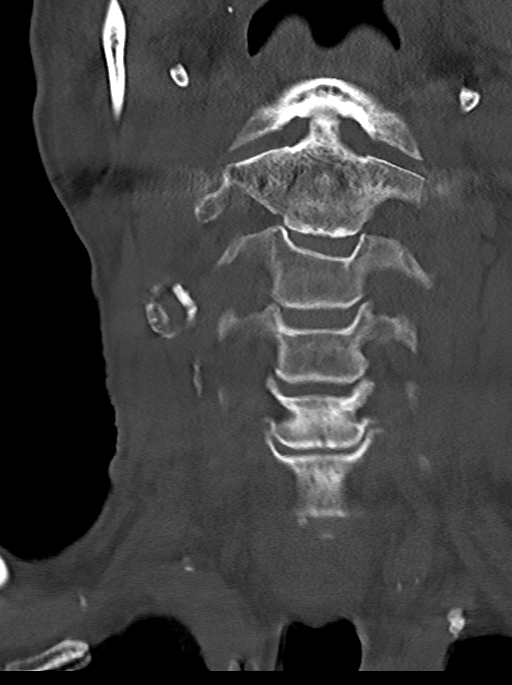
[im 26/64  bone]
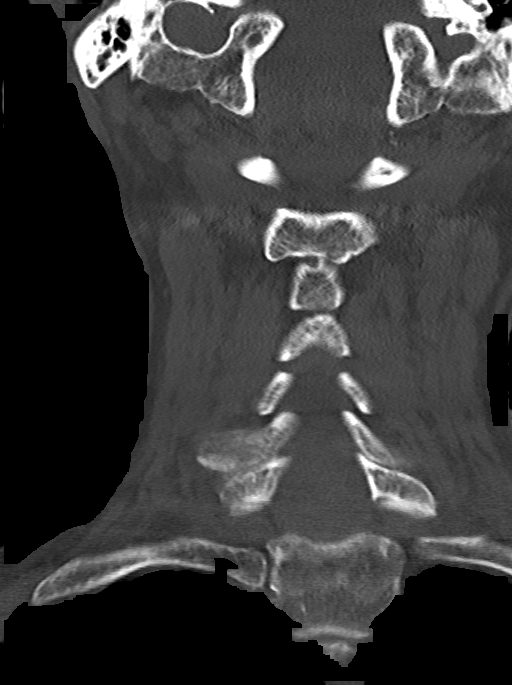
[im 38/64  bone]
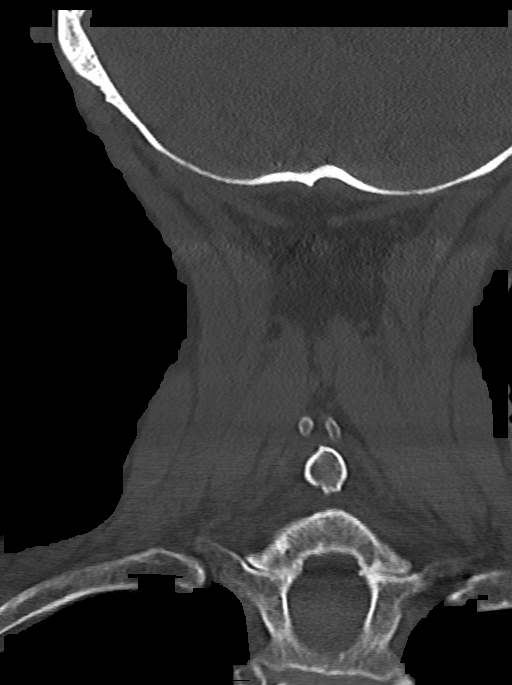

[14 of 33 positions shown; findings below may reference images not displayed]

FINDINGS: CT HEAD FINDINGS

Brain: No intracranial hemorrhage, mass effect, or midline shift.
Age related atrophy. Moderate chronic small vessel ischemia. Right
frontal encephalomalacia in the region of prior hemorrhage. No
hydrocephalus. The basilar cisterns are patent. No evidence of
territorial infarct or acute ischemia. No extra-axial or
intracranial fluid collection.

Vascular: Atherosclerosis of skullbase vasculature without
hyperdense vessel or abnormal calcification.

Skull: No fracture or focal lesion.

Sinuses/Orbits: Paranasal sinuses and mastoid air cells are clear.
The visualized orbits are unremarkable. Bilateral cataract
resection.

Other: Posterior parietal scalp hematoma with overlying dressing in
place.

CT CERVICAL SPINE FINDINGS

Alignment: No traumatic subluxation. Trace anterolisthesis of C7 on
T1 appears degenerative.

Skull base and vertebrae: No acute fracture. Cervical vertebral body
heights are maintained. Minimal anterior wedging of T2 vertebral
body is chronic and unchanged from prior exam. The dens and skull
base are intact.

Soft tissues and spinal canal: No prevertebral fluid or swelling. No
visible canal hematoma.

Disc levels: Degenerative disc disease is most prominent at C5-C6.
Multilevel facet hypertrophy.

Upper chest: Emphysema. No acute findings.

Other: Advanced carotid calcifications.
IMPRESSION: 1. Posterior parietal scalp hematoma with overlying dressing in
place. No acute intracranial abnormality. No skull fracture.
2. Age related atrophy and chronic small vessel ischemia. Right
frontal encephalomalacia from prior hemorrhage.
3. Degenerative change in the cervical spine without acute fracture
or subluxation.
4. Carotid and skull base atherosclerosis. Emphysema noted in the
lung apices.

Emphysema ([id number]).
